# Patient Record
Sex: Male | Born: 1960 | Race: Black or African American | Hispanic: No | Smoking: Never smoker
Health system: Southern US, Community
[De-identification: ages and names within clinical notes are randomized; demographics above are authoritative.]

## PROBLEM LIST (undated history)

## (undated) DIAGNOSIS — I10 Essential (primary) hypertension: Secondary | ICD-10-CM

## (undated) DIAGNOSIS — I639 Cerebral infarction, unspecified: Secondary | ICD-10-CM

## (undated) DIAGNOSIS — F419 Anxiety disorder, unspecified: Secondary | ICD-10-CM

## (undated) DIAGNOSIS — R5381 Other malaise: Secondary | ICD-10-CM

## (undated) DIAGNOSIS — E782 Mixed hyperlipidemia: Secondary | ICD-10-CM

## (undated) DIAGNOSIS — N400 Enlarged prostate without lower urinary tract symptoms: Secondary | ICD-10-CM

## (undated) DIAGNOSIS — I619 Nontraumatic intracerebral hemorrhage, unspecified: Secondary | ICD-10-CM

## (undated) DIAGNOSIS — F0150 Vascular dementia without behavioral disturbance: Secondary | ICD-10-CM

## (undated) DIAGNOSIS — K6289 Other specified diseases of anus and rectum: Secondary | ICD-10-CM

## (undated) DIAGNOSIS — U071 COVID-19: Secondary | ICD-10-CM

## (undated) DIAGNOSIS — I81 Portal vein thrombosis: Secondary | ICD-10-CM

## (undated) DIAGNOSIS — F331 Major depressive disorder, recurrent, moderate: Secondary | ICD-10-CM

---

## 2018-09-07 ENCOUNTER — Emergency Department
Admission: EM | Admit: 2018-09-07 | Discharge: 2018-09-08 | Disposition: A | Discharge status: Home / Self Care | Payer: 59 | Attending: Emergency Medicine | Admitting: Emergency Medicine

## 2018-09-07 ENCOUNTER — Emergency Department: Payer: 59

## 2018-09-07 ENCOUNTER — Other Ambulatory Visit: Payer: Self-pay

## 2018-09-07 DIAGNOSIS — Y998 Other external cause status: Secondary | ICD-10-CM | POA: Diagnosis not present

## 2018-09-07 DIAGNOSIS — Y9301 Activity, walking, marching and hiking: Secondary | ICD-10-CM | POA: Insufficient documentation

## 2018-09-07 DIAGNOSIS — R55 Syncope and collapse: Secondary | ICD-10-CM | POA: Diagnosis not present

## 2018-09-07 DIAGNOSIS — W19XXXA Unspecified fall, initial encounter: Secondary | ICD-10-CM | POA: Diagnosis not present

## 2018-09-07 DIAGNOSIS — Y92010 Kitchen of single-family (private) house as the place of occurrence of the external cause: Secondary | ICD-10-CM | POA: Diagnosis not present

## 2018-09-07 LAB — CBC WITH DIFFERENTIAL/PLATELET
ABS IMMATURE GRANULOCYTES: 0.02 10*3/uL (ref 0.00–0.07)
BASOS PCT: 1 %
Basophils Absolute: 0.1 10*3/uL (ref 0.0–0.1)
Eosinophils Absolute: 0.7 10*3/uL — ABNORMAL HIGH (ref 0.0–0.5)
Eosinophils Relative: 10 %
HCT: 51.6 % (ref 39.0–52.0)
Hemoglobin: 17.6 g/dL — ABNORMAL HIGH (ref 13.0–17.0)
Immature Granulocytes: 0 %
LYMPHS ABS: 2.3 10*3/uL (ref 0.7–4.0)
Lymphocytes Relative: 36 %
MCH: 30.9 pg (ref 26.0–34.0)
MCHC: 34.1 g/dL (ref 30.0–36.0)
MCV: 90.7 fL (ref 80.0–100.0)
MONOS PCT: 7 %
Monocytes Absolute: 0.5 10*3/uL (ref 0.1–1.0)
NEUTROS PCT: 46 %
Neutro Abs: 3 10*3/uL (ref 1.7–7.7)
PLATELETS: 160 10*3/uL (ref 150–400)
RBC: 5.69 MIL/uL (ref 4.22–5.81)
RDW: 13.2 % (ref 11.5–15.5)
WBC: 6.4 10*3/uL (ref 4.0–10.5)
nRBC: 0 % (ref 0.0–0.2)

## 2018-09-07 LAB — COMPREHENSIVE METABOLIC PANEL
ALBUMIN: 3.6 g/dL (ref 3.5–5.0)
ALT: 25 U/L (ref 0–44)
ANION GAP: 7 (ref 5–15)
AST: 27 U/L (ref 15–41)
Alkaline Phosphatase: 63 U/L (ref 38–126)
BUN: 16 mg/dL (ref 6–20)
CHLORIDE: 106 mmol/L (ref 98–111)
CO2: 25 mmol/L (ref 22–32)
Calcium: 8.5 mg/dL — ABNORMAL LOW (ref 8.9–10.3)
Creatinine, Ser: 1.2 mg/dL (ref 0.61–1.24)
GFR calc Af Amer: >60 mL/min (ref >60)
GFR calc non Af Amer: >60 mL/min (ref >60)
GLUCOSE: 179 mg/dL — AB (ref 70–99)
POTASSIUM: 3.6 mmol/L (ref 3.5–5.1)
SODIUM: 138 mmol/L (ref 135–145)
TOTAL PROTEIN: 7.1 g/dL (ref 6.5–8.1)
Total Bilirubin: 1.8 mg/dL — ABNORMAL HIGH (ref 0.3–1.2)

## 2018-09-07 LAB — TROPONIN I

## 2018-09-07 MED ORDER — SODIUM CHLORIDE 0.9 % IV BOLUS
1000.0000 mL | Freq: Once | INTRAVENOUS | Status: AC
  Administered 2018-09-07: 1000 mL via INTRAVENOUS

## 2018-09-07 NOTE — ED Provider Notes (Signed)
[facility] Emergency Department Provider Note   ____________________________________________   First MD Initiated Contact with Patient 09/07/18 2132     (approximate)  I have reviewed the triage vital signs and the nursing notes.   HISTORY  Chief Complaint Loss of Consciousness    HPI Maxwell Miller is a 57 y.o. male who has had strokes and has both long and short-term memory loss.  He took his high blood pressure medications and about half an hour later was going to the kitchen and passed out fell hit the ground.  Patient does not remember any of it.  Patient did scrape scratches lip but does not have a headache or neck pain.  His wife reports there was a very loud bang when he fell.  She reports that he fell after he got lightheaded last week about a half an hour after taking his blood pressure medications as well.  EMS reports patient was orthostatic when they saw him.   No past medical history on file. Past medical history significant for strokes and memory loss because of that. There are no active problems to display for this patient.     Prior to Admission medications   Not on File    Allergies Patient has no known allergies.  No family history on file.  Social History Social History   Tobacco Use  . Smoking status: Not on file  Substance Use Topics  . Alcohol use: Not on file  . Drug use: Not on file    Review of Systems  Constitutional: No fever/chills Eyes: No visual changes. ENT: No sore throat. Cardiovascular: Denies chest pain. Respiratory: Denies shortness of breath. Gastrointestinal: No abdominal pain.  No nausea, no vomiting.  No diarrhea.  No constipation. Genitourinary: Negative for dysuria. Musculoskeletal: Negative for back pain. Skin: Negative for rash. Neurological: Negative for headaches, focal weakness  ____________________________________________   PHYSICAL EXAM:  VITAL SIGNS: ED Triage Vitals  Enc  Vitals Group     BP 09/07/18 2126 128/87     Pulse Rate 09/07/18 2126 69     Resp 09/07/18 2126 18     Temp 09/07/18 2126 98.8 F (37.1 C)     Temp Source 09/07/18 2126 Oral     SpO2 09/07/18 2126 99 %     Weight 09/07/18 2128 190 lb (86.2 kg)     Height 09/07/18 2128 5\' 11"  (1.803 m)     Head Circumference --      Peak Flow --      Pain Score 09/07/18 2127 6     Pain Loc --      Pain Edu? --      Excl. in GC? --     Constitutional: Alert and oriented. Well appearing and in no acute distress. Eyes: Conjunctivae are normal. PER. EOMI. Head: Atraumatic except for scratch on the right side of his lower lip. Nose: No congestion/rhinnorhea. Mouth/Throat: Mucous membranes are moist.  Oropharynx non-erythematous. Neck: No stridor.  No cervical spine tenderness to palpation. Cardiovascular: Normal rate, regular rhythm. Grossly normal heart sounds.  Good peripheral circulation. Respiratory: Normal respiratory effort.  No retractions. Lungs CTAB. Gastrointestinal: Soft and nontender. No distention. No abdominal bruits. No CVA tenderness. Musculoskeletal: No lower extremity tenderness nor edema.   Neurologic:  Normal speech and language. No gross focal neurologic deficits are appreciated.  Skin:  Skin is warm, dry and intact. No rash noted. Psychiatric: Mood and affect are normal. Speech and behavior are normal.  ____________________________________________  LABS (all labs ordered are listed, but only abnormal results are displayed)  Labs Reviewed  COMPREHENSIVE METABOLIC PANEL - Abnormal; Notable for the following components:      Result Value   Glucose, Bld 179 (*)    Calcium 8.5 (*)    Total Bilirubin 1.8 (*)    All other components within normal limits  CBC WITH DIFFERENTIAL/PLATELET - Abnormal; Notable for the following components:   Hemoglobin 17.6 (*)    Eosinophils Absolute 0.7 (*)    All other components within normal limits  TROPONIN I    ____________________________________________  EKG  EKG read interpreted by me shows normal sinus rhythm rate of 67 normal axis diffuse T wave flattening in the limb leads there is T wave inversion in V4 5 and 6 ____________________________________________  RADIOLOGY  ED MD interpretation:   Official radiology report(s): Ct Head Wo Contrast  Result Date: 09/07/2018 CLINICAL DATA:  Syncopal episode at home EXAM: CT HEAD WITHOUT CONTRAST TECHNIQUE: Contiguous axial images were obtained from the base of the skull through the vertex without intravenous contrast. COMPARISON:  None. FINDINGS: Brain: Mild superficial atrophy with moderate likely chronic small vessel ischemic disease of periventricular white matter. Probable small CSF space in the right basal ganglia. No large vascular territory infarct, hemorrhage or midline shift. No intra-axial mass or extra-axial fluid collections. Midline fourth ventricle and basal cisterns without effacement. Vascular: No hyperdense vessel sign. Skull: Normal. Negative for fracture or focal lesion. Sinuses/Orbits: No acute finding. Other: None. IMPRESSION: Mild superficial atrophy for age. Moderate degree of likely chronic small vessel ischemic disease. No acute intracranial abnormality. Electronically Signed   By: Tollie Eth M.D.   On: 09/07/2018 22:47   Dg Chest Portable 1 View  Result Date: 09/07/2018 CLINICAL DATA:  57 year old male with syncope. EXAM: PORTABLE CHEST 1 VIEW COMPARISON:  None. FINDINGS: The lungs are clear. There is no pleural effusion or pneumothorax. Top-normal cardiac silhouette. No acute osseous pathology. IMPRESSION: No active disease. Electronically Signed   By: Elgie Collard M.D.   On: 09/07/2018 22:26    ____________________________________________   PROCEDURES  Procedure(s) performed:   Procedures  Critical Care performed:   ____________________________________________   INITIAL IMPRESSION / ASSESSMENT AND PLAN /  ED COURSE Patient lab work looks okay patient is better and not orthostatic anymore after IV fluids I believe that because of the time relationship between the syncope and previous lightheadedness and his medication dosage this is a side effect of his medications.  I will have him stop the Coreg for now call his doctor first thing in the morning to see if his doctor agrees.  Patient is otherwise doing well I will let him go home she will try to see his doctor tomorrow not take the Coreg and take his blood pressure.  Clinical Course as of Sep 08 109  Mon Sep 08, 2018  0019 Chloride: 106 [PM]    Clinical Course User Index [PM] Arnaldo Natal, MD     ____________________________________________   FINAL CLINICAL IMPRESSION(S) / ED DIAGNOSES  Final diagnoses:  Syncope and collapse     ED Discharge Orders    None       Note:  This document was prepared using Dragon voice recognition software and may include unintentional dictation errors.    Arnaldo Natal, MD 09/08/18 [phone]

## 2018-09-07 NOTE — ED Notes (Signed)
Report given to Stephen RN.

## 2018-09-07 NOTE — ED Triage Notes (Signed)
Per EMS report, patient had a syncopal episode at home when he stood up to go to kitchen per wife's report. Patient has an abrasion on lower lip. Patient has a history of CVA with only deficit is memory loss. EMS reports positive orthostatic vital signs, lying: 116/78, pulse 66. Sitting: 96/67, pulse 80. Unable to obtain standing vital signs due to patient feeling faint.

## 2018-09-08 NOTE — Discharge Instructions (Addendum)
It sounds like the blood pressure medicine is making your blood pressure drop too much and making you pass out.  For now please stop taking the Coreg.  Give your doctor a call first thing in the morning and have him check on you and see if he wants to continue not taking the Coreg or he wants to stop 1 of the other 2 medicines you are taking (the Norvasc or lisinopril).  Please return here if you have lightheadedness like this again.  Please be very careful standing up for the hour or 2 after you take your blood pressure medicine.

## 2019-01-20 ENCOUNTER — Encounter: Payer: Self-pay | Admitting: Emergency Medicine

## 2019-01-20 ENCOUNTER — Inpatient Hospital Stay
Admission: EM | Admit: 2019-01-20 | Discharge: 2019-04-01 | DRG: 871 | Disposition: A | Discharge status: Skilled Nursing Facility | Payer: Medicaid Other | Attending: Internal Medicine | Admitting: Internal Medicine

## 2019-01-20 ENCOUNTER — Other Ambulatory Visit: Payer: Self-pay

## 2019-01-20 ENCOUNTER — Emergency Department: Payer: Medicaid Other

## 2019-01-20 DIAGNOSIS — Z1159 Encounter for screening for other viral diseases: Secondary | ICD-10-CM | POA: Diagnosis not present

## 2019-01-20 DIAGNOSIS — I10 Essential (primary) hypertension: Secondary | ICD-10-CM | POA: Diagnosis present

## 2019-01-20 DIAGNOSIS — F0150 Vascular dementia without behavioral disturbance: Secondary | ICD-10-CM | POA: Diagnosis present

## 2019-01-20 DIAGNOSIS — Z6825 Body mass index (BMI) 25.0-25.9, adult: Secondary | ICD-10-CM

## 2019-01-20 DIAGNOSIS — I81 Portal vein thrombosis: Secondary | ICD-10-CM | POA: Diagnosis present

## 2019-01-20 DIAGNOSIS — E440 Moderate protein-calorie malnutrition: Secondary | ICD-10-CM

## 2019-01-20 DIAGNOSIS — Z8673 Personal history of transient ischemic attack (TIA), and cerebral infarction without residual deficits: Secondary | ICD-10-CM

## 2019-01-20 DIAGNOSIS — E785 Hyperlipidemia, unspecified: Secondary | ICD-10-CM | POA: Diagnosis present

## 2019-01-20 DIAGNOSIS — A419 Sepsis, unspecified organism: Secondary | ICD-10-CM | POA: Diagnosis present

## 2019-01-20 DIAGNOSIS — Z9119 Patient's noncompliance with other medical treatment and regimen: Secondary | ICD-10-CM | POA: Diagnosis not present

## 2019-01-20 DIAGNOSIS — A4151 Sepsis due to Escherichia coli [E. coli]: Principal | ICD-10-CM | POA: Diagnosis present

## 2019-01-20 DIAGNOSIS — G8311 Monoplegia of lower limb affecting right dominant side: Secondary | ICD-10-CM | POA: Diagnosis not present

## 2019-01-20 DIAGNOSIS — R4182 Altered mental status, unspecified: Secondary | ICD-10-CM | POA: Diagnosis present

## 2019-01-20 DIAGNOSIS — K6289 Other specified diseases of anus and rectum: Secondary | ICD-10-CM | POA: Diagnosis present

## 2019-01-20 DIAGNOSIS — F329 Major depressive disorder, single episode, unspecified: Secondary | ICD-10-CM | POA: Diagnosis not present

## 2019-01-20 HISTORY — DX: Essential (primary) hypertension: I10

## 2019-01-20 HISTORY — DX: Cerebral infarction, unspecified: I63.9

## 2019-01-20 LAB — PATHOLOGIST SMEAR REVIEW

## 2019-01-20 LAB — CBC WITH DIFFERENTIAL/PLATELET
ABS IMMATURE GRANULOCYTES: 1.83 10*3/uL — AB (ref 0.00–0.07)
Basophils Absolute: 0.1 10*3/uL (ref 0.0–0.1)
Basophils Relative: 0 %
Eosinophils Absolute: 0 10*3/uL (ref 0.0–0.5)
Eosinophils Relative: 0 %
HCT: 47 % (ref 39.0–52.0)
Hemoglobin: 16 g/dL (ref 13.0–17.0)
IMMATURE GRANULOCYTES: 6 %
Lymphocytes Relative: 4 %
Lymphs Abs: 1.1 10*3/uL (ref 0.7–4.0)
MCH: 29.5 pg (ref 26.0–34.0)
MCHC: 34 g/dL (ref 30.0–36.0)
MCV: 86.7 fL (ref 80.0–100.0)
MONOS PCT: 8 %
Monocytes Absolute: 2.4 10*3/uL — ABNORMAL HIGH (ref 0.1–1.0)
NEUTROS ABS: 23.8 10*3/uL — AB (ref 1.7–7.7)
NEUTROS PCT: 82 %
PLATELETS: 136 10*3/uL — AB (ref 150–400)
RBC: 5.42 MIL/uL (ref 4.22–5.81)
RDW: 13.7 % (ref 11.5–15.5)
Smear Review: NORMAL
WBC: 29.4 10*3/uL — ABNORMAL HIGH (ref 4.0–10.5)
nRBC: 0 % (ref 0.0–0.2)

## 2019-01-20 LAB — CSF CELL COUNT WITH DIFFERENTIAL
Eosinophils, CSF: 0 %
Lymphs, CSF: 46 %
Monocyte-Macrophage-Spinal Fluid: 10 %
RBC Count, CSF: 212 /mm3 — ABNORMAL HIGH (ref 0–3)
RBC Count, CSF: 3418 /mm3 — ABNORMAL HIGH (ref 0–3)
Segmented Neutrophils-CSF: 44 %
TUBE #: 1
Tube #: 4
WBC, CSF: 0 /mm3 (ref 0–5)
WBC, CSF: 17 /mm3 (ref 0–5)

## 2019-01-20 LAB — INFLUENZA PANEL BY PCR (TYPE A & B)
Influenza A By PCR: NEGATIVE
Influenza B By PCR: NEGATIVE

## 2019-01-20 LAB — COMPREHENSIVE METABOLIC PANEL
ALK PHOS: 80 U/L (ref 38–126)
ALT: 18 U/L (ref 0–44)
ANION GAP: 11 (ref 5–15)
AST: 21 U/L (ref 15–41)
Albumin: 3.5 g/dL (ref 3.5–5.0)
BUN: 22 mg/dL — AB (ref 6–20)
CALCIUM: 8.9 mg/dL (ref 8.9–10.3)
CO2: 22 mmol/L (ref 22–32)
Chloride: 101 mmol/L (ref 98–111)
Creatinine, Ser: 1.31 mg/dL — ABNORMAL HIGH (ref 0.61–1.24)
GFR calc Af Amer: >60 mL/min (ref >60)
GFR calc non Af Amer: >60 mL/min (ref >60)
Glucose, Bld: 172 mg/dL — ABNORMAL HIGH (ref 70–99)
POTASSIUM: 3.9 mmol/L (ref 3.5–5.1)
SODIUM: 134 mmol/L — AB (ref 135–145)
Total Bilirubin: 3 mg/dL — ABNORMAL HIGH (ref 0.3–1.2)
Total Protein: 8 g/dL (ref 6.5–8.1)

## 2019-01-20 LAB — URINALYSIS, ROUTINE W REFLEX MICROSCOPIC
BILIRUBIN URINE: NEGATIVE
Bacteria, UA: NONE SEEN
Glucose, UA: NEGATIVE mg/dL
Ketones, ur: 5 mg/dL — AB
LEUKOCYTE UA: NEGATIVE
Nitrite: NEGATIVE
Protein, ur: 30 mg/dL — AB
SPECIFIC GRAVITY, URINE: 1.025 (ref 1.005–1.030)
pH: 5 (ref 5.0–8.0)

## 2019-01-20 LAB — CBC
HCT: 43.3 % (ref 39.0–52.0)
Hemoglobin: 14.4 g/dL (ref 13.0–17.0)
MCH: 29.5 pg (ref 26.0–34.0)
MCHC: 33.3 g/dL (ref 30.0–36.0)
MCV: 88.7 fL (ref 80.0–100.0)
Platelets: 128 10*3/uL — ABNORMAL LOW (ref 150–400)
RBC: 4.88 MIL/uL (ref 4.22–5.81)
RDW: 13.4 % (ref 11.5–15.5)
WBC: 27.6 10*3/uL — ABNORMAL HIGH (ref 4.0–10.5)

## 2019-01-20 LAB — LACTIC ACID, PLASMA
Lactic Acid, Venous: 1.8 mmol/L (ref 0.5–1.9)
Lactic Acid, Venous: 1.5 mmol/L (ref 0.5–1.9)

## 2019-01-20 LAB — CREATININE, SERUM
CREATININE: 1.2 mg/dL (ref 0.61–1.24)
GFR calc Af Amer: >60 mL/min (ref >60)
GFR calc non Af Amer: >60 mL/min (ref >60)

## 2019-01-20 LAB — PROTEIN AND GLUCOSE, CSF
Glucose, CSF: 92 mg/dL — ABNORMAL HIGH (ref 40–70)
Total  Protein, CSF: 63 mg/dL — ABNORMAL HIGH (ref 15–45)

## 2019-01-20 LAB — GLUCOSE, CAPILLARY: GLUCOSE-CAPILLARY: 150 mg/dL — AB (ref 70–99)

## 2019-01-20 LAB — MRSA PCR SCREENING: MRSA by PCR: NEGATIVE

## 2019-01-20 MED ORDER — ONDANSETRON HCL 4 MG/2ML IJ SOLN: 4.0000 mg | Freq: Four times a day (QID) | INTRAMUSCULAR | Status: DC | PRN

## 2019-01-20 MED ORDER — ENOXAPARIN SODIUM 40 MG/0.4ML ~~LOC~~ SOLN
40.0000 mg | SUBCUTANEOUS | Status: DC
  Administered 2019-01-20 – 2019-01-21 (×2): 40 mg via SUBCUTANEOUS
  Filled 2019-01-20 (×2): qty 0.4

## 2019-01-20 MED ORDER — VANCOMYCIN HCL IN DEXTROSE 1-5 GM/200ML-% IV SOLN
1000.0000 mg | Freq: Once | INTRAVENOUS | Status: AC
  Administered 2019-01-20: 1000 mg via INTRAVENOUS
  Filled 2019-01-20: qty 200

## 2019-01-20 MED ORDER — SODIUM CHLORIDE 0.9 % IV SOLN
1.0000 g | Freq: Once | INTRAVENOUS | Status: AC
  Administered 2019-01-20: 1 g via INTRAVENOUS
  Filled 2019-01-20: qty 1

## 2019-01-20 MED ORDER — HYDRALAZINE HCL 20 MG/ML IJ SOLN: 10.0000 mg | Freq: Four times a day (QID) | INTRAMUSCULAR | Status: DC | PRN

## 2019-01-20 MED ORDER — ACETAMINOPHEN 325 MG PO TABS
650.0000 mg | ORAL_TABLET | Freq: Four times a day (QID) | ORAL | Status: DC | PRN
  Administered 2019-01-20 – 2019-02-25 (×4): 650 mg via ORAL
  Filled 2019-01-20 (×4): qty 2

## 2019-01-20 MED ORDER — SODIUM CHLORIDE 0.9 % IV SOLN
INTRAVENOUS | Status: DC
  Administered 2019-01-20 – 2019-01-23 (×5): via INTRAVENOUS

## 2019-01-20 MED ORDER — SODIUM CHLORIDE 0.9 % IV SOLN
2.0000 g | Freq: Three times a day (TID) | INTRAVENOUS | Status: DC
  Administered 2019-01-20 – 2019-01-21 (×2): 2 g via INTRAVENOUS
  Filled 2019-01-20 (×5): qty 2

## 2019-01-20 MED ORDER — ACETAMINOPHEN 500 MG PO TABS
1000.0000 mg | ORAL_TABLET | Freq: Once | ORAL | Status: AC
  Administered 2019-01-20: 1000 mg via ORAL
  Filled 2019-01-20: qty 2

## 2019-01-20 MED ORDER — HYDROCODONE-ACETAMINOPHEN 5-325 MG PO TABS: 1.0000 | ORAL_TABLET | ORAL | Status: DC | PRN

## 2019-01-20 MED ORDER — SODIUM CHLORIDE 0.9 % IV BOLUS
1000.0000 mL | Freq: Once | INTRAVENOUS | Status: AC
  Administered 2019-01-20: 1000 mL via INTRAVENOUS

## 2019-01-20 MED ORDER — VANCOMYCIN HCL 10 G IV SOLR
1750.0000 mg | INTRAVENOUS | Status: DC
  Filled 2019-01-20: qty 1750

## 2019-01-20 MED ORDER — ACETAMINOPHEN 650 MG RE SUPP: 650.0000 mg | Freq: Four times a day (QID) | RECTAL | Status: DC | PRN

## 2019-01-20 MED ORDER — LIDOCAINE HCL (PF) 1 % IJ SOLN
INTRAMUSCULAR | Status: AC
  Administered 2019-01-20: 13:00:00
  Filled 2019-01-20: qty 5

## 2019-01-20 MED ORDER — POLYETHYLENE GLYCOL 3350 17 G PO PACK
17.0000 g | PACK | Freq: Every day | ORAL | Status: DC | PRN
  Administered 2019-01-22 – 2019-01-30 (×5): 17 g via ORAL
  Filled 2019-01-20 (×5): qty 1

## 2019-01-20 MED ORDER — ONDANSETRON HCL 4 MG PO TABS: 4.0000 mg | ORAL_TABLET | Freq: Four times a day (QID) | ORAL | Status: DC | PRN

## 2019-01-20 NOTE — Ethics Note (Signed)
Ethics Committee Consult  Date: 01/20/2019 Time: 2:56 PM  Primary Committee Member:  Maxwell Copas RN  Does the patient have decision making capacity?  Yes  Does the patient have an Scientist, water quality or Health Care Power of Attorney?  Yes  Legal Decision Maker: Maxwell Miller Relationship to Patient: Ex-wife   Name and contact information for person who requested the consult:  Dr. Nita Sickle  Attending Physician:  Maxwell Saran, MD   Reason for the consult / ethical problem (eg. Goals of care, values clarification, forum for discussion, conflict resolution, autonomy, beneficence, etc.):  Goals of Care  Preliminary Information:  Dr. Don Perking concerned that Maxwell Miller ex wife wants to take him home even after explaining he will most likely die if left untreated.  Objective facts related to or contributing to the problem (eg.  Pertinent medical history, facts of the case): Patients ex wife is refusing admission to the hospital on the grounds that the patient has no health insurance and she is unable to pay for the visit. Maxwell Miller was asked if he understood the seriousness of his condition and if he wanted to go home or be admitted and he answered "Yes I want to be admitted, why else would people come to the hospital if they will die at home".    Summary of consultation and recommendations:   I discussed with Dr Don Perking that if the patient understands the situation of his condition and wants treatment that his is HCPOA can not override that decision.  We discussed APS, I recommend to admit patient into the hospital to treat his sepsis and once clinically improved to discuss with Maxwell Miller if he feels safe returning with his ex wife as care provider.

## 2019-01-20 NOTE — Progress Notes (Signed)
CODE SEPSIS - PHARMACY COMMUNICATION  **Broad Spectrum Antibiotics should be administered within 1 hour of Sepsis diagnosis**  Time Code Sepsis Called/Page Received: 1028  Antibiotics Ordered: cefepime/vancomycin   Time of 1st antibiotic administration: 1043 Cefepime   Additional action taken by pharmacy: N/A  If necessary, Name of Provider/Nurse Contacted: N/A    Yuri Flener L ,PharmD Clinical Pharmacist  01/20/2019  10:41 AM

## 2019-01-20 NOTE — ED Notes (Signed)
First Nurse Note: Patient assisted from POV into WC, diaphoretic.  Ex-Wife states patient has had fever and incontinence during the night. Hx of CVA.

## 2019-01-20 NOTE — Progress Notes (Signed)
MEWs of 5.  Gave 650 mg tylenol for temp of 103.4.  Text paged Dr Tobi Bastos with new information seeking new orders. Henriette Combs RN

## 2019-01-20 NOTE — ED Triage Notes (Signed)
Patient presents to ED via POV with ex wife from home due to altered mental status. Last known well was yesterday at 0700. Ex wife reports history of stoke with baseline "generalized confusion". She reports patient can normally converse with other and care for himself. Last night she noticed urinary incontinence and generalized weakness. Tachycardiac in triage with a rate of 124. Patient is diaphoretic.

## 2019-01-20 NOTE — ED Provider Notes (Signed)
[facility] Emergency Department Provider Note  ____________________________________________  Time seen: Approximately 9:14 AM  I have reviewed the triage vital signs and the nursing notes.   HISTORY  Chief Complaint Altered Mental Status   HPI Maxwell Miller is a 58 y.o. male with a history of stroke, hypertension, memory loss who presents for evaluation of altered mental status.  History is gathered mostly from patient's ex-wife who is his healthcare power of attorney and lives with the patient.  According to her patient has been complaining of generalized weakness since yesterday with decreased oral intake.  She noted that overnight he had 3 episodes of urinary incontinence.  He usually has no incontinence at baseline.  This morning he was slightly more confused than baseline.  Patient does all his ADLs independently.  She noted a fever this morning.  No vomiting or diarrhea.  Patient denies headache, chest pain, abdominal pain, shortness of breath, cough, nausea, vomiting or diarrhea.  Past Medical History:  Diagnosis Date  . Hypertension   . Stroke [facility])    Allergies Patient has no known allergies.  No family history on file.  Social History Social History   Tobacco Use  . Smoking status: Never Smoker  Substance Use Topics  . Alcohol use: Never    Frequency: Never  . Drug use: Never    Review of Systems  Constitutional: + fever, confusion Eyes: Negative for visual changes. ENT: Negative for sore throat. Neck: No neck pain  Cardiovascular: Negative for chest pain. Respiratory: Negative for shortness of breath. Gastrointestinal: Negative for abdominal pain, vomiting or diarrhea. Genitourinary: Negative for dysuria. + incontinence Musculoskeletal: Negative for back pain. Skin: Negative for rash. Neurological: Negative for headaches, weakness or numbness. Psych: No SI or HI  ____________________________________________   PHYSICAL  EXAM:  VITAL SIGNS: ED Triage Vitals [01/20/19 0826]  Enc Vitals Group     BP 106/62     Pulse Rate (!) 124     Resp 20     Temp (!) 100.7 F (38.2 C)     Temp Source Oral     SpO2 97 %     Weight 180 lb (81.6 kg)     Height  (1.803 m)     Head Circumference      Peak Flow      Pain Score 0     Pain Loc      Pain Edu?      Excl. in GC?     Constitutional: Alert and oriented x 2, no distress.  HEENT:      Head: Normocephalic and atraumatic.         Eyes: Conjunctivae are normal. Sclera is non-icteric.       Mouth/Throat: Mucous membranes are moist.       Neck: Supple with no signs of meningismus. Cardiovascular: Tachycardic with regular rhythm. No murmurs, gallops, or rubs. 2+ symmetrical distal pulses are present in all extremities. No JVD. Respiratory: Normal respiratory effort. Lungs are clear to auscultation bilaterally. No wheezes, crackles, or rhonchi.  Gastrointestinal: Soft, non tender, and non distended with positive bowel sounds. No rebound or guarding. Musculoskeletal: Nontender with normal range of motion in all extremities. No edema, cyanosis, or erythema of extremities. Neurologic: Soft spoken but normal language. Face is symmetric.  Intact strength and sensation x4 Skin: Skin is warm, dry and intact. No rash noted. Psychiatric: Mood and affect are normal. Speech and behavior are normal.  ____________________________________________   LABS (all labs ordered  are listed, but only abnormal results are displayed)  Labs Reviewed  GLUCOSE, CAPILLARY - Abnormal; Notable for the following components:      Result Value   Glucose-Capillary 150 (*)    All other components within normal limits  COMPREHENSIVE METABOLIC PANEL - Abnormal; Notable for the following components:   Sodium 134 (*)    Glucose, Bld 172 (*)    BUN 22 (*)    Creatinine, Ser 1.31 (*)    Total Bilirubin 3.0 (*)    All other components within normal limits  CBC WITH DIFFERENTIAL/PLATELET  - Abnormal; Notable for the following components:   WBC 29.4 (*)    Platelets 136 (*)    Neutro Abs 23.8 (*)    Monocytes Absolute 2.4 (*)    Abs Immature Granulocytes 1.83 (*)    All other components within normal limits  URINALYSIS, ROUTINE W REFLEX MICROSCOPIC - Abnormal; Notable for the following components:   Color, Urine AMBER (*)    APPearance HAZY (*)    Hgb urine dipstick MODERATE (*)    Ketones, ur 5 (*)    Protein, ur 30 (*)    All other components within normal limits  CBC - Abnormal; Notable for the following components:   Platelets 128 (*)    All other components within normal limits  CULTURE, BLOOD (ROUTINE X 2)  CULTURE, BLOOD (ROUTINE X 2)  URINE CULTURE  MRSA PCR SCREENING  CSF CULTURE  GRAM STAIN  LACTIC ACID, PLASMA  LACTIC ACID, PLASMA  INFLUENZA PANEL BY PCR (TYPE A & B)  CREATININE, SERUM  HIV ANTIBODY (ROUTINE TESTING W REFLEX)  CSF CELL COUNT WITH DIFFERENTIAL  CSF CELL COUNT WITH DIFFERENTIAL  PROTEIN AND GLUCOSE, CSF   ____________________________________________  EKG  ED ECG REPORT I, Nita Sickle, the attending physician, personally viewed and interpreted this ECG.  Sinus tachycardia, rate of 129, normal intervals, normal axis, diffuse T wave inversions with no ST elevation.  T wave inversions are deeper when compared to prior but not new peer  ____________________________________________  RADIOLOGY  I have personally reviewed the images performed during this visit and I agree with the Radiologist's read.   Interpretation by Radiologist:  Ct Head Wo Contrast  Result Date: 01/20/2019 CLINICAL DATA:  Altered mental status/confusion EXAM: CT HEAD WITHOUT CONTRAST TECHNIQUE: Contiguous axial images were obtained from the base of the skull through the vertex without intravenous contrast. COMPARISON:  September 07, 2018 FINDINGS: Brain: There is mild diffuse atrophy, stable. There is no intracranial mass, hemorrhage, extra-axial fluid  collection, midline shift. There is small vessel disease throughout portions of the centra semiovale bilaterally, stable. There is small vessel disease in each internal capsule, slightly more severe on the right than on the left, stable. No acute appearing infarct is evident. Vascular: There is no appreciable hyperdense vessel. There is no appreciable vascular calcification. Skull: The bony calvarium appears intact. Sinuses/Orbits: There is mucosal thickening in each superior maxillary antrum and several ethmoid air cells. Visualized orbits appear symmetric bilaterally. Other: Mastoid air cells are clear. IMPRESSION: Stable atrophy with patchy supratentorial small vessel disease which appear stable. No acute infarct evident. No mass or hemorrhage. There is a mild degree of paranasal sinus disease. Electronically Signed   By: Bretta Bang III M.D.   On: 01/20/2019 11:23   Dg Chest Port 1 View  Result Date: 01/20/2019 CLINICAL DATA:  58 year old male with fever for 2-3 days EXAM: PORTABLE CHEST 1 VIEW COMPARISON:  09/07/2018 FINDINGS: Cardiomediastinal silhouette unchanged  in size and contour. No pneumothorax. No pleural effusion. Coarsened interstitial markings similar to the comparison. No confluent airspace disease. No interlobular septal thickening. No displaced fracture. IMPRESSION: Chronic lung changes without evidence of superimposed acute cardiopulmonary disease. Electronically Signed   By: Gilmer Mor D.O.   On: 01/20/2019 10:01      ____________________________________________   PROCEDURES  Procedure(s) performed: None Procedures Critical Care performed: yes  CRITICAL CARE Performed by: Nita Sickle  ?  Total critical care time: 35 min  Critical care time was exclusive of separately billable procedures and treating other patients.  Critical care was necessary to treat or prevent imminent or life-threatening deterioration.  Critical care was time spent personally by me  on the following activities: development of treatment plan with patient and/or surrogate as well as nursing, discussions with consultants, evaluation of patient's response to treatment, examination of patient, obtaining history from patient or surrogate, ordering and performing treatments and interventions, ordering and review of laboratory studies, ordering and review of radiographic studies, pulse oximetry and re-evaluation of patient's condition.  ____________________________________________   INITIAL IMPRESSION / ASSESSMENT AND PLAN / ED COURSE  58 y.o. male with a history of stroke, hypertension, memory loss who presents for evaluation of generalized weakness, confusion, and fever.  Patient is tachycardic with a pulse of 124, low-grade fever of 100.7, BP is soft with systolics in the 90s.  Otherwise is grossly neurologically intact with a soft nontender abdomen, clear lungs, no obvious rashes.  Differential diagnoses including sepsis possibly from urinary source, dehydration, AKI.  Patient has no respiratory symptoms.  Has no abdominal or GI symptoms.  We will give IV fluids, Tylenol, check labs, urinalysis and chest x-ray.  Clinical Course as of Jan 19 1326  Tue Jan 20, 2019  1201 Patient's labs showing white count of 29, negative influenza, negative urinalysis, normal lactic acid, normal head CT and chest x-ray.  No obvious source of infection.  With an elevated white count and left shift in the setting of sepsis I recommended LP to rule out meningitis and admission for IV abx until cultures are back to rule out bacteremia or meningitis.  I discussed with patient's ex-wife who is his healthcare power of attorney.  She is refusing LP and admission to the hospital on the grounds that the patient has no health insurance and she is unable to pay for the visit.  I have consulted social worker to help assist her with applying for Medicare for the patient.  I explained the mortality rate associated  with sepsis.  Patient's healthcare power of attorney understands the risks of taking patient out of the hospital AGAINST MEDICAL ADVICE without at least 24 hours of observation and result of blood cultures.  At this time I have requested that she show's prove that she is his legal healthcare power of attorney.  She is going home to get the documents.  In the meantime patient continues to receive fluids with improvement of his heart rate and blood pressure.  He has received a dose of cefepime and vancomycin.  We will continue to monitor patient closely.   [CV]  1222 After patient's ex-wife went home I went back in the room to talk to the patient.  I explained 1 more time my concerns that he has current sepsis and borderline septic shock.  I explained to him the extremely high mortality associated with this. I asked the patient if he understood the seriousness of his condition and if he wanted to  go home or be admitted and he answered "Yes I want to be admitted, why else would people come to the hospital if they will die at home". I then spoke Ms. Irving Copas from the ethics department who instructed me to follow patient's wishes as long as patient seems capable of making medical decisions per my clinical evaluation. I do believe patient has capacity to make his own medical decisions and therefore I will discuss with the hospitalist for admission.  She recommended against getting APS involved at this time until patient is cleared from his infection.  At that time if patient feels safe to go home with her no APS involvement will be necessary.   [CV]  1326 LP performed per procedure note above. Patient consented to it. Patient has a bed assigned. CSF studies pending   [CV]    Clinical Course User Index [CV] Don Perking [place], MD     As part of my medical decision making, I reviewed the following data within the electronic MEDICAL RECORD NUMBER History obtained from family, Nursing notes reviewed and  incorporated, Labs reviewed , EKG interpreted , Old EKG reviewed, Old chart reviewed, Radiograph reviewed , Discussed with admitting physician , A consult was requested and obtained from this/these consultant(s) Ethics, Notes from prior ED visits and [place] Controlled Substance Database    Pertinent labs & imaging results that were available during my care of the patient were reviewed by me and considered in my medical decision making (see chart for details).    ____________________________________________   FINAL CLINICAL IMPRESSION(S) / ED DIAGNOSES  Final diagnoses:  Sepsis without acute organ dysfunction, due to unspecified organism [facility])      NEW MEDICATIONS STARTED DURING THIS VISIT:  ED Discharge Orders    None       Note:  This document was prepared using Dragon voice recognition software and may include unintentional dictation errors.    Don Perking, [place], MD 01/20/19 1328

## 2019-01-20 NOTE — Progress Notes (Signed)
Pharmacy Antibiotic Note  Maxwell Miller is a 58 y.o. male admitted on 01/20/2019 with sepsis.  Pharmacy has been consulted for cefepime and vancomycin dosing.  Plan: Patient received vancomycin 1000 mg x 1 in the ED. Will order another 1000 mg dose to make a total of 2000 mg loading dose followed by a maintenance dose of 1750 mg q24H. Plan to order levels in 4-5 days. Monitor Scr.  Goal AUC 400-550. Expected AUC: 501.5 SCr used: 1.31  Will order cefepime 2 g q8H.    Height: 5\' 11"  (180.3 cm) Weight: 180 lb (81.6 kg) IBW/kg (Calculated) : 75.3  Temp (24hrs), Avg:99.9 F (37.7 C), Min:99.1 F (37.3 C), Max:100.7 F (38.2 C)  Recent Labs  Lab 01/20/19 0843 01/20/19 1045  WBC 29.4*  --   CREATININE 1.31*  --   LATICACIDVEN 1.8 1.5    Estimated Creatinine Clearance: 66.3 mL/min (A) (by C-G formula based on SCr of 1.31 mg/dL (H)).    No Known Allergies  Antimicrobials this admission: 3/10 cefepime >>  3/10 vancomycin >>   Dose adjustments this admission: None  Microbiology results: 3/10 BCx: pendin 3/10 UCx: pending    Thank you for allowing pharmacy to be a part of this patient's care.  Ronnald Ramp, PharmD, BCPS Clinical Pharmacist  01/20/2019 12:44 PM

## 2019-01-20 NOTE — ED Notes (Signed)
ED TO INPATIENT HANDOFF REPORT  ED Nurse Name and Phone #: Jae Dire [phone]  S Name/Age/Gender [name] 58 y.o. male Room/Bed: ED18A/ED18A  Code Status   Code Status: Full Code  Home/SNF/Other Home Patient oriented to: self, place, time and situation Is this baseline? Yes   Triage Complete: Triage complete  Chief Complaint ams;fever  Triage Note Patient presents to ED via POV with ex wife from home due to altered mental status. Last known well was yesterday at 0700. Ex wife reports history of stoke with baseline "generalized confusion". She reports patient can normally converse with other and care for himself. Last night she noticed urinary incontinence and generalized weakness. Tachycardiac in triage with a rate of 124. Patient is diaphoretic.     Allergies No Known Allergies  Level of Care/Admitting Diagnosis ED Disposition    ED Disposition Condition Comment   Admit  Hospital Area: [facility] [[id number]]  Level of Care: Med-Surg [16]  Diagnosis: Sepsis [facility]) [[id number]]  Admitting Physician: Adrian Saran [[id number]]  Attending Physician: MODY, Patricia Pesa [[id number]]  Estimated length of stay: 3 - 4 days  Certification:: I certify this patient will need inpatient services for at least 2 midnights  PT Class (Do Not Modify): Inpatient [101]  PT Acc Code (Do Not Modify): Private [1]       B Medical/Surgery History Past Medical History:  Diagnosis Date  . Hypertension   . Stroke [facility])    History reviewed. No pertinent surgical history.   A IV Location/Drains/Wounds Patient Lines/Drains/Airways Status   Active Line/Drains/Airways    Name:   Placement date:   Placement time:   Site:   Days:   Peripheral IV 01/20/19 Right Antecubital   01/20/19    0846    Antecubital   less than 1   Peripheral IV 01/20/19 Left Forearm   01/20/19    0919    Forearm   less than 1          Intake/Output Last 24 hours  Intake/Output Summary (Last 24 hours) at  01/20/2019 1342 Last data filed at 01/20/2019 1012 Gross per 24 hour  Intake -  Output 30 ml  Net -30 ml    Labs/Imaging Results for orders placed or performed during the hospital encounter of 01/20/19 (from the past 48 hour(s))  Glucose, capillary     Status: Abnormal   Collection Time: 01/20/19  8:40 AM  Result Value Ref Range   Glucose-Capillary 150 (H) 70 - 99 mg/dL  Lactic acid, plasma     Status: None   Collection Time: 01/20/19  8:43 AM  Result Value Ref Range   Lactic Acid, Venous 1.8 0.5 - 1.9 mmol/L    Comment: Performed at [facility], [address] [place], [place] [address]  Comprehensive metabolic panel     Status: Abnormal   Collection Time: 01/20/19  8:43 AM  Result Value Ref Range   Sodium 134 (L) 135 - 145 mmol/L   Potassium 3.9 3.5 - 5.1 mmol/L   Chloride 101 98 - 111 mmol/L   CO2 22 22 - 32 mmol/L   Glucose, Bld 172 (H) 70 - 99 mg/dL   BUN 22 (H) 6 - 20 mg/dL   Creatinine, Ser 7.84 (H) 0.61 - 1.24 mg/dL   Calcium 8.9 8.9 - 69.6 mg/dL   Total Protein 8.0 6.5 - 8.1 g/dL   Albumin 3.5 3.5 - 5.0 g/dL   AST 21 15 - 41 U/L   ALT 18 0 -  44 U/L   Alkaline Phosphatase 80 38 - 126 U/L   Total Bilirubin 3.0 (H) 0.3 - 1.2 mg/dL   GFR calc non Af Amer >60 >60 mL/min   GFR calc Af Amer >60 >60 mL/min   Anion gap 11 5 - 15    Comment: Performed at [facility], [address] [place], [place] [address]  CBC WITH DIFFERENTIAL     Status: Abnormal   Collection Time: 01/20/19  8:43 AM  Result Value Ref Range   WBC 29.4 (H) 4.0 - 10.5 K/uL   RBC 5.42 4.22 - 5.81 MIL/uL   Hemoglobin 16.0 13.0 - 17.0 g/dL   HCT 60.4 54.0 - 98.1 %   MCV 86.7 80.0 - 100.0 fL   MCH 29.5 26.0 - 34.0 pg   MCHC 34.0 30.0 - 36.0 g/dL   RDW 19.1 47.8 - 29.5 %   Platelets 136 (L) 150 - 400 K/uL   nRBC 0.0 0.0 - 0.2 %   Neutrophils Relative % 82 %   Neutro Abs 23.8 (H) 1.7 - 7.7 K/uL   Lymphocytes Relative 4 %   Lymphs Abs 1.1 0.7 - 4.0 K/uL   Monocytes Relative 8 %    Monocytes Absolute 2.4 (H) 0.1 - 1.0 K/uL   Eosinophils Relative 0 %   Eosinophils Absolute 0.0 0.0 - 0.5 K/uL   Basophils Relative 0 %   Basophils Absolute 0.1 0.0 - 0.1 K/uL   WBC Morphology MORPHOLOGY UNREMARKABLE    RBC Morphology MORPHOLOGY UNREMARKABLE    Smear Review Normal platelet morphology    Immature Granulocytes 6 %   Abs Immature Granulocytes 1.83 (H) 0.00 - 0.07 K/uL    Comment: Performed at [facility], [address] [place], [place] [address]  Urinalysis, Routine w reflex microscopic     Status: Abnormal   Collection Time: 01/20/19  9:58 AM  Result Value Ref Range   Color, Urine AMBER (A) YELLOW    Comment: BIOCHEMICALS MAY BE AFFECTED BY COLOR   APPearance HAZY (A) CLEAR   Specific Gravity, Urine 1.025 1.005 - 1.030   pH 5.0 5.0 - 8.0   Glucose, UA NEGATIVE NEGATIVE mg/dL   Hgb urine dipstick MODERATE (A) NEGATIVE   Bilirubin Urine NEGATIVE NEGATIVE   Ketones, ur 5 (A) NEGATIVE mg/dL   Protein, ur 30 (A) NEGATIVE mg/dL   Nitrite NEGATIVE NEGATIVE   Leukocytes,Ua NEGATIVE NEGATIVE   RBC / HPF 0-5 0 - 5 RBC/hpf   WBC, UA 0-5 0 - 5 WBC/hpf   Bacteria, UA NONE SEEN NONE SEEN   Squamous Epithelial / LPF 0-5 0 - 5   Mucus PRESENT    Sperm, UA PRESENT     Comment: Performed at [facility], [address] [place], [place] [address]  Lactic acid, plasma     Status: None   Collection Time: 01/20/19 10:45 AM  Result Value Ref Range   Lactic Acid, Venous 1.5 0.5 - 1.9 mmol/L    Comment: Performed at [facility], [address] [place], [place] [address]  Influenza panel by PCR (type A & B)     Status: None   Collection Time: 01/20/19 10:45 AM  Result Value Ref Range   Influenza A By PCR NEGATIVE NEGATIVE   Influenza B By PCR NEGATIVE NEGATIVE    Comment: (NOTE) The Xpert Xpress Flu assay is intended as an aid in the diagnosis of  influenza and should not be used as a sole basis for  treatment.  This  assay is FDA approved for  nasopharyngeal swab specimens only. Nasal  washings and aspirates are unacceptable for Xpert Xpress Flu testing. Performed at [facility], [address] [place], [place] [address]   CBC     Status: Abnormal (Preliminary result)   Collection Time: 01/20/19 12:45 PM  Result Value Ref Range   WBC PENDING 4.0 - 10.5 K/uL   RBC 4.88 4.22 - 5.81 MIL/uL   Hemoglobin 14.4 13.0 - 17.0 g/dL   HCT 40.1 02.7 - 25.3 %   MCV 88.7 80.0 - 100.0 fL   MCH 29.5 26.0 - 34.0 pg   MCHC 33.3 30.0 - 36.0 g/dL   RDW 66.4 40.3 - 47.4 %   Platelets 128 (L) 150 - 400 K/uL    Comment: Performed at [facility], [address] [place], [place] [address]   nRBC PENDING 0.0 - 0.2 %  Creatinine, serum     Status: None   Collection Time: 01/20/19 12:45 PM  Result Value Ref Range   Creatinine, Ser 1.20 0.61 - 1.24 mg/dL   GFR calc non Af Amer >60 >60 mL/min   GFR calc Af Amer >60 >60 mL/min    Comment: Performed at [facility], [address] [place], [place] [address]   Ct Head Wo Contrast  Result Date: 01/20/2019 CLINICAL DATA:  Altered mental status/confusion EXAM: CT HEAD WITHOUT CONTRAST TECHNIQUE: Contiguous axial images were obtained from the base of the skull through the vertex without intravenous contrast. COMPARISON:  September 07, 2018 FINDINGS: Brain: There is mild diffuse atrophy, stable. There is no intracranial mass, hemorrhage, extra-axial fluid collection, midline shift. There is small vessel disease throughout portions of the centra semiovale bilaterally, stable. There is small vessel disease in each internal capsule, slightly more severe on the right than on the left, stable. No acute appearing infarct is evident. Vascular: There is no appreciable hyperdense vessel. There is no appreciable vascular calcification. Skull: The bony calvarium appears intact. Sinuses/Orbits: There is mucosal thickening in each superior maxillary antrum and several ethmoid air cells.  Visualized orbits appear symmetric bilaterally. Other: Mastoid air cells are clear. IMPRESSION: Stable atrophy with patchy supratentorial small vessel disease which appear stable. No acute infarct evident. No mass or hemorrhage. There is a mild degree of paranasal sinus disease. Electronically Signed   By: Bretta Bang III M.D.   On: 01/20/2019 11:23   Dg Chest Port 1 View  Result Date: 01/20/2019 CLINICAL DATA:  58 year old male with fever for 2-3 days EXAM: PORTABLE CHEST 1 VIEW COMPARISON:  09/07/2018 FINDINGS: Cardiomediastinal silhouette unchanged in size and contour. No pneumothorax. No pleural effusion. Coarsened interstitial markings similar to the comparison. No confluent airspace disease. No interlobular septal thickening. No displaced fracture. IMPRESSION: Chronic lung changes without evidence of superimposed acute cardiopulmonary disease. Electronically Signed   By: Gilmer Mor D.O.   On: 01/20/2019 10:01    Pending Labs Unresulted Labs (From admission, onward)    Start     Ordered   01/27/19 0500  Creatinine, serum  (enoxaparin (LOVENOX)    CrCl >/= 30 ml/min)  Weekly,   STAT    Comments:  while on enoxaparin therapy    01/20/19 1235   01/21/19 0500  Basic metabolic panel  Tomorrow morning,   STAT     01/20/19 1235   01/21/19 0500  CBC  Tomorrow morning,   STAT     01/20/19 1235   01/20/19 1322  CSF cell  count with differential collection tube #: 1  (CSF Labs)  ONCE - STAT,   STAT    Question:  collection tube #  Answer:  1   01/20/19 1321   01/20/19 1322  CSF cell count with differential collection tube #: 4  (CSF Labs)  ONCE - STAT,   STAT    Question:  collection tube #  Answer:  4   01/20/19 1321   01/20/19 1322  CSF culture  (CSF Labs)  ONCE - STAT,   STAT    Question:  Are there also cytology or pathology orders on this specimen?  Answer:  No   01/20/19 1321   01/20/19 1322  Gram stain  (CSF Labs)  ONCE - STAT,   STAT     01/20/19 1321   01/20/19 1322  Protein  and glucose, CSF  (CSF Labs)  ONCE - STAT,   STAT     01/20/19 1321   01/20/19 1236  MRSA PCR Screening  Once,   STAT     01/20/19 1235   01/20/19 1235  HIV antibody (Routine Testing)  Once,   STAT     01/20/19 1235   01/20/19 0857  Blood Culture (routine x 2)  BLOOD CULTURE X 2,   STAT     01/20/19 0857   01/20/19 0857  Urine culture  ONCE - STAT,   STAT     01/20/19 0857          Vitals/Pain Today's Vitals   01/20/19 1100 01/20/19 1130 01/20/19 1145 01/20/19 1200  BP: 113/80 114/83 116/82 117/86  Pulse: 99 100 99 98  Resp: 13 (!) 24 (!) 22 18  Temp:      TempSrc:      SpO2: 98% 97% 97% 98%  Weight:      Height:      PainSc:        Isolation Precautions No active isolations  Medications Medications  enoxaparin (LOVENOX) injection 40 mg (has no administration in time range)  0.9 %  sodium chloride infusion ( Intravenous New Bag/Given 01/20/19 1247)  acetaminophen (TYLENOL) tablet 650 mg (has no administration in time range)    Or  acetaminophen (TYLENOL) suppository 650 mg (has no administration in time range)  HYDROcodone-acetaminophen (NORCO/VICODIN) 5-325 MG per tablet 1-2 tablet (has no administration in time range)  polyethylene glycol (MIRALAX / GLYCOLAX) packet 17 g (has no administration in time range)  ondansetron (ZOFRAN) tablet 4 mg (has no administration in time range)    Or  ondansetron (ZOFRAN) injection 4 mg (has no administration in time range)  vancomycin (VANCOCIN) IVPB 1000 mg/200 mL premix (1,000 mg Intravenous New Bag/Given 01/20/19 1333)  vancomycin (VANCOCIN) 1,750 mg in sodium chloride 0.9 % 500 mL IVPB (has no administration in time range)  ceFEPIme (MAXIPIME) 2 g in sodium chloride 0.9 % 100 mL IVPB (has no administration in time range)  sodium chloride 0.9 % bolus 1,000 mL (0 mLs Intravenous Stopped 01/20/19 1056)  acetaminophen (TYLENOL) tablet 1,000 mg (1,000 mg Oral Given 01/20/19 0903)  sodium chloride 0.9 % bolus 1,000 mL (0 mLs Intravenous  Stopped 01/20/19 1211)  ceFEPIme (MAXIPIME) 1 g in sodium chloride 0.9 % 100 mL IVPB (0 g Intravenous Stopped 01/20/19 1130)  vancomycin (VANCOCIN) IVPB 1000 mg/200 mL premix (0 mg Intravenous Stopped 01/20/19 1244)  lidocaine (PF) (XYLOCAINE) 1 % injection (  Given 01/20/19 1323)    Mobility walks with device High fall risk   Focused Assessments Neuro Assessment  Handoff:  Swallow screen pass? Yes  Cardiac Rhythm: Sinus tachycardia       Neuro Assessment: Within Defined Limits Neuro Checks:      Last Documented NIHSS Modified Score:   Has TPA been given? No If patient is a Neuro Trauma and patient is going to OR before floor call report to 4N Charge nurse: [phone] or [phone]     R Recommendations: See Admitting Provider Note  Report given to:   Additional Notes:

## 2019-01-20 NOTE — Progress Notes (Signed)
Dr Juliene Pina notified via text that patient had critical spinal fluid WBC 17.0

## 2019-01-20 NOTE — ED Notes (Signed)
Gray, chem green, purple, 1 set cultures sent to lab save labels

## 2019-01-20 NOTE — H&P (Signed)
[employer] [facility] [employer] [facility]   PATIENT NAME: Maxwell Miller    MR#:  [id number]  DATE OF BIRTH:  30-Mar-1961  DATE OF ADMISSION:  01/20/2019  PRIMARY CARE PHYSICIAN: Mable Paris, PA-C   REQUESTING/REFERRING PHYSICIAN: dr Don Perking  CHIEF COMPLAINT:   Changes in mental status HISTORY OF PRESENT ILLNESS:  Maxwell Miller  is a 58 y.o. 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 Patient complains of generalized weakness for the past 24 hours with decreased oral intake. Patient was also noted to have a fever and more confused than baseline.  He was brought in by his wife.  Patient denies cough, chills, body aches, urinary symptoms, nausea, vomiting, diarrhea or abdominal pain. His confusion has subsided and he is back to baseline. He does suffer from some short-term memory loss.  In the emergency room he was treated for sepsis with increased white blood cell count, tachycardia along with low-grade fever.  He was treated with cefepime and vancomycin. Patient's wife is medical power of attorney and patient's wife wanted the patient to leave the emergency room however ED physician talked to the patient who is oriented x3 and able to make his decisions who wanted to stay for further evaluation and treatment. PAST MEDICAL HISTORY:   Past Medical History:  Diagnosis Date  . Hypertension   . Stroke [facility])   Intracranial hemorrhage x2  erythrocytosis   PAST SURGICAL HISTORY:  None  SOCIAL HISTORY:   Social History   Tobacco Use  . Smoking status: Never Smoker  Substance Use Topics  . Alcohol use: Never    Frequency: Never    FAMILY HISTORY:  ABDs  DRUG ALLERGIES:  No Known Allergies  REVIEW OF SYSTEMS:   Review of Systems  Constitutional: Positive for fever and malaise/fatigue. Negative  for chills.  HENT: Negative.  Negative for ear discharge, ear pain, hearing loss, nosebleeds and sore throat.   Eyes: Negative.  Negative for blurred vision and pain.  Respiratory: Negative.  Negative for cough, hemoptysis, shortness of breath and wheezing.   Cardiovascular: Negative.  Negative for chest pain, palpitations and leg swelling.  Gastrointestinal: Negative.  Negative for abdominal pain, blood in stool, diarrhea, nausea and vomiting.  Genitourinary: Negative.  Negative for dysuria.  Musculoskeletal: Negative.  Negative for back pain.  Skin: Negative.   Neurological: Negative for dizziness, tremors, speech change, focal weakness, seizures and headaches.  Endo/Heme/Allergies: Negative.  Does not bruise/bleed easily.  Psychiatric/Behavioral: Negative.  Negative for depression, hallucinations and suicidal ideas.    MEDICATIONS AT HOME:   Waiting for medication list from wife  VITAL SIGNS:  Blood pressure 117/86, pulse 98, temperature 99.1 F (37.3 C), temperature source Oral, resp. rate 18, height 5\' 11"  (1.803 m), weight 81.6 kg, SpO2 98 %.  PHYSICAL EXAMINATION:   Physical Exam Constitutional:      General: He is not in acute distress. HENT:     Head: Normocephalic.  Eyes:     General: No scleral icterus. Neck:     Musculoskeletal: Normal range of motion and neck supple.     Vascular: No JVD.     Trachea: No tracheal deviation.  Cardiovascular:     Rate and Rhythm: Normal rate and regular rhythm.     Heart sounds: Normal heart sounds. No murmur. No friction rub. No gallop.   Pulmonary:  Effort: Pulmonary effort is normal. No respiratory distress.     Breath sounds: Normal breath sounds. No wheezing or rales.  Chest:     Chest wall: No tenderness.  Abdominal:     General: Bowel sounds are normal. There is no distension.     Palpations: Abdomen is soft. There is no mass.     Tenderness: There is no abdominal tenderness. There is no guarding or rebound.   Musculoskeletal: Normal range of motion.  Skin:    General: Skin is warm.     Findings: No erythema or rash.  Neurological:     Mental Status: He is alert and oriented to person, place, and time.  Psychiatric:        Judgment: Judgment normal.       LABORATORY PANEL:   CBC Recent Labs  Lab 01/20/19 0843  WBC 29.4*  HGB 16.0  HCT 47.0  PLT 136*   ------------------------------------------------------------------------------------------------------------------  Chemistries  Recent Labs  Lab 01/20/19 0843  NA 134*  K 3.9  CL 101  CO2 22  GLUCOSE 172*  BUN 22*  CREATININE 1.31*  CALCIUM 8.9  AST 21  ALT 18  ALKPHOS 80  BILITOT 3.0*   ------------------------------------------------------------------------------------------------------------------  Cardiac Enzymes No results for input(s): TROPONINI in the last 168 hours. ------------------------------------------------------------------------------------------------------------------  RADIOLOGY:  Ct Head Wo Contrast  Result Date: 01/20/2019 CLINICAL DATA:  Altered mental status/confusion EXAM: CT HEAD WITHOUT CONTRAST TECHNIQUE: Contiguous axial images were obtained from the base of the skull through the vertex without intravenous contrast. COMPARISON:  September 07, 2018 FINDINGS: Brain: There is mild diffuse atrophy, stable. There is no intracranial mass, hemorrhage, extra-axial fluid collection, midline shift. There is small vessel disease throughout portions of the centra semiovale bilaterally, stable. There is small vessel disease in each internal capsule, slightly more severe on the right than on the left, stable. No acute appearing infarct is evident. Vascular: There is no appreciable hyperdense vessel. There is no appreciable vascular calcification. Skull: The bony calvarium appears intact. Sinuses/Orbits: There is mucosal thickening in each superior maxillary antrum and several ethmoid air cells. Visualized  orbits appear symmetric bilaterally. Other: Mastoid air cells are clear. IMPRESSION: Stable atrophy with patchy supratentorial small vessel disease which appear stable. No acute infarct evident. No mass or hemorrhage. There is a mild degree of paranasal sinus disease. Electronically Signed   By: Bretta Bang III M.D.   On: 01/20/2019 11:23   Dg Chest Port 1 View  Result Date: 01/20/2019 CLINICAL DATA:  58 year old male with fever for 2-3 days EXAM: PORTABLE CHEST 1 VIEW COMPARISON:  09/07/2018 FINDINGS: Cardiomediastinal silhouette unchanged in size and contour. No pneumothorax. No pleural effusion. Coarsened interstitial markings similar to the comparison. No confluent airspace disease. No interlobular septal thickening. No displaced fracture. IMPRESSION: Chronic lung changes without evidence of superimposed acute cardiopulmonary disease. Electronically Signed   By: Gilmer Mor D.O.   On: 01/20/2019 10:01    EKG:   Orders placed or performed during the hospital encounter of 01/20/19  . EKG 12-Lead  . EKG 12-Lead  . ED EKG 12-Lead  . ED EKG 12-Lead    IMPRESSION AND PLAN:   T56-year-old male with history of intracranial hemorrhage x2, short-term memory loss and erythrocytosis who presents with generalized weakness, confusion and fever.  1.  Sepsis: Patient presents with tachycardia, elevated white blood cell count and low-grade fever of unclear etiology. Chest x-ray is negative for acute pathology. UA is essentially unremarkable Influenza testing has been negative Continue  broad-spectrum antibiotics including cefepime and vancomycin until final blood cultures have been released. ER physician to perform spinal tap. Continue IV fluids Follow CBC  2.  History of hypertension: We need to obtain medication list Hydralazine PRN  3.  History of intracranial hemorrhage/CVA      All the records are reviewed and case discussed with ED provider. Management plans discussed with the  patient and he is  in agreement  CODE STATUS: FULL  TOTAL TIME TAKING CARE OF THIS PATIENT: 44 minutes.    Palmyra Rogacki M.D on 01/20/2019 at 1:00 PM  Between 7am to 6pm - Pager - [phone]  After 6pm go to [email or web address] - Social research officer, government  [facility]  Office  [phone]  CC: Primary care physician; Mable Paris, PA-C

## 2019-01-21 ENCOUNTER — Inpatient Hospital Stay: Payer: Medicaid Other

## 2019-01-21 DIAGNOSIS — B9620 Unspecified Escherichia coli [E. coli] as the cause of diseases classified elsewhere: Secondary | ICD-10-CM

## 2019-01-21 DIAGNOSIS — Z87820 Personal history of traumatic brain injury: Secondary | ICD-10-CM

## 2019-01-21 DIAGNOSIS — R509 Fever, unspecified: Secondary | ICD-10-CM

## 2019-01-21 DIAGNOSIS — R32 Unspecified urinary incontinence: Secondary | ICD-10-CM

## 2019-01-21 DIAGNOSIS — R7881 Bacteremia: Secondary | ICD-10-CM

## 2019-01-21 DIAGNOSIS — Z8619 Personal history of other infectious and parasitic diseases: Secondary | ICD-10-CM

## 2019-01-21 DIAGNOSIS — I10 Essential (primary) hypertension: Secondary | ICD-10-CM

## 2019-01-21 DIAGNOSIS — Z86718 Personal history of other venous thrombosis and embolism: Secondary | ICD-10-CM

## 2019-01-21 DIAGNOSIS — I69311 Memory deficit following cerebral infarction: Secondary | ICD-10-CM

## 2019-01-21 LAB — BASIC METABOLIC PANEL
Anion gap: 9 (ref 5–15)
BUN: 15 mg/dL (ref 6–20)
CO2: 25 mmol/L (ref 22–32)
Calcium: 8.4 mg/dL — ABNORMAL LOW (ref 8.9–10.3)
Chloride: 104 mmol/L (ref 98–111)
Creatinine, Ser: 1.13 mg/dL (ref 0.61–1.24)
GFR calc Af Amer: >60 mL/min (ref >60)
GFR calc non Af Amer: >60 mL/min (ref >60)
Glucose, Bld: 109 mg/dL — ABNORMAL HIGH (ref 70–99)
Potassium: 3.4 mmol/L — ABNORMAL LOW (ref 3.5–5.1)
Sodium: 138 mmol/L (ref 135–145)

## 2019-01-21 LAB — BLOOD CULTURE ID PANEL (REFLEXED)
Acinetobacter baumannii: NOT DETECTED
Candida albicans: NOT DETECTED
Candida glabrata: NOT DETECTED
Candida krusei: NOT DETECTED
Candida parapsilosis: NOT DETECTED
Candida tropicalis: NOT DETECTED
Carbapenem resistance: NOT DETECTED
ESCHERICHIA COLI: DETECTED — AB
Enterobacter cloacae complex: NOT DETECTED
Enterobacteriaceae species: DETECTED — AB
Enterococcus species: NOT DETECTED
Haemophilus influenzae: NOT DETECTED
Klebsiella oxytoca: NOT DETECTED
Klebsiella pneumoniae: NOT DETECTED
Listeria monocytogenes: NOT DETECTED
Neisseria meningitidis: NOT DETECTED
Proteus species: NOT DETECTED
Pseudomonas aeruginosa: NOT DETECTED
STREPTOCOCCUS PNEUMONIAE: NOT DETECTED
Serratia marcescens: NOT DETECTED
Staphylococcus aureus (BCID): NOT DETECTED
Staphylococcus species: NOT DETECTED
Streptococcus agalactiae: NOT DETECTED
Streptococcus pyogenes: NOT DETECTED
Streptococcus species: NOT DETECTED

## 2019-01-21 LAB — URINE CULTURE: Culture: NO GROWTH

## 2019-01-21 LAB — CBC
HCT: 46.3 % (ref 39.0–52.0)
Hemoglobin: 15.4 g/dL (ref 13.0–17.0)
MCH: 29.3 pg (ref 26.0–34.0)
MCHC: 33.3 g/dL (ref 30.0–36.0)
MCV: 88 fL (ref 80.0–100.0)
Platelets: 131 10*3/uL — ABNORMAL LOW (ref 150–400)
RBC: 5.26 MIL/uL (ref 4.22–5.81)
RDW: 13.7 % (ref 11.5–15.5)
WBC: 12.4 10*3/uL — ABNORMAL HIGH (ref 4.0–10.5)
nRBC: 0 % (ref 0.0–0.2)

## 2019-01-21 LAB — HIV ANTIBODY (ROUTINE TESTING W REFLEX): HIV Screen 4th Generation wRfx: NONREACTIVE

## 2019-01-21 MED ORDER — ATORVASTATIN CALCIUM 20 MG PO TABS
20.0000 mg | ORAL_TABLET | Freq: Every day | ORAL | Status: DC
  Administered 2019-01-21 – 2019-03-31 (×68): 20 mg via ORAL
  Filled 2019-01-21 (×68): qty 1

## 2019-01-21 MED ORDER — IOHEXOL 350 MG/ML SOLN
100.0000 mL | Freq: Once | INTRAVENOUS | Status: AC | PRN
  Administered 2019-01-21: 100 mL via INTRAVENOUS

## 2019-01-21 MED ORDER — SODIUM CHLORIDE 0.9 % IV SOLN
1.0000 g | Freq: Three times a day (TID) | INTRAVENOUS | Status: DC
  Administered 2019-01-21 – 2019-01-23 (×6): 1 g via INTRAVENOUS
  Filled 2019-01-21 (×8): qty 1

## 2019-01-21 MED ORDER — VENLAFAXINE HCL ER 75 MG PO CP24
150.0000 mg | ORAL_CAPSULE | Freq: Every day | ORAL | Status: DC
  Administered 2019-01-22 – 2019-04-01 (×68): 150 mg via ORAL
  Filled 2019-01-21 (×70): qty 2

## 2019-01-21 NOTE — Progress Notes (Signed)
PHARMACY - PHYSICIAN COMMUNICATION CRITICAL VALUE ALERT - BLOOD CULTURE IDENTIFICATION (BCID)  Maxwell Miller is an 58 y.o. male who presented to [facility] on 01/20/2019 with a chief complaint of AMS and sepsis   Assessment:  BCID showing [name] in 1 of 4 bottles  Name of physician (or Provider) Contacted: Dr. Auburn Bilberry  Current antibiotics: Cefepime and Vancomycin   Changes to prescribed antibiotics recommended:  DC cefepime and vancomycin. Start meropenem.   Results for orders placed or performed during the hospital encounter of 01/20/19  Blood Culture ID Panel (Reflexed) (Collected: 01/20/2019  9:14 AM)  Result Value Ref Range   Enterococcus species NOT DETECTED NOT DETECTED   Listeria monocytogenes NOT DETECTED NOT DETECTED   Staphylococcus species NOT DETECTED NOT DETECTED   Staphylococcus aureus (BCID) NOT DETECTED NOT DETECTED   Streptococcus species NOT DETECTED NOT DETECTED   Streptococcus agalactiae NOT DETECTED NOT DETECTED   Streptococcus pneumoniae NOT DETECTED NOT DETECTED   Streptococcus pyogenes NOT DETECTED NOT DETECTED   Acinetobacter baumannii NOT DETECTED NOT DETECTED   Enterobacteriaceae species DETECTED (A) NOT DETECTED   Enterobacter cloacae complex NOT DETECTED NOT DETECTED   Escherichia coli DETECTED (A) NOT DETECTED   Klebsiella oxytoca NOT DETECTED NOT DETECTED   Klebsiella pneumoniae NOT DETECTED NOT DETECTED   Proteus species NOT DETECTED NOT DETECTED   Serratia marcescens NOT DETECTED NOT DETECTED   Carbapenem resistance NOT DETECTED NOT DETECTED   Haemophilus influenzae NOT DETECTED NOT DETECTED   Neisseria meningitidis NOT DETECTED NOT DETECTED   Pseudomonas aeruginosa NOT DETECTED NOT DETECTED   Candida albicans NOT DETECTED NOT DETECTED   Candida glabrata NOT DETECTED NOT DETECTED   Candida krusei NOT DETECTED NOT DETECTED   Candida parapsilosis NOT DETECTED NOT DETECTED   Candida tropicalis NOT DETECTED NOT DETECTED    Gardner Candle, PharmD, BCPS Clinical Pharmacist 01/21/2019 8:08 AM

## 2019-01-21 NOTE — Progress Notes (Signed)
Pharmacy Antibiotic Note  Maxwell Miller is a 58 y.o. male admitted on 01/20/2019 with sepsis.  BCID now showing [name] in 1 of bottles.  Pharmacy was initially consulted for cefepime and vancomycin dosing. Cefepime and Vancomycin have been DCd, pharmacy now consulted for meropenem dosing until sensitives are available.    Plan: 3/11 Start Meropenem 1g IV every 8 hours   Height: 5\' 11"  (180.3 cm) Weight: 180 lb (81.6 kg) IBW/kg (Calculated) : 75.3  Temp (24hrs), Avg:101.1 F (38.4 C), Min:98.6 F (37 C), Max:103.4 F (39.7 C)  Recent Labs  Lab 01/20/19 0843 01/20/19 1045 01/20/19 1245 01/21/19 0311  WBC 29.4*  --  27.6* 12.4*  CREATININE 1.31*  --  1.20 1.13  LATICACIDVEN 1.8 1.5  --   --     Estimated Creatinine Clearance: 76.8 mL/min (by C-G formula based on SCr of 1.13 mg/dL).    No Known Allergies  Antimicrobials this admission: 3/10 cefepime >> 3/11 3/10 vancomycin >> 3/11 3/11 meropenem   Dose adjustments this admission: None  Microbiology results: 3/10 BCx: GNR 3/10 BCID: [name]  3/10 UCx: pending 3/10 MRSA PCR (-)   Thank you for allowing pharmacy to be a part of this patient's care.  Gardner Candle, PharmD, BCPS Clinical Pharmacist  01/21/2019 8:09 AM

## 2019-01-21 NOTE — Consult Note (Signed)
NAME: Maxwell Miller  DOB: 08-21-1961  MRN: [id number]  Date/Time: 01/21/2019 12:38 PM  REQUESTING PROVIDER:Patel Subjective:  REASON FOR CONSULT: fever ?Historynot available from patient as he has poor memory. Called his wife but could not reach her. Chart reviewed, also reviewed [facility] records Pt able to answer many questions -  past recollection good  Maxwell Miller is a 58 y.o. male with a history of CVA, thalamic bleed with IVH,  HTN , portal vein thrombosis , erythrocytosis was brought to ED by his ex wife who reported that he was having fever, urinary incontinence and generalized weakness. Pt apparently at baseline has some confusion and poor memory following a stroke he had in 2011 and he was confused more than his baseline  In the ED he had a CT scan which did not show any acute changes, He had a temp of 100.7 and underwent LP and started on vanco and cefepime- Blood culture BCID has come back as E.coli and the antibiotics have been changed to Meropenem this afternoon- I am asked to see the patient for fever. Pt does not register fever, He says he has been feeling weak, appetite poor for the past 2 days- says he can walk and manages personal hygiene . Says he is originally from [place] and lived in [place] and used to be a Investment banker, operational. Did not want to eat the  food as he said it is not specially prepared for him  PMH  As per [facility] record he presented to their ED in Nov 2015 with acute rt sided weakness , confusion and urinary incontinence and was found to have left thalamic ICU, with extensive IVH which  was thought to be hypertensive hemorrhage BP then was 229/137. He had spiked a fever and Blood cultures grew MSSA and urine CX grew E,coli and klebsiella. He was treated for both with oxacillin and levaquin.  The note also mentioned a prior thalamic hemorrhage in 2011/2.He was followed at [facility] neurology as OP and their note mentions severe memory difficulties and behavioral changes following the stroke. In  Sept 2017 he presented to [facility] with severe Rt UQ pain and was imaging showed acute portal vein thrombosis. A liver doppler revealed occlusive thrombus in the main portal vein, right portal vein, midline splenic vein and superior mesenteric vein, but no focal hepatic lesion. A follow-up CT abdomen/pelvis with contrast was significant for portal venous thrombus with surrounding fat-stranding, inflammation surrounding the pancreatic head, most likely to be reactive in nature, and a small, indeterminate prostate nodule. Tumor markers were negative (PSA, CEA, CA 19-9 and AFP), Thrombophilia work up was negative.  ( He did have a mildly low antithrombin III activity but this was checked in the setting of heparin, which can falsely lower antithrombin III. Notably PNH screen, myeloid mutation panel, and SPEP were all negative. He had a mildly elevated lupus anticoagulant by APTT with a HPN difference of 11.2, however, this is in the setting of an acute thrombus and cannot be fully  interpreted. , DRVVT, anticardiolipin Ab, and beta 2 glycoprotein Ab were all negative. ) He was sent on pradaxa but came back a couple of weeks later with progression of the thrombosis. He was then put on long term eliquis. He was asked to follow up in 2019 but he did not go to [facility] hematology since Jan 2018    Past Medical History:  Diagnosis Date  . Hypertension   . Stroke [facility])    Portal vein thrombosis History reviewed. No pertinent  surgical history.  Social History   Socioeconomic History  . Marital status: Divorced    Spouse name: Not on file  . Number of children: Not on file  . Years of education: Not on file  . Highest education level: Not on file  Occupational History  . Not on file  Social Needs  . Financial resource strain: Not on file  . Food insecurity:    Worry: Not on file    Inability: Not on file  . Transportation needs:    Medical: Not on file    Non-medical: Not on file  Tobacco Use  . Smoking  status: Never Smoker  . Smokeless tobacco: Never Used  Substance and Sexual Activity  . Alcohol use: Never    Frequency: Never  . Drug use: Never  . Sexual activity: Not on file  Lifestyle  . Physical activity:    Days per week: Not on file    Minutes per session: Not on file  . Stress: Not on file  Relationships  . Social connections:    Talks on phone: Not on file    Gets together: Not on file    Attends religious service: Not on file    Active member of club or organization: Not on file    Attends meetings of clubs or organizations: Not on file    Relationship status: Not on file  . Intimate partner violence:    Fear of current or ex partner: Not on file    Emotionally abused: Not on file    Physically abused: Not on file    Forced sexual activity: Not on file  Other Topics Concern  . Not on file  Social History Narrative  . Not on file    History reviewed. No pertinent family history. No Known Allergies ? Current Facility-Administered Medications  Medication Dose Route Frequency Provider Last Rate Last Dose  . 0.9 %  sodium chloride infusion   Intravenous Continuous Adrian Saran, MD 100 mL/hr at 01/21/19 0945    . acetaminophen (TYLENOL) tablet 650 mg  650 mg Oral Q6H PRN Adrian Saran, MD   650 mg at 01/21/19 0425   Or  . acetaminophen (TYLENOL) suppository 650 mg  650 mg Rectal Q6H PRN Mody, Sital, MD      . enoxaparin (LOVENOX) injection 40 mg  40 mg Subcutaneous Q24H Mody, Sital, MD   40 mg at 01/20/19 1654  . hydrALAZINE (APRESOLINE) injection 10 mg  10 mg Intravenous Q6H PRN Mody, Sital, MD      . HYDROcodone-acetaminophen (NORCO/VICODIN) 5-325 MG per tablet 1-2 tablet  1-2 tablet Oral Q4H PRN Mody, Sital, MD      . meropenem (MERREM) 1 g in sodium chloride 0.9 % 100 mL IVPB  1 g Intravenous Q8H Hallaji, Sheema M, RPH      . ondansetron (ZOFRAN) tablet 4 mg  4 mg Oral Q6H PRN Mody, Sital, MD       Or  . ondansetron (ZOFRAN) injection 4 mg  4 mg Intravenous Q6H PRN  Mody, Sital, MD      . polyethylene glycol (MIRALAX / GLYCOLAX) packet 17 g  17 g Oral Daily PRN Adrian Saran, MD         Abtx:  Anti-infectives (From admission, onward)   Start     Dose/Rate Route Frequency Ordered Stop   01/21/19 1200  meropenem (MERREM) 1 g in sodium chloride 0.9 % 100 mL IVPB     1 g 200 mL/hr over 30  Minutes Intravenous Every 8 hours 01/21/19 0806     01/21/19 1100  vancomycin (VANCOCIN) 1,750 mg in sodium chloride 0.9 % 500 mL IVPB  Status:  Discontinued     1,750 mg 250 mL/hr over 120 Minutes Intravenous Every 24 hours 01/20/19 1252 01/21/19 0806   01/20/19 1600  ceFEPIme (MAXIPIME) 2 g in sodium chloride 0.9 % 100 mL IVPB  Status:  Discontinued     2 g 200 mL/hr over 30 Minutes Intravenous Every 8 hours 01/20/19 1252 01/21/19 0806   01/20/19 1300  vancomycin (VANCOCIN) IVPB 1000 mg/200 mL premix     1,000 mg 200 mL/hr over 60 Minutes Intravenous  Once 01/20/19 1252 01/20/19 1644   01/20/19 1030  ceFEPIme (MAXIPIME) 1 g in sodium chloride 0.9 % 100 mL IVPB     1 g 200 mL/hr over 30 Minutes Intravenous  Once 01/20/19 1029 01/20/19 1130   01/20/19 1030  vancomycin (VANCOCIN) IVPB 1000 mg/200 mL premix     1,000 mg 200 mL/hr over 60 Minutes Intravenous  Once 01/20/19 1029 01/20/19 1244      REVIEW OF SYSTEMS:  Pt says he does not know why he is here He denied headache ,fever, cough, sob, abdominal pain He denied any sore throat He denied visual problem Says he does not walk much Eyes: negative diplopia or visual changes, negative eye pain ENT: negative coryza, negative sore throat Resp: negative cough, hemoptysis, dyspnea Cards: negative for chest pain, palpitations, lower extremity edema GU: negative for frequency, dysuria and hematuria GI: Negative for abdominal pain, diarrhea, bleeding, constipation Skin: negative for rash and pruritus Heme: negative for easy bruising and gum/nose bleeding MS: negative for myalgias, arthralgias, back pain and muscle  weakness Neurology-- acknowledges memory problems and says he cannot remember a lot of things, talks in a [employer]isper 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 Objective:  VITALS:  BP 130/84 (BP Location: Right Arm)   Pulse (!) 121   Temp 100.2 F (37.9 C) (Oral)   Resp 20   Ht 5\' 11"  (1.803 m)   Wt 81.6 kg   SpO2 96%   BMI 25.10 kg/m  PHYSICAL EXAM:  General: Awake,Alert, oriented in place and person, does not remember the year, month -talks in a whisper  Follows commands appropriately   cooperative, no distress, appears stated age.  Involuntary movts face, tongue tremors/jerky movt Head: Normocephalic, without obvious abnormality, atraumatic., no neck rigidity Eyes: Conjunctivae clear, anicteric sclerae. Pupils are equal, no nystagmus. FROM ENT Nares normal. No drainage or sinus tenderness. Lips, mucosa, and tongue normal. No Thrush, tremors tongue with jerky movts Neck: Supple, symmetrical, no adenopathy, thyroid: non tender no carotid bruit and no JVD. Back: No CVA tenderness. Lungs: b/l air entry Heart:s1s2 Tachycardia Abdomen: Soft, non-tender,not distended. Bowel sounds normal. No masses Extremities: atraumatic, no cyanosis. No edema. No clubbing Skin: No rashes or lesions. Or bruising Lymph: Cervical, supraclavicular normal. Neurologic: moves all limbs, no  Pertinent Labs Lab Results CBC    Component Value Date/Time   WBC 12.4 (H) 01/21/2019 0311   RBC 5.26 01/21/2019 0311   HGB 15.4 01/21/2019 0311   HCT 46.3 01/21/2019 0311   PLT 131 (L) 01/21/2019 0311   MCV 88.0 01/21/2019 0311   MCH 29.3 01/21/2019 0311   MCHC 33.3 01/21/2019 0311   RDW  13.7 01/21/2019 0311   LYMPHSABS 1.1 01/20/2019 0843   MONOABS 2.4 (H) 01/20/2019 0843   EOSABS 0.0 01/20/2019 0843   BASOSABS  0.1 01/20/2019 0843    CMP Latest Ref Rng & Units 01/21/2019 01/20/2019 01/20/2019  Glucose 70 - 99 mg/dL 409(W) - 119(J)  BUN 6 - 20 mg/dL 15 - 47(W)  Creatinine 0.61 - 1.24 mg/dL 2.95 6.21 3.08(M)  Sodium 135 - 145 mmol/L 138 - 134(L)  Potassium 3.5 - 5.1 mmol/L 3.4(L) - 3.9  Chloride 98 - 111 mmol/L 104 - 101  CO2 22 - 32 mmol/L 25 - 22  Calcium 8.9 - 10.3 mg/dL 5.7(Q) - 8.9  Total Protein 6.5 - 8.1 g/dL - - 8.0  Total Bilirubin 0.3 - 1.2 mg/dL - - 3.0(H)  Alkaline Phos 38 - 126 U/L - - 80  AST 15 - 41 U/L - - 21  ALT 0 - 44 U/L - - 18      Microbiology: Recent Results (from the past 240 hour(s))  Blood Culture (routine x 2)     Status: None (Preliminary result)   Collection Time: 01/20/19  8:43 AM  Result Value Ref Range Status   Specimen Description BLOOD RIGHT [facility]  Final   Special Requests   Final    BOTTLES DRAWN AEROBIC AND ANAEROBIC Blood Culture adequate volume   Culture   Final    NO GROWTH < 24 HOURS Performed at [facility], [address], [place], [place] [address]    Report Status PENDING  Incomplete  Blood Culture (routine x 2)     Status: None (Preliminary result)   Collection Time: 01/20/19  9:14 AM  Result Value Ref Range Status   Specimen Description BLOOD BLOOD LEFT FOREARM  Final   Special Requests   Final    BOTTLES DRAWN AEROBIC AND ANAEROBIC Blood Culture results may not be optimal due to an excessive volume of blood received in culture bottles   Culture  Setup Time   Final    Organism ID to follow ANAEROBIC BOTTLE ONLY GRAM NEGATIVE RODS CRITICAL RESULT CALLED TO, READ BACK BY AND VERIFIED WITH: CHRISTIAN MURRELL AT 0753 ON 01/21/2019 JJB Performed at [facility], [address] [place], [place] [address]    Culture GRAM NEGATIVE RODS  Final   Report Status PENDING  Incomplete  Blood Culture ID Panel (Reflexed)     Status: Abnormal   Collection Time: 01/20/19  9:14 AM  Result Value Ref Range Status   Enterococcus  species NOT DETECTED NOT DETECTED Final   Listeria monocytogenes NOT DETECTED NOT DETECTED Final   Staphylococcus species NOT DETECTED NOT DETECTED Final   Staphylococcus aureus (BCID) NOT DETECTED NOT DETECTED Final   Streptococcus species NOT DETECTED NOT DETECTED Final   Streptococcus agalactiae NOT DETECTED NOT DETECTED Final   Streptococcus pneumoniae NOT DETECTED NOT DETECTED Final   Streptococcus pyogenes NOT DETECTED NOT DETECTED Final   Acinetobacter baumannii NOT DETECTED NOT DETECTED Final   Enterobacteriaceae species DETECTED (A) NOT DETECTED Final    Comment: Enterobacteriaceae represent a large family of gram-negative bacteria, not a single organism. CRITICAL RESULT CALLED TO, READ BACK BY AND VERIFIED WITH: CHRISTIAN MURRELL AT 0753 ON 01/21/2019 JJB    Enterobacter cloacae complex NOT DETECTED NOT DETECTED Final   Escherichia coli DETECTED (A) NOT DETECTED Final    Comment: CRITICAL RESULT CALLED TO, READ BACK BY AND VERIFIED WITH: CHRISTIAN MURRELL AT 0753 ON 01/21/2019 JJB    Klebsiella oxytoca NOT  DETECTED NOT DETECTED Final   Klebsiella pneumoniae NOT DETECTED NOT DETECTED Final   Proteus species NOT DETECTED NOT DETECTED Final   Serratia marcescens NOT DETECTED NOT DETECTED Final   Carbapenem resistance NOT DETECTED NOT DETECTED Final   Haemophilus influenzae NOT DETECTED NOT DETECTED Final   Neisseria meningitidis NOT DETECTED NOT DETECTED Final   Pseudomonas aeruginosa NOT DETECTED NOT DETECTED Final   Candida albicans NOT DETECTED NOT DETECTED Final   Candida glabrata NOT DETECTED NOT DETECTED Final   Candida krusei NOT DETECTED NOT DETECTED Final   Candida parapsilosis NOT DETECTED NOT DETECTED Final   Candida tropicalis NOT DETECTED NOT DETECTED Final    Comment: Performed at [facility], [address], [place], [place] [address]  Urine culture     Status: None   Collection Time: 01/20/19  9:58 AM  Result Value Ref Range Status   Specimen  Description   Final    URINE, RANDOM Performed at [facility], [address], [place], [place] [address]    Special Requests   Final    NONE Performed at [facility], [address] [place], [place] [address]    Culture   Final    NO GROWTH Performed at [facility], [address], [place], [place] [address]    Report Status 01/21/2019 FINAL  Final  MRSA PCR Screening     Status: None   Collection Time: 01/20/19 12:45 PM  Result Value Ref Range Status   MRSA by PCR NEGATIVE NEGATIVE Final    Comment:        The GeneXpert MRSA Assay (FDA approved for NASAL specimens only), is one component of a comprehensive MRSA colonization surveillance program. It is not intended to diagnose MRSA infection nor to guide or monitor treatment for MRSA infections. Performed at [facility], [address] [place], [place] [address]   CSF culture     Status: None (Preliminary result)   Collection Time: 01/20/19  1:32 PM  Result Value Ref Range Status   Specimen Description   Final    CSF Performed at [facility], [address], [place], [place] [address]    Special Requests   Final    NONE Performed at [facility], [address] [place], [place] [address]    Gram Stain   Final    NO ORGANISMS SEEN WBC SEEN RED BLOOD CELLS Performed at [facility], [address] [place], [place] [address]    Culture   Final    NO GROWTH < 12 HOURS Performed at [facility], [address], [place], [place] [address]    Report Status PENDING  Incomplete      IMAGING RESULTS: CT head There is mild diffuse atrophy, stable. There is no intracranial mass, hemorrhage, extra-axial fluid collection, midline shift. There is small vessel disease throughout portions of the centra semiovale bilaterally, stable. There is small vessel disease in each internal capsule, slightly more severe on the right than on the left,  stable. No acute appearing infarct is evident.  I have personally reviewed the films ? Impression/Recommendation 58 y.o. male with a history of CVA, thalamic bleed with IVH,  HTN , portal vein thrombosis was brought to ED by his ex wife who reported that he was having fever, urinary incontinence and generalized weakness. Pt apparently at baseline has some confusion and poor memory following a stroke he had in 2011 and he was confused more  than his baseline ? ?Fever with confusion more than baseline-  E.coli bacteremia 1 of 2 bottle- UA looks clean but he has incontinence and still could be the source for the bacteremia. Also he has a h/o severe portal vein thrombosis diagnosed in 2017 and not sure whether he is currently on anticoagulation- He has no abdominal tenderness- but if his fever persist he will need CTA abdomen. VAnco and cefepime changed to meropenem- continue the same  LP was done and it looks traumatic as the first tube had 3418 RBC and 17 wbc and tube 4 had 0 wbc and 212 RBC His clinical picture does not fit with bacterial meningitis or herpetic encephalitis - CNS examination reveals short term memory loss , some tremors, involuntary movts-? MRI  recommend Neruoconsult  H/o CVA with b/l thalamic bleeds in 2011.2015 due to hypertension  H/o extensive portal vein thrombosis with no cause in 2017 -( normal tumor markers and thrombophilia screen except for mildly elevated lupus anticoagulantwas [facility] and was on [facility]- last saw heme in Jan 2018. As fever persists will need to do CTA abdomen   HTN: management as per primary team ? ___________________________________________________ Discussed with patient and  requesting provider and his nurse

## 2019-01-21 NOTE — Progress Notes (Signed)
[facility] - [place] at [facility]                                                                                                                                                                                  Patient Demographics   Maxwell Miller, is a 58 y.o. male, DOB - 1961/10/26, ZPS:[id number]  Admit date - 01/20/2019   Admitting Physician Adrian Saran, MD  Outpatient Primary MD for the patient is Mable Paris, PA-C   LOS - 1  Subjective:  Patient admitted with sepsis   Review of Systems:   CONSTITUTIONAL: Limited history patient not able to provide much  Vitals:   Vitals:   01/20/19 2255 01/21/19 0416 01/21/19 0749 01/21/19 0939  BP: 116/78 130/84    Pulse: (!) 108 (!) 121    Resp: 18 20    Temp: 99 F (37.2 C) (!) 103.1 F (39.5 C) 100.3 F (37.9 C) 100.2 F (37.9 C)  TempSrc: Oral  Oral Oral  SpO2: 96% 96%    Weight:      Height:        Wt Readings from Last 3 Encounters:  01/20/19 81.6 kg  09/07/18 86.2 kg     Intake/Output Summary (Last 24 hours) at 01/21/2019 1234 Last data filed at 01/21/2019 0300 Gross per 24 hour  Intake 2457.99 ml  Output 300 ml  Net 2157.99 ml    Physical Exam:   GENERAL: Chronically ill-appearing HEAD, EYES, EARS, NOSE AND THROAT: Atraumatic, normocephalic. Extraocular muscles are intact. Pupils equal and reactive to light. Sclerae anicteric. No conjunctival injection. No oro-pharyngeal erythema.  NECK: Supple. There is no jugular venous distention. No bruits, no lymphadenopathy, no thyromegaly.  HEART: Regular rate and rhythm,. No murmurs, no rubs, no clicks.  LUNGS: Clear to auscultation bilaterally. No rales or rhonchi. No wheezes.  ABDOMEN: Soft, flat, nontender, nondistended. Has good bowel sounds. No hepatosplenomegaly appreciated.  EXTREMITIES: No evidence of any cyanosis, clubbing, or peripheral edema.  +2 pedal and radial pulses bilaterally.  NEUROLOGIC: Patient is awake not oriented SKIN:  Moist and warm with no rashes appreciated.  Psych: Not anxious, depressed LN: No inguinal LN enlargement    Antibiotics   Anti-infectives (From admission, onward)   Start     Dose/Rate Route Frequency Ordered Stop   01/21/19 1200  meropenem (MERREM) 1 g in sodium chloride 0.9 % 100 mL IVPB     1 g 200 mL/hr over 30 Minutes Intravenous Every 8 hours 01/21/19 0806     01/21/19 1100  vancomycin (VANCOCIN) 1,750 mg in sodium chloride 0.9 % 500 mL IVPB  Status:  Discontinued     1,750 mg 250 mL/hr over  120 Minutes Intravenous Every 24 hours 01/20/19 1252 01/21/19 0806   01/20/19 1600  ceFEPIme (MAXIPIME) 2 g in sodium chloride 0.9 % 100 mL IVPB  Status:  Discontinued     2 g 200 mL/hr over 30 Minutes Intravenous Every 8 hours 01/20/19 1252 01/21/19 0806   01/20/19 1300  vancomycin (VANCOCIN) IVPB 1000 mg/200 mL premix     1,000 mg 200 mL/hr over 60 Minutes Intravenous  Once 01/20/19 1252 01/20/19 1644   01/20/19 1030  ceFEPIme (MAXIPIME) 1 g in sodium chloride 0.9 % 100 mL IVPB     1 g 200 mL/hr over 30 Minutes Intravenous  Once 01/20/19 1029 01/20/19 1130   01/20/19 1030  vancomycin (VANCOCIN) IVPB 1000 mg/200 mL premix     1,000 mg 200 mL/hr over 60 Minutes Intravenous  Once 01/20/19 1029 01/20/19 1244      Medications   Scheduled Meds: . enoxaparin (LOVENOX) injection  40 mg Subcutaneous Q24H   Continuous Infusions: . sodium chloride 100 mL/hr at 01/21/19 0945  . meropenem (MERREM) IV     PRN Meds:.acetaminophen **OR** acetaminophen, hydrALAZINE, HYDROcodone-acetaminophen, ondansetron **OR** ondansetron (ZOFRAN) IV, polyethylene glycol   Data Review:   Micro Results Recent Results (from the past 240 hour(s))  Blood Culture (routine x 2)     Status: None (Preliminary result)   Collection Time: 01/20/19  8:43 AM  Result Value Ref Range Status   Specimen Description BLOOD RIGHT [facility]  Final   Special Requests   Final    BOTTLES DRAWN AEROBIC AND ANAEROBIC Blood Culture  adequate volume   Culture   Final    NO GROWTH < 24 HOURS Performed at [facility], [address] [place], [place] [address]    Report Status PENDING  Incomplete  Blood Culture (routine x 2)     Status: None (Preliminary result)   Collection Time: 01/20/19  9:14 AM  Result Value Ref Range Status   Specimen Description BLOOD BLOOD LEFT FOREARM  Final   Special Requests   Final    BOTTLES DRAWN AEROBIC AND ANAEROBIC Blood Culture results may not be optimal due to an excessive volume of blood received in culture bottles   Culture  Setup Time   Final    Organism ID to follow ANAEROBIC BOTTLE ONLY GRAM NEGATIVE RODS CRITICAL RESULT CALLED TO, READ BACK BY AND VERIFIED WITH: CHRISTIAN MURRELL AT 0753 ON 01/21/2019 JJB Performed at [facility], [address] [place], [place] [address]    Culture GRAM NEGATIVE RODS  Final   Report Status PENDING  Incomplete  Blood Culture ID Panel (Reflexed)     Status: Abnormal   Collection Time: 01/20/19  9:14 AM  Result Value Ref Range Status   Enterococcus species NOT DETECTED NOT DETECTED Final   Listeria monocytogenes NOT DETECTED NOT DETECTED Final   Staphylococcus species NOT DETECTED NOT DETECTED Final   Staphylococcus aureus (BCID) NOT DETECTED NOT DETECTED Final   Streptococcus species NOT DETECTED NOT DETECTED Final   Streptococcus agalactiae NOT DETECTED NOT DETECTED Final   Streptococcus pneumoniae NOT DETECTED NOT DETECTED Final   Streptococcus pyogenes NOT DETECTED NOT DETECTED Final   Acinetobacter baumannii NOT DETECTED NOT DETECTED Final   Enterobacteriaceae species DETECTED (A) NOT DETECTED Final    Comment: Enterobacteriaceae represent a large family of gram-negative bacteria, not a single organism. CRITICAL RESULT CALLED TO, READ BACK BY AND VERIFIED WITH: CHRISTIAN MURRELL AT 0753 ON 01/21/2019 JJB    Enterobacter cloacae complex NOT DETECTED  NOT DETECTED Final   Escherichia coli DETECTED (A) NOT DETECTED  Final    Comment: CRITICAL RESULT CALLED TO, READ BACK BY AND VERIFIED WITH: CHRISTIAN MURRELL AT 0753 ON 01/21/2019 JJB    Klebsiella oxytoca NOT DETECTED NOT DETECTED Final   Klebsiella pneumoniae NOT DETECTED NOT DETECTED Final   Proteus species NOT DETECTED NOT DETECTED Final   Serratia marcescens NOT DETECTED NOT DETECTED Final   Carbapenem resistance NOT DETECTED NOT DETECTED Final   Haemophilus influenzae NOT DETECTED NOT DETECTED Final   Neisseria meningitidis NOT DETECTED NOT DETECTED Final   Pseudomonas aeruginosa NOT DETECTED NOT DETECTED Final   Candida albicans NOT DETECTED NOT DETECTED Final   Candida glabrata NOT DETECTED NOT DETECTED Final   Candida krusei NOT DETECTED NOT DETECTED Final   Candida parapsilosis NOT DETECTED NOT DETECTED Final   Candida tropicalis NOT DETECTED NOT DETECTED Final    Comment: Performed at [facility], [address], [place], [place] [address]  Urine culture     Status: None   Collection Time: 01/20/19  9:58 AM  Result Value Ref Range Status   Specimen Description   Final    URINE, RANDOM Performed at [facility], [address] [place], [place] [address]    Special Requests   Final    NONE Performed at [facility], [address], [place], [place] [address]    Culture   Final    NO GROWTH Performed at [facility], [address] [place], [place] [address]    Report Status 01/21/2019 FINAL  Final  MRSA PCR Screening     Status: None   Collection Time: 01/20/19 12:45 PM  Result Value Ref Range Status   MRSA by PCR NEGATIVE NEGATIVE Final    Comment:        The GeneXpert MRSA Assay (FDA approved for NASAL specimens only), is one component of a comprehensive MRSA colonization surveillance program. It is not intended to diagnose MRSA infection nor to guide or monitor treatment for MRSA infections. Performed at [facility], [address] [place], [place] [address]   CSF  culture     Status: None (Preliminary result)   Collection Time: 01/20/19  1:32 PM  Result Value Ref Range Status   Specimen Description   Final    CSF Performed at [facility], [address], [place], [place] [address]    Special Requests   Final    NONE Performed at [facility], [address] [place], [place] [address]    Gram Stain   Final    NO ORGANISMS SEEN WBC SEEN RED BLOOD CELLS Performed at [facility], [address] [place], [place] [address]    Culture   Final    NO GROWTH < 12 HOURS Performed at [facility], [address] [place], [place] [address]    Report Status PENDING  Incomplete    Radiology Reports Ct Head Wo Contrast  Result Date: 01/20/2019 CLINICAL DATA:  Altered mental status/confusion EXAM: CT HEAD WITHOUT CONTRAST TECHNIQUE: Contiguous axial images were obtained from the base of the skull through the vertex without intravenous contrast. COMPARISON:  September 07, 2018 FINDINGS: Brain: There is mild diffuse atrophy, stable. There is no intracranial mass, hemorrhage, extra-axial fluid collection, midline shift. There is small vessel disease throughout portions of the centra semiovale bilaterally, stable. There is small vessel disease in each internal capsule, slightly more severe on the right than  on the left, stable. No acute appearing infarct is evident. Vascular: There is no appreciable hyperdense vessel. There is no appreciable vascular calcification. Skull: The bony calvarium appears intact. Sinuses/Orbits: There is mucosal thickening in each superior maxillary antrum and several ethmoid air cells. Visualized orbits appear symmetric bilaterally. Other: Mastoid air cells are clear. IMPRESSION: Stable atrophy with patchy supratentorial small vessel disease which appear stable. No acute infarct evident. No mass or hemorrhage. There is a mild degree of paranasal sinus disease. Electronically Signed   By: Bretta Bang III M.D.   On: 01/20/2019 11:23   Dg Chest Port 1 View  Result Date: 01/20/2019 CLINICAL DATA:  58 year old male with fever for 2-3 days EXAM: PORTABLE CHEST 1 VIEW COMPARISON:  09/07/2018 FINDINGS: Cardiomediastinal silhouette unchanged in size and contour. No pneumothorax. No pleural effusion. Coarsened interstitial markings similar to the comparison. No confluent airspace disease. No interlobular septal thickening. No displaced fracture. IMPRESSION: Chronic lung changes without evidence of superimposed acute cardiopulmonary disease. Electronically Signed   By: Gilmer Mor D.O.   On: 01/20/2019 10:01     CBC Recent Labs  Lab 01/20/19 0843 01/20/19 1245 01/21/19 0311  WBC 29.4* 27.6* 12.4*  HGB 16.0 14.4 15.4  HCT 47.0 43.3 46.3  PLT 136* 128* 131*  MCV 86.7 88.7 88.0  MCH 29.5 29.5 29.3  MCHC 34.0 33.3 33.3  RDW 13.7 13.4 13.7  LYMPHSABS 1.1  --   --   MONOABS 2.4*  --   --   EOSABS 0.0  --   --   BASOSABS 0.1  --   --     Chemistries  Recent Labs  Lab 01/20/19 0843 01/20/19 1245 01/21/19 0311  NA 134*  --  138  K 3.9  --  3.4*  CL 101  --  104  CO2 22  --  25  GLUCOSE 172*  --  109*  BUN 22*  --  15  CREATININE 1.31* 1.20 1.13  CALCIUM 8.9  --  8.4*  AST 21  --   --   ALT 18  --   --   ALKPHOS 80  --   --   BILITOT 3.0*  --   --    ------------------------------------------------------------------------------------------------------------------ estimated creatinine clearance is 76.8 mL/min (by C-G formula based on SCr of 1.13 mg/dL). ------------------------------------------------------------------------------------------------------------------ No results for input(s): HGBA1C in the last 72 hours. ------------------------------------------------------------------------------------------------------------------ No results for input(s): CHOL, HDL, LDLCALC, TRIG, CHOLHDL, LDLDIRECT in the last 72  hours. ------------------------------------------------------------------------------------------------------------------ No results for input(s): TSH, T4TOTAL, T3FREE, THYROIDAB in the last 72 hours.  Invalid input(s): FREET3 ------------------------------------------------------------------------------------------------------------------ No results for input(s): VITAMINB12, FOLATE, FERRITIN, TIBC, IRON, RETICCTPCT in the last 72 hours.  Coagulation profile No results for input(s): INR, PROTIME in the last 168 hours.  No results for input(s): DDIMER in the last 72 hours.  Cardiac Enzymes No results for input(s): CKMB, TROPONINI, MYOGLOBIN in the last 168 hours.  Invalid input(s): CK ------------------------------------------------------------------------------------------------------------------ Invalid input(s): POCBNP    Assessment & Plan   T15-year-old male with history of intracranial hemorrhage x2, short-term memory loss and erythrocytosis who presents with generalized weakness, confusion and fever.  1.  Sepsis:  Due to gram-negative rods in the blood Treat with broad-spectrum IV antibiotic I have asked ID to see Patient also has an abnormal LP cultures negative so far  2.  History of hypertension: We need to obtain medication list Hydralazine PRN  3.  History of intracranial hemorrhage/CVA       Code Status  Orders  (From admission, onward)         Start     Ordered   01/20/19 1235  Full code  Continuous     01/20/19 1235        Code Status History    This patient has a current code status but no historical code status.    Advance Directive Documentation     Most Recent Value  Type of Advance Directive  Healthcare Power of Attorney  Pre-existing out of facility DNR order (yellow form or pink MOST form)  -  "MOST" Form in Place?  -           Consults infectious disease  DVT Prophylaxis  Lovenox  Lab Results  Component Value Date   PLT  131 (L) 01/21/2019     Time Spent in minutes   35 minutes greater than 50% of time spent in care coordination and counseling patient regarding the condition and plan of care.   Auburn Bilberry M.D on 01/21/2019 at 12:34 PM  Between 7am to 6pm - Pager - [phone]  After 6pm go to [email or web address] - Social research officer, government  [employer]   Office  [phone]

## 2019-01-22 ENCOUNTER — Inpatient Hospital Stay: Payer: Medicaid Other

## 2019-01-22 LAB — CBC
HCT: 41.5 % (ref 39.0–52.0)
Hemoglobin: 14 g/dL (ref 13.0–17.0)
MCH: 29.2 pg (ref 26.0–34.0)
MCHC: 33.7 g/dL (ref 30.0–36.0)
MCV: 86.5 fL (ref 80.0–100.0)
NRBC: 0 % (ref 0.0–0.2)
Platelets: 123 10*3/uL — ABNORMAL LOW (ref 150–400)
RBC: 4.8 MIL/uL (ref 4.22–5.81)
RDW: 13.4 % (ref 11.5–15.5)
WBC: 4.9 10*3/uL (ref 4.0–10.5)

## 2019-01-22 LAB — BASIC METABOLIC PANEL
ANION GAP: 8 (ref 5–15)
BUN: 12 mg/dL (ref 6–20)
CO2: 23 mmol/L (ref 22–32)
Calcium: 8.2 mg/dL — ABNORMAL LOW (ref 8.9–10.3)
Chloride: 107 mmol/L (ref 98–111)
Creatinine, Ser: 0.87 mg/dL (ref 0.61–1.24)
GFR calc non Af Amer: >60 mL/min (ref >60)
Glucose, Bld: 106 mg/dL — ABNORMAL HIGH (ref 70–99)
Potassium: 3.2 mmol/L — ABNORMAL LOW (ref 3.5–5.1)
Sodium: 138 mmol/L (ref 135–145)

## 2019-01-22 LAB — HSV DNA BY PCR (REFERENCE LAB)
HSV 1 DNA: NEGATIVE
HSV 2 DNA: NEGATIVE

## 2019-01-22 MED ORDER — APIXABAN 5 MG PO TABS
5.0000 mg | ORAL_TABLET | Freq: Two times a day (BID) | ORAL | Status: DC
  Administered 2019-01-22 – 2019-04-01 (×136): 5 mg via ORAL
  Filled 2019-01-22 (×138): qty 1

## 2019-01-22 MED ORDER — POTASSIUM CHLORIDE CRYS ER 20 MEQ PO TBCR
40.0000 meq | EXTENDED_RELEASE_TABLET | Freq: Once | ORAL | Status: AC
  Administered 2019-01-22: 40 meq via ORAL
  Filled 2019-01-22: qty 2

## 2019-01-22 NOTE — Progress Notes (Signed)
[employer] - [place] at [facility]                                                                                                                                                                                  Patient Demographics   Maxwell Miller, is a 58 y.o. male, DOB - [birth year], AVW:[id number]  Admit date - 01/20/2019   Admitting Physician Adrian Saran, MD  Outpatient Primary MD for the patient is Mable Paris, PA-C   LOS - 2  Subjective:  Patient poor historian unable to provide any review of systems Continues to have fever CT of the abdomen noted  Review of Systems:   CONSTITUTIONAL: Limited history patient not able to provide much  Vitals:   Vitals:   01/21/19 1850 01/21/19 1941 01/22/19 0553 01/22/19 1254  BP:  122/74 125/85   Pulse:  98 98   Resp:  20 17   Temp: 99.3 F (37.4 C) 98.9 F (37.2 C) 99.3 F (37.4 C) 99.4 F (37.4 C)  TempSrc: Oral  Oral Oral  SpO2:  97% 95%   Weight:      Height:        Wt Readings from Last 3 Encounters:  01/20/19 81.6 kg  09/07/18 86.2 kg     Intake/Output Summary (Last 24 hours) at 01/22/2019 1312 Last data filed at 01/21/2019 1802 Gross per 24 hour  Intake 1328.33 ml  Output 100 ml  Net 1228.33 ml    Physical Exam:   GENERAL: Chronically ill-appearing HEAD, EYES, EARS, NOSE AND THROAT: Atraumatic, normocephalic. Extraocular muscles are intact. Pupils equal and reactive to light. Sclerae anicteric. No conjunctival injection. No oro-pharyngeal erythema.  NECK: Supple. There is no jugular venous distention. No bruits, no lymphadenopathy, no thyromegaly.  HEART: Regular rate and rhythm,. No murmurs, no rubs, no clicks.  LUNGS: Clear to auscultation bilaterally. No rales or rhonchi. No wheezes.  ABDOMEN: Soft, flat, nontender, nondistended. Has good bowel sounds. No hepatosplenomegaly appreciated.  EXTREMITIES: No evidence of any cyanosis, clubbing, or peripheral edema.  +2 pedal and radial  pulses bilaterally.  NEUROLOGIC: Patient is awake not oriented SKIN: Moist and warm with no rashes appreciated.  Psych: Not anxious, depressed LN: No inguinal LN enlargement    Antibiotics   Anti-infectives (From admission, onward)   Start     Dose/Rate Route Frequency Ordered Stop   01/21/19 1200  meropenem (MERREM) 1 g in sodium chloride 0.9 % 100 mL IVPB     1 g 200 mL/hr over 30 Minutes Intravenous Every 8 hours 01/21/19 0806     01/21/19 1100  vancomycin (VANCOCIN) 1,750 mg in sodium chloride 0.9 % 500 mL IVPB  Status:  Discontinued     1,750 mg 250 mL/hr over 120 Minutes Intravenous Every 24 hours 01/20/19 1252 01/21/19 0806   01/20/19 1600  ceFEPIme (MAXIPIME) 2 g in sodium chloride 0.9 % 100 mL IVPB  Status:  Discontinued     2 g 200 mL/hr over 30 Minutes Intravenous Every 8 hours 01/20/19 1252 01/21/19 0806   01/20/19 1300  vancomycin (VANCOCIN) IVPB 1000 mg/200 mL premix     1,000 mg 200 mL/hr over 60 Minutes Intravenous  Once 01/20/19 1252 01/20/19 1644   01/20/19 1030  ceFEPIme (MAXIPIME) 1 g in sodium chloride 0.9 % 100 mL IVPB     1 g 200 mL/hr over 30 Minutes Intravenous  Once 01/20/19 1029 01/20/19 1130   01/20/19 1030  vancomycin (VANCOCIN) IVPB 1000 mg/200 mL premix     1,000 mg 200 mL/hr over 60 Minutes Intravenous  Once 01/20/19 1029 01/20/19 1244      Medications   Scheduled Meds: . atorvastatin  20 mg Oral q1800  . enoxaparin (LOVENOX) injection  40 mg Subcutaneous Q24H  . venlafaxine XR  150 mg Oral Q breakfast   Continuous Infusions: . sodium chloride 100 mL/hr at 01/21/19 2041  . meropenem (MERREM) IV 1 g (01/22/19 1253)   PRN Meds:.acetaminophen **OR** acetaminophen, hydrALAZINE, HYDROcodone-acetaminophen, ondansetron **OR** ondansetron (ZOFRAN) IV, polyethylene glycol   Data Review:   Micro Results Recent Results (from the past 240 hour(s))  Blood Culture (routine x 2)     Status: None (Preliminary result)   Collection Time: 01/20/19  8:43  AM  Result Value Ref Range Status   Specimen Description BLOOD RIGHT [facility]  Final   Special Requests   Final    BOTTLES DRAWN AEROBIC AND ANAEROBIC Blood Culture adequate volume   Culture   Final    NO GROWTH 2 DAYS Performed at [facility], [address] [place], [place] [address]    Report Status PENDING  Incomplete  Blood Culture (routine x 2)     Status: Abnormal (Preliminary result)   Collection Time: 01/20/19  9:14 AM  Result Value Ref Range Status   Specimen Description   Final    BLOOD BLOOD LEFT FOREARM Performed at [facility], [address] [place], [place] [address]    Special Requests   Final    BOTTLES DRAWN AEROBIC AND ANAEROBIC Blood Culture results may not be optimal due to an excessive volume of blood received in culture bottles Performed at [facility], [address] [place], [place] [address]    Culture  Setup Time   Final    ANAEROBIC BOTTLE ONLY GRAM NEGATIVE RODS CRITICAL RESULT CALLED TO, READ BACK BY AND VERIFIED WITH: CHRISTIAN MURRELL AT 0753 ON 01/21/2019 JJB    Culture (A)  Final    ESCHERICHIA COLI SUSCEPTIBILITIES TO FOLLOW Performed at [facility], [address] [place], [place] [address]    Report Status PENDING  Incomplete  Blood Culture ID Panel (Reflexed)     Status: Abnormal   Collection Time: 01/20/19  9:14 AM  Result Value Ref Range Status   Enterococcus species NOT DETECTED NOT DETECTED Final   Listeria monocytogenes NOT DETECTED NOT DETECTED Final   Staphylococcus species NOT DETECTED NOT DETECTED Final   Staphylococcus aureus (BCID) NOT DETECTED NOT DETECTED Final   Streptococcus species NOT DETECTED NOT DETECTED Final   Streptococcus agalactiae NOT DETECTED NOT DETECTED Final   Streptococcus pneumoniae NOT DETECTED NOT DETECTED Final   Streptococcus pyogenes NOT  DETECTED NOT DETECTED Final   Acinetobacter baumannii NOT DETECTED NOT DETECTED Final   Enterobacteriaceae species DETECTED  (A) NOT DETECTED Final    Comment: Enterobacteriaceae represent a large family of gram-negative bacteria, not a single organism. CRITICAL RESULT CALLED TO, READ BACK BY AND VERIFIED WITH: CHRISTIAN MURRELL AT 0753 ON 01/21/2019 JJB    Enterobacter cloacae complex NOT DETECTED NOT DETECTED Final   Escherichia coli DETECTED (A) NOT DETECTED Final    Comment: CRITICAL RESULT CALLED TO, READ BACK BY AND VERIFIED WITH: CHRISTIAN MURRELL AT 0753 ON 01/21/2019 JJB    Klebsiella oxytoca NOT DETECTED NOT DETECTED Final   Klebsiella pneumoniae NOT DETECTED NOT DETECTED Final   Proteus species NOT DETECTED NOT DETECTED Final   Serratia marcescens NOT DETECTED NOT DETECTED Final   Carbapenem resistance NOT DETECTED NOT DETECTED Final   Haemophilus influenzae NOT DETECTED NOT DETECTED Final   Neisseria meningitidis NOT DETECTED NOT DETECTED Final   Pseudomonas aeruginosa NOT DETECTED NOT DETECTED Final   Candida albicans NOT DETECTED NOT DETECTED Final   Candida glabrata NOT DETECTED NOT DETECTED Final   Candida krusei NOT DETECTED NOT DETECTED Final   Candida parapsilosis NOT DETECTED NOT DETECTED Final   Candida tropicalis NOT DETECTED NOT DETECTED Final    Comment: Performed at [facility], [address] [place], [place] [address]  Urine culture     Status: None   Collection Time: 01/20/19  9:58 AM  Result Value Ref Range Status   Specimen Description   Final    URINE, RANDOM Performed at [facility], [address] [place], [place] [address]    Special Requests   Final    NONE Performed at [facility], [address], [place], [place] [address]    Culture   Final    NO GROWTH Performed at [facility], [address] [place], [place] [address]    Report Status 01/21/2019 FINAL  Final  MRSA PCR Screening     Status: None   Collection Time: 01/20/19 12:45 PM  Result Value Ref Range Status   MRSA by PCR NEGATIVE NEGATIVE Final    Comment:         The GeneXpert MRSA Assay (FDA approved for NASAL specimens only), is one component of a comprehensive MRSA colonization surveillance program. It is not intended to diagnose MRSA infection nor to guide or monitor treatment for MRSA infections. Performed at [facility], [address] [place], [place] [address]   CSF culture     Status: None (Preliminary result)   Collection Time: 01/20/19  1:32 PM  Result Value Ref Range Status   Specimen Description   Final    CSF Performed at [facility], [address] [place], [place] [address]    Special Requests   Final    NONE Performed at [facility], [address] [place], [place] [address]    Gram Stain   Final    NO ORGANISMS SEEN WBC SEEN RED BLOOD CELLS Performed at [facility], [address], [place], [place] [address]    Culture   Final    NO GROWTH 2 DAYS Performed at [facility], [address] [place], [place] [address]    Report Status PENDING  Incomplete    Radiology Reports Ct Head Wo Contrast  Result Date: 01/20/2019 CLINICAL DATA:  Altered mental status/confusion EXAM: CT HEAD WITHOUT CONTRAST TECHNIQUE: Contiguous axial images were obtained from the base  of the skull through the vertex without intravenous contrast. COMPARISON:  September 07, 2018 FINDINGS: Brain: There is mild diffuse atrophy, stable. There is no intracranial mass, hemorrhage, extra-axial fluid collection, midline shift. There is small vessel disease throughout portions of the centra semiovale bilaterally, stable. There is small vessel disease in each internal capsule, slightly more severe on the right than on the left, stable. No acute appearing infarct is evident. Vascular: There is no appreciable hyperdense vessel. There is no appreciable vascular calcification. Skull: The bony calvarium appears intact. Sinuses/Orbits: There is mucosal thickening in each superior maxillary antrum and several  ethmoid air cells. Visualized orbits appear symmetric bilaterally. Other: Mastoid air cells are clear. IMPRESSION: Stable atrophy with patchy supratentorial small vessel disease which appear stable. No acute infarct evident. No mass or hemorrhage. There is a mild degree of paranasal sinus disease. Electronically Signed   By: Bretta Bang III M.D.   On: 01/20/2019 11:23   Mr Brain Wo Contrast  Result Date: 01/22/2019 CLINICAL DATA:  History of BILATERAL thalamic hemorrhages. Encephalopathy, weakness, confusion, and fever. Sepsis. EXAM: MRI HEAD WITHOUT CONTRAST TECHNIQUE: Multiplanar, multiecho pulse sequences of the brain and surrounding structures were obtained without intravenous contrast. COMPARISON:  CT head 01/20/2019. FINDINGS: Brain: Susceptibility imaging demonstrates chronic BILATERAL thalamic hemorrhages, with subependymal extension, and regional encephalomalacia. This is greater on the LEFT. No features of communicating hydrocephalus. Mild atrophy with chronic microvascular ischemic change, premature for age. No acute stroke, acute hemorrhage, or extra-axial fluid collections. No features concerning for meningitis, within limits for assessment on this noncontrast exam. Prominent perivascular spaces simulate lacunes, and likely represent additional sequelae of hypertension. Vascular: Flow voids are maintained.  Dolichoectasia. Skull and upper cervical spine: Unremarkable. Sinuses/Orbits: No sinus disease.  Negative orbits. Other: None. 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 No evidence of communicating hydrocephalus. No evidence of acute bleed, acute infarction, or features suggestive of meningitis. Electronically Signed   By: Elsie Stain M.D.   On: 01/22/2019 11:27   Dg Chest Port 1 View  Result Date: 01/20/2019 CLINICAL DATA:  58 year old male with fever for 2-3 days EXAM: PORTABLE CHEST 1 VIEW COMPARISON:  09/07/2018  FINDINGS: Cardiomediastinal silhouette unchanged in size and contour. No pneumothorax. No pleural effusion. Coarsened interstitial markings similar to the comparison. No confluent airspace disease. No interlobular septal thickening. No displaced fracture. IMPRESSION: Chronic lung changes without evidence of superimposed acute cardiopulmonary disease. Electronically Signed   By: Gilmer Mor D.O.   On: 01/20/2019 10:01   Ct Angio Abd/pel W/ And/or W/o  Result Date: 01/22/2019 CLINICAL DATA:  history of CVA, thalamic bleed with IVH, HTN , portal vein thrombosis , erythrocytosis was brought to ED by his ex wife who reported that he was having fever, urinary incontinence and generalized weakness. Pt apparently at baseline has some confusion and poor memory following a stroke he had in 2011 and he was confused more than his baselineIn the ED he had a CT scan which did not show any acute changes, He had a temp of 100.7 and underwent LP and started on vanco and cefepime- Blood culture BCID has come back as E.coli and the antibiotics have been changed to Meropenem this afternoon- I am asked to see the patient for fever.Pt does not register fever, He says he has been feeling weak, appetite poor for the past 2 days- says he can walk and manages personal hygiene History of portal vein thrombosis EXAM: CTA ABDOMEN AND PELVIS WITH  CONTRAST TECHNIQUE: Multidetector CT imaging of the abdomen and pelvis was performed using the standard protocol during bolus administration of intravenous contrast. Multiplanar reconstructed images and MIPs were obtained and reviewed to evaluate the vascular anatomy. CONTRAST:  OMNIPAQUE IOHEXOL 350 MG/ML SOLN COMPARISON:  None available FINDINGS: VASCULAR Aorta: Normal caliber aorta without aneurysm, dissection, vasculitis or significant stenosis. Celiac: Patent without evidence of aneurysm, dissection, vasculitis or significant stenosis. SMA: Patent without evidence of aneurysm,  dissection, vasculitis or significant stenosis. Renals: Both renal arteries are patent without evidence of aneurysm, dissection, vasculitis, fibromuscular dysplasia or significant stenosis. IMA: Patent without evidence of aneurysm, dissection, vasculitis or significant stenosis. Inflow: Mild scattered calcified plaque. No aneurysm, dissection, or stenosis. Proximal Outflow: Bilateral common femoral and visualized portions of the superficial and profunda femoral arteries are patent without evidence of aneurysm, dissection, vasculitis or significant stenosis. Veins: Patent hepatic veins. Portal vein thrombosis with some cavernous transformation at the porta hepatis. Enlarged mesenteric venous collateral channels around the pancreatic head, and stomach extending to the splenic hilum. No definite gastric or esophageal varices. Patent bilateral renal veins. Incomplete opacification of the infrarenal IVC and iliac venous system. Review of the MIP images confirms the above findings. NON-VASCULAR Lower chest: No pleural or pericardial effusion. Dependent atelectasis posteriorly in both lung bases. Hepatobiliary: Gallbladder nondistended. No focal liver lesion. No biliary ductal dilatation. Pancreas: Unremarkable. No pancreatic ductal dilatation or surrounding inflammatory changes. Spleen: Small, unremarkable. Adrenals/Urinary Tract: Normal adrenals. 2.9 cm probable cyst in the mid left kidney, and smaller bilateral probable cysts. No renal mass or hydronephrosis. Incomplete distension of the urinary bladder which is moderately thick-walled with surrounding inflammatory/edematous change. Stomach/Bowel: Stomach is decompressed. Small bowel nondilated. Normal appendix. The colon is nondilated. Mild circumferential wall thickening in the distal sigmoid colon and rectum which are nondilated, with some adjacent mild inflammatory/edematous changes in the perirectal fat. Lymphatic: No abdominal or pelvic adenopathy. Reproductive:  Prostatic enlargement Other: No ascites. No free air. Musculoskeletal: No acute or significant osseous findings. IMPRESSION: VASCULAR 1. No significant aortoiliac arterial plaque. 2. No evidence of occlusive mesenteric ischemia. 3. Portal vein thrombosis with cavernous transformation. NON-VASCULAR:. 1. Circumferential wall thickening in the distal sigmoid colon and rectum with adjacent inflammatory/edematous changes suggesting proctitis. 2. Prostatic enlargement with thick-walled urinary bladder Electronically Signed   By: Corlis Leak M.D.   On: 01/22/2019 08:30     CBC Recent Labs  Lab 01/20/19 0843 01/20/19 1245 01/21/19 0311 01/22/19 0620  WBC 29.4* 27.6* 12.4* 4.9  HGB 16.0 14.4 15.4 14.0  HCT 47.0 43.3 46.3 41.5  PLT 136* 128* 131* 123*  MCV 86.7 88.7 88.0 86.5  MCH 29.5 29.5 29.3 29.2  MCHC 34.0 33.3 33.3 33.7  RDW 13.7 13.4 13.7 13.4  LYMPHSABS 1.1  --   --   --   MONOABS 2.4*  --   --   --   EOSABS 0.0  --   --   --   BASOSABS 0.1  --   --   --     Chemistries  Recent Labs  Lab 01/20/19 0843 01/20/19 1245 01/21/19 0311 01/22/19 0620  NA 134*  --  138 138  K 3.9  --  3.4* 3.2*  CL 101  --  104 107  CO2 22  --  25 23  GLUCOSE 172*  --  109* 106*  BUN 22*  --  15 12  CREATININE 1.31* 1.20 1.13 0.87  CALCIUM 8.9  --  8.4* 8.2*  AST 21  --   --   --  ALT 18  --   --   --   ALKPHOS 80  --   --   --   BILITOT 3.0*  --   --   --    ------------------------------------------------------------------------------------------------------------------ estimated creatinine clearance is 99.8 mL/min (by C-G formula based on SCr of 0.87 mg/dL). ------------------------------------------------------------------------------------------------------------------ No results for input(s): HGBA1C in the last 72 hours. ------------------------------------------------------------------------------------------------------------------ No results for input(s): CHOL, HDL, LDLCALC, TRIG,  CHOLHDL, LDLDIRECT in the last 72 hours. ------------------------------------------------------------------------------------------------------------------ No results for input(s): TSH, T4TOTAL, T3FREE, THYROIDAB in the last 72 hours.  Invalid input(s): FREET3 ------------------------------------------------------------------------------------------------------------------ No results for input(s): VITAMINB12, FOLATE, FERRITIN, TIBC, IRON, RETICCTPCT in the last 72 hours.  Coagulation profile No results for input(s): INR, PROTIME in the last 168 hours.  No results for input(s): DDIMER in the last 72 hours.  Cardiac Enzymes No results for input(s): CKMB, TROPONINI, MYOGLOBIN in the last 168 hours.  Invalid input(s): CK ------------------------------------------------------------------------------------------------------------------ Invalid input(s): POCBNP    Assessment & Plan   [age]-year-old male with history of intracranial hemorrhage x2, short-term memory loss and erythrocytosis who presents with generalized weakness, confusion and fever.  1.  Sepsis: CT suggestive of possible proctitis Due to [name] in the blood Continue IV antibiotic Appreciate ID input Patient also has an abnormal LP cultures negative so far   2.  History of hypertension: Patient not taking any medications noncompliance  3.  History of intracranial hemorrhage/CVA MRI of the brain ordered mental status appears to be at baseline  4.  Portal vein thrombosis resume xarelto       Code Status Orders  (From admission, onward)         Start     Ordered   01/20/19 1235  Full code  Continuous     01/20/19 1235        Code Status History    This patient has a current code status but no historical code status.    Advance Directive Documentation     Most Recent Value  Type of Advance Directive  Healthcare Power of Attorney  Pre-existing out of facility DNR order (yellow form or pink MOST form)  -   "MOST" Form in Place?  -           Consults infectious disease  DVT Prophylaxis  Lovenox  Lab Results  Component Value Date   PLT 123 (L) 01/22/2019     Time Spent in minutes   35 minutes greater than 50% of time spent in care coordination and counseling patient regarding the condition and plan of care.   Auburn BilberryShreyang Kenyah Luba M.D on 01/22/2019 at 1:12 PM  Between 7am to 6pm - Pager - [phone]  After 6pm go to [email or web address] - Social research officer, governmentpassword [email or web address] [email or web address]  [employer]   Office  [phone]

## 2019-01-22 NOTE — Progress Notes (Signed)
   01/22/19 1600  Clinical Encounter Type  Visited With Patient  Visit Type Initial  Pt was asleep. F/U needed. Ch went in for AD education.

## 2019-01-23 DIAGNOSIS — N401 Enlarged prostate with lower urinary tract symptoms: Secondary | ICD-10-CM

## 2019-01-23 DIAGNOSIS — N39498 Other specified urinary incontinence: Secondary | ICD-10-CM

## 2019-01-23 DIAGNOSIS — K6289 Other specified diseases of anus and rectum: Secondary | ICD-10-CM

## 2019-01-23 DIAGNOSIS — Z960 Presence of urogenital implants: Secondary | ICD-10-CM

## 2019-01-23 LAB — RPR: RPR Ser Ql: NONREACTIVE

## 2019-01-23 LAB — COMPREHENSIVE METABOLIC PANEL
ALT: 36 U/L (ref 0–44)
AST: 45 U/L — AB (ref 15–41)
Albumin: 2.9 g/dL — ABNORMAL LOW (ref 3.5–5.0)
Alkaline Phosphatase: 61 U/L (ref 38–126)
Anion gap: 8 (ref 5–15)
BUN: 11 mg/dL (ref 6–20)
CO2: 24 mmol/L (ref 22–32)
Calcium: 8.3 mg/dL — ABNORMAL LOW (ref 8.9–10.3)
Chloride: 106 mmol/L (ref 98–111)
Creatinine, Ser: 0.75 mg/dL (ref 0.61–1.24)
GFR calc Af Amer: >60 mL/min (ref >60)
GFR calc non Af Amer: >60 mL/min (ref >60)
Glucose, Bld: 99 mg/dL (ref 70–99)
POTASSIUM: 3.4 mmol/L — AB (ref 3.5–5.1)
Sodium: 138 mmol/L (ref 135–145)
Total Bilirubin: 0.9 mg/dL (ref 0.3–1.2)
Total Protein: 6.9 g/dL (ref 6.5–8.1)

## 2019-01-23 LAB — CULTURE, BLOOD (ROUTINE X 2)

## 2019-01-23 LAB — CBC
HCT: 44.8 % (ref 39.0–52.0)
Hemoglobin: 15 g/dL (ref 13.0–17.0)
MCH: 28.7 pg (ref 26.0–34.0)
MCHC: 33.5 g/dL (ref 30.0–36.0)
MCV: 85.8 fL (ref 80.0–100.0)
Platelets: 124 10*3/uL — ABNORMAL LOW (ref 150–400)
RBC: 5.22 MIL/uL (ref 4.22–5.81)
RDW: 13.3 % (ref 11.5–15.5)
WBC: 7.2 10*3/uL (ref 4.0–10.5)
nRBC: 0 % (ref 0.0–0.2)

## 2019-01-23 LAB — LUPUS ANTICOAGULANT PANEL
DRVVT: 51.3 s — ABNORMAL HIGH (ref 0.0–47.0)
PTT Lupus Anticoagulant: 45 s (ref 0.0–51.9)

## 2019-01-23 LAB — DRVVT MIX: dRVVT Mix: 42.1 s (ref 0.0–47.0)

## 2019-01-23 MED ORDER — SULFAMETHOXAZOLE-TRIMETHOPRIM 800-160 MG PO TABS
2.0000 | ORAL_TABLET | Freq: Two times a day (BID) | ORAL | Status: DC
  Filled 2019-01-23 (×2): qty 2

## 2019-01-23 MED ORDER — POTASSIUM CHLORIDE CRYS ER 20 MEQ PO TBCR
20.0000 meq | EXTENDED_RELEASE_TABLET | Freq: Once | ORAL | Status: AC
  Administered 2019-01-23: 20 meq via ORAL
  Filled 2019-01-23: qty 1

## 2019-01-23 MED ORDER — AMLODIPINE BESYLATE 10 MG PO TABS
10.0000 mg | ORAL_TABLET | Freq: Every day | ORAL | Status: DC
  Administered 2019-01-23 – 2019-04-01 (×66): 10 mg via ORAL
  Filled 2019-01-23 (×70): qty 1

## 2019-01-23 MED ORDER — TAMSULOSIN HCL 0.4 MG PO CAPS
0.4000 mg | ORAL_CAPSULE | Freq: Every day | ORAL | Status: DC
  Administered 2019-01-23 – 2019-04-01 (×67): 0.4 mg via ORAL
  Filled 2019-01-23 (×69): qty 1

## 2019-01-23 MED ORDER — LISINOPRIL 20 MG PO TABS
20.0000 mg | ORAL_TABLET | Freq: Every day | ORAL | Status: DC
  Administered 2019-01-23 – 2019-02-02 (×10): 20 mg via ORAL
  Filled 2019-01-23 (×11): qty 1

## 2019-01-23 MED ORDER — SODIUM CHLORIDE 0.9 % IV SOLN
2.0000 g | Freq: Once | INTRAVENOUS | Status: DC
  Filled 2019-01-23: qty 20

## 2019-01-23 MED ORDER — SODIUM CHLORIDE 0.9 % IV SOLN
2.0000 g | INTRAVENOUS | Status: DC
  Administered 2019-01-23: 2 g via INTRAVENOUS
  Filled 2019-01-23: qty 20
  Filled 2019-01-23: qty 2

## 2019-01-23 MED ORDER — SODIUM CHLORIDE 0.9 % IV SOLN
2.0000 g | INTRAVENOUS | Status: DC
  Filled 2019-01-23: qty 20

## 2019-01-23 NOTE — Evaluation (Signed)
Physical Therapy Evaluation Patient Details Name: Maxwell Miller MRN: [id number] DOB: [birth year] Today's Date: 01/23/2019   History of Present Illness  58 yo male with onset of AMS wtih confusion and fever, sepsis was admitted an dnoted encephalopathy, has recent B thalamic hemorrhages.  PMHx:  portal vein thrombosis, CVA, HTN, erythrocytosis  Clinical Impression  Pt has been seen for mobility assessment but is not covered by insurance for SNF care.  Per MD pt is managed to some degree by his wife, and will anticipate he return home with her care if she is agreeable.  Pt will default to HHPT and acutely will continue therapy to increase his endurance, safety and control of balance for all challenges to decrease his risk of serious injury.    Follow Up Recommendations SNF    Equipment Recommendations  None recommended by PT    Recommendations for Other Services       Precautions / Restrictions Precautions Precautions: Fall(telemetry) Restrictions Weight Bearing Restrictions: No      Mobility  Bed Mobility Overal bed mobility: Needs Assistance Bed Mobility: Supine to Sit;Sit to Supine     Supine to sit: Mod assist Sit to supine: Min guard   General bed mobility comments: mod to lift trunk and min guard to return to bed  Transfers Overall transfer level: Needs assistance Equipment used: Rolling walker (2 wheeled);1 person hand held assist Transfers: Sit to/from Stand Sit to Stand: Mod assist         General transfer comment: mod to power up and min assist to maneuver walker  Ambulation/Gait Ambulation/Gait assistance: Min assist Gait Distance (Feet): 60 Feet Assistive device: Rolling walker (2 wheeled);1 person hand held assist Gait Pattern/deviations: Step-to pattern;Step-through pattern;Decreased stride length;Wide base of support;Drifts right/left;Staggering left Gait velocity: redcued Gait velocity interpretation: <1.31 ft/sec, indicative of household  ambulator General Gait Details: pt is unsteady to L side and then drifts on the hallway  Stairs Stairs: (declined due to safety and not knowing PLOF)          Wheelchair Mobility    Modified Rankin (Stroke Patients Only)       Balance Overall balance assessment: Needs assistance Sitting-balance support: Bilateral upper extremity supported;Feet supported Sitting balance-Leahy Scale: Fair     Standing balance support: Bilateral upper extremity supported;During functional activity Standing balance-Leahy Scale: Poor                               Pertinent Vitals/Pain Pain Assessment: No/denies pain    Home Living Family/patient expects to be discharged to:: Unsure Living Arrangements: Other (Comment)(has an ex wife)                    Prior Function Level of Independence: (pt cannot tell the PT)               Hand Dominance   Dominant Hand: Right    Extremity/Trunk Assessment   Upper Extremity Assessment Upper Extremity Assessment: Generalized weakness    Lower Extremity Assessment Lower Extremity Assessment: Generalized weakness    Cervical / Trunk Assessment Cervical / Trunk Assessment: Normal  Communication   Communication: Expressive difficulties  Cognition Arousal/Alertness: Lethargic Behavior During Therapy: Flat affect;Impulsive Overall Cognitive Status: No family/caregiver present to determine baseline cognitive functioning  General Comments: pt is forgetful and cannot give details of his history and cannot determine if this is baseline      General Comments General comments (skin integrity, edema, etc.): pt is able to sit up and stand, then walk on the hallway but is unsafe to go alone    Exercises     Assessment/Plan    PT Assessment Patient needs continued PT services  PT Problem List Decreased range of motion;Decreased activity tolerance;Decreased balance;Decreased  mobility;Decreased coordination;Decreased knowledge of precautions;Decreased safety awareness;Decreased knowledge of use of DME;Decreased cognition       PT Treatment Interventions DME instruction;Gait training;Functional mobility training;Therapeutic activities;Therapeutic exercise;Balance training;Neuromuscular re-education;Patient/family education    PT Goals (Current goals can be found in the Care Plan section)  Acute Rehab PT Goals Patient Stated Goal: to walk with PT  PT Goal Formulation: With patient Time For Goal Achievement: 02/06/19 Potential to Achieve Goals: Good    Frequency Min 2X/week   Barriers to discharge Decreased caregiver support(per MD he is home alone) may have stairs but is home alone    Co-evaluation               AM-PAC PT "6 Clicks" Mobility  Outcome Measure Help needed turning from your back to your side while in a flat bed without using bedrails?: A Little Help needed moving from lying on your back to sitting on the side of a flat bed without using bedrails?: A Lot Help needed moving to and from a bed to a chair (including a wheelchair)?: A Lot Help needed standing up from a chair using your arms (e.g., wheelchair or bedside chair)?: A Lot Help needed to walk in hospital room?: A Little Help needed climbing 3-5 steps with a railing? : Total 6 Click Score: 13    End of Session Equipment Utilized During Treatment: Gait belt Activity Tolerance: Patient limited by fatigue;Treatment limited secondary to medical complications (Comment)(tremor of extremities and listing laterally) Patient left: in bed;with call bell/phone within reach;with bed alarm set Nurse Communication: Mobility status PT Visit Diagnosis: Unsteadiness on feet (R26.81);Other abnormalities of gait and mobility (R26.89);Adult, failure to thrive (R62.7)    Time: 1255-1315 PT Time Calculation (min) (ACUTE ONLY): 20 min   Charges:   PT Evaluation $PT Eval Moderate Complexity: 1  Mod         Ivar Drape 01/23/2019, 5:30 PM  Samul Dada, PT MS Acute Rehab Dept. Number: [facility] [id number] and [facility] [phone]

## 2019-01-23 NOTE — Plan of Care (Signed)

## 2019-01-23 NOTE — Progress Notes (Signed)
   01/23/19 1500  Clinical Encounter Type  Visited With Patient  Visit Type Follow-up  Ch educated the pt on the AD. Pt's wife is currently his primary caregiver so there is no need for assigning HCPOA. Ch educated the difference between the living will and HCPOA. Pt will reach out to ch if needed.

## 2019-01-23 NOTE — Progress Notes (Signed)
ID: Maxwell Miller is a 58 y.o. male  Active Problems:   Sepsis (HCC)    Subjective: Doing okay Participated with PT_ walked with walker- but wobbly Urinary incontinence - condom cath Bladder scan - 200cc  Medications:  . amLODipine  10 mg Oral Daily  . apixaban  5 mg Oral BID  . atorvastatin  20 mg Oral q1800  . lisinopril  20 mg Oral Daily  . venlafaxine XR  150 mg Oral Q breakfast    Objective: Vital signs in last 24 hours: Temp:  [98.5 F (36.9 C)-99.4 F (37.4 C)] 98.8 F (37.1 C) (03/13 0412) Pulse Rate:  [86-95] 86 (03/13 0412) Resp:  [18-24] 20 (03/13 0412) BP: (146-160)/(102-109) 146/102 (03/13 0412) SpO2:  [97 %-99 %] 98 % (03/13 0412)  PHYSICAL EXAM:  General: Alert, cooperative, no distress, appears stated age.  Facial tremors  Lungs: Clear to auscultation bilaterally. No Wheezing or Rhonchi. No rales. Heart: Regular rate and rhythm, no murmur, rub or gallop. Abdomen: Soft, non-tender,not distended. Bowel sounds normal. No masses Extremities: atraumatic, no cyanosis. No edema. No clubbing Skin: No rashes or lesions. Or bruising Lymph: Cervical, supraclavicular normal. Neurologic: Grossly non-focal  Lab Results Recent Labs    01/22/19 0620 01/23/19 0245  WBC 4.9 7.2  HGB 14.0 15.0  HCT 41.5 44.8  NA 138 138  K 3.2* 3.4*  CL 107 106  CO2 23 24  BUN 12 11  CREATININE 0.87 0.75   Liver Panel Recent Labs    01/23/19 0245  PROT 6.9  ALBUMIN 2.9*  AST 45*  ALT 36  ALKPHOS 61  BILITOT 0.9     Studies/Results: Mr Brain Wo Contrast  Result Date: 01/22/2019 CLINICAL DATA:  History of BILATERAL thalamic hemorrhages. Encephalopathy, weakness, confusion, and fever. Sepsis. EXAM: MRI HEAD WITHOUT CONTRAST TECHNIQUE: Multiplanar, multiecho pulse sequences of the brain and surrounding structures were obtained without intravenous contrast. COMPARISON:  CT head 01/20/2019. FINDINGS: Brain: Susceptibility imaging demonstrates chronic BILATERAL  thalamic hemorrhages, with subependymal extension, and regional encephalomalacia. This is greater on the LEFT. No features of communicating hydrocephalus. Mild atrophy with chronic microvascular ischemic change, premature for age. No acute stroke, acute hemorrhage, or extra-axial fluid collections. No features concerning for meningitis, within limits for assessment on this noncontrast exam. Prominent perivascular spaces simulate lacunes, and likely represent additional sequelae of hypertension. Vascular: Flow voids are maintained.  Dolichoectasia. Skull and upper cervical spine: Unremarkable. Sinuses/Orbits: No sinus disease.  Negative orbits. Other: None. 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 No evidence of communicating hydrocephalus. No evidence of acute bleed, acute infarction, or features suggestive of meningitis. Electronically Signed   By: Elsie Stain M.D.   On: 01/22/2019 11:27   Ct Angio Abd/pel W/ And/or W/o  Result Date: 01/22/2019 CLINICAL DATA:  history of CVA, thalamic bleed with IVH, HTN , portal vein thrombosis , erythrocytosis was brought to ED by his ex wife who reported that he was having fever, urinary incontinence and generalized weakness. Pt apparently at baseline has some confusion and poor memory following a stroke he had in 2011 and he was confused more than his baselineIn the ED he had a CT scan which did not show any acute changes, He had a temp of 100.7 and underwent LP and started on vanco and cefepime- Blood culture BCID has come back as E.coli and the antibiotics have been changed to Meropenem this afternoon- I am asked to see the patient for fever.Pt does  not register fever, He says he has been feeling weak, appetite poor for the past 2 days- says he can walk and manages personal hygiene History of portal vein thrombosis EXAM: CTA ABDOMEN AND PELVIS WITH CONTRAST TECHNIQUE: Multidetector CT imaging of the abdomen  and pelvis was performed using the standard protocol during bolus administration of intravenous contrast. Multiplanar reconstructed images and MIPs were obtained and reviewed to evaluate the vascular anatomy. CONTRAST:  OMNIPAQUE IOHEXOL 350 MG/ML SOLN COMPARISON:  None available FINDINGS: VASCULAR Aorta: Normal caliber aorta without aneurysm, dissection, vasculitis or significant stenosis. Celiac: Patent without evidence of aneurysm, dissection, vasculitis or significant stenosis. SMA: Patent without evidence of aneurysm, dissection, vasculitis or significant stenosis. Renals: Both renal arteries are patent without evidence of aneurysm, dissection, vasculitis, fibromuscular dysplasia or significant stenosis. IMA: Patent without evidence of aneurysm, dissection, vasculitis or significant stenosis. Inflow: Mild scattered calcified plaque. No aneurysm, dissection, or stenosis. Proximal Outflow: Bilateral common femoral and visualized portions of the superficial and profunda femoral arteries are patent without evidence of aneurysm, dissection, vasculitis or significant stenosis. Veins: Patent hepatic veins. Portal vein thrombosis with some cavernous transformation at the porta hepatis. Enlarged mesenteric venous collateral channels around the pancreatic head, and stomach extending to the splenic hilum. No definite gastric or esophageal varices. Patent bilateral renal veins. Incomplete opacification of the infrarenal IVC and iliac venous system. Review of the MIP images confirms the above findings. NON-VASCULAR Lower chest: No pleural or pericardial effusion. Dependent atelectasis posteriorly in both lung bases. Hepatobiliary: Gallbladder nondistended. No focal liver lesion. No biliary ductal dilatation. Pancreas: Unremarkable. No pancreatic ductal dilatation or surrounding inflammatory changes. Spleen: Small, unremarkable. Adrenals/Urinary Tract: Normal adrenals. 2.9 cm probable cyst in the mid left kidney, and  smaller bilateral probable cysts. No renal mass or hydronephrosis. Incomplete distension of the urinary bladder which is moderately thick-walled with surrounding inflammatory/edematous change. Stomach/Bowel: Stomach is decompressed. Small bowel nondilated. Normal appendix. The colon is nondilated. Mild circumferential wall thickening in the distal sigmoid colon and rectum which are nondilated, with some adjacent mild inflammatory/edematous changes in the perirectal fat. Lymphatic: No abdominal or pelvic adenopathy. Reproductive: Prostatic enlargement Other: No ascites. No free air. Musculoskeletal: No acute or significant osseous findings. IMPRESSION: VASCULAR 1. No significant aortoiliac arterial plaque. 2. No evidence of occlusive mesenteric ischemia. 3. Portal vein thrombosis with cavernous transformation. NON-VASCULAR:. 1. Circumferential wall thickening in the distal sigmoid colon and rectum with adjacent inflammatory/edematous changes suggesting proctitis. 2. Prostatic enlargement with thick-walled urinary bladder Electronically Signed   By: Corlis Leak M.D.   On: 01/22/2019 08:30     Assessment/Plan: 58 y.o. male with a history of CVA, thalamic bleed with IVH,  HTN , portal vein thrombosis was brought to ED by his ex wife who reported that he was having fever, urinary incontinence and generalized weakness. Pt apparently at baseline has some confusion and poor memory following a stroke he had in 2011 and he was confused more than his baseline ? ?Fever with confusion more than baseline-resolved  E.coli bacteremia with proctitis seen on CT scan with BPH / bladder wall thickening- likely he has incomplete emptying. Currently on meropenem- changed to ceftriaxone- on discharge can switch to Bactrim DS 1 BID for 10 days Will need to follow up with urology Also need to get colonoscopy/sigmoidoscopy as OP  LP was done and was  traumatic as the first tube had 3418 RBC and 17 wbc and tube 4 had 0 wbc and  212 RBC His clinical picture does  not fit with bacterial meningitis or herpetic encephalitis -HSV neg   H/o CVA with b/l thalamic bleeds in 2011.2015 due to hypertension  H/o extensive portal vein thrombosis with no cause in 2017 -some residual thrombosis  HTN: management as per primary team ? Discussed the management with hospitalist  ID will sign off- call if needed

## 2019-01-23 NOTE — Progress Notes (Signed)
[facility] [place] at [facility]   PATIENT NAME: Maxwell Miller    MR#:  [id number]  DATE OF BIRTH:  03-Jan-1961  SUBJECTIVE:  patient denies any complaints. He is not best of the historians. All the questions I asked he says he does not know. No family in the room. No fever. Eating relatively okay. Feels weak.  REVIEW OF SYSTEMS:   Review of Systems  Constitutional: Negative for chills, fever and weight loss.  HENT: Negative for ear discharge, ear pain and nosebleeds.   Eyes: Negative for blurred vision, pain and discharge.  Respiratory: Negative for sputum production, shortness of breath, wheezing and stridor.   Cardiovascular: Negative for chest pain, palpitations, orthopnea and PND.  Gastrointestinal: Negative for abdominal pain, diarrhea, nausea and vomiting.  Genitourinary: Negative for frequency and urgency.  Musculoskeletal: Negative for back pain and joint pain.  Neurological: Positive for weakness. Negative for sensory change, speech change and focal weakness.  Psychiatric/Behavioral: Negative for depression and hallucinations. The patient is not nervous/anxious.    Tolerating Diet:yes Tolerating PT: pending  DRUG ALLERGIES:  No Known Allergies  VITALS:  Blood pressure (!) 146/102, pulse 86, temperature 98.8 F (37.1 C), resp. rate 20, height 5\' 11"  (1.803 m), weight 81.6 kg, SpO2 98 %.  PHYSICAL EXAMINATION:   Physical Exam  GENERAL:  58 y.o.-year-old patient lying in the bed with no acute distress.  EYES: Pupils equal, round, reactive to light and accommodation. No scleral icterus. Extraocular muscles intact.  HEENT: Head atraumatic, normocephalic. Oropharynx and nasopharynx clear.  NECK:  Supple, no jugular venous distention. No thyroid enlargement, no tenderness.  LUNGS: Normal breath sounds bilaterally, no wheezing, rales, rhonchi. No use of accessory muscles of respiration.  CARDIOVASCULAR: S1, S2 normal. No murmurs, rubs, or  gallops.  ABDOMEN: Soft, nontender, nondistended. Bowel sounds present. No organomegaly or mass. Condom catheter EXTREMITIES: No cyanosis, clubbing or edema b/l.    NEUROLOGIC: Cranial nerves II through XII are intact. No focal Motor or sensory deficits b/l.   PSYCHIATRIC:  patient is alert and oriented x 3.  SKIN: No obvious rash, lesion, or ulcer.   LABORATORY PANEL:  CBC Recent Labs  Lab 01/23/19 0245  WBC 7.2  HGB 15.0  HCT 44.8  PLT 124*    Chemistries  Recent Labs  Lab 01/23/19 0245  NA 138  K 3.4*  CL 106  CO2 24  GLUCOSE 99  BUN 11  CREATININE 0.75  CALCIUM 8.3*  AST 45*  ALT 36  ALKPHOS 61  BILITOT 0.9   Cardiac Enzymes No results for input(s): TROPONINI in the last 168 hours. RADIOLOGY:  Mr Brain Wo Contrast  Result Date: 01/22/2019 CLINICAL DATA:  History of BILATERAL thalamic hemorrhages. Encephalopathy, weakness, confusion, and fever. Sepsis. EXAM: MRI HEAD WITHOUT CONTRAST TECHNIQUE: Multiplanar, multiecho pulse sequences of the brain and surrounding structures were obtained without intravenous contrast. COMPARISON:  CT head 01/20/2019. FINDINGS: Brain: Susceptibility imaging demonstrates chronic BILATERAL thalamic hemorrhages, with subependymal extension, and regional encephalomalacia. This is greater on the LEFT. No features of communicating hydrocephalus. Mild atrophy with chronic microvascular ischemic change, premature for age. No acute stroke, acute hemorrhage, or extra-axial fluid collections. No features concerning for meningitis, within limits for assessment on this noncontrast exam. Prominent perivascular spaces simulate lacunes, and likely represent additional sequelae of hypertension. Vascular: Flow voids are maintained.  Dolichoectasia. Skull and upper cervical spine: Unremarkable. Sinuses/Orbits: No sinus disease.  Negative orbits. Other: None. IMPRESSION: Chronic BILATERAL thalamic hemorrhages are most consistent  with prior hypertensive insults. No  evidence of communicating hydrocephalus. No evidence of acute bleed, acute infarction, or features suggestive of meningitis. Electronically Signed   By: Elsie Stain M.D.   On: 01/22/2019 11:27   Ct Angio Abd/pel W/ And/or W/o  Result Date: 01/22/2019 CLINICAL DATA:  history of CVA, thalamic bleed with IVH, HTN , portal vein thrombosis , erythrocytosis was brought to ED by his ex wife who reported that he was having fever, urinary incontinence and generalized weakness. Pt apparently at baseline has some confusion and poor memory following a stroke he had in 2011 and he was confused more than his baselineIn the ED he had a CT scan which did not show any acute changes, He had a temp of 100.7 and underwent LP and started on vanco and cefepime- Blood culture BCID has come back as E.coli and the antibiotics have been changed to Meropenem this afternoon- I am asked to see the patient for fever.Pt does not register fever, He says he has been feeling weak, appetite poor for the past 2 days- says he can walk and manages personal hygiene History of portal vein thrombosis EXAM: CTA ABDOMEN AND PELVIS WITH CONTRAST TECHNIQUE: Multidetector CT imaging of the abdomen and pelvis was performed using the standard protocol during bolus administration of intravenous contrast. Multiplanar reconstructed images and MIPs were obtained and reviewed to evaluate the vascular anatomy. CONTRAST:  OMNIPAQUE IOHEXOL 350 MG/ML SOLN COMPARISON:  None available FINDINGS: VASCULAR Aorta: Normal caliber aorta without aneurysm, dissection, vasculitis or significant stenosis. Celiac: Patent without evidence of aneurysm, dissection, vasculitis or significant stenosis. SMA: Patent without evidence of aneurysm, dissection, vasculitis or significant stenosis. Renals: Both renal arteries are patent without evidence of aneurysm, dissection, vasculitis, fibromuscular dysplasia or significant stenosis. IMA: Patent without evidence of aneurysm,  dissection, vasculitis or significant stenosis. Inflow: Mild scattered calcified plaque. No aneurysm, dissection, or stenosis. Proximal Outflow: Bilateral common femoral and visualized portions of the superficial and profunda femoral arteries are patent without evidence of aneurysm, dissection, vasculitis or significant stenosis. Veins: Patent hepatic veins. Portal vein thrombosis with some cavernous transformation at the porta hepatis. Enlarged mesenteric venous collateral channels around the pancreatic head, and stomach extending to the splenic hilum. No definite gastric or esophageal varices. Patent bilateral renal veins. Incomplete opacification of the infrarenal IVC and iliac venous system. Review of the MIP images confirms the above findings. NON-VASCULAR Lower chest: No pleural or pericardial effusion. Dependent atelectasis posteriorly in both lung bases. Hepatobiliary: Gallbladder nondistended. No focal liver lesion. No biliary ductal dilatation. Pancreas: Unremarkable. No pancreatic ductal dilatation or surrounding inflammatory changes. Spleen: Small, unremarkable. Adrenals/Urinary Tract: Normal adrenals. 2.9 cm probable cyst in the mid left kidney, and smaller bilateral probable cysts. No renal mass or hydronephrosis. Incomplete distension of the urinary bladder which is moderately thick-walled with surrounding inflammatory/edematous change. Stomach/Bowel: Stomach is decompressed. Small bowel nondilated. Normal appendix. The colon is nondilated. Mild circumferential wall thickening in the distal sigmoid colon and rectum which are nondilated, with some adjacent mild inflammatory/edematous changes in the perirectal fat. Lymphatic: No abdominal or pelvic adenopathy. Reproductive: Prostatic enlargement Other: No ascites. No free air. Musculoskeletal: No acute or significant osseous findings. IMPRESSION: VASCULAR 1. No significant aortoiliac arterial plaque. 2. No evidence of occlusive mesenteric ischemia. 3.  Portal vein thrombosis with cavernous transformation. NON-VASCULAR:. 1. Circumferential wall thickening in the distal sigmoid colon and rectum with adjacent inflammatory/edematous changes suggesting proctitis. 2. Prostatic enlargement with thick-walled urinary bladder Electronically Signed   By: Algis Downs  Deanne Coffer M.D.   On: 01/22/2019 08:30   ASSESSMENT AND PLAN:   58 year old male with history of intracranial hemorrhage x2, short-term memory loss and erythrocytosis who presents with generalized weakness, confusion and fever.  1. Sepsis:likely due to CT suggestive of possible proctitis Due to [name] in the blood Continue IV antibiotic with rocephin and change to po bactrim DS for 10 days per Dr Rivka Safer Appreciate ID input Patient also has an abnormal LP cultures negative so far Afebrile, wbc normal  2. History of hypertension: Patient not taking any medications noncompliance  3. History of intracranial hemorrhage/CVA MRI of the brain ordered mental status appears to be at baseline  4.  Portal vein thrombosis resume xarelto  5. Generalized weakness. Physical therapy to see patient.   Patient's discharge pending PT eval. Left message for patient's wife [name]  Case discussed with Care Management/Social Worker.  CODE STATUS: full  DVT Prophylaxis: xarelto  TOTAL TIME TAKING CARE OF THIS PATIENT: *30* minutes.  >50% time spent on counselling and coordination of care  POSSIBLE D/C  TODAY  DEPENDING ON CLINICAL CONDITION.  Note: This dictation was prepared with Dragon dictation along with smaller phrase technology. Any transcriptional errors that result from this process are unintentional.  Enedina Finner M.D on 01/23/2019 at 1:28 PM  Between 7am to 6pm - Pager - [phone]  After 6pm go to [email or web address] - password [email or web address]  [facility]  Office  [phone]  CC: Primary care physician; Mable Paris, PA-CPatient ID: Maxwell Miller, male   DOB:  10-28-1961, 58 y.o.   MRN: [id number]

## 2019-01-24 LAB — GLUCOSE, CAPILLARY: Glucose-Capillary: 116 mg/dL — ABNORMAL HIGH (ref 70–99)

## 2019-01-24 LAB — CARDIOLIPIN ANTIBODIES, IGG, IGM, IGA
ANTICARDIOLIPIN IGA: 10 U/mL (ref 0–11)
Anticardiolipin IgG: <9 GPL U/mL (ref 0–14)
Anticardiolipin IgM: <9 MPL U/mL (ref 0–12)

## 2019-01-24 LAB — CSF CULTURE W GRAM STAIN
Culture: NO GROWTH
Gram Stain: NONE SEEN

## 2019-01-24 MED ORDER — TAMSULOSIN HCL 0.4 MG PO CAPS: 0.4000 mg | ORAL_CAPSULE | Freq: Every day | ORAL | 0 refills | Status: AC

## 2019-01-24 MED ORDER — SULFAMETHOXAZOLE-TRIMETHOPRIM 800-160 MG PO TABS: 1.0000 | ORAL_TABLET | Freq: Two times a day (BID) | ORAL | 0 refills | Status: DC

## 2019-01-24 MED ORDER — SULFAMETHOXAZOLE-TRIMETHOPRIM 800-160 MG PO TABS
1.0000 | ORAL_TABLET | Freq: Two times a day (BID) | ORAL | Status: DC
  Administered 2019-01-24 – 2019-01-28 (×9): 1 via ORAL
  Filled 2019-01-24 (×9): qty 1

## 2019-01-24 NOTE — Discharge Summary (Signed)
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 Hypertension   . Stroke [facility])     HOSPITAL COURSE:   58 year old male with history of intracranial hemorrhage x2, short-term memory loss and erythrocytosis who presents with generalized weakness, confusion and fever.  1. Sepsis:likely due toCT suggestive of possible proctitis Due toE. coli in the blood Continue IV antibiotic with rocephin and change to po bactrim DS for total 10 days per Dr Rivka Safer Appreciate ID input Patient also has an abnormal LP cultures negative so far Afebrile, wbc normal  2. History of hypertension: -resumed BP meds -pt has issues with non compliance  3. History of intracranial hemorrhage/CVAMRI of the brain ordered mental status appears to be at baseline  4.Portal vein thrombosis - cotn eliquis  5. Generalized weakness. Physical therapy eval noted. Pt has no issurance.  Will try to get home PT-charity if possible  D/c to home today. Left message for patient's wife [name] x 2  She has not called me yet!!  CM for d/c planning CONSULTS OBTAINED:  Treatment Team:  Lynn Ito, MD  DRUG ALLERGIES:  No Known Allergies  DISCHARGE MEDICATIONS:   Allergies as of 01/24/2019   No Known Allergies     Medication List    TAKE these medications   amLODipine 10 MG tablet Commonly known as:  NORVASC Take 10 mg by mouth daily.   apixaban 5 MG  Tabs tablet Commonly known as:  ELIQUIS Take 5 mg by mouth 2 (two) times daily.   atorvastatin 20 MG tablet Commonly known as:  LIPITOR Take 20 mg by mouth daily.   clonazePAM 1 MG tablet Commonly known as:  KLONOPIN Take 1 mg by mouth 2 (two) times daily.   lisinopril 40 MG tablet Commonly known as:  PRINIVIL,ZESTRIL Take 20 mg by mouth daily.   QUEtiapine 50 MG tablet Commonly known as:  SEROQUEL Take 50 mg by mouth daily.   sulfamethoxazole-trimethoprim 800-160 MG tablet Commonly known as:  BACTRIM DS,SEPTRA DS Take 1 tablet by mouth every 12 (twelve) hours.   tamsulosin 0.4 MG Caps capsule Commonly known as:  FLOMAX Take 1 capsule (0.4 mg total) by mouth daily.   venlafaxine XR 150 MG 24 hr capsule Commonly known as:  EFFEXOR-XR Take 150 mg by mouth daily.            Durable Medical Equipment  (From admission, onward)         Start     Ordered   01/24/19 0749  For home use only DME Walker tall  [facility])  Once    Question:  Patient needs a walker to treat with the following condition  Answer:  Generalized weakness   01/24/19 0749          If you experience worsening of your admission symptoms, develop shortness of breath, life threatening emergency, suicidal or homicidal thoughts you must seek medical attention immediately by calling 911 or calling your MD immediately  if symptoms less severe.  You Must read complete instructions/literature along with all the possible adverse reactions/side effects for all the Medicines you take and that have been prescribed to you. Take any new Medicines after you have completely understood and accept all the possible adverse reactions/side effects.   Please note  You were cared for by a hospitalist during your hospital stay. If you have any questions about your discharge medications or the care you received while you were in the hospital after you are discharged, you can call the unit and asked to speak with the  hospitalist on call if the hospitalist that took care of you is not available. Once you are discharged, your primary care physician will handle any further medical issues. Please note that NO REFILLS for any discharge medications will be authorized once you are discharged, as it is imperative that you return to your primary care physician (or establish a relationship with a primary care physician if you do not have one) for your aftercare needs so that they can reassess your need for medications and monitor your lab values. Today   SUBJECTIVE   No new complaints  VITAL SIGNS:  Blood pressure 123/84, pulse 92, temperature 98.3 F (36.8 C), temperature source Oral, resp. rate 16, height 5\' 11"  (1.803 m), weight 81.6 kg, SpO2 100 %.  I/O:    Intake/Output Summary (Last 24 hours) at 01/24/2019 0749 Last data filed at 01/24/2019 0720 Gross per 24 hour  Intake 2704.76 ml  Output 2000 ml  Net 704.76 ml    PHYSICAL EXAMINATION:  GENERAL:  58 y.o.-year-old patient lying in the bed with no acute distress.  EYES: Pupils equal, round, reactive to light and accommodation. No scleral icterus. Extraocular muscles intact.  HEENT: Head atraumatic, normocephalic. Oropharynx and nasopharynx clear.  NECK:  Supple, no jugular venous distention. No thyroid enlargement, no tenderness.  LUNGS: Normal breath sounds bilaterally, no wheezing, rales,rhonchi or crepitation. No use of accessory muscles of respiration.  CARDIOVASCULAR: S1, S2 normal. No murmurs, rubs, or gallops.  ABDOMEN: Soft, non-tender, non-distended. Bowel sounds present. No organomegaly or mass.  EXTREMITIES: No pedal edema, cyanosis, or clubbing.  NEUROLOGIC: Cranial nerves II through XII are intact. Muscle strength 5/5 in all extremities. Sensation intact. Gait not checked.  PSYCHIATRIC: The patient is alert and oriented x 3.  SKIN: No obvious rash, lesion, or ulcer.   DATA REVIEW:   CBC  Recent Labs  Lab 01/23/19 0245  WBC 7.2  HGB  15.0  HCT 44.8  PLT 124*    Chemistries  Recent Labs  Lab 01/23/19 0245  NA 138  K 3.4*  CL 106  CO2 24  GLUCOSE 99  BUN 11  CREATININE 0.75  CALCIUM 8.3*  AST 45*  ALT 36  ALKPHOS 61  BILITOT 0.9    Microbiology Results   Recent Results (from the past 240 hour(s))  Blood Culture (routine x 2)     Status: None (Preliminary result)   Collection Time: 01/20/19  8:43 AM  Result Value Ref Range Status   Specimen Description BLOOD RIGHT [facility]  Final   Special Requests   Final    BOTTLES DRAWN AEROBIC AND ANAEROBIC Blood Culture adequate volume   Culture   Final    NO GROWTH 4 DAYS Performed at [facility], [address] [place], [place] [address]    Report Status PENDING  Incomplete  Blood Culture (routine x 2)     Status: Abnormal   Collection Time: 01/20/19  9:14 AM  Result Value Ref Range Status   Specimen Description   Final    BLOOD BLOOD LEFT FOREARM Performed at [facility], [address] [place], [place] [address]    Special Requests   Final    BOTTLES DRAWN AEROBIC AND ANAEROBIC Blood Culture results may not be optimal due to an excessive volume of blood received in culture bottles Performed at [facility], [address] [place], [place] [address]    Culture  Setup Time   Final    ANAEROBIC BOTTLE ONLY GRAM NEGATIVE RODS CRITICAL RESULT CALLED TO, READ BACK BY AND VERIFIED WITH: CHRISTIAN MURRELL AT 0753 ON 01/21/2019 JJB Performed at [facility], [address], [place], [place] [address]    Culture ESCHERICHIA COLI (A)  Final   Report Status 01/23/2019 FINAL  Final   Organism ID, Bacteria ESCHERICHIA COLI  Final      Susceptibility   Escherichia coli - MIC*    AMPICILLIN <=2 SENSITIVE Sensitive     CEFAZOLIN <=4 SENSITIVE Sensitive     CEFEPIME <=1 SENSITIVE Sensitive     CEFTAZIDIME <=1 SENSITIVE Sensitive     CEFTRIAXONE <=1 SENSITIVE Sensitive     CIPROFLOXACIN >=4 RESISTANT Resistant     GENTAMICIN  <=1 SENSITIVE Sensitive     IMIPENEM <=0.25 SENSITIVE Sensitive     TRIMETH/SULFA <=20 SENSITIVE Sensitive     AMPICILLIN/SULBACTAM <=2 SENSITIVE Sensitive     PIP/TAZO <=4 SENSITIVE Sensitive     Extended ESBL NEGATIVE Sensitive     * ESCHERICHIA COLI  Blood Culture ID Panel (Reflexed)     Status: Abnormal   Collection Time: 01/20/19  9:14 AM  Result Value Ref Range Status   Enterococcus species NOT DETECTED NOT DETECTED Final   Listeria monocytogenes NOT DETECTED NOT DETECTED Final   Staphylococcus species NOT DETECTED NOT DETECTED Final   Staphylococcus aureus (BCID) NOT DETECTED NOT DETECTED Final   Streptococcus species NOT DETECTED NOT DETECTED Final   Streptococcus agalactiae NOT DETECTED NOT DETECTED Final   Streptococcus pneumoniae NOT DETECTED NOT DETECTED Final   Streptococcus pyogenes NOT DETECTED NOT DETECTED Final   Acinetobacter baumannii NOT DETECTED NOT DETECTED Final   Enterobacteriaceae species DETECTED (A) NOT DETECTED Final    Comment: Enterobacteriaceae represent a large family of gram-negative bacteria, not a single organism. CRITICAL RESULT CALLED TO, READ BACK BY AND VERIFIED WITH: CHRISTIAN MURRELL AT 0753 ON 01/21/2019 JJB    Enterobacter cloacae complex NOT DETECTED NOT DETECTED Final   Escherichia coli DETECTED (A) NOT DETECTED Final    Comment: CRITICAL RESULT CALLED TO, READ BACK BY AND VERIFIED WITH: CHRISTIAN MURRELL AT 0753 ON 01/21/2019 JJB    Klebsiella oxytoca NOT DETECTED NOT DETECTED Final   Klebsiella pneumoniae NOT DETECTED NOT DETECTED Final   Proteus species NOT DETECTED NOT DETECTED Final   Serratia marcescens NOT DETECTED NOT DETECTED Final   Carbapenem resistance NOT DETECTED NOT DETECTED Final   Haemophilus influenzae NOT DETECTED NOT DETECTED Final   Neisseria meningitidis NOT DETECTED NOT DETECTED Final   Pseudomonas aeruginosa NOT DETECTED NOT DETECTED Final   Candida albicans NOT DETECTED NOT DETECTED Final   Candida glabrata NOT  DETECTED NOT DETECTED Final   Candida krusei NOT DETECTED NOT DETECTED Final   Candida parapsilosis NOT DETECTED NOT DETECTED Final   Candida tropicalis NOT DETECTED NOT DETECTED Final    Comment: Performed at [facility], [address], [place], [place] [address]  Urine culture  Status: None   Collection Time: 01/20/19  9:58 AM  Result Value Ref Range Status   Specimen Description   Final    URINE, RANDOM Performed at [facility], [address], [place], [place] [address]    Special Requests   Final    NONE Performed at [facility], [address], [place], [place] [address]    Culture   Final    NO GROWTH Performed at [facility], [address], [place], [place] [address]    Report Status 01/21/2019 FINAL  Final  MRSA PCR Screening     Status: None   Collection Time: 01/20/19 12:45 PM  Result Value Ref Range Status   MRSA by PCR NEGATIVE NEGATIVE Final    Comment:        The GeneXpert MRSA Assay (FDA approved for NASAL specimens only), is one component of a comprehensive MRSA colonization surveillance program. It is not intended to diagnose MRSA infection nor to guide or monitor treatment for MRSA infections. Performed at [facility], [address] [place], [place] [address]   CSF culture     Status: None (Preliminary result)   Collection Time: 01/20/19  1:32 PM  Result Value Ref Range Status   Specimen Description   Final    CSF Performed at [facility], [address], [place], [place] [address]    Special Requests   Final    NONE Performed at [facility], [address] [place], [place] [address]    Gram Stain   Final    NO ORGANISMS SEEN WBC SEEN RED BLOOD CELLS Performed at [facility], [address], [place], [place] [address]    Culture   Final    NO GROWTH 3 DAYS Performed at [facility], [address], [place], [place] [address]    Report Status  PENDING  Incomplete    RADIOLOGY:  Mr Brain Wo Contrast  Result Date: 01/22/2019 CLINICAL DATA:  History of BILATERAL thalamic hemorrhages. Encephalopathy, weakness, confusion, and fever. Sepsis. EXAM: MRI HEAD WITHOUT CONTRAST TECHNIQUE: Multiplanar, multiecho pulse sequences of the brain and surrounding structures were obtained without intravenous contrast. COMPARISON:  CT head 01/20/2019. FINDINGS: Brain: Susceptibility imaging demonstrates chronic BILATERAL thalamic hemorrhages, with subependymal extension, and regional encephalomalacia. This is greater on the LEFT. No features of communicating hydrocephalus. Mild atrophy with chronic microvascular ischemic change, premature for age. No acute stroke, acute hemorrhage, or extra-axial fluid collections. No features concerning for meningitis, within limits for assessment on this noncontrast exam. Prominent perivascular spaces simulate lacunes, and likely represent additional sequelae of hypertension. Vascular: Flow voids are maintained.  Dolichoectasia. Skull and upper cervical spine: Unremarkable. Sinuses/Orbits: No sinus disease.  Negative orbits. Other: None. 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 No evidence of communicating hydrocephalus. No evidence of acute bleed, acute infarction, or features suggestive of meningitis. Electronically Signed   By: Elsie Stain M.D.   On: 01/22/2019 11:27     CODE STATUS:     Code Status Orders  (From admission, onward)         Start     Ordered   01/20/19 1235  Full code  Continuous     01/20/19 1235        Code Status History    This patient has a current code status but no historical code status.    Advance Directive Documentation     Most Recent  Value  Type of Advance Directive  Healthcare Power of Attorney  Pre-existing out of facility DNR order (yellow form or pink MOST form)  -  "MOST" Form in Place?  -      TOTAL TIME  TAKING CARE OF THIS PATIENT: 40  minutes.    Enedina FinnerSona Sama Arauz M.D on 01/24/2019 at 7:49 AM  Between 7am to 6pm - Pager - [phone] After 6pm go to [email or web address] - Social research officer, [email or web address] [email or web address]  [facility]  Office  [phone]  CC: Primary care physician; Mable ParisWilliams, Katrina, PA-C

## 2019-01-24 NOTE — Plan of Care (Signed)
  Problem: Pain Managment: Goal: General experience of comfort will improve Outcome: Completed/Met   Problem: Safety: Goal: Ability to remain free from injury will improve Outcome: Completed/Met
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
## 2019-01-25 LAB — CULTURE, BLOOD (ROUTINE X 2)
CULTURE: NO GROWTH
Special Requests: ADEQUATE

## 2019-01-25 LAB — GLUCOSE, CAPILLARY: Glucose-Capillary: 127 mg/dL — ABNORMAL HIGH (ref 70–99)
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
## 2019-01-26 NOTE — Progress Notes (Signed)
[facility] [place] at [facility]   PATIENT NAME: Maxwell Miller    MR#:  [id number]  DATE OF BIRTH:  Sep 19, 1961  SUBJECTIVE:   Denies any complaints. He sat out of bed in the chair several hours yesterday REVIEW OF SYSTEMS:   Review of Systems  Constitutional: Negative for chills, fever and weight loss.  HENT: Negative for ear discharge, ear pain and nosebleeds.   Eyes: Negative for blurred vision, pain and discharge.  Respiratory: Negative for sputum production, shortness of breath, wheezing and stridor.   Cardiovascular: Negative for chest pain, palpitations, orthopnea and PND.  Gastrointestinal: Negative for abdominal pain, diarrhea, nausea and vomiting.  Genitourinary: Negative for frequency and urgency.  Musculoskeletal: Negative for back pain and joint pain.  Neurological: Positive for weakness. Negative for sensory change, speech change and focal weakness.  Psychiatric/Behavioral: Positive for memory loss. Negative for depression and hallucinations. The patient is not nervous/anxious.    Tolerating Diet:yes Tolerating PT: yes  DRUG ALLERGIES:  No Known Allergies  VITALS:  Blood pressure 121/79, pulse 72, temperature 98.4 F (36.9 C), temperature source Oral, resp. rate 18, height 5\' 11"  (1.803 m), weight 81.6 kg, SpO2 98 %.  PHYSICAL EXAMINATION:   Physical Exam  GENERAL:  58 y.o.-year-old patient lying in the bed with no acute distress.  EYES: Pupils equal, round, reactive to light and accommodation. No scleral icterus. Extraocular muscles intact.  HEENT: Head atraumatic, normocephalic. Oropharynx and nasopharynx clear.  NECK:  Supple, no jugular venous distention. No thyroid enlargement, no tenderness.  LUNGS: Normal breath sounds bilaterally, no wheezing, rales, rhonchi. No use of accessory muscles of respiration.  CARDIOVASCULAR: S1, S2 normal. No murmurs, rubs, or gallops.  ABDOMEN: Soft, nontender, nondistended. Bowel sounds  present. No organomegaly or mass.  EXTREMITIES: No cyanosis, clubbing or edema b/l.    NEUROLOGIC: Cranial nerves II through XII are intact. No focal Motor or sensory deficits b/l.   PSYCHIATRIC:  patient is alert and oriented x 3.  SKIN: No obvious rash, lesion, or ulcer.   LABORATORY PANEL:  CBC Recent Labs  Lab 01/23/19 0245  WBC 7.2  HGB 15.0  HCT 44.8  PLT 124*    Chemistries  Recent Labs  Lab 01/23/19 0245  NA 138  K 3.4*  CL 106  CO2 24  GLUCOSE 99  BUN 11  CREATININE 0.75  CALCIUM 8.3*  AST 45*  ALT 36  ALKPHOS 61  BILITOT 0.9   Cardiac Enzymes No results for input(s): TROPONINI in the last 168 hours. RADIOLOGY:  No results found. ASSESSMENT AND PLAN:  58 year old male with history of intracranial hemorrhage x2, short-term memory loss and erythrocytosis who presents with generalized weakness, confusion and fever.  1. Sepsis:likely due toCT suggestive of possible proctitis Due toE. coli in the blood Continue Ipo bactrim DS for total 10 days per Dr Rivka Safer Appreciate ID input Patient also has an abnormal LP cultures negative so far Afebrile, wbc normal  2. History of hypertension: -resumed BP meds -pt has issues with non compliance  3. History of intracranial hemorrhage/CVAMRI of the brain ordered mental status appears to be at baseline  4.Portal vein thrombosis - cotn eliquis  5. Generalized weakness. Physical therapy eval noted. Pt has no issurance.  Will try to get home PT-charity if possible   Left message for patient's wife [name] She called the  CM--and has REFUSED to take the patient back APS report to be filed today   CODE STATUS: full  DVT  Prophylaxis: *eliquis*  TOTAL TIME TAKING CARE OF THIS PATIENT: *30* minutes.  >50% time spent on counselling and coordination of care  POSSIBLE D/C IN *?* DAYS, DEPENDING ON CLINICAL CONDITION.  Note: This dictation was prepared with Dragon dictation along with  smaller phrase technology. Any transcriptional errors that result from this process are unintentional.  Enedina Finner M.D on 01/26/2019 at 2:09 PM  Between 7am to 6pm - Pager - [phone]  After 6pm go to [email or web address] - password [email or web address]  [facility]  Office  [phone]  CC: Primary care physician; Mable Paris, PA-CPatient ID: [name], male   DOB: [birth year], 58 y.o.   MRN: [id number]

## 2019-01-26 NOTE — Progress Notes (Signed)
Physical Therapy Treatment Patient Details Name: Maxwell Miller MRN: [id number] DOB: 05/25/1961 Today's Date: 01/26/2019    History of Present Illness 58 yo male with onset of AMS wtih confusion and fever, sepsis was admitted an dnoted encephalopathy, has recent B thalamic hemorrhages.  PMHx:  portal vein thrombosis, CVA, HTN, erythrocytosis    PT Comments    Patient in bed, agreeable to PT, no complaints of pain. Pt with some impulsivity with mobility this session, easily redirected and followed all commands. Patient demonstrated improved activity tolerance and needed less physical assist compared to prior session. Patient able to mobilize to EOB with supervision and heavy use of bed rails. Sit <> stand with CGA and extensive verbal cues to increase safety, proper sequencing of movements, and hand placement. Ambulated >141ft with RW and CGA. Unsteadiness/shakiness noted with ambulation, but no major LOB. Step through gait pattern utilized, some drifting noted. The patient would benefit from further skilled PT to continue to progress towards goals, improve safety and mobility, and further AD training. Discharge recommendations updated to HHPT with supervision for mobility/OOB.     Follow Up Recommendations  Home health PT;Supervision for mobility/OOB     Equipment Recommendations  None recommended by PT    Recommendations for Other Services       Precautions / Restrictions Precautions Precautions: Fall Restrictions Weight Bearing Restrictions: No    Mobility  Bed Mobility Overal bed mobility: Needs Assistance Bed Mobility: Supine to Sit     Supine to sit: Supervision     General bed mobility comments: Heavy use of UE on bed rails for trunk elevation, no physical assist needed; attempted to give patient verbal cues for sequencing of transfer  Transfers Overall transfer level: Needs assistance Equipment used: Rolling walker (2 wheeled) Transfers: Sit to/from Stand Sit to  Stand: Min guard         General transfer comment: Patient tremulous throughout mobility, verbal cues for hand placement and safety.  Ambulation/Gait Ambulation/Gait assistance: Min guard Gait Distance (Feet): 115 Feet Assistive device: Rolling walker (2 wheeled)   Gait velocity: decreased   General Gait Details: Patient with unsteadiness/shakiness noted with ambulation. Step through gait pattern utilized, some drifting noted.   Stairs             Wheelchair Mobility    Modified Rankin (Stroke Patients Only)       Balance Overall balance assessment: Needs assistance Sitting-balance support: Feet supported Sitting balance-Leahy Scale: Fair     Standing balance support: Bilateral upper extremity supported Standing balance-Leahy Scale: Poor                              Cognition Arousal/Alertness: Awake/alert Behavior During Therapy: Flat affect Overall Cognitive Status: No family/caregiver present to determine baseline cognitive functioning                                 General Comments: pt is forgetful and cannot give details of his history and cannot determine if this is baseline      Exercises General Exercises - Lower Extremity Long Arc Quad: AROM;Both;15 reps;Strengthening;Seated Hip Flexion/Marching: AROM;Strengthening;Both;15 reps;Seated Toe Raises: Seated;Strengthening;AROM;Both;15 reps Heel Raises: Seated;Strengthening;AROM;Both;15 reps    General Comments        Pertinent Vitals/Pain Pain Assessment: No/denies pain    Home Living  Prior Function            PT Goals (current goals can now be found in the care plan section) Progress towards PT goals: Progressing toward goals    Frequency    Min 2X/week      PT Plan Discharge plan needs to be updated    Co-evaluation              AM-PAC PT "6 Clicks" Mobility   Outcome Measure  Help needed turning from your back  to your side while in a flat bed without using bedrails?: A Little Help needed moving from lying on your back to sitting on the side of a flat bed without using bedrails?: A Little Help needed moving to and from a bed to a chair (including a wheelchair)?: A Little Help needed standing up from a chair using your arms (e.g., wheelchair or bedside chair)?: A Little Help needed to walk in hospital room?: A Little Help needed climbing 3-5 steps with a railing? : A Lot 6 Click Score: 17    End of Session Equipment Utilized During Treatment: Gait belt Activity Tolerance: Patient tolerated treatment well Patient left: in chair;with chair alarm set;with call bell/phone within reach Nurse Communication: Mobility status PT Visit Diagnosis: Unsteadiness on feet (R26.81);Other abnormalities of gait and mobility (R26.89);Adult, failure to thrive (R62.7)     Time: 5329-9242 PT Time Calculation (min) (ACUTE ONLY): 19 min  Charges:  $Therapeutic Exercise: 8-22 mins $Therapeutic Activity: 8-22 mins                     Olga Coaster PT, DPT 1:51 PM,01/26/19 [phone]

## 2019-01-27 LAB — CBC
HCT: 43.5 % (ref 39.0–52.0)
Hemoglobin: 14.8 g/dL (ref 13.0–17.0)
MCH: 29.2 pg (ref 26.0–34.0)
MCHC: 34 g/dL (ref 30.0–36.0)
MCV: 85.8 fL (ref 80.0–100.0)
PLATELETS: 300 10*3/uL (ref 150–400)
RBC: 5.07 MIL/uL (ref 4.22–5.81)
RDW: 13.4 % (ref 11.5–15.5)
WBC: 8 10*3/uL (ref 4.0–10.5)
nRBC: 0 % (ref 0.0–0.2)

## 2019-01-27 NOTE — Progress Notes (Signed)
[facility] - [place] at [facility]   PATIENT NAME: Maxwell Miller    MR#:  [id number]  DATE OF BIRTH:  Oct 25, 1961  SUBJECTIVE:   Denies any complaints.issues with memory REVIEW OF SYSTEMS:   Review of Systems  Constitutional: Negative for chills, fever and weight loss.  HENT: Negative for ear discharge, ear pain and nosebleeds.   Eyes: Negative for blurred vision, pain and discharge.  Respiratory: Negative for sputum production, shortness of breath, wheezing and stridor.   Cardiovascular: Negative for chest pain, palpitations, orthopnea and PND.  Gastrointestinal: Negative for abdominal pain, diarrhea, nausea and vomiting.  Genitourinary: Negative for frequency and urgency.  Musculoskeletal: Negative for back pain and joint pain.  Neurological: Positive for weakness. Negative for sensory change, speech change and focal weakness.  Psychiatric/Behavioral: Positive for memory loss. Negative for depression and hallucinations. The patient is not nervous/anxious.    Tolerating Diet:yes Tolerating PT: yes  DRUG ALLERGIES:  No Known Allergies  VITALS:  Blood pressure 122/84, pulse 79, temperature 98.4 F (36.9 C), temperature source Oral, resp. rate 14, height 5\' 11"  (1.803 m), weight 81.6 kg, SpO2 100 %.  PHYSICAL EXAMINATION:   Physical Exam  GENERAL:  58 y.o.-year-old patient lying in the bed with no acute distress.  EYES: Pupils equal, round, reactive to light and accommodation. No scleral icterus. Extraocular muscles intact.  HEENT: Head atraumatic, normocephalic. Oropharynx and nasopharynx clear.  NECK:  Supple, no jugular venous distention. No thyroid enlargement, no tenderness.  LUNGS: Normal breath sounds bilaterally, no wheezing, rales, rhonchi. No use of accessory muscles of respiration.  CARDIOVASCULAR: S1, S2 normal. No murmurs, rubs, or gallops.  ABDOMEN: Soft, nontender, nondistended. Bowel sounds present. No organomegaly or mass.   EXTREMITIES: No cyanosis, clubbing or edema b/l.    NEUROLOGIC: Cranial nerves II through XII are intact. No focal Motor or sensory deficits b/l.   PSYCHIATRIC:  patient is alert and oriented x 3.  SKIN: No obvious rash, lesion, or ulcer.   LABORATORY PANEL:  CBC Recent Labs  Lab 01/27/19 0350  WBC 8.0  HGB 14.8  HCT 43.5  PLT 300    Chemistries  Recent Labs  Lab 01/23/19 0245  NA 138  K 3.4*  CL 106  CO2 24  GLUCOSE 99  BUN 11  CREATININE 0.75  CALCIUM 8.3*  AST 45*  ALT 36  ALKPHOS 61  BILITOT 0.9   Cardiac Enzymes No results for input(s): TROPONINI in the last 168 hours. RADIOLOGY:  No results found. ASSESSMENT AND PLAN:  58 year old male with history of intracranial hemorrhage x2, short-term memory loss and erythrocytosis who presents with generalized weakness, confusion and fever.  1. Sepsis:likely due toCT suggestive of possible proctitis Due toE. coli in the blood Continue Ipo bactrim DS for total 10 days per Dr Rivka Safer Appreciate ID input Patient also has an abnormal LP cultures negative so far Afebrile, wbc normal  2. History of hypertension: -resumed BP meds -pt has issues with non compliance  3. History of intracranial hemorrhage/CVAMRI of the brain ordered mental status appears to be at baseline  4.Portal vein thrombosis - cotn eliquis  5. Generalized weakness. Physical therapy eval noted. Pt has no issurance.  Will try to get home PT-charity if possible   Left message for patient's wife [name] She called the  CM--and has REFUSED to take the patient back APS report filed by CSW   CODE STATUS: full  DVT Prophylaxis: *eliquis*  TOTAL TIME TAKING CARE OF THIS PATIENT: *  30* minutes.  >50% time spent on counselling and coordination of care  POSSIBLE D/C IN *?* DAYS, DEPENDING ON CLINICAL CONDITION.  Note: This dictation was prepared with Dragon dictation along with smaller phrase technology. Any transcriptional  errors that result from this process are unintentional.  Enedina Finner M.D on 01/27/2019 at 1:13 PM  Between 7am to 6pm - Pager - [phone]  After 6pm go to [email or web address] - password [email or web address]  [facility]  Office  [phone]  CC: Primary care physician; Mable Paris, PA-CPatient ID: Maxwell Miller, male   DOB: 1961/08/22, 58 y.o.   MRN: [id number]

## 2019-01-27 NOTE — Plan of Care (Signed)
  Problem: Skin Integrity: Goal: Risk for impaired skin integrity will decrease Outcome: Progressing   Problem: Activity: Goal: Risk for activity intolerance will decrease Outcome: Progressing   Problem: Safety: Goal: Ability to remain free from injury will improve Outcome: Progressing   Problem: Skin Integrity: Goal: Risk for impaired skin integrity will decrease Outcome: Progressing

## 2019-01-28 MED ORDER — SULFAMETHOXAZOLE-TRIMETHOPRIM 800-160 MG PO TABS
1.0000 | ORAL_TABLET | Freq: Two times a day (BID) | ORAL | Status: AC
  Administered 2019-01-28 – 2019-02-02 (×11): 1 via ORAL
  Filled 2019-01-28 (×11): qty 1

## 2019-01-28 NOTE — Progress Notes (Signed)
[facility] [place] at [facility]   PATIENT NAME: Maxwell Miller    MR#:  [id number]  DATE OF BIRTH:  1961/01/29  SUBJECTIVE:   Denies any complaints. Has chronic issues with memory  Out in the chair. According to the nurse patient ambulated in the hallways today. REVIEW OF SYSTEMS:   Review of Systems  Constitutional: Negative for chills, fever and weight loss.  HENT: Negative for ear discharge, ear pain and nosebleeds.   Eyes: Negative for blurred vision, pain and discharge.  Respiratory: Negative for sputum production, shortness of breath, wheezing and stridor.   Cardiovascular: Negative for chest pain, palpitations, orthopnea and PND.  Gastrointestinal: Negative for abdominal pain, diarrhea, nausea and vomiting.  Genitourinary: Negative for frequency and urgency.  Musculoskeletal: Negative for back pain and joint pain.  Neurological: Positive for weakness. Negative for sensory change, speech change and focal weakness.  Psychiatric/Behavioral: Positive for memory loss. Negative for depression and hallucinations. The patient is not nervous/anxious.    Tolerating Diet:yes Tolerating PT: yes  DRUG ALLERGIES:  No Known Allergies  VITALS:  Blood pressure 102/68, pulse 82, temperature 98.2 F (36.8 C), temperature source Oral, resp. rate 16, height 5\' 11"  (1.803 m), weight 81.6 kg, SpO2 98 %.  PHYSICAL EXAMINATION:   Physical Exam  GENERAL:  58 y.o.-year-old patient lying in the bed with no acute distress.  EYES: Pupils equal, round, reactive to light and accommodation. No scleral icterus. Extraocular muscles intact.  HEENT: Head atraumatic, normocephalic. Oropharynx and nasopharynx clear.  NECK:  Supple, no jugular venous distention. No thyroid enlargement, no tenderness.  LUNGS: Normal breath sounds bilaterally, no wheezing, rales, rhonchi. No use of accessory muscles of respiration.  CARDIOVASCULAR: S1, S2 normal. No murmurs, rubs, or gallops.   ABDOMEN: Soft, nontender, nondistended. Bowel sounds present. No organomegaly or mass.  EXTREMITIES: No cyanosis, clubbing or edema b/l.    NEUROLOGIC: Cranial nerves II through XII are intact. No focal Motor or sensory deficits b/l.   PSYCHIATRIC:  patient is alert and awake.  SKIN: No obvious rash, lesion, or ulcer.   LABORATORY PANEL:  CBC Recent Labs  Lab 01/27/19 0350  WBC 8.0  HGB 14.8  HCT 43.5  PLT 300    Chemistries  Recent Labs  Lab 01/23/19 0245  NA 138  K 3.4*  CL 106  CO2 24  GLUCOSE 99  BUN 11  CREATININE 0.75  CALCIUM 8.3*  AST 45*  ALT 36  ALKPHOS 61  BILITOT 0.9   Cardiac Enzymes No results for input(s): TROPONINI in the last 168 hours. RADIOLOGY:  No results found. ASSESSMENT AND PLAN:  59 year old male with history of intracranial hemorrhage x2, short-term memory loss and erythrocytosis who presents with generalized weakness, confusion and fever.  1. Sepsis:likely due toCT suggestive of possible proctitis Due toE. coli in the blood Continue po bactrim DS for total 10 days per Dr Rivka Safer (started on 01/24/2019) Appreciate ID input Patient also has an abnormal LP cultures negative so far Afebrile, wbc normal  2. History of hypertension -resumed BP meds -pt has issues with non compliance  3. History of intracranial hemorrhage/CVAMRI of the brain ordered mental status appears to be at baseline  4.chronic Portal vein thrombosis - cotn eliquis  5. Generalized weakness. Physical therapy eval noted. Pt has no issurance.     Left message for patient's wife [name] She called the  CM--and has REFUSED to take the patient back APS report filed by CSW   CODE STATUS:  full  DVT Prophylaxis: *eliquis*  TOTAL TIME TAKING CARE OF THIS PATIENT: *30* minutes.  >50% time spent on counselling and coordination of care  POSSIBLE D/C IN *?* DAYS, DEPENDING ON CLINICAL CONDITION.  Note: This dictation was prepared with Dragon  dictation along with smaller phrase technology. Any transcriptional errors that result from this process are unintentional.  Enedina Finner M.D on 01/28/2019 at 10:29 AM  Between 7am to 6pm - Pager - [phone]  After 6pm go to [email or web address] - password [email or web address]  [facility]  Office  [phone]  CC: Primary care physician; Mable Paris, PA-CPatient ID: Maxwell Miller, male   DOB: 1961/02/01, 58 y.o.   MRN: [id number]

## 2019-01-28 NOTE — Progress Notes (Signed)
Physical Therapy Treatment Patient Details Name: Maxwell Miller MRN: [id number] DOB: 1961-11-04 Today's Date: 01/28/2019    History of Present Illness 58 yo male with onset of AMS wtih confusion and fever, sepsis was admitted an dnoted encephalopathy, has recent B thalamic hemorrhages.  PMHx:  portal vein thrombosis, CVA, HTN, erythrocytosis    PT Comments    Patient agreeable to PT with some encouragement. Patient requesting to utilize the commode, denies any pain. Patient oriented to person, disoriented to place, time, situation. Patient performed bed mobility with supervision and one step commands. Ambulated ~126ft with RW and CGA. Overall patient more tremulous than previous session, needed intermittent verbal cues for safety no LOB noted. Patient requesting to eat breakfast, further mobility held. Pt up in chair at end of session with all needs in reach.      Follow Up Recommendations  Home health PT;Supervision for mobility/OOB     Equipment Recommendations  None recommended by PT    Recommendations for Other Services       Precautions / Restrictions Precautions Precautions: Fall Restrictions Weight Bearing Restrictions: No    Mobility  Bed Mobility Overal bed mobility: Needs Assistance Bed Mobility: Supine to Sit     Supine to sit: Supervision     General bed mobility comments: Verbal cues in step by step instructions  Transfers Overall transfer level: Needs assistance Equipment used: Rolling walker (2 wheeled) Transfers: Sit to/from Stand Sit to Stand: Min guard         General transfer comment: Patient with increased trembling compared to last session throughout mobility, verbal cues for hand placement and safety.  Ambulation/Gait Ambulation/Gait assistance: Min guard Gait Distance (Feet): 150 Feet Assistive device: Rolling walker (2 wheeled)   Gait velocity: decreased   General Gait Details: Patient with trembling/shakiness noted throughout  ambulation, overall steady. Able to increase distance without complaint.    Stairs             Wheelchair Mobility    Modified Rankin (Stroke Patients Only)       Balance Overall balance assessment: Needs assistance Sitting-balance support: Feet supported Sitting balance-Leahy Scale: Fair       Standing balance-Leahy Scale: Fair Standing balance comment: Patient able to stand without UE support statically, needs UE support with dynamic standing or ambulation                            Cognition Arousal/Alertness: Awake/alert Behavior During Therapy: Flat affect Overall Cognitive Status: No family/caregiver present to determine baseline cognitive functioning                                 General Comments: Pt disoriented to time, place. oriented to person. Behavior WFLs for therapy.      Exercises Other Exercises Other Exercises: Pt also ambulated into bathroom, but then told PT he no longer had to use the commode.     General Comments        Pertinent Vitals/Pain Pain Assessment: No/denies pain    Home Living                      Prior Function            PT Goals (current goals can now be found in the care plan section) Progress towards PT goals: Progressing toward goals    Frequency    Min  2X/week      PT Plan Current plan remains appropriate    Co-evaluation              AM-PAC PT "6 Clicks" Mobility   Outcome Measure  Help needed turning from your back to your side while in a flat bed without using bedrails?: A Little Help needed moving from lying on your back to sitting on the side of a flat bed without using bedrails?: A Little Help needed moving to and from a bed to a chair (including a wheelchair)?: A Little Help needed standing up from a chair using your arms (e.g., wheelchair or bedside chair)?: A Little Help needed to walk in hospital room?: A Little Help needed climbing 3-5 steps with a  railing? : A Lot 6 Click Score: 17    End of Session Equipment Utilized During Treatment: Gait belt Activity Tolerance: Patient tolerated treatment well Patient left: in chair;with chair alarm set;with call bell/phone within reach Nurse Communication: Mobility status PT Visit Diagnosis: Unsteadiness on feet (R26.81);Other abnormalities of gait and mobility (R26.89);Adult, failure to thrive (R62.7)     Time: 0912-0932 PT Time Calculation (min) (ACUTE ONLY): 20 min  Charges:  $Therapeutic Exercise: 8-22 mins                    Olga Coaster PT, DPT 9:45 AM,01/28/19 [phone]

## 2019-01-29 MED ORDER — DOCUSATE SODIUM 100 MG PO CAPS
100.0000 mg | ORAL_CAPSULE | Freq: Two times a day (BID) | ORAL | Status: DC
  Administered 2019-01-29 – 2019-03-03 (×64): 100 mg via ORAL
  Filled 2019-01-29 (×66): qty 1

## 2019-01-29 MED ORDER — SENNOSIDES-DOCUSATE SODIUM 8.6-50 MG PO TABS
1.0000 | ORAL_TABLET | Freq: Every day | ORAL | Status: DC
  Administered 2019-01-29 – 2019-02-21 (×24): 1 via ORAL
  Filled 2019-01-29 (×26): qty 1

## 2019-01-29 NOTE — Plan of Care (Signed)
Pt at baseline Problem: Skin Integrity: Goal: Risk for impaired skin integrity will decrease Outcome: Progressing   Problem: Activity: Goal: Risk for activity intolerance will decrease Outcome: Progressing   Problem: Safety: Goal: Ability to remain free from injury will improve Outcome: Progressing   Problem: Skin Integrity: Goal: Risk for impaired skin integrity will decrease Outcome: Progressing

## 2019-01-29 NOTE — Progress Notes (Signed)
[facility] - [place] at [facility]   PATIENT NAME: Maxwell Miller    MR#:  [id number]  DATE OF BIRTH:  January 21, 1961  SUBJECTIVE: Patient denies any complaints.  Does have short-term memory, patient tells me that he is from [place], he has a sister and wife, he does not know why he ended up in [place] says that everything is obscure   REVIEW OF SYSTEMS:   Review of Systems  Constitutional: Negative for chills, fever and weight loss.  HENT: Negative for ear discharge, ear pain and nosebleeds.   Eyes: Negative for blurred vision, pain and discharge.  Respiratory: Negative for sputum production, shortness of breath, wheezing and stridor.   Cardiovascular: Negative for chest pain, palpitations, orthopnea and PND.  Gastrointestinal: Negative for abdominal pain, diarrhea, nausea and vomiting.  Genitourinary: Negative for frequency and urgency.  Musculoskeletal: Negative for back pain and joint pain.  Neurological: Positive for weakness. Negative for sensory change, speech change and focal weakness.  Psychiatric/Behavioral: Positive for memory loss. Negative for depression and hallucinations. The patient is not nervous/anxious.    Tolerating Diet:yes Tolerating PT: yes  DRUG ALLERGIES:  No Known Allergies  VITALS:  Blood pressure 110/77, pulse 77, temperature 98.4 F (36.9 C), temperature source Oral, resp. rate 16, height 5\' 11"  (1.803 m), weight 86 kg, SpO2 100 %.  PHYSICAL EXAMINATION:   Physical Exam  GENERAL:  58 y.o.-year-old patient lying in the bed with no acute distress.  EYES: Pupils equal, round, reactive to light and accommodation. No scleral icterus. Extraocular muscles intact.  HEENT: Head atraumatic, normocephalic. Oropharynx and nasopharynx clear.  NECK:  Supple, no jugular venous distention. No thyroid enlargement, no tenderness.  LUNGS: Normal breath sounds bilaterally, no wheezing, rales, rhonchi. No use of accessory muscles of  respiration.  CARDIOVASCULAR: S1, S2 normal. No murmurs, rubs, or gallops.  ABDOMEN: Soft, nontender, nondistended. Bowel sounds present. No organomegaly or mass.  EXTREMITIES: No cyanosis, clubbing or edema b/l.    NEUROLOGIC: Cranial nerves II through XII are intact. No focal Motor or sensory deficits b/l.   PSYCHIATRIC:  patient is alert and awake.  SKIN: No obvious rash, lesion, or ulcer.   LABORATORY PANEL:  CBC Recent Labs  Lab 01/27/19 0350  WBC 8.0  HGB 14.8  HCT 43.5  PLT 300    Chemistries  Recent Labs  Lab 01/23/19 0245  NA 138  K 3.4*  CL 106  CO2 24  GLUCOSE 99  BUN 11  CREATININE 0.75  CALCIUM 8.3*  AST 45*  ALT 36  ALKPHOS 61  BILITOT 0.9   Cardiac Enzymes No results for input(s): TROPONINI in the last 168 hours. RADIOLOGY:  No results found. ASSESSMENT AND PLAN:  58 year old male with history of intracranial hemorrhage x2, short-term memory loss and erythrocytosis who presents with generalized weakness, confusion and fever.  1. Sepsis:likely due toCT suggestive of possible proctitis Due toE. coli in the blood Continue po bactrim DS for total 10 days per Dr Rivka Safer (started on 01/24/2019) Appreciate ID input Patient also has an abnormal LP cultures negative so far Afebrile, wbc normal  2. History of hypertension -resumed BP meds -pt has issues with non compliance  3. History of intracranial hemorrhage/CVAMRI of the brain ordered mental status appears to be at baseline  4.chronic Portal vein thrombosis - cotn eliquis  5. Generalized weakness. Physical therapy eval noted. Pt has no issurance.     Left message for patient's wife Maxwell Miller She called the  CM--and has REFUSED to take the patient back APS report filed by CSW   CODE STATUS: full  DVT Prophylaxis: *eliquis*  TOTAL TIME TAKING CARE OF THIS PATIENT: *30* minutes.  >50% time spent on counselling and coordination of care  POSSIBLE D/C IN *?* DAYS,  DEPENDING ON CLINICAL CONDITION.  Note: This dictation was prepared with Dragon dictation along with smaller phrase technology. Any transcriptional errors that result from this process are unintentional.  Katha Hamming M.D on 01/29/2019 at 10:29 AM  Between 7am to 6pm - Pager - [phone]  After 6pm go to [email or web address] - password [email or web address]  [facility]  Office  [phone]  CC: Primary care physician; Maxwell Paris, PA-CPatient ID: Maxwell Miller, male   DOB: Jan 29, 1961, 58 y.o.   MRN: [id number]

## 2019-01-29 NOTE — Clinical Social Work Note (Signed)
CSW left voicemail for APS supervisor [place] to determine plan for patient. CSW also attempted to contact patient's wife Melodie Bouillon [phone] but she is not answering and voicemail is currently full. CSW will continue to follow for discharge planning.   Ruthe Mannan MSW, [address] [phone]

## 2019-01-30 DIAGNOSIS — E440 Moderate protein-calorie malnutrition: Secondary | ICD-10-CM

## 2019-01-30 MED ORDER — ADULT MULTIVITAMIN W/MINERALS CH
1.0000 | ORAL_TABLET | Freq: Every day | ORAL | Status: DC
  Administered 2019-01-30 – 2019-04-01 (×60): 1 via ORAL
  Filled 2019-01-30 (×62): qty 1

## 2019-01-30 MED ORDER — VITAMIN B-1 100 MG PO TABS
100.0000 mg | ORAL_TABLET | Freq: Every day | ORAL | Status: DC
  Administered 2019-01-30 – 2019-04-01 (×58): 100 mg via ORAL
  Filled 2019-01-30 (×60): qty 1

## 2019-01-30 MED ORDER — FOLIC ACID 1 MG PO TABS
1.0000 mg | ORAL_TABLET | Freq: Every day | ORAL | Status: DC
  Administered 2019-01-30 – 2019-04-01 (×60): 1 mg via ORAL
  Filled 2019-01-30 (×62): qty 1

## 2019-01-30 MED ORDER — POLYETHYLENE GLYCOL 3350 17 G PO PACK
17.0000 g | PACK | Freq: Every day | ORAL | Status: DC
  Administered 2019-01-31 – 2019-02-22 (×21): 17 g via ORAL
  Filled 2019-01-30 (×22): qty 1

## 2019-01-30 MED ORDER — ENSURE ENLIVE PO LIQD
237.0000 mL | Freq: Two times a day (BID) | ORAL | Status: DC
  Administered 2019-01-30 – 2019-04-01 (×111): 237 mL via ORAL

## 2019-01-30 NOTE — Progress Notes (Signed)
Physical Therapy Treatment Patient Details Name: Maxwell Miller MRN: [id number] DOB: 1961-02-26 Today's Date: 01/30/2019    History of Present Illness 58 yo male with onset of AMS wtih confusion and fever, sepsis was admitted an dnoted encephalopathy, has recent B thalamic hemorrhages.  PMHx:  portal vein thrombosis, CVA, HTN, erythrocytosis    PT Comments    Patient agreeable to PT, seemed in better spirits today, stated he remembered PT. Denied any pain. Patient mobilized to EOB with supervision, and performed sit <> stand with CGA and RW. Patient more unsteady this session with transfers and ambulation. Ambulated ~111ft with RW and CGA. Complained of lightheadedness. HR and spO2 assessed with standing rest break, spO2>92%, initial HR in 160s. Returned patient to chair, HR in 140s, and decreased with rest into 110-115 BPM. RN notified of status, pt in NAD and reported resolvement of symptoms in sitting. The patient would benefit from further skilled PT to continue to address limitations and maximize safety. Recommend closely monitoring HR response next session.     Follow Up Recommendations  Home health PT;Supervision for mobility/OOB     Equipment Recommendations  None recommended by PT    Recommendations for Other Services       Precautions / Restrictions Precautions Precautions: Fall Restrictions Weight Bearing Restrictions: No    Mobility  Bed Mobility Overal bed mobility: Needs Assistance Bed Mobility: Supine to Sit     Supine to sit: Supervision        Transfers Overall transfer level: Needs assistance Equipment used: Rolling walker (2 wheeled) Transfers: Sit to/from Stand Sit to Stand: Min guard         General transfer comment: Patient tremuluous throughout transfer. Cued to slow movements to increase safety.  Ambulation/Gait Ambulation/Gait assistance: Min guard Gait Distance (Feet): 150 Feet Assistive device: Rolling walker (2 wheeled)        General Gait Details: Patient more unsteady this session. Complained of lightheadedness. HR and spO2 assessed with standing rest break, spO2>92%, initial HR in 160s.   Stairs             Wheelchair Mobility    Modified Rankin (Stroke Patients Only)       Balance Overall balance assessment: Needs assistance Sitting-balance support: Feet supported Sitting balance-Leahy Scale: Fair     Standing balance support: Bilateral upper extremity supported                                Cognition Arousal/Alertness: Awake/alert Behavior During Therapy: Flat affect Overall Cognitive Status: No family/caregiver present to determine baseline cognitive functioning                                 General Comments: Pt in better spirits this PM, remembered PT      Exercises      General Comments        Pertinent Vitals/Pain      Home Living                      Prior Function            PT Goals (current goals can now be found in the care plan section) Progress towards PT goals: Progressing toward goals    Frequency    Min 2X/week      PT Plan Current plan remains appropriate    Co-evaluation  AM-PAC PT "6 Clicks" Mobility   Outcome Measure  Help needed turning from your back to your side while in a flat bed without using bedrails?: A Little Help needed moving from lying on your back to sitting on the side of a flat bed without using bedrails?: A Little Help needed moving to and from a bed to a chair (including a wheelchair)?: A Little Help needed standing up from a chair using your arms (e.g., wheelchair or bedside chair)?: A Little Help needed to walk in hospital room?: A Little Help needed climbing 3-5 steps with a railing? : A Lot 6 Click Score: 17    End of Session Equipment Utilized During Treatment: Gait belt Activity Tolerance: Patient tolerated treatment well Patient left: in chair;with chair  alarm set;with call bell/phone within reach Nurse Communication: Mobility status;Other (comment)(HR with ambulation) PT Visit Diagnosis: Unsteadiness on feet (R26.81);Other abnormalities of gait and mobility (R26.89);Adult, failure to thrive (R62.7)     Time: 3570-1779 PT Time Calculation (min) (ACUTE ONLY): 17 min  Charges:  $Therapeutic Activity: 8-22 mins                    Olga Coaster PT, DPT 4:35 PM,01/30/19 [phone]

## 2019-01-30 NOTE — Plan of Care (Signed)
  Problem: Skin Integrity: Goal: Risk for impaired skin integrity will decrease Outcome: Progressing   Problem: Activity: Goal: Risk for activity intolerance will decrease Outcome: Progressing   Problem: Safety: Goal: Ability to remain free from injury will improve Outcome: Progressing   Problem: Skin Integrity: Goal: Risk for impaired skin integrity will decrease Outcome: Progressing   

## 2019-01-30 NOTE — Progress Notes (Signed)
[facility] -  at [facility]   PATIENT NAME: Maxwell Miller    MR#:  [id number]  DATE OF BIRTH:  [birth year]  Denies any complaints, continues to have poor short-term memory.  Social worker is unable to contact his ex-wife.   REVIEW OF SYSTEMS:   Review of Systems  Constitutional: Negative for chills, fever and weight loss.  HENT: Negative for ear discharge, ear pain and nosebleeds.   Eyes: Negative for blurred vision, pain and discharge.  Respiratory: Negative for sputum production, shortness of breath, wheezing and stridor.   Cardiovascular: Negative for chest pain, palpitations, orthopnea and PND.  Gastrointestinal: Negative for abdominal pain, diarrhea, nausea and vomiting.  Genitourinary: Negative for frequency and urgency.  Musculoskeletal: Negative for back pain and joint pain.  Neurological: Positive for weakness. Negative for sensory change, speech change and focal weakness.  Psychiatric/Behavioral: Positive for memory loss. Negative for depression and hallucinations. The patient is not nervous/anxious.    Tolerating Diet:yes Tolerating PT: yes  DRUG ALLERGIES:  No Known Allergies  VITALS:  Blood pressure 118/81, pulse 80, temperature 98.4 F (36.9 C), temperature source Oral, resp. rate 18, height 5\' 11"  (1.803 m), weight 86 kg, SpO2 97 %.  PHYSICAL EXAMINATION:   Physical Exam  GENERAL:  58 y.o.-year-old patient lying in the bed with no acute distress.  EYES: Pupils equal, round, reactive to light and accommodation. No scleral icterus. Extraocular muscles intact.  HEENT: Head atraumatic, normocephalic. Oropharynx and nasopharynx clear.  NECK:  Supple, no jugular venous distention. No thyroid enlargement, no tenderness.  LUNGS: Normal breath sounds bilaterally, no wheezing, rales, rhonchi. No use of accessory muscles of respiration.  CARDIOVASCULAR: S1, S2 normal. No murmurs, rubs, or gallops.  ABDOMEN: Soft, nontender,  nondistended. Bowel sounds present. No organomegaly or mass.  EXTREMITIES: No cyanosis, clubbing or edema b/l.    NEUROLOGIC: Cranial nerves II through XII are intact. No focal Motor or sensory deficits b/l.   PSYCHIATRIC:  patient is alert and awake.  SKIN: No obvious rash, lesion, or ulcer.   LABORATORY PANEL:  CBC Recent Labs  Lab 01/27/19 0350  WBC 8.0  HGB 14.8  HCT 43.5  PLT 300    Chemistries  No results for input(s): NA, K, CL, CO2, GLUCOSE, BUN, CREATININE, CALCIUM, MG, AST, ALT, ALKPHOS, BILITOT in the last 168 hours.  Invalid input(s): GFRCGP Cardiac Enzymes No results for input(s): TROPONINI in the last 168 hours. RADIOLOGY:  No results found. ASSESSMENT AND PLAN:  58 year old male with history of intracranial hemorrhage x2, short-term memory loss and erythrocytosis who presents with generalized weakness, confusion and fever.  1. Sepsis:likely due toCT suggestive of possible proctitis Due toE. coli in the blood Continue po bactrim DS for total 10 days per Dr Rivka Safer (started on 01/24/2019) Appreciate ID input Patient also has an abnormal LP cultures negative so far Afebrile, wbc normal  2. History of hypertension -resumed BP meds -pt has issues with non compliance as evidenced by MRI of the brain.  3. History of intracranial hemorrhage/CVAMRI of the brain ordered mental status appears to be at baseline  4.chronic Portal vein thrombosis - cotn eliquis  5. Generalized weakness. Physical therapy eval noted. Pt has no issurance.  Do recommend SNF.  There is no safe discharge plan for the patient at this time.   Left message for patient's wife [name] She called the  CM--and has REFUSED to take the patient back APS report filed by CSW #[id number] .nonsevere malnutrition in the context  of chronic illness, continue Ensure, multivitamins, regular diet, will also give trial ofthiamine, folic acid. Short-term memory loss, of the brain showed chronic  bilateral thalamic hemorrhages consistent with prior hypertensive insults.  No active infection, bleed, infarct. CODE STATUS: full  DVT Prophylaxis: *eliquis*  TOTAL TIME TAKING CARE OF THIS PATIENT: *30* minutes.  >50% time spent on counselling and coordination of care  POSSIBLE D/C IN *?* DAYS, DEPENDING ON CLINICAL CONDITION.  Note: This dictation was prepared with Dragon dictation along with smaller phrase technology. Any transcriptional errors that result from this process are unintentional.  Katha Hamming M.D on 01/30/2019 at 1:21 PM  Between 7am to 6pm - Pager - [phone]  After 6pm go to [email or web address] - password [email or web address]  [facility]  Office  [phone]  CC: Primary care physician; Mable Paris, PA-CPatient ID: Maxwell Miller, male   DOB: [birth year], 58 y.o.   MRN: [id number]

## 2019-01-30 NOTE — Progress Notes (Signed)
Initial Nutrition Assessment  DOCUMENTATION CODES:   Non-severe (moderate) malnutrition in context of social or environmental circumstances  INTERVENTION:   - Ensure Enlive po BID, each supplement provides 350 kcal and 20 grams of protein  - MVI with minerals daily  - Agree with Regular diet order  - Recommend continued bowel regimen as last document BM was on 01/20/19  NUTRITION DIAGNOSIS:   Moderate Malnutrition related to social / environmental circumstances as evidenced by mild fat depletion, moderate fat depletion, mild muscle depletion, moderate muscle depletion.  GOAL:   Patient will meet greater than or equal to 90% of their needs  MONITOR:   PO intake, Supplement acceptance, Weight trends, I & O's  REASON FOR ASSESSMENT:   LOS    ASSESSMENT:   58 year old male who presented to the ED on 3/10 with AMS. PMH of stroke, HTN, memory loss. Pt admitted with sepsis.  CT of the abdomen on 3/12 suggestive of possible proctitis.  APS report filed for pt on 3/16 due to neglect/abandonment.  Spoke with pt at bedside. Pt reports having a "pretty good" appetite. Noted 75% completed breakfast meal tray at bedside. Pt had consumed 100% of potatoes, 50% of scrambled eggs, 100% of cereal and milk. Pt endorses getting enough food to eat at meal times.  Pt reports that PTA, he consistently consumed 3 meals daily. Breakfast would include "something like this" (referring to breakfast meal tray). Pt unable to provide more details. Per pt, lunch was "something light." Dinner may include chicken, rice, and vegetables. Pt endorses occasionally snacking between meals on fruit or a Snickers bar.  Pt states that he thinks he has lost weight and reports his UBW as 200 lbs. Pt is unsure when he last weighed his UBW. Weight history in chart is limited but demonstrates that pt has maintained his weight since October 2019.  When asked why pt lost weight, he reports it's because he was getting  less to eat. Pt states this is not related to not getting enough at the hospital but instead to not getting enough PTA. Pt shares that what he was being provided at meal times was less than usual in terms of portion sizes.  Pt is amenable to trying oral nutrition supplements. RD explained importance of adequate PO intake and protein intake in maintaining lean muscle mass. Pt states, "I don't have any muscles." RD will also order daily MVI.  Pt denies any issues chewing or swallowing and states that he experiences nausea occasionally. Pt states that his bowel movements are regular and denies constipation. However, noted last documented BD was on 01/20/19.  Meal Completion: 0-60% x 8 meals (average 36%)  Medications reviewed and include: Colace, Senna  Labs reviewed.  NUTRITION - FOCUSED PHYSICAL EXAM:    Most Recent Value  Orbital Region  Moderate depletion  Upper Arm Region  Moderate depletion  Thoracic and Lumbar Region  Mild depletion  Buccal Region  Mild depletion  Temple Region  No depletion  Clavicle Bone Region  No depletion  Clavicle and Acromion Bone Region  Mild depletion  Scapular Bone Region  No depletion  Dorsal Hand  Moderate depletion  Patellar Region  No depletion  Anterior Thigh Region  Mild depletion  Posterior Calf Region  Mild depletion  Edema (RD Assessment)  None  Hair  Reviewed  Eyes  Reviewed  Mouth  Reviewed  Skin  Reviewed  Nails  Reviewed       Diet Order:   Diet Order  Diet - low sodium heart healthy        Diet regular Room service appropriate? Yes; Fluid consistency: Thin  Diet effective now              EDUCATION NEEDS:   Education needs have been addressed  Skin:  Skin Assessment: Reviewed RN Assessment  Last BM:  01/23/19  Height:   Ht Readings from Last 1 Encounters:  01/20/19 5\' 11"  (1.803 m)    Weight:   Wt Readings from Last 1 Encounters:  01/29/19 86 kg    Ideal Body Weight:  78.2 kg  BMI:  Body mass  index is 26.46 kg/m.  Estimated Nutritional Needs:   Kcal:  2150-2350  Protein:  110-125 grams  Fluid:  >/= 2.2 L    Earma Reading, MS, RD, LDN Inpatient Clinical Dietitian Pager: [phone] Weekend/After Hours: [phone]

## 2019-01-31 LAB — CBC
HCT: 49.5 % (ref 39.0–52.0)
Hemoglobin: 16.5 g/dL (ref 13.0–17.0)
MCH: 29 pg (ref 26.0–34.0)
MCHC: 33.3 g/dL (ref 30.0–36.0)
MCV: 87 fL (ref 80.0–100.0)
NRBC: 0 % (ref 0.0–0.2)
Platelets: 458 10*3/uL — ABNORMAL HIGH (ref 150–400)
RBC: 5.69 MIL/uL (ref 4.22–5.81)
RDW: 13.8 % (ref 11.5–15.5)
WBC: 8.8 10*3/uL (ref 4.0–10.5)

## 2019-01-31 MED ORDER — BISACODYL 10 MG RE SUPP
10.0000 mg | Freq: Every day | RECTAL | Status: DC | PRN
  Administered 2019-02-01: 10 mg via RECTAL
  Filled 2019-01-31: qty 1

## 2019-01-31 NOTE — Progress Notes (Signed)
[facility] [place] at [facility]   PATIENT NAME: Maxwell Miller    MR#:  [id number]  DATE OF BIRTH:  [birth year]  SUBJECTIVE:  CHIEF COMPLAINT:   Chief Complaint  Patient presents with  . Altered Mental Status   -Pleasantly confused.  Significant short-term memory loss. -Denies any complaints.  REVIEW OF SYSTEMS:  Review of Systems  Constitutional: Negative for chills and fever.  HENT: Negative for congestion, hearing loss and nosebleeds.   Eyes: Negative for blurred vision and double vision.  Respiratory: Negative for cough, shortness of breath and wheezing.   Cardiovascular: Negative for chest pain and palpitations.  Gastrointestinal: Positive for constipation. Negative for abdominal pain, diarrhea, nausea and vomiting.  Genitourinary: Negative for dysuria.  Musculoskeletal: Negative for myalgias.  Neurological: Negative for dizziness, focal weakness, seizures, weakness and headaches.  Psychiatric/Behavioral: Negative for depression.    DRUG ALLERGIES:  No Known Allergies  VITALS:  Blood pressure 111/81, pulse 80, temperature 98.5 F (36.9 C), temperature source Oral, resp. rate 16, height 5\' 11"  (1.803 m), weight 86 kg, SpO2 100 %.  PHYSICAL EXAMINATION:  Physical Exam   GENERAL:  58 y.o.-year-old patient lying in the bed with no acute distress.  EYES: Pupils equal, round, reactive to light and accommodation. No scleral icterus. Extraocular muscles intact.  HEENT: Head atraumatic, normocephalic. Oropharynx and nasopharynx clear.  NECK:  Supple, no jugular venous distention. No thyroid enlargement, no tenderness.  LUNGS: Normal breath sounds bilaterally, no wheezing, rales,rhonchi or crepitation. No use of accessory muscles of respiration.  CARDIOVASCULAR: S1, S2 normal. No murmurs, rubs, or gallops.  ABDOMEN: Soft, nontender, nondistended. Bowel sounds present. No organomegaly or mass.  EXTREMITIES: No pedal edema, cyanosis, or clubbing.   NEUROLOGIC: Cranial nerves II through XII are intact. Muscle strength 5/5 in all extremities. Sensation intact. Gait not checked.  PSYCHIATRIC: The patient is alert and oriented to self.  SKIN: No obvious rash, lesion, or ulcer.    LABORATORY PANEL:   CBC Recent Labs  Lab 01/31/19 0524  WBC 8.8  HGB 16.5  HCT 49.5  PLT 458*   ------------------------------------------------------------------------------------------------------------------  Chemistries  No results for input(s): NA, K, CL, CO2, GLUCOSE, BUN, CREATININE, CALCIUM, MG, AST, ALT, ALKPHOS, BILITOT in the last 168 hours.  Invalid input(s): GFRCGP ------------------------------------------------------------------------------------------------------------------  Cardiac Enzymes No results for input(s): TROPONINI in the last 168 hours. ------------------------------------------------------------------------------------------------------------------  RADIOLOGY:  No results found.  EKG:   Orders placed or performed during the hospital encounter of 01/20/19  . EKG 12-Lead  . EKG 12-Lead  . ED EKG 12-Lead  . ED EKG 12-Lead    ASSESSMENT AND PLAN:   58 year old male with history of intracranial hemorrhage x2, short-term memory loss and erythrocytosis who presents with generalized weakness, confusion and fever.  1. Sepsis:likely due toCT suggestive of possible proctitis -[name] in the blood -Continue p.o. Bactrim.  Total duration 10 days per ID Appreciate ID input Patient also has an abnormal LP cultures negative so far Afebrile, wbc normal  2.  Confusion-short-term memory loss.  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  No acute findings or no features of meningitis seen. -Seems to be at baseline.  Needs placement  3.Portal vein thrombosis - cotn eliquis-monitor closely especially given history of  intraventricular hemorrhage in the past  4.  Generalized weakness-improving.  Will need a placement  5.  Hypertension-on Norvasc, lisinopril  APS report is pending.  Appreciate Child psychotherapist  consult     All the records are reviewed and case discussed with Care Management/Social Workerr. Management plans discussed with the patient, family and they are in agreement.  CODE STATUS: Full Code  TOTAL TIME TAKING CARE OF THIS PATIENT: 38 minutes.   POSSIBLE D/C IN 2 DAYS, DEPENDING ON CLINICAL CONDITION.   Enid Baas M.D on 01/31/2019 at 12:12 PM  Between 7am to 6pm - Pager - [phone]  After 6pm go to [email or web address] - Social research officer, government  [facility]  Office  [phone]  CC: Primary care physician; Mable Paris, PA-C

## 2019-02-01 LAB — BASIC METABOLIC PANEL
Anion gap: 9 (ref 5–15)
BUN: 19 mg/dL (ref 6–20)
CHLORIDE: 102 mmol/L (ref 98–111)
CO2: 23 mmol/L (ref 22–32)
CREATININE: 1.23 mg/dL (ref 0.61–1.24)
Calcium: 9 mg/dL (ref 8.9–10.3)
GFR calc Af Amer: >60 mL/min (ref >60)
GFR calc non Af Amer: >60 mL/min (ref >60)
Glucose, Bld: 105 mg/dL — ABNORMAL HIGH (ref 70–99)
Potassium: 4.3 mmol/L (ref 3.5–5.1)
Sodium: 134 mmol/L — ABNORMAL LOW (ref 135–145)

## 2019-02-01 NOTE — Plan of Care (Signed)
  Problem: Skin Integrity: Goal: Risk for impaired skin integrity will decrease Outcome: Progressing   Problem: Activity: Goal: Risk for activity intolerance will decrease Outcome: Progressing   Problem: Safety: Goal: Ability to remain free from injury will improve Outcome: Progressing   Problem: Skin Integrity: Goal: Risk for impaired skin integrity will decrease Outcome: Progressing   

## 2019-02-01 NOTE — Progress Notes (Signed)
[facility] - [place] at [facility]   PATIENT NAME: Maxwell Miller    MR#:  [id number]  DATE OF BIRTH:  [birth year]  SUBJECTIVE:  CHIEF COMPLAINT:   Chief Complaint  Patient presents with  . Altered Mental Status   -Pleasantly confused.  Significant short-term memory loss. -Denies any complaints.  REVIEW OF SYSTEMS:  Review of Systems  Constitutional: Negative for chills and fever.  HENT: Negative for congestion, hearing loss and nosebleeds.   Eyes: Negative for blurred vision and double vision.  Respiratory: Negative for cough, shortness of breath and wheezing.   Cardiovascular: Negative for chest pain and palpitations.  Gastrointestinal: Negative for abdominal pain, constipation, diarrhea, nausea and vomiting.  Genitourinary: Negative for dysuria.  Musculoskeletal: Negative for myalgias.  Neurological: Negative for dizziness, focal weakness, seizures, weakness and headaches.  Psychiatric/Behavioral: Negative for depression.    DRUG ALLERGIES:  No Known Allergies  VITALS:  Blood pressure 119/89, pulse 85, temperature 98.5 F (36.9 C), resp. rate 16, height 5\' 11"  (1.803 m), weight 86 kg, SpO2 95 %.  PHYSICAL EXAMINATION:  Physical Exam   GENERAL:  58 y.o.-year-old patient lying in the bed with no acute distress.  EYES: Pupils equal, round, reactive to light and accommodation. No scleral icterus. Extraocular muscles intact.  HEENT: Head atraumatic, normocephalic. Oropharynx and nasopharynx clear.  NECK:  Supple, no jugular venous distention. No thyroid enlargement, no tenderness.  LUNGS: Normal breath sounds bilaterally, no wheezing, rales,rhonchi or crepitation. No use of accessory muscles of respiration.  CARDIOVASCULAR: S1, S2 normal. No murmurs, rubs, or gallops.  ABDOMEN: Soft, nontender, nondistended. Bowel sounds present. No organomegaly or mass.  EXTREMITIES: No pedal edema, cyanosis, or clubbing.  NEUROLOGIC: Cranial nerves II through XII  are intact. Muscle strength 5/5 in all extremities. Sensation intact. Gait not checked.  PSYCHIATRIC: The patient is alert and oriented to self.  SKIN: No obvious rash, lesion, or ulcer.    LABORATORY PANEL:   CBC Recent Labs  Lab 01/31/19 0524  WBC 8.8  HGB 16.5  HCT 49.5  PLT 458*   ------------------------------------------------------------------------------------------------------------------  Chemistries  Recent Labs  Lab 02/01/19 0411  NA 134*  K 4.3  CL 102  CO2 23  GLUCOSE 105*  BUN 19  CREATININE 1.23  CALCIUM 9.0   ------------------------------------------------------------------------------------------------------------------  Cardiac Enzymes No results for input(s): TROPONINI in the last 168 hours. ------------------------------------------------------------------------------------------------------------------  RADIOLOGY:  No results found.  EKG:   Orders placed or performed during the hospital encounter of 01/20/19  . EKG 12-Lead  . EKG 12-Lead  . ED EKG 12-Lead  . ED EKG 12-Lead    ASSESSMENT AND PLAN:   58 year old male with history of intracranial hemorrhage x2, short-term memory loss and erythrocytosis who presents with generalized weakness, confusion and fever.  1. Sepsis:likely due toCT suggestive of possible proctitis -[name] in the blood -Continue p.o. Bactrim.  Total duration 10 days per ID Appreciate ID input Patient also has an abnormal LP cultures negative so far Afebrile, wbc normal  2.  Confusion-short-term memory loss.  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  No acute findings or no features of meningitis seen. -Seems to be at baseline.  Needs placement  3.Portal vein thrombosis - cotn eliquis-monitor closely especially given history of intraventricular hemorrhage in the past  4.  Generalized weakness-improving.   Will need placement  5.  Hypertension-on Norvasc, lisinopril  APS report is pending.  Appreciate Child psychotherapist  consult     All the records are reviewed and case discussed with Care Management/Social Workerr. Management plans discussed with the patient, family and they are in agreement.  CODE STATUS: Full Code  TOTAL TIME TAKING CARE OF THIS PATIENT: 26 minutes.   POSSIBLE D/C IN 2 DAYS, DEPENDING ON CLINICAL CONDITION.   Enid Baas M.D on 02/01/2019 at 8:41 AM  Between 7am to 6pm - Pager - [phone]  After 6pm go to [email or web address] - Social research officer, government  [facility]  Office  [phone]  CC: Primary care physician; Mable Paris, PA-C

## 2019-02-02 NOTE — TOC Progression Note (Signed)
Transition of Care [facility]) - Progression Note    Patient Details  Name: Maxwell Miller MRN: [id number] Date of Birth: [birth year]  Transition of Care [facility]) CM/SW Contact  Ruthe Mannan, [place] Phone Number: 02/02/2019, 10:21 AM  Clinical Narrative:   CSW received a phone call from [facility] with [facility] DSS APS [phone]. Herbert Seta states that they are following the patient and may pursue guardianship. CSW explained that patient is assisted living level of care and has no family that we are aware of other than ex wife. Herbert Seta states that she will be following and will talk with CSW as things progress. CSW will continue to follow for discharge planning.     Expected Discharge Plan: Home w Home Health Services Barriers to Discharge: Family Issues, Transportation  Expected Discharge Plan and Services Expected Discharge Plan: Home w Home Health Services   Discharge Planning Services: CM Consult Post Acute Care Choice: Home Health, Durable Medical Equipment   Expected Discharge Date: 01/24/19                   HH Arranged: RN, PT, Nurse's Aide HH Agency: [facility]   Social Determinants of Health (SDOH) Interventions    Readmission Risk Interventions No flowsheet data found.

## 2019-02-02 NOTE — Progress Notes (Signed)
[facility] - [place] at [facility]   PATIENT NAME: Maxwell Miller    MR#:  [id number]  DATE OF BIRTH:  [birth year]  SUBJECTIVE:  CHIEF COMPLAINT:   Chief Complaint  Patient presents with  . Altered Mental Status   -Pleasantly confused.  Significant short-term memory loss. -Denies any complaints.  REVIEW OF SYSTEMS:  Review of Systems  Constitutional: Negative for chills and fever.  HENT: Negative for congestion, hearing loss and nosebleeds.   Eyes: Negative for blurred vision and double vision.  Respiratory: Negative for cough, shortness of breath and wheezing.   Cardiovascular: Negative for chest pain and palpitations.  Gastrointestinal: Negative for abdominal pain, constipation, diarrhea, nausea and vomiting.  Genitourinary: Negative for dysuria.  Musculoskeletal: Negative for myalgias.  Neurological: Negative for dizziness, focal weakness, seizures, weakness and headaches.  Psychiatric/Behavioral: Negative for depression.    DRUG ALLERGIES:  No Known Allergies  VITALS:  Blood pressure 128/84, pulse 84, temperature 98 F (36.7 C), temperature source Oral, resp. rate 14, height 5\' 11"  (1.803 m), weight 86 kg, SpO2 99 %.  PHYSICAL EXAMINATION:  Physical Exam   GENERAL:  58 y.o.-year-old patient lying in the bed with no acute distress.  EYES: Pupils equal, round, reactive to light and accommodation. No scleral icterus. Extraocular muscles intact.  HEENT: Head atraumatic, normocephalic. Oropharynx and nasopharynx clear.  NECK:  Supple, no jugular venous distention. No thyroid enlargement, no tenderness.  LUNGS: Normal breath sounds bilaterally, no wheezing, rales,rhonchi or crepitation. No use of accessory muscles of respiration.  CARDIOVASCULAR: S1, S2 normal. No murmurs, rubs, or gallops.  ABDOMEN: Soft, nontender, nondistended. Bowel sounds present. No organomegaly or mass.  EXTREMITIES: No pedal edema, cyanosis, or clubbing.  NEUROLOGIC: Cranial  nerves II through XII are intact. Muscle strength 5/5 in all extremities. Sensation intact. Gait not checked.  PSYCHIATRIC: The patient is alert and oriented to self.  SKIN: No obvious rash, lesion, or ulcer.    LABORATORY PANEL:   CBC Recent Labs  Lab 01/31/19 0524  WBC 8.8  HGB 16.5  HCT 49.5  PLT 458*   ------------------------------------------------------------------------------------------------------------------  Chemistries  Recent Labs  Lab 02/01/19 0411  NA 134*  K 4.3  CL 102  CO2 23  GLUCOSE 105*  BUN 19  CREATININE 1.23  CALCIUM 9.0   ------------------------------------------------------------------------------------------------------------------  Cardiac Enzymes No results for input(s): TROPONINI in the last 168 hours. ------------------------------------------------------------------------------------------------------------------  RADIOLOGY:  No results found.  EKG:   Orders placed or performed during the hospital encounter of 01/20/19  . EKG 12-Lead  . EKG 12-Lead  . ED EKG 12-Lead  . ED EKG 12-Lead    ASSESSMENT AND PLAN:   58 year old male with history of intracranial hemorrhage x2, short-term memory loss and erythrocytosis who presents with generalized weakness, confusion and fever.  1. Sepsis: likely due toCT suggestive of possible proctitis -[name] in the blood -Continue p.o. Bactrim- will finish his 10 days today, per ID Appreciate ID input Patient also has an abnormal LP cultures negative so far Afebrile, wbc normal  2.  Confusion-short-term memory loss.  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  No acute findings or no features of meningitis seen. -Seems to be at baseline.  Needs placement  3.Portal vein thrombosis - cotn eliquis-monitor closely especially given history of intraventricular hemorrhage in the past  4.   Generalized weakness-improving.  Will need placement  5.  Hypertension-on Norvasc, lisinopril  APS notified- working  on getting a guardian for the patient.  Appreciate social worker consult     All the records are reviewed and case discussed with Care Management/Social Workerr. Management plans discussed with the patient, family and they are in agreement.  CODE STATUS: Full Code  TOTAL TIME TAKING CARE OF THIS PATIENT: 28 minutes.   POSSIBLE D/C IN ? DAYS, DEPENDING ON CLINICAL CONDITION.   Enid Baas M.D on 02/02/2019 at 12:36 PM  Between 7am to 6pm - Pager - [phone]  After 6pm go to [email or web address] - Social research officer, government  [facility]  Office  [phone]  CC: Primary care physician; Mable Paris, PA-C

## 2019-02-02 NOTE — Progress Notes (Signed)
   02/02/19 1300  Clinical Encounter Type  Visited With Patient  Visit Type Follow-up  Pt was alert and talkative. Pt talked about how in life you need to always be mindful of your own actions and be responsible for it. Pt wants to apply his reflection to his life when he gets discharged and really take charge of his life. Pt enjoyed talking to the chaplain.

## 2019-02-02 NOTE — Plan of Care (Signed)
  Problem: Skin Integrity: Goal: Risk for impaired skin integrity will decrease Outcome: Progressing   Problem: Activity: Goal: Risk for activity intolerance will decrease Outcome: Progressing   Problem: Safety: Goal: Ability to remain free from injury will improve Outcome: Progressing   Problem: Skin Integrity: Goal: Risk for impaired skin integrity will decrease Outcome: Progressing   Problem: Skin Integrity: Goal: Risk for impaired skin integrity will decrease Outcome: Progressing

## 2019-02-02 NOTE — Progress Notes (Signed)
Physical Therapy Treatment Patient Details Name: Maxwell Miller MRN: [id number] DOB: 1961-05-13 Today's Date: 02/02/2019    History of Present Illness 58 yo male with onset of AMS wtih confusion and fever, sepsis was admitted an dnoted encephalopathy, has recent B thalamic hemorrhages.  PMHx:  portal vein thrombosis, CVA, HTN, erythrocytosis    PT Comments    Pt agreeable to PT with encouragement. Reported to PT after previous session he has "fallen mentally", and focused on having adequate nutrition to participate in therapy session, multiple questions throughout session and educated by PT each time. HR 110-120's at rest in bed, mobilized to EOB with supervision. HR 130s-140s sitting EOB. Patient with more tremors and more effortful with mobility this session. HR in 130s, sit <>stand attempted, elevated HR to 140s. Returned to bed with all needs in reach RN notified of pt mobility status and elevated HR. The patient would benefit from further skilled PT to maximize mobility, independence, and safety.     Follow Up Recommendations  Home health PT;Supervision for mobility/OOB     Equipment Recommendations  None recommended by PT    Recommendations for Other Services       Precautions / Restrictions Precautions Precautions: Fall Restrictions Weight Bearing Restrictions: No    Mobility  Bed Mobility Overal bed mobility: Needs Assistance Bed Mobility: Supine to Sit     Supine to sit: Supervision        Transfers Overall transfer level: Needs assistance Equipment used: Rolling walker (2 wheeled) Transfers: Sit to/from Stand Sit to Stand: Min guard         General transfer comment: Pt more  effortful with movement/transfers this session. Increased tremors. Pt also with complaints of dizziness. HR 130-140s RN notified.  Ambulation/Gait             General Gait Details: deferred due to elevate HR   Stairs             Wheelchair Mobility    Modified  Rankin (Stroke Patients Only)       Balance Overall balance assessment: Needs assistance Sitting-balance support: Feet supported Sitting balance-Leahy Scale: Fair     Standing balance support: Bilateral upper extremity supported Standing balance-Leahy Scale: Fair                              Cognition Arousal/Alertness: Awake/alert Behavior During Therapy: Anxious;Agitated Overall Cognitive Status: No family/caregiver present to determine baseline cognitive functioning                                 General Comments: Pt more restless/agitated this session. More tangential conversation noted      Exercises      General Comments        Pertinent Vitals/Pain Pain Assessment: No/denies pain    Home Living                      Prior Function            PT Goals (current goals can now be found in the care plan section) Progress towards PT goals: Not progressing toward goals - comment(HR limited session)    Frequency    Min 2X/week      PT Plan Current plan remains appropriate    Co-evaluation              AM-PAC PT "6  Clicks" Mobility   Outcome Measure  Help needed turning from your back to your side while in a flat bed without using bedrails?: A Little Help needed moving from lying on your back to sitting on the side of a flat bed without using bedrails?: A Little Help needed moving to and from a bed to a chair (including a wheelchair)?: A Little Help needed standing up from a chair using your arms (e.g., wheelchair or bedside chair)?: A Little Help needed to walk in hospital room?: A Little Help needed climbing 3-5 steps with a railing? : A Lot 6 Click Score: 17    End of Session Equipment Utilized During Treatment: Gait belt Activity Tolerance: Patient limited by fatigue;Treatment limited secondary to medical complications (Comment);Other (comment)(elevated HR) Patient left: in bed;with bed alarm set;with call  bell/phone within reach Nurse Communication: Mobility status PT Visit Diagnosis: Unsteadiness on feet (R26.81);Other abnormalities of gait and mobility (R26.89);Adult, failure to thrive (R62.7)     Time: 7169-6789 PT Time Calculation (min) (ACUTE ONLY): 19 min  Charges:  $Therapeutic Activity: 8-22 mins                     Olga Coaster PT, DPT 2:00 PM,02/02/19 [phone]

## 2019-02-03 MED ORDER — METOPROLOL TARTRATE 25 MG PO TABS
25.0000 mg | ORAL_TABLET | Freq: Two times a day (BID) | ORAL | Status: DC
  Administered 2019-02-03 – 2019-04-01 (×111): 25 mg via ORAL
  Filled 2019-02-03 (×115): qty 1

## 2019-02-03 MED ORDER — LISINOPRIL 10 MG PO TABS
10.0000 mg | ORAL_TABLET | Freq: Every day | ORAL | Status: DC
  Administered 2019-02-03 – 2019-02-12 (×10): 10 mg via ORAL
  Filled 2019-02-03 (×11): qty 1

## 2019-02-03 NOTE — Progress Notes (Signed)
[facility] [place] [facility]   PATIENT NAME: Maxwell Miller    MR#:  [id number]  DATE OF BIRTH:  02-16-1961  SUBJECTIVE:  CHIEF COMPLAINT:   Chief Complaint  Patient presents with  . Altered Mental Status   -Pleasantly confused.  Significant  memory loss. -Denies any complaints.  REVIEW OF SYSTEMS:  Review of Systems  Constitutional: Negative for chills and fever.  HENT: Negative for congestion, hearing loss and nosebleeds.   Eyes: Negative for blurred vision and double vision.  Respiratory: Negative for cough, shortness of breath and wheezing.   Cardiovascular: Negative for chest pain and palpitations.  Gastrointestinal: Negative for abdominal pain, constipation, diarrhea, nausea and vomiting.  Genitourinary: Negative for dysuria.  Musculoskeletal: Negative for myalgias.  Neurological: Negative for dizziness, focal weakness, seizures, weakness and headaches.  Psychiatric/Behavioral: Negative for depression.    DRUG ALLERGIES:  No Known Allergies  VITALS:  Blood pressure 128/74, pulse 80, temperature 98.3 F (36.8 C), temperature source Oral, resp. rate 18, height 5\' 11"  (1.803 m), weight 86 kg, SpO2 97 %.  PHYSICAL EXAMINATION:  Physical Exam   GENERAL:  58 y.o.-year-old patient lying in the bed with no acute distress.  EYES: Pupils equal, round, reactive to light and accommodation. No scleral icterus. Extraocular muscles intact.  HEENT: Head atraumatic, normocephalic. Oropharynx and nasopharynx clear.  NECK:  Supple, no jugular venous distention. No thyroid enlargement, no tenderness.  LUNGS: Normal breath sounds bilaterally, no wheezing, rales,rhonchi or crepitation. No use of accessory muscles of respiration.  CARDIOVASCULAR: S1, S2 normal. No murmurs, rubs, or gallops.  ABDOMEN: Soft, nontender, nondistended. Bowel sounds present. No organomegaly or mass.  EXTREMITIES: No pedal edema, cyanosis, or clubbing.  NEUROLOGIC: Cranial nerves  II through XII are intact. Muscle strength 5/5 in all extremities. Sensation intact. Gait not checked.  PSYCHIATRIC: The patient is alert and oriented to self.  SKIN: No obvious rash, lesion, or ulcer.    LABORATORY PANEL:   CBC Recent Labs  Lab 01/31/19 0524  WBC 8.8  HGB 16.5  HCT 49.5  PLT 458*   ------------------------------------------------------------------------------------------------------------------  Chemistries  Recent Labs  Lab 02/01/19 0411  NA 134*  K 4.3  CL 102  CO2 23  GLUCOSE 105*  BUN 19  CREATININE 1.23  CALCIUM 9.0   ------------------------------------------------------------------------------------------------------------------  Cardiac Enzymes No results for input(s): TROPONINI in the last 168 hours. ------------------------------------------------------------------------------------------------------------------  RADIOLOGY:  No results found.  EKG:   Orders placed or performed during the hospital encounter of 01/20/19  . EKG 12-Lead  . EKG 12-Lead  . ED EKG 12-Lead  . ED EKG 12-Lead    ASSESSMENT AND PLAN:   58 year old male with history of intracranial hemorrhage x2, short-term memory loss and erythrocytosis who presents with generalized weakness, confusion and fever.  1. Sepsis: likely due toCT suggestive of possible proctitis -[name] in the blood on 01/20/19 -finished trt with PO bactrim, per ID Appreciate ID input Patient also has an abnormal LP cultures, but negative so far Afebrile, wbc normal  2.  Confusion-short-term memory loss.  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  No acute findings or no features of meningitis seen. -Seems to be at baseline.  Needs placement  3.Portal vein thrombosis - on eliquis-monitor closely especially given history of intraventricular hemorrhage in the past  4.   Generalized weakness-improving.  Physical therapy recommended home health  5.  Hypertension-on Norvasc, lisinopril  Currently no family.  Will need a  guardian APS notified- working on getting a guardian for the patient and then placement.  Appreciate social worker consult     All the records are reviewed and case discussed with Care Management/Social Workerr. Management plans discussed with the patient, family and they are in agreement.  CODE STATUS: Full Code  TOTAL TIME TAKING CARE OF THIS PATIENT: 22 minutes.   POSSIBLE D/C IN ? DAYS, DEPENDING ON CLINICAL CONDITION.   Enid Baas M.D on 02/03/2019 at 11:32 AM  Between 7am to 6pm - Pager - [phone]  After 6pm go to [email or web address] - Social research officer, government  [facility]  Office  [phone]  CC: Primary care physician; Mable Paris, PA-C

## 2019-02-03 NOTE — Clinical Social Work Note (Signed)
CSW received phone call from Levander Campion at [facility] in [facility] regarding patient. CSW explained that patient needs Assisted living placement but has no income that we are aware of and family is not cooperating with CSW for placement. Inetta Fermo states that she will look into finances for patient and call CSW back to try and find options for him. CSW will continue to follow for discharge planning.   Ruthe Mannan MSW, [address] [phone]

## 2019-02-03 NOTE — Progress Notes (Signed)
Physical Therapy Treatment Patient Details Name: Maxwell Miller MRN: [id number] DOB: [birth year] Today's Date: 02/03/2019    History of Present Illness 58 yo male with onset of AMS wtih confusion and fever, sepsis was admitted an dnoted encephalopathy, has recent B thalamic hemorrhages.  PMHx:  portal vein thrombosis, CVA, HTN, erythrocytosis    PT Comments    Pt continues to have some anxiety and t/o session continually reports "emptiness" in the back of his head that he is unable to elaborate on. His O2 and BP were good during session, however he had elevated HR most of the time, up to 160s during ambulation but was relatively asymptomatic (nursing following with chair in the event he needed to sit).  Pt did relatively well with PT aspects, but medically still having some issues.    Follow Up Recommendations  Home health PT;Supervision for mobility/OOB     Equipment Recommendations  None recommended by PT    Recommendations for Other Services       Precautions / Restrictions Precautions Precautions: Fall Restrictions Weight Bearing Restrictions: No    Mobility  Bed Mobility Overal bed mobility: Independent Bed Mobility: Supine to Sit     Supine to sit: Supervision Sit to supine: Supervision   General bed mobility comments: No issues getting in/out of bed  Transfers Overall transfer level: Modified independent Equipment used: Rolling walker (2 wheeled) Transfers: Sit to/from Stand Sit to Stand: Min guard         General transfer comment: Pt did not follow instructions well for set up/hand placement, but was able to rise w/o assist with great effort  Ambulation/Gait Ambulation/Gait assistance: Supervision Gait Distance (Feet): 150 Feet Assistive device: Rolling walker (2 wheeled)       General Gait Details: Pt again had elevated HR with ambulation, 110s to 160s relatively quickly, nursing with [place] t/o the effort with chair to sit if needed, pt's O2 remained  mid/high 90s t/o the effort   Stairs             Wheelchair Mobility    Modified Rankin (Stroke Patients Only)       Balance Overall balance assessment: Modified Independent Sitting-balance support: Feet supported Sitting balance-Leahy Scale: Good     Standing balance support: Bilateral upper extremity supported Standing balance-Leahy Scale: Fair Standing balance comment: Patient able to stand without UE support statically, needs UE support with dynamic standing or ambulation                            Cognition Arousal/Alertness: Awake/alert Behavior During Therapy: Anxious Overall Cognitive Status: Within Functional Limits for tasks assessed                                        Exercises      General Comments General comments (skin integrity, edema, etc.): Pt's BP and O2 normal t/o session, HR 140s in getting to sitting, down to 110s before ambulation then up to 160s during ambulation      Pertinent Vitals/Pain Pain Assessment: No/denies pain    Home Living                      Prior Function            PT Goals (current goals can now be found in the care plan section) Progress towards PT  goals: Progressing toward goals    Frequency    Min 2X/week      PT Plan Current plan remains appropriate    Co-evaluation              AM-PAC PT "6 Clicks" Mobility   Outcome Measure  Help needed turning from your back to your side while in a flat bed without using bedrails?: None Help needed moving from lying on your back to sitting on the side of a flat bed without using bedrails?: None Help needed moving to and from a bed to a chair (including a wheelchair)?: None Help needed standing up from a chair using your arms (e.g., wheelchair or bedside chair)?: A Little Help needed to walk in hospital room?: None Help needed climbing 3-5 steps with a railing? : A Little 6 Click Score: 22    End of Session  Equipment Utilized During Treatment: Gait belt Activity Tolerance: Patient limited by fatigue Patient left: with bed alarm set;with call bell/phone within reach Nurse Communication: Mobility status PT Visit Diagnosis: Unsteadiness on feet (R26.81);Other abnormalities of gait and mobility (R26.89);Adult, failure to thrive (R62.7)     Time: 1130-1145 PT Time Calculation (min) (ACUTE ONLY): 15 min  Charges:  $Gait Training: 8-22 mins                     Malachi Pro, DPT 02/03/2019, 2:25 PM

## 2019-02-04 NOTE — Progress Notes (Signed)
[facility] - [place] at [facility]   PATIENT NAME: Maxwell Miller    MR#:  [id number]  DATE OF BIRTH:  10-15-1961  SUBJECTIVE:  CHIEF COMPLAINT:   Chief Complaint  Patient presents with  . Altered Mental Status   -Pleasantly confused.  Significant  memory loss. -Denies any complaints.  REVIEW OF SYSTEMS:  Review of Systems  Constitutional: Negative for chills and fever.  HENT: Negative for congestion, hearing loss and nosebleeds.   Eyes: Negative for blurred vision and double vision.  Respiratory: Negative for cough, shortness of breath and wheezing.   Cardiovascular: Negative for chest pain and palpitations.  Gastrointestinal: Negative for abdominal pain, constipation, diarrhea, nausea and vomiting.  Genitourinary: Negative for dysuria.  Musculoskeletal: Negative for myalgias.  Neurological: Negative for dizziness, focal weakness, seizures, weakness and headaches.  Psychiatric/Behavioral: Negative for depression.    DRUG ALLERGIES:  No Known Allergies  VITALS:  Blood pressure 117/88, pulse 91, temperature (!) 97.5 F (36.4 C), temperature source Axillary, resp. rate 18, height 5\' 11"  (1.803 m), weight 86 kg, SpO2 98 %.  PHYSICAL EXAMINATION:  Physical Exam   GENERAL:  58 y.o.-year-old patient lying in the bed with no acute distress.  EYES: Pupils equal, round, reactive to light and accommodation. No scleral icterus. Extraocular muscles intact.  HEENT: Head atraumatic, normocephalic. Oropharynx and nasopharynx clear.  NECK:  Supple, no jugular venous distention. No thyroid enlargement, no tenderness.  LUNGS: Normal breath sounds bilaterally, no wheezing, rales,rhonchi or crepitation. No use of accessory muscles of respiration.  CARDIOVASCULAR: S1, S2 normal. No murmurs, rubs, or gallops.  ABDOMEN: Soft, nontender, nondistended. Bowel sounds present. No organomegaly or mass.  EXTREMITIES: No pedal edema, cyanosis, or clubbing.  NEUROLOGIC: Cranial  nerves II through XII are intact. Muscle strength 5/5 in all extremities. Sensation intact. Gait not checked.  PSYCHIATRIC: The patient is alert and oriented to self.  SKIN: No obvious rash, lesion, or ulcer.    LABORATORY PANEL:   CBC Recent Labs  Lab 01/31/19 0524  WBC 8.8  HGB 16.5  HCT 49.5  PLT 458*   ------------------------------------------------------------------------------------------------------------------  Chemistries  Recent Labs  Lab 02/01/19 0411  NA 134*  K 4.3  CL 102  CO2 23  GLUCOSE 105*  BUN 19  CREATININE 1.23  CALCIUM 9.0   ------------------------------------------------------------------------------------------------------------------  Cardiac Enzymes No results for input(s): TROPONINI in the last 168 hours. ------------------------------------------------------------------------------------------------------------------  RADIOLOGY:  No results found.  EKG:   Orders placed or performed during the hospital encounter of 01/20/19  . EKG 12-Lead  . EKG 12-Lead  . ED EKG 12-Lead  . ED EKG 12-Lead    ASSESSMENT AND PLAN:   58 year old male with history of intracranial hemorrhage x2, short-term memory loss and erythrocytosis who presents with generalized weakness, confusion and fever.  1. Sepsis: likely due toCT suggestive of possible proctitis -[name] in the blood on 01/20/19 -finished trt with PO bactrim, per ID Appreciate ID input Patient also has an abnormal LP cultures, but negative so far Afebrile, wbc normal  2.  Confusion-short-term memory loss.  Had rapidly progressive dementia.  ?  Underlying vascular dementia as well. 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  No acute findings or no features of meningitis seen. -Seems to be at baseline.  Needs placement  3.Portal vein thrombosis - on eliquis-monitor closely especially given history of  intraventricular hemorrhage in the past  4.  Generalized weakness-improving.  Physical therapy  recommended home health  5.  Hypertension-on Norvasc, lisinopril  Currently no family.  Will need a  guardian APS notified- working on getting a guardian for the patient and then placement.  Appreciate social worker consult Patient is able to ambulate with physical therapy.     All the records are reviewed and case discussed with Care Management/Social Workerr. Management plans discussed with the patient, family and they are in agreement.  CODE STATUS: Full Code  TOTAL TIME TAKING CARE OF THIS PATIENT: 22 minutes.   POSSIBLE D/C IN ? DAYS, DEPENDING ON CLINICAL CONDITION.   Enid Baas M.D on 02/04/2019 at 10:05 AM  Between 7am to 6pm - Pager - [phone]  After 6pm go to [email or web address] - Social research officer, government  [facility] [place] [facility]  Office  [phone]  CC: Primary care physician; Mable Paris, PA-C

## 2019-02-04 NOTE — Progress Notes (Signed)
Physical Therapy Treatment Patient Details Name: Maxwell Miller MRN: [id number] DOB: 02-14-1961 Today's Date: 02/04/2019    History of Present Illness 58 yo male with onset of AMS wtih confusion and fever, sepsis was admitted an dnoted encephalopathy, has recent B thalamic hemorrhages.  PMHx:  portal vein thrombosis, CVA, HTN, erythrocytosis    PT Comments    Patient is more alert this session and is able to demonstrate better mobility and balance. Patient able to ambulate over 521ft with guarding and cuing to increase foot clearance/prevent fall risk. Patient is able to demonstrate good following of safety cuing and decreased "sensation of falling" when focused on something in front of him. Patient demonstrating increased static and dynamic balance with cuing and guarding from PT for safety. Would benefit from skilled PT to address above deficits and promote optimal return to PLOF   Follow Up Recommendations  Home health PT;Supervision for mobility/OOB     Equipment Recommendations  None recommended by PT    Recommendations for Other Services       Precautions / Restrictions Precautions Precautions: Fall Restrictions Weight Bearing Restrictions: No    Mobility  Bed Mobility Overal bed mobility: Independent Bed Mobility: Supine to Sit     Supine to sit: Modified independent (Device/Increase time) Sit to supine: Modified independent (Device/Increase time)   General bed mobility comments: No issues getting in/out of bed  Transfers Overall transfer level: Modified independent Equipment used: Rolling walker (2 wheeled) Transfers: Sit to/from Stand Sit to Stand: Modified independent (Device/Increase time)         General transfer comment: Followed cuing for safety this session with good carry over  Ambulation/Gait Ambulation/Gait assistance: Supervision Gait Distance (Feet): 500 Feet Assistive device: Rolling walker (2 wheeled) Gait Pattern/deviations: Step-through  pattern;Drifts right/left;Shuffle Gait velocity: decreased   General Gait Details: Patient able to ambulate 537ft with good RW management following extensive cuing for safety; Patient reports he feels like he is falling when he is walking, reports this is better when he looks ahead of him   Optometrist    Modified Rankin (Stroke Patients Only)       Balance Overall balance assessment: Modified Independent Sitting-balance support: Feet supported Sitting balance-Leahy Scale: Good     Standing balance support: No upper extremity supported Standing balance-Leahy Scale: Good Standing balance comment: Patient able to stand without UE support statically, completing reaching outside BOS without LOB. [place] with eyes open and close 30sec and tadem 10sec; unable to complete SLS     Tandem Stance - Right Leg: 10 Tandem Stance - Left Leg: 10 [place] - Eyes Opened: 30 [place] - Eyes Closed: 30                Cognition Arousal/Alertness: Awake/alert Behavior During Therapy: Anxious Overall Cognitive Status: Within Functional Limits for tasks assessed                                 General Comments: Patient more alert/focused this session      Exercises Other Exercises Other Exercises: PT led patient through transfer supine>sit>stand and patientis able to comply with cuing for safety. Ambulated 532ft with min gaurd and RW with good carry over of safety with AD negotiation. Patient reports feeling like he is falling while he is walking, which he is able to cease after PT cuing to keep eyes in  front of him. PT attempted to have patient sit up in chair, and had a conversation with patient on benefits of getting out of bed; pt continuously reporting it "is not good for him"    General Comments        Pertinent Vitals/Pain Pain Assessment: No/denies pain    Home Living Family/patient expects to be discharged to:: Unsure                     Prior Function            PT Goals (current goals can now be found in the care plan section)      Frequency    Min 2X/week      PT Plan      Co-evaluation              AM-PAC PT "6 Clicks" Mobility   Outcome Measure  Help needed turning from your back to your side while in a flat bed without using bedrails?: None Help needed moving from lying on your back to sitting on the side of a flat bed without using bedrails?: None Help needed moving to and from a bed to a chair (including a wheelchair)?: None Help needed standing up from a chair using your arms (e.g., wheelchair or bedside chair)?: A Little Help needed to walk in hospital room?: None Help needed climbing 3-5 steps with a railing? : A Little 6 Click Score: 22    End of Session Equipment Utilized During Treatment: Gait belt Activity Tolerance: Patient limited by fatigue Patient left: with bed alarm set;with call bell/phone within reach Nurse Communication: Mobility status PT Visit Diagnosis: Unsteadiness on feet (R26.81);Other abnormalities of gait and mobility (R26.89);Adult, failure to thrive (R62.7)     Time: 1122-1200 PT Time Calculation (min) (ACUTE ONLY): 38 min  Charges:  $Therapeutic Activity: 23-37 mins                     Staci Acosta PT, DPT   Staci Acosta 02/04/2019, 1:20 PM

## 2019-02-05 MED ORDER — MAGNESIUM CITRATE PO SOLN
0.5000 | Freq: Once | ORAL | Status: AC
  Administered 2019-02-05: 16:00:00 0.5 via ORAL
  Filled 2019-02-05: qty 296

## 2019-02-05 NOTE — Progress Notes (Signed)
Physical Therapy Treatment Patient Details Name: Maxwell Miller MRN: [id number] DOB: 04/25/1961 Today's Date: 02/05/2019    History of Present Illness 57 yo male with onset of AMS wtih confusion and fever, sepsis was admitted an dnoted encephalopathy, has recent B thalamic hemorrhages.  PMHx:  portal vein thrombosis, CVA, HTN, erythrocytosis    PT Comments    Patient is able to transfer and ambulate safety with supervision, and has met all goals to DC PT in house. Patient is continuing to use urinal, instead of using restroom, despite physical abilities. When patient comes back to room and sits on bed he began, what seems voluntarily, shaking, and reports he shakes when he is anxious. When PT explained movement is "good" and that patient should try to walk with nurse and PT tech as much as possible, he seemed agreeable. Patient mostly limited by psychology at this time.   Follow Up Recommendations  Home health PT     Equipment Recommendations  None recommended by PT    Recommendations for Other Services       Precautions / Restrictions Precautions Precautions: Fall Restrictions Weight Bearing Restrictions: No    Mobility  Bed Mobility Overal bed mobility: Independent Bed Mobility: Supine to Sit     Supine to sit: Independent Sit to supine: Independent   General bed mobility comments: No issues getting in/out of bed  Transfers Overall transfer level: Modified independent Equipment used: Rolling walker (2 wheeled) Transfers: Sit to/from Stand Sit to Stand: Modified independent (Device/Increase time)         General transfer comment: Followed cuing for safety this session with good carry over  Ambulation/Gait Ambulation/Gait assistance: Modified independent (Device/Increase time) Gait Distance (Feet): 500 Feet Assistive device: Rolling walker (2 wheeled) Gait Pattern/deviations: Step-through pattern;Drifts right/left Gait velocity: decreased   General Gait  Details: Patient able to ambulate 500ft with good RW management following good carry over from previous session for RW management and safety management   Stairs             Wheelchair Mobility    Modified Rankin (Stroke Patients Only)       Balance Overall balance assessment: Modified Independent Sitting-balance support: Feet supported Sitting balance-Leahy Scale: Good     Standing balance support: No upper extremity supported Standing balance-Leahy Scale: Good Standing balance comment: Patient able to stand without UE support statically, completing reaching outside BOS without LOB. [place] with eyes open and close 30sec and tadem 10sec; unable to complete SLS                            Cognition Arousal/Alertness: Awake/alert Behavior During Therapy: Anxious Overall Cognitive Status: Within Functional Limits for tasks assessed                                 General Comments: Patient more alert/focused this session      Exercises Other Exercises Other Exercises: PT led patient through STS transfer where he is able to demonstrate better safety and good RW negotiation from previous session. Ambulated with patient over 500ft sith supervision with min cuing for increased foot clearance with good carry over following    General Comments        Pertinent Vitals/Pain Pain Assessment: No/denies pain    Home Living Family/patient expects to be discharged to:: Unsure Living Arrangements: Other (Comment)                    Prior Function            PT Goals (current goals can now be found in the care plan section)      Frequency    Min 2X/week      PT Plan      Co-evaluation              AM-PAC PT "6 Clicks" Mobility   Outcome Measure  Help needed turning from your back to your side while in a flat bed without using bedrails?: None Help needed moving from lying on your back to sitting on the side of a flat bed  without using bedrails?: None Help needed moving to and from a bed to a chair (including a wheelchair)?: None Help needed standing up from a chair using your arms (e.g., wheelchair or bedside chair)?: A Little Help needed to walk in hospital room?: None Help needed climbing 3-5 steps with a railing? : A Little 6 Click Score: 22    End of Session Equipment Utilized During Treatment: Gait belt Activity Tolerance: Patient tolerated treatment well Patient left: with bed alarm set;with call bell/phone within reach Nurse Communication: Mobility status PT Visit Diagnosis: Unsteadiness on feet (R26.81);Other abnormalities of gait and mobility (R26.89);Adult, failure to thrive (R62.7)     Time: 1134-1152 PT Time Calculation (min) (ACUTE ONLY): 18 min  Charges:  $Gait Training: 8-22 mins                     Chelsea Miller PT, DPT   Chelsea Miller 02/05/2019, 1:11 PM   

## 2019-02-05 NOTE — Progress Notes (Signed)
Nutrition Follow-up  RD working remotely.  DOCUMENTATION CODES:   Non-severe (moderate) malnutrition in context of social or environmental circumstances  INTERVENTION:  Continue Ensure Enlive po BID, each supplement provides 350 kcal and 20 grams of protein.  NUTRITION DIAGNOSIS:   Moderate Malnutrition related to social / environmental circumstances as evidenced by mild fat depletion, moderate fat depletion, mild muscle depletion, moderate muscle depletion.  Ongoing - addressing with nutrition interventions.  GOAL:   Patient will meet greater than or equal to 90% of their needs  Progressing.  MONITOR:   PO intake, Supplement acceptance, Weight trends, I & O's  REASON FOR ASSESSMENT:   LOS    ASSESSMENT:   58 year old male who presented to the ED on 3/10 with AMS. PMH of stroke, HTN, memory loss. Pt admitted with sepsis.  Per MD note patient is pleasantly confused with short term memory loss. Plan is to obtain guardian for patient and then work on placement at assisted living facility. Patient now eating 50-100% of meals per documentation in chart. She is drinking 2 bottles of Ensure Enlive most days and yesterday had one bottle. Patient has not had a BM since 3/22 but is on a bowel regimen. Abdomen still soft per RN documentation. Skin is intact.  Medications reviewed and include: Eliquis, Colace 100 mg BID, folic acid 1 mg daily, lisinopril, MVI daily, Miralax, senna-docusate, thiamine 100 mg daily.  Labs reviewed: CBG 116-127, Sodium 134.  Diet Order:   Diet Order            Diet - low sodium heart healthy        Diet regular Room service appropriate? Yes; Fluid consistency: Thin  Diet effective now             EDUCATION NEEDS:   Education needs have been addressed  Skin:  Skin Assessment: Reviewed RN Assessment  Last BM:  02/01/2019 - medium type 4  Height:   Ht Readings from Last 1 Encounters:  01/20/19 5\' 11"  (1.803 m)   Weight:   Wt Readings  from Last 1 Encounters:  01/29/19 86 kg   Ideal Body Weight:  78.2 kg  BMI:  Body mass index is 26.46 kg/m.  Estimated Nutritional Needs:   Kcal:  2150-2350  Protein:  110-125 grams  Fluid:  >/= 2.2 L  Helane Rima, MS, RD, LDN Office: [phone] Pager: [phone] After Hours/Weekend Pager: [phone]

## 2019-02-05 NOTE — Progress Notes (Signed)
[facility] - [place] at [facility]   PATIENT NAME: Maxwell Miller    MR#:  [id number]  DATE OF BIRTH:  [birth year]  SUBJECTIVE:  CHIEF COMPLAINT:   Chief Complaint  Patient presents with  . Altered Mental Status   -Patient continues to be confused states that he has a wife and a son  REVIEW OF SYSTEMS:  Review of Systems  Constitutional: Unable to provide due to confusion  DRUG ALLERGIES:  No Known Allergies  VITALS:  Blood pressure 113/76, pulse 73, temperature 98.1 F (36.7 C), temperature source Axillary, resp. rate 18, height 5\' 11"  (1.803 m), weight 86 kg, SpO2 96 %.  PHYSICAL EXAMINATION:  Physical Exam   GENERAL:  58 y.o.-year-old patient lying in the bed with no acute distress.  EYES: Pupils equal, round, reactive to light and accommodation. No scleral icterus. Extraocular muscles intact.  HEENT: Head atraumatic, normocephalic. Oropharynx and nasopharynx clear.  NECK:  Supple, no jugular venous distention. No thyroid enlargement, no tenderness.  LUNGS: Normal breath sounds bilaterally, no wheezing, rales,rhonchi or crepitation. No use of accessory muscles of respiration.  CARDIOVASCULAR: S1, S2 normal. No murmurs, rubs, or gallops.  ABDOMEN: Soft, nontender, nondistended. Bowel sounds present. No organomegaly or mass.  EXTREMITIES: No pedal edema, cyanosis, or clubbing.  NEUROLOGIC: Cranial nerves II through XII are intact. Muscle strength 5/5 in all extremities. Sensation intact. Gait not checked.  PSYCHIATRIC: The patient is alert and oriented to self.  SKIN: No obvious rash, lesion, or ulcer.    LABORATORY PANEL:   CBC Recent Labs  Lab 01/31/19 0524  WBC 8.8  HGB 16.5  HCT 49.5  PLT 458*   ------------------------------------------------------------------------------------------------------------------  Chemistries  Recent Labs  Lab 02/01/19 0411  NA 134*  K 4.3  CL 102  CO2 23  GLUCOSE 105*  BUN 19  CREATININE 1.23   CALCIUM 9.0   ------------------------------------------------------------------------------------------------------------------  Cardiac Enzymes No results for input(s): TROPONINI in the last 168 hours. ------------------------------------------------------------------------------------------------------------------  RADIOLOGY:  No results found.  EKG:   Orders placed or performed during the hospital encounter of 01/20/19  . EKG 12-Lead  . EKG 12-Lead  . ED EKG 12-Lead  . ED EKG 12-Lead    ASSESSMENT AND PLAN:   58 year old male with history of intracranial hemorrhage x2, short-term memory loss and erythrocytosis who presents with generalized weakness, confusion and fever.  1. Sepsis: likely due toCT suggestive of possible proctitis -[name] in the blood on 01/20/19 -finished trt with PO bactrim, per ID Appreciate ID input Patient also has an abnormal LP cultures, but negative so far Afebrile, wbc normal  2.  Confusion-short-term memory loss.  Had rapidly progressive dementia.  ?  Underlying vascular dementia as well. 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  No acute findings or no features of meningitis seen. -Seems to be at baseline.  Needs placement  3.Portal vein thrombosis - on eliquis-monitor closely especially given history of intraventricular hemorrhage in the past  4.  Generalized weakness-improving.  Physical therapy recommended home health  5.  Hypertension-on Norvasc, lisinopril  Currently no family.  Will need a  guardian APS notified- working on getting a guardian for the patient and then placement.  Appreciate social worker consult      All the records are reviewed and case discussed with Care Management/Social Workerr. Management plans discussed with the patient, family and they are in agreement.  CODE STATUS: Full Code  TOTAL TIME  TAKING CARE OF THIS  PATIENT: 22 minutes.   POSSIBLE D/C IN ? DAYS, DEPENDING ON CLINICAL CONDITION.   Auburn Bilberry M.D on 02/05/2019 at 11:41 AM  Between 7am to 6pm - Pager - [phone]  After 6pm go to [email or web address] - Social research officer, government  [facility]  Office  [phone]  CC: Primary care physician; Mable Paris, PA-C

## 2019-02-06 LAB — BASIC METABOLIC PANEL
Anion gap: 7 (ref 5–15)
BUN: 26 mg/dL — AB (ref 6–20)
CO2: 28 mmol/L (ref 22–32)
Calcium: 8.9 mg/dL (ref 8.9–10.3)
Chloride: 101 mmol/L (ref 98–111)
Creatinine, Ser: 1.21 mg/dL (ref 0.61–1.24)
GFR calc Af Amer: >60 mL/min (ref >60)
GFR calc non Af Amer: >60 mL/min (ref >60)
Glucose, Bld: 101 mg/dL — ABNORMAL HIGH (ref 70–99)
Potassium: 4.4 mmol/L (ref 3.5–5.1)
Sodium: 136 mmol/L (ref 135–145)

## 2019-02-06 LAB — CBC
HCT: 48.2 % (ref 39.0–52.0)
Hemoglobin: 15.8 g/dL (ref 13.0–17.0)
MCH: 29.2 pg (ref 26.0–34.0)
MCHC: 32.8 g/dL (ref 30.0–36.0)
MCV: 89.1 fL (ref 80.0–100.0)
Platelets: 366 10*3/uL (ref 150–400)
RBC: 5.41 MIL/uL (ref 4.22–5.81)
RDW: 13.5 % (ref 11.5–15.5)
WBC: 6.7 10*3/uL (ref 4.0–10.5)
nRBC: 0 % (ref 0.0–0.2)

## 2019-02-06 NOTE — Progress Notes (Signed)
PT Cancellation Note  Patient Details Name: Maxwell Miller MRN: [id number] DOB: 1961/09/25   Cancelled Treatment:    Reason Eval/Treat Not Completed: Patient's level of consciousness;Other (comment)(Patient unable to rouse/wake up. ) Patient not rousing to verbal and tactile stimuli. Will return at later time/date.   Precious Bard, PT, DPT   02/06/2019, 10:20 AM

## 2019-02-06 NOTE — Progress Notes (Signed)
[facility] -  at [facility]   PATIENT NAME: Maxwell Miller    MR#:  [id number]  DATE OF BIRTH:  [birth year]  SUBJECTIVE:  CHIEF COMPLAINT:   Chief Complaint  Patient presents with  . Altered Mental Status   -Patient continues to be confused   REVIEW OF SYSTEMS:  Review of Systems  Constitutional: Unable to provide due to confusion  DRUG ALLERGIES:  No Known Allergies  VITALS:  Blood pressure (!) 133/98, pulse 81, temperature 98.4 F (36.9 C), temperature source Oral, resp. rate 18, height 5\' 11"  (1.803 m), weight 86 kg, SpO2 99 %.  PHYSICAL EXAMINATION:  Physical Exam   GENERAL:  58 y.o.-year-old patient lying in the bed with no acute distress.  EYES: Pupils equal, round, reactive to light and accommodation. No scleral icterus. Extraocular muscles intact.  HEENT: Head atraumatic, normocephalic. Oropharynx and nasopharynx clear.  NECK:  Supple, no jugular venous distention. No thyroid enlargement, no tenderness.  LUNGS: Normal breath sounds bilaterally, no wheezing, rales,rhonchi or crepitation. No use of accessory muscles of respiration.  CARDIOVASCULAR: S1, S2 normal. No murmurs, rubs, or gallops.  ABDOMEN: Soft, nontender, nondistended. Bowel sounds present. No organomegaly or mass.  EXTREMITIES: No pedal edema, cyanosis, or clubbing.  NEUROLOGIC: Limited due to patient not able to follow all commands  pSYCHIATRIC: The patient is alert and oriented to self.  SKIN: No obvious rash, lesion, or ulcer.    LABORATORY PANEL:   CBC Recent Labs  Lab 02/06/19 0421  WBC 6.7  HGB 15.8  HCT 48.2  PLT 366   ------------------------------------------------------------------------------------------------------------------  Chemistries  Recent Labs  Lab 02/06/19 0421  NA 136  K 4.4  CL 101  CO2 28  GLUCOSE 101*  BUN 26*  CREATININE 1.21  CALCIUM 8.9    ------------------------------------------------------------------------------------------------------------------  Cardiac Enzymes No results for input(s): TROPONINI in the last 168 hours. ------------------------------------------------------------------------------------------------------------------  RADIOLOGY:  No results found.  EKG:   Orders placed or performed during the hospital encounter of 01/20/19  . EKG 12-Lead  . EKG 12-Lead  . ED EKG 12-Lead  . ED EKG 12-Lead    ASSESSMENT AND PLAN:   58 year old male with history of intracranial hemorrhage x2, short-term memory loss and erythrocytosis who presents with generalized weakness, confusion and fever.  1. Sepsis: likely due toCT suggestive of possible proctitis -[name] in the blood on 01/20/19 -finished trt with PO bactrim, per ID Appreciate ID input Patient also has an abnormal LP cultures, cultures but negative so far Afebrile, wbc normal  2.  Confusion-short-term memory loss.  Had rapidly progressive dementia.  ?  Underlying vascular dementia as well. 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  No acute findings or no features of meningitis seen. -Seems to be at baseline.  Needs placement  3.Portal vein thrombosis - on eliquis-monitor closely especially given history of intraventricular hemorrhage in the past  4.  Generalized weakness-improving.  Physical therapy recommended home health  5.  Hypertension-on Norvasc, lisinopril  Currently no family.  Will need a  guardian APS notified- working on getting a guardian for the patient and then placement.  Appreciate social worker consult      All the records are reviewed and case discussed with Care Management/Social Workerr. Management plans discussed with the patient, family and they are in agreement.  CODE STATUS: Full Code  TOTAL TIME TAKING CARE OF THIS PATIENT: 22  minutes.   POSSIBLE D/C IN ?  DAYS, DEPENDING ON CLINICAL CONDITION.   Auburn Bilberry M.D on 02/06/2019 at 12:57 PM  Between 7am to 6pm - Pager - [phone]  After 6pm go to [email or web address] - Social research officer, government  [facility]  Office  [phone]  CC: Primary care physician; Mable Paris, PA-C

## 2019-02-06 NOTE — Plan of Care (Signed)
Pt slept between care until late this afternoon, easily aroused. Alert to self and place.  Ate well late lunch and dinner.

## 2019-02-06 NOTE — Progress Notes (Signed)
Physical Therapy Treatment Patient Details Name: Maxwell Miller MRN: [id number] DOB: [birth year] Today's Date: 02/06/2019    History of Present Illness 58 yo male with onset of AMS wtih confusion and fever, sepsis was admitted and noted encephalopathy, has recent B thalamic hemorrhages.  PMHx:  portal vein thrombosis, CVA, HTN, erythrocytosis    PT Comments    Patient is more nervous than per previous sessions upon PT entering room. Reports he feels an emptyness in his head that gets worse with movement. Initially was not interested in participating with therapy however upon encouragement agreed to attempt. Patient confused initially, thinking he was in [place], upon orientation patient became philosophical and was challenged with task orientation. Patient required Min A for supine to sit. Was able to sit EOB independently however upon STS had buckling of bilateral knees requiring assistance to return to bed. Patient able to transition back to bed independently after performing seated EOB interventions indicating performance during transfers could potentially be due to patient's limited interest in activity performance. Current POC remains appropriate at this time.     Follow Up Recommendations  Home health PT     Equipment Recommendations  None recommended by PT    Recommendations for Other Services       Precautions / Restrictions Precautions Precautions: Fall Restrictions Weight Bearing Restrictions: No    Mobility  Bed Mobility Overal bed mobility: Needs Assistance Bed Mobility: Supine to Sit     Supine to sit: Min assist(required max encouragement) Sit to supine: Independent   General bed mobility comments: required max cueing and encouragement  Transfers Overall transfer level: Modified independent               General transfer comment: Patient refused STS this session, upon attempt bilateral knee buckling requiring PT guidance to sit safetly back on bed.    Ambulation/Gait                 Stairs             Wheelchair Mobility    Modified Rankin (Stroke Patients Only)       Balance                                            Cognition Arousal/Alertness: Awake/alert Behavior During Therapy: Anxious Overall Cognitive Status: Within Functional Limits for tasks assessed                                 General Comments: Patient confused, only able to orient to self, thought he was in [place], fearful of all movement requring max encouragement.      Exercises General Exercises - Lower Extremity Ankle Circles/Pumps: AROM;Strengthening;Both;10 reps;Supine Gluteal Sets: AROM;Both;10 reps;Supine(3 second holds) Long [employer]: AROM;Both;15 reps;Strengthening;Seated Heel Slides: AROM;Strengthening;Both;10 reps;Supine Hip Flexion/Marching: AROM;Strengthening;Both;15 reps;Seated Other Exercises Other Exercises: Attempted transfer supine to sit EOB requiring Min A and max encouragement. Unable to transition to standing position intially, requiring max verbal cueing/encouragement performed x mod A with immediate knee buckling requiring return to bed for safety. Able to return to supine from seated EOB independently indicating performance potentially was due to patient not wanting to mobilize self.     General Comments        Pertinent Vitals/Pain Pain Assessment: 0-10 Pain Location: head, whole body, unable to  give number Pain Descriptors / Indicators: Discomfort Pain Intervention(s): Monitored during session    Home Living                      Prior Function            PT Goals (current goals can now be found in the care plan section) Progress towards PT goals: Not progressing toward goals - comment(unable to assess standing mobility this session)    Frequency    Min 2X/week      PT Plan Current plan remains appropriate    Co-evaluation               AM-PAC PT "6 Clicks" Mobility   Outcome Measure  Help needed turning from your back to your side while in a flat bed without using bedrails?: None Help needed moving from lying on your back to sitting on the side of a flat bed without using bedrails?: None Help needed moving to and from a bed to a chair (including a wheelchair)?: None Help needed standing up from a chair using your arms (e.g., wheelchair or bedside chair)?: A Little Help needed to walk in hospital room?: None Help needed climbing 3-5 steps with a railing? : A Little 6 Click Score: 22    End of Session Equipment Utilized During Treatment: Gait belt Activity Tolerance: Treatment limited secondary to agitation(fearful of all movement) Patient left: with bed alarm set;with call bell/phone within reach;in bed Nurse Communication: Mobility status PT Visit Diagnosis: Unsteadiness on feet (R26.81);Other abnormalities of gait and mobility (R26.89);Adult, failure to thrive (R62.7)     Time: 1031-2811 PT Time Calculation (min) (ACUTE ONLY): 26 min  Charges:  $Therapeutic Exercise: 23-37 mins                     Precious Bard, PT, DPT     Precious Bard 02/06/2019, 3:58 PM

## 2019-02-07 NOTE — Progress Notes (Addendum)
[facility] - [place] at [facility]   PATIENT NAME: Maxwell Miller    MR#:  [id number]  DATE OF BIRTH:  1961/08/05  No new changes.  CHIEF COMPLAINT:  Chief Complaint  Patient presents with  . Altered Mental Status   -Patient continues to be confused   REVIEW OF SYSTEMS:  Review of Systems  Constitutional: Unable to provide due to confusion  DRUG ALLERGIES:  No Known Allergies  VITALS:  Blood pressure 125/84, pulse 74, temperature 98.6 F (37 C), resp. rate 18, height 5\' 11"  (1.803 m), weight 86 kg, SpO2 98 %.  PHYSICAL EXAMINATION:  Physical Exam   GENERAL:  58 y.o.-year-old patient lying in the bed with no acute distress.  EYES: Pupils equal, round, reactive to light and accommodation. No scleral icterus. Extraocular muscles intact.  HEENT: Head atraumatic, normocephalic. Oropharynx and nasopharynx clear.  NECK:  Supple, no jugular venous distention. No thyroid enlargement, no tenderness.  LUNGS: Normal breath sounds bilaterally, no wheezing, rales,rhonchi or crepitation. No use of accessory muscles of respiration.  CARDIOVASCULAR: S1, S2 normal. No murmurs, rubs, or gallops.  ABDOMEN: Soft, nontender, nond Baker istended. Bowel sounds present. No organomegaly or mass.  EXTREMITIES: No pedal edema, cyanosis, or clubbing.  NEUROLOGIC: Limited due to patient not able to follow all commands  pSYCHIATRIC: The patient is alert and oriented to self.  SKIN: No obvious rash, lesion, or ulcer.    LABORATORY PANEL:   CBC Recent Labs  Lab 02/06/19 0421  WBC 6.7  HGB 15.8  HCT 48.2  PLT 366   ------------------------------------------------------------------------------------------------------------------  Chemistries  Recent Labs  Lab 02/06/19 0421  NA 136  K 4.4  CL 101  CO2 28  GLUCOSE 101*  BUN 26*  CREATININE 1.21  CALCIUM 8.9    ------------------------------------------------------------------------------------------------------------------  Cardiac Enzymes No results for input(s): TROPONINI in the last 168 hours. ------------------------------------------------------------------------------------------------------------------  RADIOLOGY:  No results found.  EKG:   Orders placed or performed during the hospital encounter of 01/20/19  . EKG 12-Lead  . EKG 12-Lead  . ED EKG 12-Lead  . ED EKG 12-Lead    ASSESSMENT AND PLAN:   58 year old male with history of intracranial hemorrhage x2, short-term memory loss and erythrocytosis who presents with generalized weakness, confusion and fever.  1. Sepsis: likely due toCT suggestive of possible proctitis -[name] in the blood on 01/20/19 -finished trt with PO bactrim, per ID Appreciate ID input Patient also has an abnormal LP cultures, cultures but negative so far Afebrile, wbc normal  2.  Confusion-short-term memory loss.  Had rapidly progressive dementia.  ?  Underlying vascular dementia as well. 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  No acute findings or no features of meningitis seen. -Seems to be at baseline.  Needs placement  3.Portal vein thrombosis - on eliquis-monitor closely especially given history of intraventricular hemorrhage in the past  4.  Generalized weakness-improving.  Physical therapy recommended home health  5.  Hypertension-on Norvasc, lisinopril  Currently no family.  Will need a  guardian APS notified- working on getting a guardian for the patient and then placement.  Appreciate social worker consult      All the records are reviewed and case discussed with Care Management/Social Workerr. Management plans discussed with the patient, family and they are in agreement.  CODE STATUS: Full Code  TOTAL TIME TAKING CARE OF THIS PATIENT: 22  minutes.   POSSIBLE D/C IN ? DAYS,  DEPENDING ON CLINICAL CONDITION.   Katha Hamming M.D on 02/07/2019 at 11:06 AM  Between 7am to 6pm - Pager - [phone]  After 6pm go to [email or web address] - Social research officer, government  [facility]  Office  [phone]  CC: Primary care physician; Mable Paris, PA-C

## 2019-02-08 NOTE — Progress Notes (Signed)
[facility] - [place] at [facility]   PATIENT NAME: Maxwell Miller    MR#:  [id number]  DATE OF BIRTH:  Jun 29, 1961  No new changes  CHIEF COMPLAINT:  Chief Complaint  Patient presents with  . Altered Mental Status   -Patient continues to be confused   REVIEW OF SYSTEMS:  Review of Systems  Constitutional: Unable to provide due to confusion  DRUG ALLERGIES:  No Known Allergies  VITALS:  Blood pressure 134/86, pulse 82, temperature 98.2 F (36.8 C), temperature source Oral, resp. rate 20, height 5\' 11"  (1.803 m), weight 86 kg, SpO2 98 %.  PHYSICAL EXAMINATION:  Physical Exam   GENERAL:  58 y.o.-year-old patient lying in the bed with no acute distress.  EYES: Pupils equal, round, reactive to light and accommodation. No scleral icterus. Extraocular muscles intact.  HEENT: Head atraumatic, normocephalic. Oropharynx and nasopharynx clear.  NECK:  Supple, no jugular venous distention. No thyroid enlargement, no tenderness.  LUNGS: Normal breath sounds bilaterally, no wheezing, rales,rhonchi or crepitation. No use of accessory muscles of respiration.  CARDIOVASCULAR: S1, S2 normal. No murmurs, rubs, or gallops.  ABDOMEN: Soft, nontender, nond Baker istended. Bowel sounds present. No organomegaly or mass.  EXTREMITIES: No pedal edema, cyanosis, or clubbing.  NEUROLOGIC: Limited due to patient not able to follow all commands  pSYCHIATRIC: The patient is alert and oriented to self.  SKIN: No obvious rash, lesion, or ulcer.    LABORATORY PANEL:   CBC Recent Labs  Lab 02/06/19 0421  WBC 6.7  HGB 15.8  HCT 48.2  PLT 366   ------------------------------------------------------------------------------------------------------------------  Chemistries  Recent Labs  Lab 02/06/19 0421  NA 136  K 4.4  CL 101  CO2 28  GLUCOSE 101*  BUN 26*  CREATININE 1.21  CALCIUM 8.9    ------------------------------------------------------------------------------------------------------------------  Cardiac Enzymes No results for input(s): TROPONINI in the last 168 hours. ------------------------------------------------------------------------------------------------------------------  RADIOLOGY:  No results found.  EKG:   Orders placed or performed during the hospital encounter of 01/20/19  . EKG 12-Lead  . EKG 12-Lead  . ED EKG 12-Lead  . ED EKG 12-Lead    ASSESSMENT AND PLAN:   58 year old male with history of intracranial hemorrhage x2, short-term memory loss and erythrocytosis who presents with generalized weakness, confusion and fever.  1. Sepsis: likely due toCT suggestive of possible proctitis -[name] in the blood on 01/20/19 -finished trt with PO bactrim, per ID Appreciate ID input Patient also has an abnormal LP cultures, cultures but negative so far Afebrile, wbc normal  2.  Confusion-short-term memory loss.  Had rapidly progressive dementia.  ?  Underlying vascular dementia as well. 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  No acute findings or no features of meningitis seen. -Seems to be at baseline.  Needs placement  3.Portal vein thrombosis - on eliquis-monitor closely especially given history of intraventricular hemorrhage in the past  4.  Generalized weakness-improving.  Physical therapy recommended home health  5.  Hypertension-on Norvasc, lisinopril  Currently no family.  Will need a  guardian APS notified- working on getting a guardian for the patient and then placement.  Appreciate social worker consult      All the records are reviewed and case discussed with Care Management/Social Workerr. Management plans discussed with the patient, family and they are in agreement.  CODE STATUS: Full Code  TOTAL TIME TAKING CARE OF THIS PATIENT: 22  minutes.   POSSIBLE D/C  IN ? DAYS, DEPENDING ON CLINICAL CONDITION.   Katha Hamming M.D on 02/08/2019 at 12:29 PM  Between 7am to 6pm - Pager - [phone]  After 6pm go to [email or web address] - Social research officer, government  [facility] [place] [facility]  Office  [phone]  CC: Primary care physician; Mable Paris, PA-C

## 2019-02-08 NOTE — Progress Notes (Signed)
Pt has some delay in verbal responses and in following commands. Pulls instead of pushes with activities such as getting OOB. Pt weak/tremorous when up to BR with walker with 2+. Drinks supplements well; eats diet fair/poor. Denies pain or other issues.

## 2019-02-09 LAB — CBC
HCT: 44.8 % (ref 39.0–52.0)
Hemoglobin: 14.9 g/dL (ref 13.0–17.0)
MCH: 29.1 pg (ref 26.0–34.0)
MCHC: 33.3 g/dL (ref 30.0–36.0)
MCV: 87.5 fL (ref 80.0–100.0)
Platelets: 290 10*3/uL (ref 150–400)
RBC: 5.12 MIL/uL (ref 4.22–5.81)
RDW: 13.2 % (ref 11.5–15.5)
WBC: 6.9 10*3/uL (ref 4.0–10.5)
nRBC: 0 % (ref 0.0–0.2)

## 2019-02-09 LAB — CREATININE, SERUM
Creatinine, Ser: 1.03 mg/dL (ref 0.61–1.24)
GFR calc Af Amer: >60 mL/min (ref >60)
GFR calc non Af Amer: >60 mL/min (ref >60)

## 2019-02-09 NOTE — Plan of Care (Signed)
  Problem: Skin Integrity: Goal: Risk for impaired skin integrity will decrease Outcome: Progressing   Problem: Activity: Goal: Risk for activity intolerance will decrease Outcome: Progressing   Problem: Safety: Goal: Ability to remain free from injury will improve Outcome: Progressing   Problem: Skin Integrity: Goal: Risk for impaired skin integrity will decrease Outcome: Progressing   

## 2019-02-09 NOTE — Progress Notes (Signed)
PT Cancellation Note  Patient Details Name: Maxwell Miller MRN: [id number] DOB: [birth year]   Cancelled Treatment:    Reason Eval/Treat Not Completed: Fatigue/lethargy limiting ability to participate;Patient declined, no reason specified. Treatment attempted; pt lethargic, but eventually awoken through voice/touch. Encouraged pt to participate, but ultimately declined.    Scot Dock, PTA 02/09/2019, 1:24 PM

## 2019-02-09 NOTE — Progress Notes (Signed)
[facility] - [place] at [facility]   PATIENT NAME: Maxwell Miller    MR#:  [id number]  DATE OF BIRTH:  Apr 04, 1961  No new changes   -Patient continues to be confused intermittently. No new issues per RN  REVIEW OF SYSTEMS:  Review of Systems  Constitutional: Unable to provide due to confusion  DRUG ALLERGIES:  No Known Allergies  VITALS:  Blood pressure 120/88, pulse 69, temperature 98.7 F (37.1 C), temperature source Oral, resp. rate 16, height 5\' 11"  (1.803 m), weight 86 kg, SpO2 99 %.  PHYSICAL EXAMINATION:  Physical Exam   GENERAL:  58 y.o.-year-old patient lying in the bed with no acute distress.  EYES: Pupils equal, round, reactive to light and accommodation. No scleral icterus. Extraocular muscles intact.  HEENT: Head atraumatic, normocephalic. Oropharynx and nasopharynx clear.  NECK:  Supple, no jugular venous distention. No thyroid enlargement, no tenderness.  LUNGS: Normal breath sounds bilaterally, no wheezing, rales,rhonchi or crepitation. No use of accessory muscles of respiration.  CARDIOVASCULAR: S1, S2 normal. No murmurs, rubs, or gallops.  ABDOMEN: Soft, nontender, nond Baker istended. Bowel sounds present. No organomegaly or mass.  EXTREMITIES: No pedal edema, cyanosis, or clubbing.  NEUROLOGIC: Limited due to patient not able to follow all commands  pSYCHIATRIC: The patient is alert and oriented to self.  SKIN: No obvious rash, lesion, or ulcer.    LABORATORY PANEL:   CBC Recent Labs  Lab 02/09/19 0334  WBC 6.9  HGB 14.9  HCT 44.8  PLT 290   ------------------------------------------------------------------------------------------------------------------  Chemistries  Recent Labs  Lab 02/06/19 0421 02/09/19 0334  NA 136  --   K 4.4  --   CL 101  --   CO2 28  --   GLUCOSE 101*  --   BUN 26*  --   CREATININE 1.21 1.03  CALCIUM 8.9  --     ------------------------------------------------------------------------------------------------------------------  Cardiac Enzymes No results for input(s): TROPONINI in the last 168 hours. ------------------------------------------------------------------------------------------------------------------  RADIOLOGY:  No results found.  EKG:   Orders placed or performed during the hospital encounter of 01/20/19  . EKG 12-Lead  . EKG 12-Lead  . ED EKG 12-Lead  . ED EKG 12-Lead    ASSESSMENT AND PLAN:   58 year old male with history of intracranial hemorrhage x2, short-term memory loss and erythrocytosis who presents with generalized weakness, confusion and fever.  1. Sepsis: likely due toCT suggestive of possible proctitis -[name] in the blood on 01/20/19 -finished trt with PO bactrim, per ID Appreciate ID input Patient also has an abnormal LP cultures, cultures but negative so far Afebrile, wbc normal  2.  Confusion-short-term memory loss.  Had rapidly progressive dementia.  ?  Underlying vascular dementia as well. 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  No acute findings or no features of meningitis seen. -Seems to be at baseline.  Needs placement  3.Portal vein thrombosis - on eliquis-monitor closely especially given history of intraventricular hemorrhage in the past  4.  Generalized weakness-improving.  Physical therapy recommended home health  5.  Hypertension-on Norvasc, lisinopril  Currently no family.  Will need a  guardian APS notified- working on getting a guardian for the patient and then placement.  Appreciate Child psychotherapist consult   CODE STATUS: Full Code  TOTAL TIME TAKING CARE OF THIS PATIENT: 58 minutes.   POSSIBLE D/C IN ? DAYS, DEPENDING ON CLINICAL CONDITION.   Enedina Finner M.D on 02/09/2019 at  1:00 PM  Between 7am to 6pm - Pager - [phone]  After 6pm  go to [email or web address] - Social research officer, government  [facility] [place] [facility]  Office  [phone]  CC: Primary care physician; Mable Paris, PA-C

## 2019-02-10 NOTE — Progress Notes (Signed)
[facility] - [place] at [facility]   PATIENT NAME: Maxwell Miller    MR#:  [id number]  DATE OF BIRTH:  Dec 31, 1960  No new changes   -Patient continues to be confused intermittently. No new issues per RN  REVIEW OF SYSTEMS:  Review of Systems  Constitutional: Unable to provide due to confusion  DRUG ALLERGIES:  No Known Allergies  VITALS:  Blood pressure 114/83, pulse 77, temperature 98.2 F (36.8 C), temperature source Oral, resp. rate 16, height 5\' 11"  (1.803 m), weight 86 kg, SpO2 98 %.  PHYSICAL EXAMINATION:  Physical Exam   GENERAL:  58 y.o.-year-old patient lying in the bed with no acute distress.  EYES: Pupils equal, round, reactive to light and accommodation. No scleral icterus. Extraocular muscles intact.  HEENT: Head atraumatic, normocephalic. Oropharynx and nasopharynx clear.  NECK:  Supple, no jugular venous distention. No thyroid enlargement, no tenderness.  LUNGS: Normal breath sounds bilaterally, no wheezing, rales,rhonchi or crepitation. No use of accessory muscles of respiration.  CARDIOVASCULAR: S1, S2 normal. No murmurs, rubs, or gallops.  ABDOMEN: Soft, non tender  Bowel sounds present. No organomegaly or mass.  EXTREMITIES: No pedal edema, cyanosis, or clubbing.  NEUROLOGIC: Limited due to patient not able to follow all commands  pSYCHIATRIC:patient is alert and oriented to self.  SKIN: No obvious rash, lesion, or ulcer.    LABORATORY PANEL:   CBC Recent Labs  Lab 02/09/19 0334  WBC 6.9  HGB 14.9  HCT 44.8  PLT 290   ------------------------------------------------------------------------------------------------------------------  Chemistries  Recent Labs  Lab 02/06/19 0421 02/09/19 0334  NA 136  --   K 4.4  --   CL 101  --   CO2 28  --   GLUCOSE 101*  --   BUN 26*  --   CREATININE 1.21 1.03  CALCIUM 8.9  --     ------------------------------------------------------------------------------------------------------------------  Cardiac Enzymes No results for input(s): TROPONINI in the last 168 hours. ------------------------------------------------------------------------------------------------------------------  RADIOLOGY:  No results found.  EKG:   Orders placed or performed during the hospital encounter of 01/20/19  . EKG 12-Lead  . EKG 12-Lead  . ED EKG 12-Lead  . ED EKG 12-Lead    ASSESSMENT AND PLAN:   58 year old male with history of intracranial hemorrhage x2, short-term memory loss and erythrocytosis who presents with generalized weakness, confusion and fever.  1.Sepsis: likely due toCT suggestive of possible proctitis -[name] in the blood on 01/20/19 -finished trt with PO bactrim, per ID Appreciate ID input Patient also has an abnormal LP cultures, cultures but negative so far Afebrile, wbc normal  2.  Confusion-short-term memory loss.  Had rapidly progressive dementia.  ?  Underlying vascular dementia as well. 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  No acute findings or no features of meningitis seen. -Seems to be at baseline.  Needs placement  3.Portal vein thrombosis - on eliquis-monitor closely especially given history of intraventricular hemorrhage in the past  4.Generalized weakness-improving. -  Physical therapy recommended home health  5.Hypertension-on Norvasc, lisinopril  Currently no family.  Will need a  guardian APS notified- working on getting a guardian for the patient and then placement.  Appreciate Child psychotherapist consult   CODE STATUS: Full Code  TOTAL TIME TAKING CARE OF THIS PATIENT: 20 minutes.   POSSIBLE D/C IN ? DAYS, DEPENDING ON CLINICAL CONDITION.   Enedina Finner M.D on 02/10/2019 at 12:53 PM  Between 7am to 6pm -  Pager - [phone]  After 6pm go  to [email or web address] - Social research officer, government  [facility] [place] [facility]  Office  [phone]  CC: Primary care physician; Mable Paris, PA-C

## 2019-02-10 NOTE — TOC Progression Note (Signed)
Transition of Care [facility]) - Progression Note    Patient Details  Name: Maxwell Miller MRN: [id number] Date of Birth: December 23, 1960  Transition of Care [facility] CM/SW Contact  Ruthe Mannan, [place] Phone Number: 02/10/2019, 2:32 PM  Clinical Narrative: CSW spoke with Max Fickle at [facility] and she states that they are still working on placement and financials at this point for patient. CSW will continue to follow for discharge planning.       Expected Discharge Plan: Home w Home Health Services Barriers to Discharge: Family Issues, Transportation  Expected Discharge Plan and Services Expected Discharge Plan: Home w Home Health Services   Discharge Planning Services: CM Consult Post Acute Care Choice: Home Health, Durable Medical Equipment   Expected Discharge Date: 01/24/19                   HH Arranged: RN, PT, Nurse's Aide HH Agency: [facility]   Social Determinants of Health (SDOH) Interventions    Readmission Risk Interventions No flowsheet data found.

## 2019-02-10 NOTE — Progress Notes (Signed)
Physical Therapy Treatment Patient Details Name: Maxwell Miller MRN: [id number] DOB: 11-26-1960 Today's Date: 02/10/2019    History of Present Illness 58 yo male with onset of AMS wtih confusion and fever, sepsis was admitted and noted encephalopathy, has recent B thalamic hemorrhages.  PMHx:  portal vein thrombosis, CVA, HTN, erythrocytosis    PT Comments    Patient is overall lethargic during the entirety of the session and requires max cueing for any type of compliance with therapy. Patient unable to perform ambulation at previous level of independence requiring +2 assistance to perform side stepping with RW. Patient demonstrates poor standing balance and requires cueing to lean forward with standing. Patient has LOB without therapist support requiring minA to stand. Patient will benefit from further skilled therapy to maintain strength and further improve limitations to better perform ADLs. Recommend SNF with supervision/assistance as patient is not safe independently and has had a recent decrease in function.    Follow Up Recommendations  SNF;Supervision/Assistance - 24 hour     Equipment Recommendations  None recommended by PT    Recommendations for Other Services       Precautions / Restrictions Precautions Precautions: Fall    Mobility  Bed Mobility Overal bed mobility: Needs Assistance Bed Mobility: Supine to Sit     Supine to sit: Max assist Sit to supine: Max assist   General bed mobility comments: Patient does not willfully perform movements but is able to assist if supported by staff  Transfers Overall transfer level: Needs assistance Equipment used: Rolling walker (2 wheeled) Transfers: Sit to/from Stand Sit to Stand: Min assist;Mod assist         General transfer comment: min-modA from sitting to standing. Requires heavy use of hands once in standing and max cueing for sitting back into bed.   Ambulation/Gait Ambulation/Gait assistance: Max assist;+2  physical assistance Gait Distance (Feet): 0.5 Feet Assistive device: Rolling walker (2 wheeled)       General Gait Details: Patient unable to perform forward ambulation with therapist requiring assist to LE to move R LE 6  inches to the R with use of RW   Stairs             Wheelchair Mobility    Modified Rankin (Stroke Patients Only)       Balance                                            Cognition Arousal/Alertness: Lethargic Behavior During Therapy: Anxious;Flat affect                                   General Comments: Patient confused, only able to orient to self, , fearful of all movement requring max encouragement.      Exercises General Exercises - Lower Extremity Quad Sets: AAROM;5 reps;Both Heel Slides: AAROM;Strengthening;Both;5 reps    General Comments        Pertinent Vitals/Pain Pain Assessment: 0-10 Pain Score: 9  Pain Location: Does not give a location Pain Descriptors / Indicators: Discomfort Pain Intervention(s): Monitored during session    Home Living                      Prior Function            PT Goals (current goals can  now be found in the care plan section) Acute Rehab PT Goals Patient Stated Goal: to walk with PT  PT Goal Formulation: With patient Time For Goal Achievement: 02/24/19 Potential to Achieve Goals: Fair Progress towards PT goals: Not progressing toward goals - comment    Frequency    Min 2X/week      PT Plan Discharge plan needs to be updated    Co-evaluation              AM-PAC PT "6 Clicks" Mobility   Outcome Measure  Help needed turning from your back to your side while in a flat bed without using bedrails?: A Lot Help needed moving from lying on your back to sitting on the side of a flat bed without using bedrails?: A Lot Help needed moving to and from a bed to a chair (including a wheelchair)?: A Lot Help needed standing up from a chair using  your arms (e.g., wheelchair or bedside chair)?: A Lot Help needed to walk in hospital room?: A Lot Help needed climbing 3-5 steps with a railing? : Total 6 Click Score: 11    End of Session Equipment Utilized During Treatment: Gait belt Activity Tolerance: Patient limited by lethargy Patient left: with bed alarm set;with call bell/phone within reach;in bed Nurse Communication: Mobility status PT Visit Diagnosis: Unsteadiness on feet (R26.81);Other abnormalities of gait and mobility (R26.89);Adult, failure to thrive (R62.7)     Time: 1415-1440 PT Time Calculation (min) (ACUTE ONLY): 25 min  Charges:  $Therapeutic Exercise: 8-22 mins $Therapeutic Activity: 8-22 mins                    Myrene Galas, PT DPT 02/10/19, 3:07 PM

## 2019-02-11 NOTE — Progress Notes (Signed)
[employer] - [place] at [facility]   PATIENT NAME: Maxwell Miller    MR#:  [id number]  DATE OF BIRTH:  1961-06-07  No new changes   -Patient continues to be confused intermittently. No new issues per RN  REVIEW OF SYSTEMS:  Review of Systems  Constitutional: Unable to provide due to confusion  DRUG ALLERGIES:  No Known Allergies  VITALS:  Blood pressure 123/88, pulse 82, temperature 98.4 F (36.9 C), temperature source Oral, resp. rate 16, height 5\' 11"  (1.803 m), weight 86 kg, SpO2 99 %.  PHYSICAL EXAMINATION:  Physical Exam   GENERAL:  58 y.o.-year-old patient lying in the bed with no acute distress.  EYES: Pupils equal, round, reactive to light and accommodation. No scleral icterus. Extraocular muscles intact.  HEENT: Head atraumatic, normocephalic. Oropharynx and nasopharynx clear.  NECK:  Supple, no jugular venous distention. No thyroid enlargement, no tenderness.  LUNGS: Normal breath sounds bilaterally, no wheezing, rales,rhonchi or crepitation. No use of accessory muscles of respiration.  CARDIOVASCULAR: S1, S2 normal. No murmurs, rubs, or gallops.  ABDOMEN: Soft, non tender  Bowel sounds present. No organomegaly or mass.  EXTREMITIES: No pedal edema, cyanosis, or clubbing.  NEUROLOGIC: Limited due to patient not able to follow all commands  pSYCHIATRIC:patient is alert and oriented to self.  SKIN: No obvious rash, lesion, or ulcer.    LABORATORY PANEL:   CBC Recent Labs  Lab 02/09/19 0334  WBC 6.9  HGB 14.9  HCT 44.8  PLT 290   ------------------------------------------------------------------------------------------------------------------  Chemistries  Recent Labs  Lab 02/06/19 0421 02/09/19 0334  NA 136  --   K 4.4  --   CL 101  --   CO2 28  --   GLUCOSE 101*  --   BUN 26*  --   CREATININE 1.21 1.03  CALCIUM 8.9  --     ------------------------------------------------------------------------------------------------------------------  Cardiac Enzymes No results for input(s): TROPONINI in the last 168 hours. ------------------------------------------------------------------------------------------------------------------  RADIOLOGY:  No results found.  EKG:   Orders placed or performed during the hospital encounter of 01/20/19  . EKG 12-Lead  . EKG 12-Lead  . ED EKG 12-Lead  . ED EKG 12-Lead    ASSESSMENT AND PLAN:   58 year old male with history of intracranial hemorrhage x2, short-term memory loss and erythrocytosis who presents with generalized weakness, confusion and fever.  1.Sepsis: likely due toCT suggestive of possible proctitis -[name] in the blood on 01/20/19 -finished trt with PO bactrim, per ID  2.  Confusion-short-term memory loss.  Had rapidly progressive dementia.  ?  Underlying vascular dementia as well. 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  No acute findings or no features of meningitis seen. -Seems to be at baseline.  Needs placement  3.Portal vein thrombosis - on eliquis-monitor closely especially given history of intraventricular hemorrhage in the past  4.Generalized weakness-improving. -  Physical therapy recommended home health  5.Hypertension-on Norvasc, lisinopril  Currently no family.  Will need a  guardian APS notified- working on getting a guardian for the patient and then placement.  Appreciate Child psychotherapist consult   CODE STATUS: Full Code  TOTAL TIME TAKING CARE OF THIS PATIENT: 20 minutes.   POSSIBLE D/C IN ? DAYS, DEPENDING ON CLINICAL CONDITION.   Enedina Finner M.D on 02/11/2019 at 11:40 AM  Between 7am to 6pm - Pager - [phone]  After 6pm go to [email or web address] - password [email or web address]  [employer]  [phone]  CC: Primary care physician;  Mable Paris, PA-C

## 2019-02-11 NOTE — Progress Notes (Signed)
Physical Therapy Treatment Patient Details Name: Maxwell Miller MRN: [id number] DOB: 1961/09/15 Today's Date: 02/11/2019    History of Present Illness 58 yo male with onset of AMS wtih confusion and fever, sepsis was admitted and noted encephalopathy, has recent B thalamic hemorrhages.  PMHx:  portal vein thrombosis, CVA, HTN, erythrocytosis    PT Comments    Patient requires constant cueing throughout the entirety of the session. Patient required maxA for all transfers and required mod-maxA when standing to balance balance. Patient does not follow cueing while sitting to maintain balance. Since patient was able to ambulate 3 days prior with little to no difficulties, it indicates baseline strength. Patient lethargic during the entire session, was able to perform sit to stand x3 with maxA. Recommend SNF with supervision assistance secondary to change in status.    Follow Up Recommendations  SNF;Supervision/Assistance - 24 hour     Equipment Recommendations  None recommended by PT    Recommendations for Other Services       Precautions / Restrictions Precautions Precautions: Fall    Mobility  Bed Mobility Overal bed mobility: Needs Assistance Bed Mobility: Supine to Sit     Supine to sit: Max assist Sit to supine: Max assist   General bed mobility comments: Patient does not willfully perform movements but is able to assist if supported by staff  Transfers Overall transfer level: Needs assistance Equipment used: (Unable to perform with RW; required use of maxA for transfer) Transfers: Sit to/from [employer] Sit to Stand: Max assist   Squat pivot transfers: Max assist     General transfer comment: MaxA when performing squat pivot transfers patient requires heavy cueing to perform transfering task and not always compliant.   Ambulation/Gait                 Stairs             Wheelchair Mobility    Modified Rankin (Stroke Patients  Only)       Balance Overall balance assessment: Needs assistance Sitting-balance support: Feet supported;Bilateral upper extremity supported Sitting balance-Leahy Scale: Poor Sitting balance - Comments: unable to perform for greater than 2 seconds without PT support Postural control: Posterior lean Standing balance support: Bilateral upper extremity supported Standing balance-Leahy Scale: Poor Standing balance comment: Patient required modA-maxA to perform prolonged standing. Patient pushes through the back on his LEs onto the edge of the bed.                             Cognition Arousal/Alertness: Lethargic Behavior During Therapy: Anxious;Flat affect Overall Cognitive Status: Within Functional Limits for tasks assessed                                 General Comments: Patient confused, only able to orient to self, fearful of all movement requring max encouragement.      Exercises Other Exercises Other Exercises: Performed sit to stand with maxA x 3, squat pivot transfer at the end of the session. Patient demonstrates poor sitting balance and required minA to maintain sitting posture without R and posterior leaning.     General Comments        Pertinent Vitals/Pain Pain Assessment: No/denies pain    Home Living                      Prior Function  PT Goals (current goals can now be found in the care plan section)      Frequency    Min 2X/week      PT Plan Current plan remains appropriate    Co-evaluation              AM-PAC PT "6 Clicks" Mobility   Outcome Measure  Help needed turning from your back to your side while in a flat bed without using bedrails?: A Lot Help needed moving from lying on your back to sitting on the side of a flat bed without using bedrails?: A Lot Help needed moving to and from a bed to a chair (including a wheelchair)?: A Lot Help needed standing up from a chair using your arms  (e.g., wheelchair or bedside chair)?: A Lot Help needed to walk in hospital room?: A Lot Help needed climbing 3-5 steps with a railing? : Total 6 Click Score: 11    End of Session Equipment Utilized During Treatment: Gait belt Activity Tolerance: Patient limited by lethargy Patient left: in chair;with chair alarm set Nurse Communication: Mobility status PT Visit Diagnosis: Unsteadiness on feet (R26.81);Other abnormalities of gait and mobility (R26.89);Adult, failure to thrive (R62.7)     Time: 1005-1035 PT Time Calculation (min) (ACUTE ONLY): 30 min  Charges:  $Therapeutic Activity: 23-37 mins                    Myrene Galas, PT DPT 02/11/19, 10:52 AM

## 2019-02-12 LAB — BASIC METABOLIC PANEL
Anion gap: 7 (ref 5–15)
BUN: 23 mg/dL — ABNORMAL HIGH (ref 6–20)
CO2: 26 mmol/L (ref 22–32)
Calcium: 8.7 mg/dL — ABNORMAL LOW (ref 8.9–10.3)
Chloride: 106 mmol/L (ref 98–111)
Creatinine, Ser: 0.94 mg/dL (ref 0.61–1.24)
GFR calc Af Amer: >60 mL/min (ref >60)
GFR calc non Af Amer: >60 mL/min (ref >60)
Glucose, Bld: 84 mg/dL (ref 70–99)
Potassium: 3.8 mmol/L (ref 3.5–5.1)
Sodium: 139 mmol/L (ref 135–145)

## 2019-02-12 LAB — CBC
HCT: 42.5 % (ref 39.0–52.0)
Hemoglobin: 13.9 g/dL (ref 13.0–17.0)
MCH: 28.8 pg (ref 26.0–34.0)
MCHC: 32.7 g/dL (ref 30.0–36.0)
MCV: 88.2 fL (ref 80.0–100.0)
Platelets: 238 10*3/uL (ref 150–400)
RBC: 4.82 MIL/uL (ref 4.22–5.81)
RDW: 13.2 % (ref 11.5–15.5)
WBC: 8 10*3/uL (ref 4.0–10.5)
nRBC: 0 % (ref 0.0–0.2)

## 2019-02-12 NOTE — Progress Notes (Signed)
   [employer] - [place] at [facility]   PATIENT NAME: Maxwell Miller    MR#:  [id number]  DATE OF BIRTH:  17-May-1961  No new changes   -Patient continues to be confused intermittently. No new issues per RN  REVIEW OF SYSTEMS:  Review of Systems  Constitutional: Unable to provide due to confusion  DRUG ALLERGIES:  No Known Allergies  VITALS:  Blood pressure 125/89, pulse 75, temperature 98.5 F (36.9 C), temperature source Axillary, resp. rate 15, height 5\' 11"  (1.803 m), weight 87.6 kg, SpO2 100 %.  PHYSICAL EXAMINATION:  Physical Exam   GENERAL:  58 y.o.-year-old patient lying in the bed with no acute distress.  EYES: Pupils equal, round, reactive to light and accommodation. No scleral icterus. Extraocular muscles intact.  HEENT: Head atraumatic, normocephalic. Oropharynx and nasopharynx clear.  NECK:  Supple, no jugular venous distention. No thyroid enlargement, no tenderness.  LUNGS: Normal breath sounds bilaterally, no wheezing, rales,rhonchi or crepitation. No use of accessory muscles of respiration.  CARDIOVASCULAR: S1, S2 normal. No murmurs, rubs, or gallops.  ABDOMEN: Soft, non tender  Bowel sounds present. No organomegaly or mass.  EXTREMITIES: No pedal edema, cyanosis, or clubbing.  NEUROLOGIC: Limited due to patient not able to follow all commands  pSYCHIATRIC:patient is alert and oriented to self.  SKIN: No obvious rash, lesion, or ulcer.    LABORATORY PANEL:   CBC Recent Labs  Lab 02/12/19 0306  WBC 8.0  HGB 13.9  HCT 42.5  PLT 238   ------------------------------------------------------------------------------------------------------------------  Chemistries  Recent Labs  Lab 02/12/19 0306  NA 139  K 3.8  CL 106  CO2 26  GLUCOSE 84  BUN 23*  CREATININE 0.94  CALCIUM 8.7*   ------------------------------------------------------------------------------------------------------------------  Cardiac Enzymes No results for  input(s): TROPONINI in the last 168 hours. ------------------------------------------------------------------------------------------------------------------  RADIOLOGY:  No results found.  EKG:   Orders placed or performed during the hospital encounter of 01/20/19  . EKG 12-Lead  . EKG 12-Lead  . ED EKG 12-Lead  . ED EKG 12-Lead    ASSESSMENT AND PLAN:   59 year old male with history of intracranial hemorrhage x2, short-term memory loss and erythrocytosis who presents with generalized weakness, confusion and fever.  1.Sepsis: likely due toCT suggestive of possible proctitis -[name] in the blood on 01/20/19 -finished trt with PO bactrim, per ID  2.  Confusion-short-term memory loss.  Had rapidly progressive dementia.  ?  Underlying vascular dementia as well. 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  No acute findings or no features of meningitis seen. -Seems to be at baseline.  Needs placement  3.Portal vein thrombosis - on eliquis-monitor closely especially given history of intraventricular hemorrhage in the past  4.Generalized weakness-improving. -  Physical therapy recommended home health  5.Hypertension-on Norvasc, lisinopril  Currently no family.  Will need a  guardian APS notified- working on getting a guardian for the patient and then placement-very CHALLENGING  Appreciate social worker consult   CODE STATUS: Full Code  TOTAL TIME TAKING CARE OF THIS PATIENT: 20 minutes.   POSSIBLE D/C IN ? DAYS, DEPENDING ON CLINICAL CONDITION.   Enedina Finner M.D on 02/12/2019 at 10:53 AM  Between 7am to 6pm - Pager - [phone]  After 6pm go to [email or web address] - Social research officer, government  [employer] [facility]  Office  [phone]  CC: Primary care physician; Mable Paris, PA-C

## 2019-02-12 NOTE — Progress Notes (Signed)
Physical Therapy Treatment Patient Details Name: Maxwell Miller MRN: [id number] DOB: 03/28/1961 Today's Date: 02/12/2019    History of Present Illness 58 yo male with onset of AMS wtih confusion and fever, sepsis was admitted and noted encephalopathy, has recent B thalamic hemorrhages.  PMHx:  portal vein thrombosis, CVA, HTN, erythrocytosis    PT Comments    Patient continues to not produce volitional movement with excessive prompts but is able to assist with motion once movement has been initiated by PT. Patient required maxA for bed mobility, sit<>stand, and squat pivot transfer. Continued to focus on weight bearing activities as patient has demonstrated progressive LE weakness over the past 3 days. While standing patient's LEs had significant tremoring requiring a knee block from PT.  Patient demonstrates poor carryover overall. Will continue to focus on performing functional activities to address LE weakness. Continue to recommend D/C to SNF with 24 hour assitance secondary to mobility and cognitive issues.   Follow Up Recommendations  SNF;Supervision/Assistance - 24 hour     Equipment Recommendations  None recommended by PT    Recommendations for Other Services       Precautions / Restrictions Precautions Precautions: Fall Restrictions Weight Bearing Restrictions: No    Mobility  Bed Mobility Overal bed mobility: Needs Assistance Bed Mobility: Supine to Sit     Supine to sit: Max assist Sit to supine: Max assist   General bed mobility comments: Patient does not willfully perform movements but is able to assist if supported   Transfers Overall transfer level: Needs assistance Equipment used: None(Unable to perform with RW; required use of maxA for transfer) Transfers: Sit to/from [employer] Sit to Stand: Max assist   Squat pivot transfers: Max assist     General transfer comment: Patient performed sit to stand and squat pivot transfers with maxA;  patient stands for 1 min then performs a posterior lean to sit back down  Ambulation/Gait                 Stairs             Wheelchair Mobility    Modified Rankin (Stroke Patients Only)       Balance Overall balance assessment: Needs assistance Sitting-balance support: Feet supported;Bilateral upper extremity supported Sitting balance-Leahy Scale: Fair Sitting balance - Comments: Patient requires interrmitent UE support to perform sitting EOB performed x 6 min Postural control: Posterior lean Standing balance support: Bilateral upper extremity supported Standing balance-Leahy Scale: Poor Standing balance comment: Patient required modA-maxA to perform prolonged standing without therapist support perpformed 40sec                             Cognition Arousal/Alertness: Lethargic Behavior During Therapy: Anxious;Flat affect Overall Cognitive Status: Within Functional Limits for tasks assessed                                 General Comments: Patient requires max cueing to perform any volitional movements. When asked, patient frequently replies with, "I don't know".      Exercises Other Exercises Other Exercises: Perform sitting EOB x24min, patient does not perform exercises volitionally requires max cues and still does not perform. Held off on most ther ex secondary to patient not perform UE and LE movement    General Comments        Pertinent Vitals/Pain Pain Assessment: No/denies pain  Home Living                      Prior Function            PT Goals (current goals can now be found in the care plan section) Acute Rehab PT Goals Patient Stated Goal: to walk with PT  PT Goal Formulation: With patient Time For Goal Achievement: 02/24/19 Potential to Achieve Goals: Fair Progress towards PT goals: Progressing toward goals    Frequency    Min 2X/week      PT Plan Current plan remains appropriate     Co-evaluation              AM-PAC PT "6 Clicks" Mobility   Outcome Measure  Help needed turning from your back to your side while in a flat bed without using bedrails?: A Lot Help needed moving from lying on your back to sitting on the side of a flat bed without using bedrails?: A Lot Help needed moving to and from a bed to a chair (including a wheelchair)?: A Lot Help needed standing up from a chair using your arms (e.g., wheelchair or bedside chair)?: Total Help needed to walk in hospital room?: Total Help needed climbing 3-5 steps with a railing? : Total 6 Click Score: 9    End of Session Equipment Utilized During Treatment: Gait belt Activity Tolerance: Patient limited by lethargy Patient left: in chair;with chair alarm set Nurse Communication: Mobility status PT Visit Diagnosis: Unsteadiness on feet (R26.81);Other abnormalities of gait and mobility (R26.89);Adult, failure to thrive (R62.7)     Time: 0071-2197 PT Time Calculation (min) (ACUTE ONLY): 18 min  Charges:  $Therapeutic Activity: 8-22 mins                     Myrene Galas, PT DPT 02/12/2019, 4:03 PM

## 2019-02-12 NOTE — Progress Notes (Signed)
Nutrition Follow-up  RD working remotely.  DOCUMENTATION CODES:   Non-severe (moderate) malnutrition in context of social or environmental circumstances  INTERVENTION:  Continue Ensure Enlive po BID, each supplement provides 350 kcal and 20 grams of protein.  NUTRITION DIAGNOSIS:   Moderate Malnutrition related to social / environmental circumstances as evidenced by mild fat depletion, moderate fat depletion, mild muscle depletion, moderate muscle depletion.  Ongoing - addressing with Ensure Enlive.  GOAL:   Patient will meet greater than or equal to 90% of their needs  Progressing.  MONITOR:   PO intake, Supplement acceptance, Weight trends, I & O's  REASON FOR ASSESSMENT:   LOS    ASSESSMENT:   58 year old male who presented to the ED on 3/10 with AMS. PMH of stroke, HTN, memory loss. Pt admitted with sepsis.  Per chart patient continues to be intermittently confused. APS is working on guardianship so patient can be placed. Per chart patient is finishing 40-80% of his meals. He is drinking both bottles of Ensure Enlive daily. Patient is now having more regular bowel movements.  Medications reviewed and include: Eliquis, Colace 100 mg BID, folic acid 1 mg daily, lisinopril, MVI daily, Miralax, senna-docusate, Flomax, thiamine 100 mg daily.  Labs reviewed.  Diet Order:   Diet Order            Diet - low sodium heart healthy        Diet regular Room service appropriate? Yes; Fluid consistency: Thin  Diet effective now             EDUCATION NEEDS:   Education needs have been addressed  Skin:  Skin Assessment: Reviewed RN Assessment  Last BM:  02/11/2019 - large type 4  Height:   Ht Readings from Last 1 Encounters:  01/20/19 5\' 11"  (1.803 m)   Weight:   Wt Readings from Last 1 Encounters:  02/12/19 87.6 kg   Ideal Body Weight:  78.2 kg  BMI:  Body mass index is 26.93 kg/m.  Estimated Nutritional Needs:   Kcal:  2150-2350  Protein:  110-125  grams  Fluid:  >/= 2.2 L  Helane Rima, MS, RD, LDN Office: [phone] Pager: [phone] After Hours/Weekend Pager: [phone]

## 2019-02-12 NOTE — Progress Notes (Signed)
PT and RN transferred patient to chair, Patient requires constant cueing throughout the entirety of transfer. Patient does not follow cueing while sitting or standing to maintain balance. Pt would not respond to any questions and required to nurse to support his back while sitting on edge of bed or patient would fall back. PT had tremors when standing.

## 2019-02-13 LAB — GLUCOSE, CAPILLARY
Glucose-Capillary: 83 mg/dL (ref 70–99)
Glucose-Capillary: 157 mg/dL — ABNORMAL HIGH (ref 70–99)
Glucose-Capillary: 138 mg/dL — ABNORMAL HIGH (ref 70–99)

## 2019-02-13 MED ORDER — MIRTAZAPINE 15 MG PO TBDP
15.0000 mg | ORAL_TABLET | Freq: Every day | ORAL | Status: DC
  Administered 2019-02-13 – 2019-03-31 (×45): 15 mg via ORAL
  Filled 2019-02-13 (×49): qty 1

## 2019-02-13 NOTE — TOC Progression Note (Signed)
Transition of Care [facility]) - Progression Note    Patient Details  Name: Maxwell Miller MRN: [id number] Date of Birth: [birth year]  Transition of [facility] CM/SW Contact  Ruthe Mannan, [place] Phone Number: 02/13/2019, 10:58 AM  Clinical Narrative:   CSW attempted to contact [place] DSS regarding patient. CSW was unsuccessful in reaching DSS and left a voicemail for DSS worker Arthur Holms. CSW will continue to follow for discharge planning.     Expected Discharge Plan: Home w Home Health Services Barriers to Discharge: Family Issues, Transportation  Expected Discharge Plan and Services Expected Discharge Plan: Home w Home Health Services   Discharge Planning Services: CM Consult Post Acute Care Choice: Home Health, Durable Medical Equipment   Expected Discharge Date: 01/24/19                   HH Arranged: RN, PT, Nurse's Aide HH Agency: [facility]   Social Determinants of Health (SDOH) Interventions    Readmission Risk Interventions No flowsheet data found.

## 2019-02-13 NOTE — Progress Notes (Signed)
Physical Therapy Treatment Patient Details Name: Maxwell Miller MRN: [id number] DOB: [birth year] Today's Date: 02/13/2019    History of Present Illness 58 yo male with onset of AMS wtih confusion and fever, sepsis was admitted and noted encephalopathy, has recent B thalamic hemorrhages.  PMHx:  portal vein thrombosis, CVA, HTN, erythrocytosis.  Pt had walked as far as 500 ft with PT this hosptialization, but has shown consistent decline in phyiscal and mental status.    PT Comments    Pt was very limited this date.  Able to open eyes briefly with cuing but distant look in eyes with no verbalizations or meaningful interaction.  He was unable to actively participate with bed exercises and was PROM with all tasks.  Not at all appropriate this date to try to sit up, much less stand/walk.  Pt has been inconsistent, but clearly the most cognitively limited with PT this date.  Will maintain on caseload at this time and assess appropriateness moving forward per ability to participate.  Follow Up Recommendations  SNF;Supervision/Assistance - 24 hour     Equipment Recommendations       Recommendations for Other Services       Precautions / Restrictions Precautions Precautions: Fall Restrictions Weight Bearing Restrictions: No    Mobility  Bed Mobility               General bed mobility comments: deferred today, pt unable to keep eyes open of have any meaningful interaction  Transfers                    Ambulation/Gait                 Stairs             Wheelchair Mobility    Modified Rankin (Stroke Patients Only)       Balance                                            Cognition Arousal/Alertness: Lethargic Behavior During Therapy: (unable to interact) Overall Cognitive Status: Impaired/Different from baseline                                 General Comments: Pt has seemingly had consistent decline in cognition  over the last week      Exercises General Exercises - Lower Extremity Ankle Circles/Pumps: PROM;10 reps;Both Short Arc Quad: PROM;10 reps;Both Heel Slides: PROM;10 reps;Both Hip ABduction/ADduction: PROM;10 reps;Both    General Comments        Pertinent Vitals/Pain Pain Location: Pt with very little interaction, no obvious signs of pain    Home Living                      Prior Function            PT Goals (current goals can now be found in the care plan section) Acute Rehab PT Goals PT Goal Formulation: Patient unable to participate in goal setting Time For Goal Achievement: 02/27/19 Potential to Achieve Goals: Fair Progress towards PT goals: Not progressing toward goals - comment(Pt with continued cognitive decline)    Frequency    Min 2X/week      PT Plan Current plan remains appropriate    Co-evaluation  AM-PAC PT "6 Clicks" Mobility   Outcome Measure  Help needed turning from your back to your side while in a flat bed without using bedrails?: Total Help needed moving from lying on your back to sitting on the side of a flat bed without using bedrails?: Total Help needed moving to and from a bed to a chair (including a wheelchair)?: Total Help needed standing up from a chair using your arms (e.g., wheelchair or bedside chair)?: Total Help needed to walk in hospital room?: Total Help needed climbing 3-5 steps with a railing? : Total 6 Click Score: 6    End of Session   Activity Tolerance: Patient limited by lethargy Patient left: with bed alarm set   PT Visit Diagnosis: Unsteadiness on feet (R26.81);Other abnormalities of gait and mobility (R26.89);Adult, failure to thrive (R62.7)     Time: 3888-7579 PT Time Calculation (min) (ACUTE ONLY): 15 min  Charges:  $Therapeutic Exercise: 8-22 mins                     Malachi Pro, DPT 02/13/2019, 2:49 PM

## 2019-02-13 NOTE — Progress Notes (Signed)
   [employer] - [place] at [facility]   PATIENT NAME: Maxwell Miller    MR#:  [id number]  DATE OF BIRTH:  05-24-1961  No new changes   -Patient continues to be confused intermittently. Not wanting to eat today. Opens eyes to VC  REVIEW OF SYSTEMS:  Review of Systems  Constitutional: Unable to provide due to confusion  DRUG ALLERGIES:  No Known Allergies  VITALS:  Blood pressure 115/83, pulse 70, temperature 98.4 F (36.9 C), temperature source Oral, resp. rate 20, height 5\' 11"  (1.803 m), weight 87.6 kg, SpO2 100 %.  PHYSICAL EXAMINATION:  Physical Exam   GENERAL:  58 y.o.-year-old patient lying in the bed with no acute distress.  EYES: Pupils equal, round, reactive to light and accommodation. No scleral icterus. Extraocular muscles intact.  HEENT: Head atraumatic, normocephalic. Oropharynx and nasopharynx clear.  NECK:  Supple, no jugular venous distention. No thyroid enlargement, no tenderness.  LUNGS: Normal breath sounds bilaterally, no wheezing, rales,rhonchi or crepitation. No use of accessory muscles of respiration.  CARDIOVASCULAR: S1, S2 normal. No murmurs, rubs, or gallops.  ABDOMEN: Soft, non tender  Bowel sounds present. No organomegaly or mass.  EXTREMITIES: No pedal edema, cyanosis, or clubbing.  NEUROLOGIC: Limited due to patient not able to follow all commands  pSYCHIATRIC:patient is sleeping. opens eyes to VC SKIN: No obvious rash, lesion, or ulcer.    LABORATORY PANEL:   CBC Recent Labs  Lab 02/12/19 0306  WBC 8.0  HGB 13.9  HCT 42.5  PLT 238   ------------------------------------------------------------------------------------------------------------------  Chemistries  Recent Labs  Lab 02/12/19 0306  NA 139  K 3.8  CL 106  CO2 26  GLUCOSE 84  BUN 23*  CREATININE 0.94  CALCIUM 8.7*   ------------------------------------------------------------------------------------------------------------------  Cardiac Enzymes  No results for input(s): TROPONINI in the last 168 hours. ------------------------------------------------------------------------------------------------------------------  RADIOLOGY:  No results found.  EKG:   Orders placed or performed during the hospital encounter of 01/20/19  . EKG 12-Lead  . EKG 12-Lead  . ED EKG 12-Lead  . ED EKG 12-Lead    ASSESSMENT AND PLAN:   58 year old male with history of intracranial hemorrhage x2, short-term memory loss and erythrocytosis who presents with generalized weakness, confusion and fever.  1.Sepsis: likely due toCT suggestive of possible proctitis -[name] in the blood on 01/20/19 -finished trt with PO bactrim, per ID  2.  Confusion-short-term memory loss.  Had rapidly progressive dementia.  ?  Underlying vascular dementia as well. 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  No acute findings or no features of meningitis seen. -Seems to be at baseline.  Needs placement -?depressed. Start remeron. Consider psych eval if no improvement  3.Portal vein thrombosis - on eliquis-monitor closely especially given history of intraventricular hemorrhage in the past  4.Generalized weakness-improving. -  Physical therapy recommended home health  5.Hypertension-on Norvasc  Currently no family.  Will need a  guardian APS notified- working on getting a guardian for the patient and then placement-very CHALLENGING  Appreciate social worker consult   CODE STATUS: Full Code  TOTAL TIME TAKING CARE OF THIS PATIENT: 20 minutes.   POSSIBLE D/C IN ? DAYS, DEPENDING ON CLINICAL CONDITION.   Enedina Finner M.D on 02/13/2019 at 12:07 PM  Between 7am to 6pm - Pager - [phone]  After 6pm go to [email or web address] - Social research officer, government  [facility] [place] [facility]  Office  [phone]  CC: Primary care physician; Mable Paris, PA-C

## 2019-02-14 NOTE — Progress Notes (Signed)
   [employer] -  at [facility]   PATIENT NAME: Maxwell Miller    MR#:  [id number]  DATE OF BIRTH:  [birth year]  No new changes   -Patient awake and back to baseline Ate some BF  REVIEW OF SYSTEMS:  Review of Systems  Constitutional: Unable to provide due to confusion  DRUG ALLERGIES:  No Known Allergies  VITALS:  Blood pressure 114/87, pulse 67, temperature 98.2 F (36.8 C), resp. rate 17, height 5\' 11"  (1.803 m), weight 87.6 kg, SpO2 100 %.  PHYSICAL EXAMINATION:  Physical Exam   GENERAL:  58 y.o.-year-old patient lying in the bed with no acute distress.  EYES: Pupils equal, round, reactive to light and accommodation. No scleral icterus. Extraocular muscles intact.  HEENT: Head atraumatic, normocephalic. Oropharynx and nasopharynx clear.  NECK:  Supple, no jugular venous distention. No thyroid enlargement, no tenderness.  LUNGS: Normal breath sounds bilaterally, no wheezing, rales,rhonchi or crepitation. No use of accessory muscles of respiration.  CARDIOVASCULAR: S1, S2 normal. No murmurs, rubs, or gallops.  ABDOMEN: Soft, non tender  Bowel sounds present. No organomegaly or mass.  EXTREMITIES: No pedal edema, cyanosis, or clubbing.  NEUROLOGIC: Limited due to patient not able to follow all commands  pSYCHIATRIC:patient is awake and alert SKIN: No obvious rash, lesion, or ulcer.    LABORATORY PANEL:   CBC Recent Labs  Lab 02/12/19 0306  WBC 8.0  HGB 13.9  HCT 42.5  PLT 238   ------------------------------------------------------------------------------------------------------------------  Chemistries  Recent Labs  Lab 02/12/19 0306  NA 139  K 3.8  CL 106  CO2 26  GLUCOSE 84  BUN 23*  CREATININE 0.94  CALCIUM 8.7*   ------------------------------------------------------------------------------------------------------------------  Cardiac Enzymes No results for input(s): TROPONINI in the last 168 hours.  ------------------------------------------------------------------------------------------------------------------  RADIOLOGY:  No results found.  EKG:   Orders placed or performed during the hospital encounter of 01/20/19  . EKG 12-Lead  . EKG 12-Lead  . ED EKG 12-Lead  . ED EKG 12-Lead    ASSESSMENT AND PLAN:   58 year old male with history of intracranial hemorrhage x2, short-term memory loss and erythrocytosis who presents with generalized weakness, confusion and fever.  1.Sepsis: likely due toCT suggestive of possible proctitis -[name] in the blood on 01/20/19 -finished trt with PO bactrim, per ID  2. Confusion-short-term memory loss.  Had rapidly progressive dementia.  ?  Underlying vascular dementia as well. 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  No acute findings or no features of meningitis seen. -Seems to be at baseline.  Needs placement -?depressed. Start remeron. Consider psych eval if no improvement  3.Portal vein thrombosis - on eliquis-monitor closely especially given history of intraventricular hemorrhage in the past  4.Generalized weakness-improving. -  Physical therapy recommended home health  5.Hypertension-on Norvasc  Currently no family.  Will need a  guardian APS notified- working on getting a guardian for the patient and then placement-very CHALLENGING  Appreciate social worker consult   CODE STATUS: Full Code  TOTAL TIME TAKING CARE OF THIS PATIENT: 20 minutes.   POSSIBLE D/C IN ? DAYS, DEPENDING ON CLINICAL CONDITION.   Enedina Finner M.D on 02/14/2019 at 11:45 AM  Between 7am to 6pm - Pager - [phone]  After 6pm go to [email or web address] - Social research officer, government  [facility] [place] [facility]  Office  [phone]  CC: Primary care physician; Mable Paris, PA-C

## 2019-02-14 NOTE — Progress Notes (Signed)
PT Cancellation Note  Patient Details Name: Maxwell Miller MRN: [id number] DOB: Sep 29, 1961   Cancelled Treatment:    Reason Eval/Treat Not Completed: Fatigue/lethargy limiting ability to participate;Patient declined, no reason specified Patient very lethargic and refused PT multiple times for any therapeutic intervention. Will check back later   [facility] 02/14/2019, 10:21 AM

## 2019-02-14 NOTE — Plan of Care (Signed)
  Problem: Skin Integrity: Goal: Risk for impaired skin integrity will decrease Outcome: Progressing   Problem: Activity: Goal: Risk for activity intolerance will decrease Outcome: Progressing   Problem: Safety: Goal: Ability to remain free from injury will improve Outcome: Progressing   Problem: Skin Integrity: Goal: Risk for impaired skin integrity will decrease Outcome: Progressing   

## 2019-02-15 LAB — CBC
HCT: 43.5 % (ref 39.0–52.0)
Hemoglobin: 14.3 g/dL (ref 13.0–17.0)
MCH: 28.7 pg (ref 26.0–34.0)
MCHC: 32.9 g/dL (ref 30.0–36.0)
MCV: 87.3 fL (ref 80.0–100.0)
Platelets: 199 10*3/uL (ref 150–400)
RBC: 4.98 MIL/uL (ref 4.22–5.81)
RDW: 13.4 % (ref 11.5–15.5)
WBC: 7.3 10*3/uL (ref 4.0–10.5)
nRBC: 0 % (ref 0.0–0.2)

## 2019-02-15 LAB — CREATININE, SERUM
Creatinine, Ser: 0.87 mg/dL (ref 0.61–1.24)
GFR calc Af Amer: >60 mL/min (ref >60)
GFR calc non Af Amer: >60 mL/min (ref >60)

## 2019-02-15 NOTE — Plan of Care (Signed)
  Problem: Skin Integrity: Goal: Risk for impaired skin integrity will decrease Outcome: Progressing   Problem: Activity: Goal: Risk for activity intolerance will decrease Outcome: Progressing   Problem: Safety: Goal: Ability to remain free from injury will improve Outcome: Progressing   Problem: Skin Integrity: Goal: Risk for impaired skin integrity will decrease Outcome: Progressing   

## 2019-02-15 NOTE — Progress Notes (Signed)
Physical Therapy Treatment Patient Details Name: Maxwell Miller MRN: [id number] DOB: Nov 28, 1960 Today's Date: 02/15/2019    History of Present Illness 58 yo male with onset of AMS wtih confusion and fever, sepsis was admitted and noted encephalopathy, has recent B thalamic hemorrhages.  PMHx:  portal vein thrombosis, CVA, HTN, erythrocytosis.  Pt had walked as far as 500 ft with PT this hosptialization, but has shown consistent decline in phyiscal and mental status.    PT Comments    Patient reports he cannot move his body, but it does move uncontrollably at time. PT assisted patient in supine to sit EOB modA and pt is maxA to remain EOB with moments of volitionally holding himself up. Patient is unable to volitionally move LE, but demonstrates full PROM. PT spent time discussing brain-body connection, and attempted to encourage patient to improve his mental state as well. Following discussion patient asked for his blinds to be open and light to be let in his room, and remarked he was going to try to eat better today, which is an improvement. PT will continue to encourage patient. Would benefit from skilled PT to address above deficits and promote optimal return to PLOF    Follow Up Recommendations  SNF;Supervision/Assistance - 24 hour     Equipment Recommendations  None recommended by PT    Recommendations for Other Services       Precautions / Restrictions Precautions Precautions: Fall Restrictions Weight Bearing Restrictions: No    Mobility  Bed Mobility Overal bed mobility: Needs Assistance Bed Mobility: Supine to Sit     Supine to sit: Mod assist Sit to supine: Max assist   General bed mobility comments: Mod assist to sit EOB; max to remain seated  Transfers Overall transfer level: Needs assistance Equipment used: None                Ambulation/Gait                 Stairs             Wheelchair Mobility    Modified Rankin (Stroke Patients  Only)       Balance Overall balance assessment: Needs assistance Sitting-balance support: Feet supported;Bilateral upper extremity supported Sitting balance-Leahy Scale: Poor Sitting balance - Comments: Patient requires interrmitent UE support to perform sitting EOB performed x 6 min Postural control: Posterior lean Standing balance support: Bilateral upper extremity supported Standing balance-Leahy Scale: Poor Standing balance comment: Patient required modA-maxA to perform prolonged standing without therapist support perpformed 40sec      Tandem Stance - Right Leg: 10 Tandem Stance - Left Leg: 10 Rhomberg - Eyes Opened: 30 Rhomberg - Eyes Closed: 30                Cognition                                              Exercises Other Exercises Other Exercises: Patient is willing to work with PT. Patient is modA from supine > sit EOB. Patient tolerated of EOB sitting with patient able to hold himself up after PT placed his hands on bed on and off, but otherwise was mod-maxA to remain EOB. PT attempted to encourage patient to volitionally move LEs, which he is unable to do, but patient is able to demonstrate full ROM with PROM from PT. PT discussed brain-body  connection with patietn at length and the power of mental health's relation to physical health. Patient agreed to eat more today, and lifted his blinds in his room and have his lights on to attempt to create a more positive environment.    General Comments        Pertinent Vitals/Pain Pain Location: Pt with very little interaction, no obvious signs of pain Pain Descriptors / Indicators: Discomfort    Home Living Family/patient expects to be discharged to:: Unsure Living Arrangements: Other (Comment)                  Prior Function            PT Goals (current goals can now be found in the care plan section) Acute Rehab PT Goals Patient Stated Goal: to walk with PT  PT Goal  Formulation: Patient unable to participate in goal setting Time For Goal Achievement: 02/27/19 Potential to Achieve Goals: Fair    Frequency    Min 2X/week      PT Plan      Co-evaluation              AM-PAC PT "6 Clicks" Mobility   Outcome Measure  Help needed turning from your back to your side while in a flat bed without using bedrails?: Total Help needed moving from lying on your back to sitting on the side of a flat bed without using bedrails?: Total Help needed moving to and from a bed to a chair (including a wheelchair)?: Total Help needed standing up from a chair using your arms (e.g., wheelchair or bedside chair)?: Total Help needed to walk in hospital room?: Total Help needed climbing 3-5 steps with a railing? : Total 6 Click Score: 6    End of Session Equipment Utilized During Treatment: Gait belt Activity Tolerance: Patient limited by lethargy;Treatment limited secondary to medical complications (Comment) Patient left: with bed alarm set;with call bell/phone within reach;in bed Nurse Communication: Mobility status PT Visit Diagnosis: Unsteadiness on feet (R26.81);Other abnormalities of gait and mobility (R26.89);Adult, failure to thrive (R62.7)     Time: 4235-3614 PT Time Calculation (min) (ACUTE ONLY): 18 min  Charges:  $Therapeutic Activity: 8-22 mins                    Staci Acosta PT, DPT   Staci Acosta 02/15/2019, 10:17 AM

## 2019-02-15 NOTE — Progress Notes (Signed)
[employer] - [place] at [facility]   PATIENT NAME: Maxwell Miller    MR#:  [id number]  DATE OF BIRTH:  [birth year]  No new changes   -Patient awake and back to baseline Ate some BF  REVIEW OF SYSTEMS:  Review of Systems  Constitutional: Unable to provide due to confusion  DRUG ALLERGIES:  No Known Allergies  VITALS:  Blood pressure (!) 141/88, pulse 68, temperature 98.6 F (37 C), resp. rate 17, height 5\' 11"  (1.803 m), weight 87.6 kg, SpO2 98 %.  PHYSICAL EXAMINATION:  Physical Exam   GENERAL:  58 y.o.-year-old patient lying in the bed with no acute distress.  EYES: Pupils equal, round, reactive to light and accommodation. No scleral icterus. Extraocular muscles intact.  HEENT: Head atraumatic, normocephalic. Oropharynx and nasopharynx clear.  NECK:  Supple, no jugular venous distention. No thyroid enlargement, no tenderness.  LUNGS: Normal breath sounds bilaterally, no wheezing, rales,rhonchi or crepitation. No use of accessory muscles of respiration.  CARDIOVASCULAR: S1, S2 normal. No murmurs, rubs, or gallops.  ABDOMEN: Soft, non tender  Bowel sounds present. No organomegaly or mass.  EXTREMITIES: No pedal edema, cyanosis, or clubbing.  NEUROLOGIC: Limited due to patient not able to follow all commands  pSYCHIATRIC:patient is awake and alert SKIN: No obvious rash, lesion, or ulcer.    LABORATORY PANEL:   CBC Recent Labs  Lab 02/15/19 0405  WBC 7.3  HGB 14.3  HCT 43.5  PLT 199   ------------------------------------------------------------------------------------------------------------------  Chemistries  Recent Labs  Lab 02/12/19 0306 02/15/19 0405  NA 139  --   K 3.8  --   CL 106  --   CO2 26  --   GLUCOSE 84  --   BUN 23*  --   CREATININE 0.94 0.87  CALCIUM 8.7*  --    ------------------------------------------------------------------------------------------------------------------  Cardiac Enzymes No results for  input(s): TROPONINI in the last 168 hours. ------------------------------------------------------------------------------------------------------------------  RADIOLOGY:  No results found.  EKG:   Orders placed or performed during the hospital encounter of 01/20/19  . EKG 12-Lead  . EKG 12-Lead  . ED EKG 12-Lead  . ED EKG 12-Lead    ASSESSMENT AND PLAN:   58 year old male with history of intracranial hemorrhage x2, short-term memory loss and erythrocytosis who presents with generalized weakness, confusion and fever.  1.Sepsis: likely due toCT suggestive of possible proctitis -[name] in the blood on 01/20/19 -finished trt with PO bactrim, per ID  2. Confusion-short-term memory loss.  Had rapidly progressive dementia.  ?  Underlying vascular dementia as well. 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  No acute findings or no features of meningitis seen. -Seems to be at baseline.  Needs placement -?depressed. Start remeron. Consider psych eval if no improvement  3.Portal vein thrombosis - on eliquis-monitor closely especially given history of intraventricular hemorrhage in the past  4.Generalized weakness-improving. -  Physical therapy recommended home health  5.Hypertension-on Norvasc  Currently no family.  Will need a  guardian APS notified- working on getting a guardian for the patient and then placement-very CHALLENGING  Appreciate social worker consult   CODE STATUS: Full Code  TOTAL TIME TAKING CARE OF THIS PATIENT: 20 minutes.   POSSIBLE D/C IN ? DAYS, DEPENDING ON CLINICAL CONDITION.   Enedina Finner M.D on 02/15/2019 at 11:45 AM  Between 7am to 6pm - Pager - [phone]  After 6pm go to [email or web address] - password [email or web address]  [employer]  [phone]  CC: Primary care physician; Mable Paris, PA-C

## 2019-02-16 LAB — GLUCOSE, CAPILLARY: Glucose-Capillary: 96 mg/dL (ref 70–99)

## 2019-02-16 NOTE — Progress Notes (Signed)
   02/16/19 1300  Clinical Encounter Type  Visited With Patient  Visit Type Follow-up  Referral From Nurse   The chaplain received a referral to visit this patient from the Charge Nurse. Upon arrival, the patient was awake, alert, and watching television. He was pleasant and oriented to self only. He shared that he was doing better since his arrival and that he would love to go home. The chaplain provided pastoral care in the form of a listening ear and engaged the patient in conversation about some of the existential questions he's been wrestling with during his hospital stay. The patient expressed gratitude for the visit and would appreciate follow-up conversations.

## 2019-02-16 NOTE — Progress Notes (Signed)
Physical Therapy Treatment Patient Details Name: Maxwell Miller MRN: [id number] DOB: [birth year] Today's Date: 02/16/2019    History of Present Illness 58 yo male with onset of AMS wtih confusion and fever, sepsis was admitted and noted encephalopathy, has recent B thalamic hemorrhages.  PMHx:  portal vein thrombosis, CVA, HTN, erythrocytosis.  Pt had walked as far as 500 ft with PT this hosptialization, but has shown consistent decline in phyiscal and mental status.    PT Comments    Upon PT arrival, pt with flat affect and reporting difficulty with initiating movement (although pt noted to be able to move B UE's actively and wiggling B feet upon PT entry but then pt stopped when therapist introduced herself).  Attempted semi-supine to sit but unable to do with 1 assist (no initiation noted with max vc's and tactile cues and extra time) but able to sit up with 2 assist.  Sitting balance mod assist d/t posterior lean (pt appearing to push backwards at times); attempted to have pt assist with B UE's to maintain sitting balance (pt briefly held onto siderail on L side when hand placed on siderail) but mostly pt not initiating to assist.  2 assist to lay pt down in bed (pt noted to assist a little with LE's to bring onto edge of bed) and 2 assist to boost pt up in bed.  Will continue to attempt to progress pt with strengthening, balance, and progressive functional mobility.   Follow Up Recommendations  SNF     Equipment Recommendations  Wheelchair (measurements PT);Wheelchair cushion (measurements PT);3in1 (PT);Other (comment)(hoyer lift)    Recommendations for Other Services       Precautions / Restrictions Precautions Precautions: Fall Restrictions Weight Bearing Restrictions: No    Mobility  Bed Mobility Overal bed mobility: Needs Assistance Bed Mobility: Supine to Sit;Sit to Supine     Supine to sit: +2 for physical assistance Sit to supine: +2 for physical assistance   General  bed mobility comments: assist for trunk and B LE's  Transfers                 General transfer comment: Deferred d/t assist required for sitting balance  Ambulation/Gait                 Stairs             Wheelchair Mobility    Modified Rankin (Stroke Patients Only)       Balance Overall balance assessment: Needs assistance Sitting-balance support: Feet supported;Bilateral upper extremity supported Sitting balance-Leahy Scale: Poor Sitting balance - Comments: mod assist sitting balance x10 minutes Postural control: Posterior lean                                  Cognition Arousal/Alertness: Awake/alert Behavior During Therapy: Flat affect Overall Cognitive Status: Impaired/Different from baseline                                 General Comments: Per chart pt appears to have had a decline in cognition over time during hospitalization      Exercises      General Comments   Nursing cleared pt for participation in physical therapy.  Pt agreeable to PT session.      Pertinent Vitals/Pain Pain Assessment: Faces Faces Pain Scale: No hurt Pain Intervention(s): Limited activity within patient's tolerance;Monitored during  session;Repositioned    Home Living                      Prior Function            PT Goals (current goals can now be found in the care plan section) Acute Rehab PT Goals Patient Stated Goal: to walk with PT  PT Goal Formulation: With patient Time For Goal Achievement: 02/27/19 Potential to Achieve Goals: Fair Progress towards PT goals: Not progressing toward goals - comment(difficulty initiating movement)    Frequency    Min 2X/week      PT Plan Current plan remains appropriate    Co-evaluation              AM-PAC PT "6 Clicks" Mobility   Outcome Measure  Help needed turning from your back to your side while in a flat bed without using bedrails?: Total Help needed  moving from lying on your back to sitting on the side of a flat bed without using bedrails?: Total Help needed moving to and from a bed to a chair (including a wheelchair)?: Total Help needed standing up from a chair using your arms (e.g., wheelchair or bedside chair)?: Total Help needed to walk in hospital room?: Total Help needed climbing 3-5 steps with a railing? : Total 6 Click Score: 6    End of Session   Activity Tolerance: Patient tolerated treatment well Patient left: in bed;with call bell/phone within reach;with bed alarm set Nurse Communication: Mobility status;Precautions PT Visit Diagnosis: Unsteadiness on feet (R26.81);Other abnormalities of gait and mobility (R26.89);Adult, failure to thrive (R62.7)     Time: 1005-1030 PT Time Calculation (min) (ACUTE ONLY): 25 min  Charges:  $Therapeutic Activity: 23-37 mins                    Hendricks Limes, PT 02/16/19, 11:17 AM [phone]

## 2019-02-16 NOTE — Plan of Care (Signed)
  Problem: Skin Integrity: Goal: Risk for impaired skin integrity will decrease Outcome: Progressing   Problem: Activity: Goal: Risk for activity intolerance will decrease Outcome: Progressing   Problem: Safety: Goal: Ability to remain free from injury will improve Outcome: Progressing   Problem: Skin Integrity: Goal: Risk for impaired skin integrity will decrease Outcome: Progressing   

## 2019-02-16 NOTE — Progress Notes (Signed)
Pt c/o dizziness following attempt to pass stool in bathroom. Vitals are stable znd trend consistently. Pt states he dis bear down when attempting to use the bathroom. Pt is resting safe in bed with bed alarm on will continue to monitor

## 2019-02-16 NOTE — Progress Notes (Signed)
   02/16/19 1000  Clinical Encounter Type  Visited With Patient not available;Health care provider  Visit Type Follow-up  Referral From Nurse   Chaplain received a referral from the Charge Nurse to visit this patient because he seemed to be feeling down. Patient in with physical therapy at this time. Will follow-up at a later time.

## 2019-02-16 NOTE — Progress Notes (Signed)
   [facility] [place] at [facility]   PATIENT NAME: Maxwell Miller    MR#:  [id number]  DATE OF BIRTH:  1961-03-23  No new changes   Awake and confused  REVIEW OF SYSTEMS:  Review of Systems  Constitutional: Unable to provide due to confusion  DRUG ALLERGIES:  No Known Allergies  VITALS:  Blood pressure (!) 122/92, pulse 73, temperature 97.7 F (36.5 C), temperature source Oral, resp. rate 18, height 5\' 11"  (1.803 m), weight 87.6 kg, SpO2 98 %.  PHYSICAL EXAMINATION:  Physical Exam   GENERAL:  58 y.o.-year-old patient lying in the bed with no acute distress.  EYES: Pupils equal, round, reactive to light and accommodation. No scleral icterus. Extraocular muscles intact.  HEENT: Head atraumatic, normocephalic. Oropharynx and nasopharynx clear.  NECK:  Supple, no jugular venous distention. No thyroid enlargement, no tenderness.  LUNGS: Normal breath sounds bilaterally, no wheezing, rales,rhonchi or crepitation. No use of accessory muscles of respiration.  CARDIOVASCULAR: S1, S2 normal. No murmurs, rubs, or gallops.  ABDOMEN: Soft, non tender  Bowel sounds present. No organomegaly or mass.  EXTREMITIES: No pedal edema, cyanosis, or clubbing.  NEUROLOGIC: Limited due to patient not able to follow all commands  pSYCHIATRIC:patient is awake and alert SKIN: No obvious rash, lesion, or ulcer.    LABORATORY PANEL:   CBC Recent Labs  Lab 02/15/19 0405  WBC 7.3  HGB 14.3  HCT 43.5  PLT 199   ------------------------------------------------------------------------------------------------------------------  Chemistries  Recent Labs  Lab 02/12/19 0306 02/15/19 0405  NA 139  --   K 3.8  --   CL 106  --   CO2 26  --   GLUCOSE 84  --   BUN 23*  --   CREATININE 0.94 0.87  CALCIUM 8.7*  --    ------------------------------------------------------------------------------------------------------------------  Cardiac Enzymes No results for input(s):  TROPONINI in the last 168 hours. ------------------------------------------------------------------------------------------------------------------  RADIOLOGY:  No results found.  EKG:   Orders placed or performed during the hospital encounter of 01/20/19  . EKG 12-Lead  . EKG 12-Lead  . ED EKG 12-Lead  . ED EKG 12-Lead    ASSESSMENT AND PLAN:   58 year old male with history of intracranial hemorrhage x2, short-term memory loss and erythrocytosis who presents with generalized weakness, confusion and fever.  1.Sepsis: likely due toCT suggestive of possible proctitis -[name] in the blood on 01/20/19 -finished trt with PO bactrim, per ID  2. Confusion-short-term memory loss.  Had rapidly progressive dementia.  ?  Underlying vascular dementia as well. 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  No acute findings or no features of meningitis seen. -Seems to be at baseline.  Needs placement -?depressed. Start remeron. Consider psych eval if no improvement  3.Portal vein thrombosis - on eliquis-monitor closely especially given history of intraventricular hemorrhage in the past  4.Generalized weakness-improving. -  Physical therapy recommended home health  5.Hypertension-on Norvasc  Currently no family.  Will need a  guardian APS notified- working on getting a guardian for the patient and then placement  Appreciate social worker input   CODE STATUS: Full Code  TOTAL TIME TAKING CARE OF THIS PATIENT: 20 minutes.    Molinda Bailiff Koy Lamp M.D on 02/16/2019 at 12:09 PM  Between 7am to 6pm - Pager - [phone]  After 6pm go to [email or web address] - Social research officer, government  [facility] [place]  Office  [phone]  CC: Primary care physician; Mable Paris, PA-C

## 2019-02-17 NOTE — Progress Notes (Signed)
Physical Therapy Treatment Patient Details Name: Maxwell Miller MRN: [id number] DOB: 06-13-1961 Today's Date: 02/17/2019    History of Present Illness 58 yo male with onset of AMS wtih confusion and fever, sepsis was admitted and noted encephalopathy, has recent B thalamic hemorrhages.  PMHx:  portal vein thrombosis, CVA, HTN, erythrocytosis.  Pt had walked as far as 500 ft with PT this hosptialization, but has shown consistent decline in phyiscal and mental status.    PT Comments    Pt talkative this morning.  Per chart review and discussion with pt's nurse, pt appears to have walked to bathroom earlier this morning with 2 assist (pt reports he does not remember doing that though).  Able to sit pt on edge of bed with max assist and then pt able to sit on edge of bed with close SBA for safety.  Then pt stood with max assist up to RW (posterior lean noted requiring min to mod assist to maintain upright balance); then performed standing weight shifts (pt unable to take any steps with cueing) but then pt reporting significant dizziness.  Pt assisted into sitting (pt reporting symptoms did not improve) and then assisted to laying in bed (BP 138/86).  Pt initially reporting 8/10 dizziness laying in bed; nurse notified immediately (pt reporting dizziness symptoms increased going from supine to sit to stand although did not mention this until he was standing) and came to check on pt and pt then reporting dizziness symptoms improved to 4/10.  Improved initiation noted today (UE's >LE's).  Will continue to focus on strengthening and progressive functional mobility per pt tolerance.  Pt may also benefit from assessing orthostatic VS.   Follow Up Recommendations  SNF     Equipment Recommendations  Wheelchair (measurements PT);Wheelchair cushion (measurements PT);3in1 (PT)    Recommendations for Other Services       Precautions / Restrictions Precautions Precautions: Fall Restrictions Weight Bearing  Restrictions: No    Mobility  Bed Mobility Overal bed mobility: Needs Assistance Bed Mobility: Supine to Sit;Sit to Supine     Supine to sit: Max assist;HOB elevated Sit to supine: Max assist;HOB elevated   General bed mobility comments: assist for LE's and trunk (pt able to assist a little with UE's); 2 assist to boost pt up in bed  Transfers Overall transfer level: Needs assistance Equipment used: Rolling walker (2 wheeled) Transfers: Sit to/from Stand Sit to Stand: Max assist         General transfer comment: assist to initiate and come to full stand; assist to control descent sitting  Ambulation/Gait Ambulation/Gait assistance: Max assist Gait Distance (Feet): 0 Feet Assistive device: Rolling walker (2 wheeled)       General Gait Details: assisted weight shift R/L x5 reps B (pt then reporting significant dizziness and assisted into sitting)   Stairs             Wheelchair Mobility    Modified Rankin (Stroke Patients Only)       Balance Overall balance assessment: Needs assistance Sitting-balance support: Bilateral upper extremity supported;Feet supported Sitting balance-Leahy Scale: Fair Sitting balance - Comments: steady static sitting with B UE support   Standing balance support: Bilateral upper extremity supported Standing balance-Leahy Scale: Poor Standing balance comment: posterior lean noted in standing requiring min to mod assist to maintain upright balance with B UE support on RW  Cognition Arousal/Alertness: Awake/alert Behavior During Therapy: Flat affect Overall Cognitive Status: No family/caregiver present to determine baseline cognitive functioning                                 General Comments: Per chart pt appears to have had a decline in cognition over time during hospitalization      Exercises      General Comments   Nursing cleared pt for participation in physical  therapy.  Pt agreeable to PT session.      Pertinent Vitals/Pain Pain Assessment: Faces Faces Pain Scale: No hurt Pain Intervention(s): Limited activity within patient's tolerance;Monitored during session;Repositioned  HR WFL during session    Home Living                      Prior Function            PT Goals (current goals can now be found in the care plan section) Acute Rehab PT Goals Patient Stated Goal: to walk with PT  PT Goal Formulation: With patient Time For Goal Achievement: 02/27/19 Potential to Achieve Goals: Fair Progress towards PT goals: Progressing toward goals    Frequency    Min 2X/week      PT Plan Current plan remains appropriate    Co-evaluation              AM-PAC PT "6 Clicks" Mobility   Outcome Measure  Help needed turning from your back to your side while in a flat bed without using bedrails?: A Lot Help needed moving from lying on your back to sitting on the side of a flat bed without using bedrails?: A Lot Help needed moving to and from a bed to a chair (including a wheelchair)?: Total Help needed standing up from a chair using your arms (e.g., wheelchair or bedside chair)?: Total Help needed to walk in hospital room?: Total Help needed climbing 3-5 steps with a railing? : Total 6 Click Score: 8    End of Session Equipment Utilized During Treatment: Gait belt Activity Tolerance: Other (comment)(Limited d/t c/o dizziness) Patient left: in bed;with call bell/phone within reach;with bed alarm set;Other (comment)(B heels elevated via pillow) Nurse Communication: Mobility status;Precautions PT Visit Diagnosis: Unsteadiness on feet (R26.81);Other abnormalities of gait and mobility (R26.89);Adult, failure to thrive (R62.7)     Time: 0921-0946 PT Time Calculation (min) (ACUTE ONLY): 25 min  Charges:  $Therapeutic Exercise: 8-22 mins $Therapeutic Activity: 8-22 mins                    Hendricks LimesEmily Emmanual Gauthreaux, PT 02/17/19, 10:22  AM [phone]

## 2019-02-17 NOTE — Progress Notes (Signed)
Attempted to perform vs's l/s/s but pt  Would not cooperate. Was only able to get lying vs.

## 2019-02-17 NOTE — Progress Notes (Signed)
   02/17/19 1100  Clinical Encounter Type  Visited With Patient  Visit Type Follow-up  Spiritual Encounters  Spiritual Needs Sacred text;Emotional  Ch talked with the patient about God's mercy. Pt shared his spiritual insight with the ch. Pt enjoys talking about faith and spirituality.

## 2019-02-17 NOTE — Progress Notes (Signed)
[facility] - [place] at [facility]   PATIENT NAME: Maxwell Miller    MR#:  [id number]  DATE OF BIRTH:  08-30-1961  No new changes   Awake and confused.  Oriented to self.  He was dizzy earlier when he woke with therapy.  REVIEW OF SYSTEMS:  Review of Systems  Constitutional: Unable to provide due to confusion  DRUG ALLERGIES:  No Known Allergies  VITALS:  Blood pressure 131/86, pulse 76, temperature 98.1 F (36.7 C), temperature source Oral, resp. rate 18, height 5\' 11"  (1.803 m), weight 87.6 kg, SpO2 100 %.  PHYSICAL EXAMINATION:  Physical Exam   GENERAL:  58 y.o.-year-old patient lying in the bed with no acute distress.  EYES: Pupils equal, round, reactive to light and accommodation. No scleral icterus. Extraocular muscles intact.  HEENT: Head atraumatic, normocephalic. Oropharynx and nasopharynx clear.  NECK:  Supple, no jugular venous distention. No thyroid enlargement, no tenderness.  LUNGS: Normal breath sounds bilaterally, no wheezing, rales,rhonchi or crepitation. No use of accessory muscles of respiration.  CARDIOVASCULAR: S1, S2 normal. No murmurs, rubs, or gallops.  ABDOMEN: Soft, non tender  Bowel sounds present. No organomegaly or mass.  EXTREMITIES: No pedal edema, cyanosis, or clubbing.  NEUROLOGIC: Motor strength 5/5 in upper and lower extremities pSYCHIATRIC:patient is awake and alert SKIN: No obvious rash, lesion, or ulcer.    LABORATORY PANEL:   CBC Recent Labs  Lab 02/15/19 0405  WBC 7.3  HGB 14.3  HCT 43.5  PLT 199   ------------------------------------------------------------------------------------------------------------------  Chemistries  Recent Labs  Lab 02/12/19 0306 02/15/19 0405  NA 139  --   K 3.8  --   CL 106  --   CO2 26  --   GLUCOSE 84  --   BUN 23*  --   CREATININE 0.94 0.87  CALCIUM 8.7*  --     ------------------------------------------------------------------------------------------------------------------  Cardiac Enzymes No results for input(s): TROPONINI in the last 168 hours. ------------------------------------------------------------------------------------------------------------------  RADIOLOGY:  No results found.  EKG:   Orders placed or performed during the hospital encounter of 01/20/19  . EKG 12-Lead  . EKG 12-Lead  . ED EKG 12-Lead  . ED EKG 12-Lead    ASSESSMENT AND PLAN:   58 year old male with history of intracranial hemorrhage x2, short-term memory loss and erythrocytosis who presents with generalized weakness, confusion and fever.  1.Sepsis: likely due toCT suggestive of possible proctitis -[name] in the blood on 01/20/19 -finished trt with PO bactrim, per ID -Resolved  2. Confusion-short-term memory loss.  Had rapidly progressive dementia.  ?  Underlying vascular dementia as well. 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  No acute findings or no features of meningitis seen. -Seems to be at baseline.  Needs placement -?depressed. Started remeron  3.Portal vein thrombosis - on eliquis-monitor closely especially given history of intraventricular hemorrhage in the past  4.Generalized weakness-improving. -  Physical therapy recommended home health  5.Hypertension-on Norvasc  Currently no family.  Will need a  guardian APS notified- working on getting a guardian for the patient and then placement  Appreciate social worker input   CODE STATUS: Full Code  TOTAL TIME TAKING CARE OF THIS PATIENT: 20 minutes.    Molinda BailiffSrikar R Jannette Cotham M.D on 02/17/2019 at 10:46 AM  Between 7am to 6pm - Pager - [phone]  After 6pm go to [email or web address] - Social research officer, governmentpassword EPAS [facility]  [facility] [place] [facility]  Office  [phone]  CC: Primary care physician; Mayford KnifeWilliams,  Katrina, PA-C

## 2019-02-18 NOTE — Progress Notes (Signed)
OT Cancellation Note  Patient Details Name: Maxwell Miller MRN: [id number] DOB: 1961-02-24   Cancelled Treatment:    Reason Eval/Treat Not Completed: Patient's level of consciousness;Patient declined, no reason specified. Order received. Chart reviewed. Per PT this patient has declined PT services x3 on this date. Upon arrival to room pt alseep supine in bed. Pt did not rouse to verbal cues. Pt opened eyes briefly with tactile cue, but could not maintain alert state. OT attempted to introduce self and describe reason for OT evaluation, but pt remained lethargic. Will re-attempt at a later time/date as available and pt medically appropriate for OT tx.   Rockney Ghee, M.S., OTR/L Ascom: [phone] 02/18/19, 10:38 AM

## 2019-02-18 NOTE — Progress Notes (Signed)
   [facility] - [place] at [facility]   PATIENT NAME: Maxwell Miller    MR#:  [id number]  DATE OF BIRTH:  1961-06-13  No new changes   Awake and alert.  Complains of poor appetite.  Dizziness is better  REVIEW OF SYSTEMS:  Review of Systems  Constitutional: Unable to provide due to confusion  DRUG ALLERGIES:  No Known Allergies  VITALS:  Blood pressure 127/86, pulse 63, temperature 98.4 F (36.9 C), temperature source Oral, resp. rate 18, height 5\' 11"  (1.803 m), weight 87.6 kg, SpO2 97 %.  PHYSICAL EXAMINATION:  Physical Exam   GENERAL:  58 y.o.-year-old patient lying in the bed with no acute distress.  EYES: Pupils equal, round, reactive to light and accommodation. No scleral icterus. Extraocular muscles intact.  HEENT: Head atraumatic, normocephalic. Oropharynx and nasopharynx clear.  NECK:  Supple, no jugular venous distention. No thyroid enlargement, no tenderness.  LUNGS: Normal breath sounds bilaterally, no wheezing, rales,rhonchi or crepitation. No use of accessory muscles of respiration.  CARDIOVASCULAR: S1, S2 normal. No murmurs, rubs, or gallops.  ABDOMEN: Soft, non tender  Bowel sounds present. No organomegaly or mass.  EXTREMITIES: No pedal edema, cyanosis, or clubbing.  NEUROLOGIC: Motor strength 5/5 in upper and lower extremities pSYCHIATRIC:patient is awake and alert SKIN: No obvious rash, lesion, or ulcer.    LABORATORY PANEL:   CBC Recent Labs  Lab 02/15/19 0405  WBC 7.3  HGB 14.3  HCT 43.5  PLT 199   ------------------------------------------------------------------------------------------------------------------  Chemistries  Recent Labs  Lab 02/12/19 0306 02/15/19 0405  NA 139  --   K 3.8  --   CL 106  --   CO2 26  --   GLUCOSE 84  --   BUN 23*  --   CREATININE 0.94 0.87  CALCIUM 8.7*  --    ------------------------------------------------------------------------------------------------------------------   Cardiac Enzymes No results for input(s): TROPONINI in the last 168 hours. ------------------------------------------------------------------------------------------------------------------  RADIOLOGY:  No results found.  EKG:   Orders placed or performed during the hospital encounter of 01/20/19  . EKG 12-Lead  . EKG 12-Lead  . ED EKG 12-Lead  . ED EKG 12-Lead    ASSESSMENT AND PLAN:   58 year old male with history of intracranial hemorrhage x2, short-term memory loss and erythrocytosis who presents with generalized weakness, confusion and fever.  1.Sepsis: likely due toCT suggestive of possible proctitis -[name] in the blood on 01/20/19 -finished trt with PO bactrim, per ID -Resolved  2. Confusion-short-term memory loss.  Had rapidly progressive dementia.  ?  Underlying vascular dementia as well. 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  No acute findings or no features of meningitis seen. -Seems to be at baseline.  Needs placement -?depressed. Started remeron  3.Portal vein thrombosis - on eliquis-monitor closely especially given history of intraventricular hemorrhage in the past  4.Generalized weakness-improving. -  Physical therapy recommended home health  5.Hypertension-on Norvasc  Currently no family.  Will need a  guardian APS notified- working on getting a guardian for the patient and then placement  Appreciate social worker input   CODE STATUS: Full Code  TOTAL TIME TAKING CARE OF THIS PATIENT: 20 minutes.    Molinda Bailiff Hitoshi Werts M.D on 02/18/2019 at 11:30 AM  Between 7am to 6pm - Pager - [phone]  After 6pm go to [email or web address] - Social research officer, government  [facility] [place] [facility]  Office  [phone]  CC: Primary care physician; Mable Paris, PA-C

## 2019-02-18 NOTE — Progress Notes (Signed)
PT Cancellation Note  Patient Details Name: Maxwell Miller MRN: [id number] DOB: 1961/07/03   Cancelled Treatment:    Reason Eval/Treat Not Completed: Patient declined, no reason specified;Fatigue/lethargy limiting ability to participate Patient unwilling to participate at this time, very tired. Agreeable for PT to return at a later time  Staci Acosta PT, DPT Staci Acosta 02/18/2019, 10:07 AM

## 2019-02-18 NOTE — Progress Notes (Signed)
Physical Therapy Treatment Patient Details Name: Maxwell Miller MRN: [id number] DOB: 01-29-1961 Today's Date: 02/18/2019    History of Present Illness 58 yo male with onset of AMS wtih confusion and fever, sepsis was admitted and noted encephalopathy, has recent B thalamic hemorrhages.  PMHx:  portal vein thrombosis, CVA, HTN, erythrocytosis.  Pt had walked as far as 500 ft with PT this hosptialization, but has shown consistent decline in phyiscal and mental status.    PT Comments    Although patient reports he will attempt to work with PT, he is unable to physically assisted with supine> sit EOB and is maxA to remain EOB. Patient demonstrates poor trunk control as well as poor UE and LE control. PT educated patietn on importance of PT and movement; patient nodded in understanding  Follow Up Recommendations        Equipment Recommendations       Recommendations for Other Services       Precautions / Restrictions Precautions Precautions: Fall Restrictions Weight Bearing Restrictions: No    Mobility  Bed Mobility Overal bed mobility: Needs Assistance Bed Mobility: Supine to Sit;Sit to Supine     Supine to sit: Max assist;HOB elevated Sit to supine: Max assist;HOB elevated   General bed mobility comments: Patient reports he is willing to attempt to sit EOB, offers no physical assistance, does not resist transfer. maxA for transfer and to remain seated EOB  Transfers Overall transfer level: Needs assistance                  Ambulation/Gait                 Stairs             Wheelchair Mobility    Modified Rankin (Stroke Patients Only)       Balance Overall balance assessment: Needs assistance Sitting-balance support: Bilateral upper extremity supported;Feet supported Sitting balance-Leahy Scale: Zero Sitting balance - Comments: Per PT pt with fair sitting balance with B UE support.                                      Cognition Arousal/Alertness: Lethargic Behavior During Therapy: Flat affect Overall Cognitive Status: No family/caregiver present to determine baseline cognitive functioning                                 General Comments: Per chart pt appears to have had a decline in cognition over time during hospitalization.       Exercises Other Exercises Other Exercises: Patient nods in agreeance with PT transferring him EOB but is unable to physically participate with UE or LE. Patietn with poor trunk control to date and is maxA to remain sitting upright. PT assisted patient back to bed as he could not participate, educated patient on importance of PT which patient nods in agreeance Other Exercises: Pt educated on strategies for maintaining regular sleep/wake cycles in including turning on lights/lifting blinds during the day, finding activities to occupy his time during the day, and decreasing environmental stimulation at night in order to decrease risk of delirium and improve functional participation.    General Comments General comments (skin integrity, edema, etc.): Pt with significant memory loss. He spoke at length about tangential subjects including statements such as "people once lived [phone] years" and "Our value is only in  money", but was unable to answer questions about present/prior level of function. Declined to demonstrate or participate in ADL tasks. OT utilized therapeutic use of self and VC's for redirection to support pt participation in OT evaluation, but participation was very limited.       Pertinent Vitals/Pain Pain Assessment: No/denies pain    Home Living Family/patient expects to be discharged to:: Unsure Living Arrangements: Other (Comment)(Pt states he is unable to remember living arragements. )                  Prior Function        Comments: Pt with memory loss. Is unable to recall PLOF, although on this date he states he remembers driving "at  one time". Does not recall ADL/IADLs prior to hospital admission.    PT Goals (current goals can now be found in the care plan section) Acute Rehab PT Goals Patient Stated Goal: To go home and live independently    Frequency           PT Plan      Co-evaluation              AM-PAC PT "6 Clicks" Mobility   Outcome Measure                   End of Session               Time:  -     Charges:                        Staci Acosta PT, DPT   Staci Acosta 02/18/2019, 3:11 PM

## 2019-02-18 NOTE — Evaluation (Signed)
Occupational Therapy Evaluation Patient Details Name: Maxwell Miller MRN: [id number] DOB: 07-26-1961 Today's Date: 02/18/2019    History of Present Illness 58 yo male with onset of AMS wtih confusion and fever, sepsis was admitted and noted encephalopathy, has recent B thalamic hemorrhages.  PMHx:  portal vein thrombosis, CVA, HTN, erythrocytosis.  Pt had walked as far as 500 ft with PT this hosptialization, but has shown consistent decline in phyiscal and mental status.   Clinical Impression   Maxwell Miller was seen for OT evaluation this date. Pt with memory loss, and endorses he is unable to recall information on PLOF or home set-up on this date. Per chart review, pt was living at home with his wife prior to admission, but this status may be changing as pt's wife has indicated she will not assume his care upon hospital DC. Pt appears to have declined in functional mobility and cognitive status since admission. Early PT notes indicate he was walking well and moving all extremities initially after admission, but has since reported he is unable to move UE/LE functionally. Currently pt demonstrates impairments in cognition, memory, strength, endurance, and activity tolerance requiring Max/Total assist for all ADL and mobility other than self-feeding. Pt is self-feeding independently, but declines to participate in other ADL activities at time of evaluation. Pt spoke at length about tangential or unrelated topics and appeared to have difficulty organizing thoughts and following conversation. Pt initially appeared to decline OT services but at end of evaluation stated he would like to continue to work with OT. Pt would benefit from skilled OT to address noted impairments and functional limitations (see below for any additional details) in order to maximize safety and independence while minimizing falls risk and caregiver burden. Upon hospital discharge, recommend pt discharge to STR.     Follow Up  Recommendations  SNF;Supervision/Assistance - 24 hour    Equipment Recommendations  Other (comment)(TBD)    Recommendations for Other Services       Precautions / Restrictions Precautions Precautions: Fall Restrictions Weight Bearing Restrictions: No      Mobility Bed Mobility Overal bed mobility: Needs Assistance             General bed mobility comments: Deferred. Pt declined any physical activity on this date. Per PT he is max/total assist for transfes/mobility.   Transfers Overall transfer level: Needs assistance                    Balance Overall balance assessment: Needs assistance     Sitting balance - Comments: Per PT pt with fair sitting balance with B UE support.                                    ADL either performed or assessed with clinical judgement   ADL Overall ADL's : Needs assistance/impaired                                       General ADL Comments: Per chart, pt has shown a significant decline in functional independence and ADL since hospital admission. Pt declined any OOB/EOB activity on this date. Was noted to move arms and legs in bed while supine, but movement limited. Suspect max/total assist for dressing, bathing, toileting. Pt is, however, independent with self feeding when given meal tray in room. Nursing notes  indicate pt may also be walking to bathroom independently in room.      Vision   Additional Comments: Pt unsure if he wears glasses at baseline. Endorses no double vision/dizziness.      Perception     Praxis      Pertinent Vitals/Pain Pain Assessment: No/denies pain     Hand Dominance Right   Extremity/Trunk Assessment Upper Extremity Assessment Upper Extremity Assessment: Generalized weakness   Lower Extremity Assessment Lower Extremity Assessment: Generalized weakness       Communication     Cognition Arousal/Alertness: Awake/alert Behavior During Therapy: Flat  affect Overall Cognitive Status: No family/caregiver present to determine baseline cognitive functioning                                 General Comments: Per chart pt appears to have had a decline in cognition over time during hospitalization.    General Comments  Pt with significant memory loss. He spoke at length about tangential subjects including statements such as "people once lived [phone] years" and "Our value is only in money", but was unable to answer questions about present/prior level of function. Declined to demonstrate or participate in ADL tasks. OT utilized therapeutic use of self and VC's for redirection to support pt participation in OT evaluation, but participation was very limited.     Exercises Other Exercises Other Exercises: Pt educated on strategies for maintaining regular sleep/wake cycles in including turning on lights/lifting blinds during the day, finding activities to occupy his time during the day, and decreasing environmental stimulation at night in order to decrease risk of delirium and improve functional participation.   Shoulder Instructions      Home Living Family/patient expects to be discharged to:: Unsure Living Arrangements: Other (Comment)(Pt states he is unable to remember living arragements. )                                      Prior Functioning/Environment          Comments: Pt with memory loss. Is unable to recall PLOF, although on this date he states he remembers driving "at one time". Does not recall ADL/IADLs prior to hospital admission.         OT Problem List: Decreased strength;Impaired balance (sitting and/or standing);Decreased cognition;Decreased safety awareness;Decreased activity tolerance;Decreased coordination;Decreased knowledge of use of DME or AE;Impaired UE functional use      OT Treatment/Interventions: Self-care/ADL training;Manual therapy;Therapeutic exercise;Patient/family  education;Neuromuscular education;Therapeutic activities;Energy conservation;DME and/or AE instruction;Cognitive remediation/compensation;Balance training;Modalities    OT Goals(Current goals can be found in the care plan section) Acute Rehab OT Goals Patient Stated Goal: To go home and live independently OT Goal Formulation: With patient Time For Goal Achievement: 03/04/19 Potential to Achieve Goals: Good ADL Goals Pt Will Perform Grooming: Independently;sitting;with adaptive equipment(With LRAD for safety and improved functional independence.) Pt Will Perform Upper Body Dressing: with set-up;sitting Pt Will Perform Lower Body Dressing: with adaptive equipment;sit to/from stand;with set-up(With LRAD for safety and improved functional independence.) Additional ADL Goal #1: Pt will verbalize a plan to implement at least 3 falls prevention strategies into his daily routines for improved functional independence and return to meaninful occupations of daily life.  OT Frequency: Min 2X/week   Barriers to D/C: Decreased caregiver support;Inaccessible home environment          Co-evaluation  AM-PAC OT "6 Clicks" Daily Activity     Outcome Measure Help from another person eating meals?: None Help from another person taking care of personal grooming?: A Lot Help from another person toileting, which includes using toliet, bedpan, or urinal?: Total Help from another person bathing (including washing, rinsing, drying)?: Total Help from another person to put on and taking off regular upper body clothing?: A Lot Help from another person to put on and taking off regular lower body clothing?: Total 6 Click Score: 11   End of Session    Activity Tolerance: Other (comment)(Treatment limite 2/2 pt willingness to participate) Patient left: in bed;with bed alarm set;with call bell/phone within reach;with nursing/sitter in room  OT Visit Diagnosis: Other abnormalities of gait and  mobility (R26.89);Muscle weakness (generalized) (M62.81)                Time: 1340-1410 OT Time Calculation (min): 30 min Charges:  OT General Charges $OT Visit: 1 Visit OT Evaluation $OT Eval Low Complexity: 1 Low OT Treatments $Self Care/Home Management : 8-22 mins  Rockney Ghee, M.S., OTR/L Ascom: [phone] 02/18/19, 3:02 PM

## 2019-02-19 LAB — CBC
HCT: 42 % (ref 39.0–52.0)
Hemoglobin: 13.7 g/dL (ref 13.0–17.0)
MCH: 29 pg (ref 26.0–34.0)
MCHC: 32.6 g/dL (ref 30.0–36.0)
MCV: 88.8 fL (ref 80.0–100.0)
Platelets: 158 10*3/uL (ref 150–400)
RBC: 4.73 MIL/uL (ref 4.22–5.81)
RDW: 14.4 % (ref 11.5–15.5)
WBC: 7.5 10*3/uL (ref 4.0–10.5)
nRBC: 0 % (ref 0.0–0.2)

## 2019-02-19 LAB — CREATININE, SERUM
Creatinine, Ser: 0.92 mg/dL (ref 0.61–1.24)
GFR calc Af Amer: >60 mL/min (ref >60)
GFR calc non Af Amer: >60 mL/min (ref >60)

## 2019-02-19 NOTE — Progress Notes (Signed)
PT Cancellation Note  Patient Details Name: Maxwell Miller MRN: [id number] DOB: 1960/12/20   Cancelled Treatment:    Reason Eval/Treat Not Completed: Fatigue/lethargy limiting ability to participate;Other (comment)(Patient fatigued, sleeping in bed. PT provided encouragement and importance of mobility, pt refused. Agreeable to attempt in the PM. PT will follow up as able.)   Olga Coaster PT, DPT [phone] AM,02/19/19 [phone]

## 2019-02-19 NOTE — Progress Notes (Signed)
Physical Therapy Treatment Patient Details Name: Maxwell Miller MRN: [id number] DOB: January 04, 1961 Today's Date: 02/19/2019    History of Present Illness 58 yo male with onset of AMS wtih confusion and fever, sepsis was admitted and noted encephalopathy, has recent B thalamic hemorrhages.  PMHx:  portal vein thrombosis, CVA, HTN, erythrocytosis.  Pt had walked as far as 500 ft with PT this hosptialization, but has shown consistent decline in phyiscal and mental status.    PT Comments    Patient agreeable to PT this PM. Denies pain at start of session.  At end of session pt points to chest and posterior R thigh and expresses that he has some pain located there while bed was in chair position.  RN notified, no pain signs/symptoms pt in NAD, HR WFLs. Pt needed encouragement throughout to participate with PT, extended time needed for cognitive processing. Supine to sit maxA for LE positioning, pt attempts to assist with trunk elevation with UE use. Sat EOB for several minutes; modA-CGA to maintain sitting balance, flucuating throughout time with intermittent L lean or posterior lean. Sit <> stand with RW and maxAx2. Pt unable to stand without ongoing physical assist and unable to tolerate >10seconds. Second attempt deferred due to decreased EOB balance and fatigue. Pt returned to supine, and repositioned in bed. The patient would benefit from further skilled PT intervention to address deficits in safety, mobility, and function.    Follow Up Recommendations  SNF     Equipment Recommendations  Wheelchair (measurements PT);Wheelchair cushion (measurements PT);3in1 (PT)    Recommendations for Other Services       Precautions / Restrictions Precautions Precautions: Fall Restrictions Weight Bearing Restrictions: No    Mobility  Bed Mobility Overal bed mobility: Needs Assistance Bed Mobility: Supine to Sit;Sit to Supine     Supine to sit: Max assist;HOB elevated Sit to supine: Max assist;HOB  elevated;+2 for physical assistance   General bed mobility comments: Given extra time for cognitive processing of transfers to increase participation. maxA for LE positioning, pt attempts to assist with trunk elevation, though still maxA.  Transfers Overall transfer level: Needs assistance Equipment used: Rolling walker (2 wheeled) Transfers: Sit to/from Stand Sit to Stand: Max assist;+2 physical assistance         General transfer comment: assist to initiate and come to full stand; assist to control descent sitting. Attempts made to block R knee to improve stability, unable to tolerate standing >10seconds.  Ambulation/Gait   Gait Distance (Feet): 0 Feet         General Gait Details: unable to shift weight at this time   Stairs             Wheelchair Mobility    Modified Rankin (Stroke Patients Only)       Balance Overall balance assessment: Needs assistance Sitting-balance support: Bilateral upper extremity supported;Feet supported Sitting balance-Leahy Scale: Poor Sitting balance - Comments: From modA-CGA to maintain sitting balance, flucuating throughout time at EOB. Postural control: Right lateral lean;Posterior lean   Standing balance-Leahy Scale: Zero                              Cognition Arousal/Alertness: Awake/alert Behavior During Therapy: Flat affect                                          Exercises  General Comments        Pertinent Vitals/Pain Pain Assessment: Faces Faces Pain Scale: No hurt Pain Location: No physical signs of pain, but end of session pt points to chest and Posterior R thigh and expresses that he has some pain located there while in chair position Pain Intervention(s): Limited activity within patient's tolerance;Monitored during session;Repositioned    Home Living                      Prior Function            PT Goals (current goals can now be found in the care plan  section) Progress towards PT goals: Progressing toward goals    Frequency    Min 2X/week      PT Plan Current plan remains appropriate    Co-evaluation              AM-PAC PT "6 Clicks" Mobility   Outcome Measure  Help needed turning from your back to your side while in a flat bed without using bedrails?: A Lot Help needed moving from lying on your back to sitting on the side of a flat bed without using bedrails?: A Lot Help needed moving to and from a bed to a chair (including a wheelchair)?: Total Help needed standing up from a chair using your arms (e.g., wheelchair or bedside chair)?: Total Help needed to walk in hospital room?: Total Help needed climbing 3-5 steps with a railing? : Total 6 Click Score: 8    End of Session Equipment Utilized During Treatment: Gait belt Activity Tolerance: Patient limited by fatigue Patient left: in bed;with bed alarm set;Other (comment);with call bell/phone within reach(bed in chair position) Nurse Communication: Mobility status PT Visit Diagnosis: Unsteadiness on feet (R26.81);Other abnormalities of gait and mobility (R26.89);Adult, failure to thrive (R62.7)     Time: 1423-9532 PT Time Calculation (min) (ACUTE ONLY): 32 min  Charges:  $Therapeutic Activity: 8-22 mins $Neuromuscular Re-education: 8-22 mins                     Olga Coaster PT, DPT 2:05 PM,02/19/19 [phone]

## 2019-02-19 NOTE — Progress Notes (Signed)
   [facility] [place] [facility]   PATIENT NAME: Maxwell Miller    MR#:  [id number]  DATE OF BIRTH:  18-Jun-1961  No new changes   Awake and alert.  Confused  afebrile  REVIEW OF SYSTEMS:  Review of Systems  Constitutional: Unable to provide due to confusion  DRUG ALLERGIES:  No Known Allergies  VITALS:  Blood pressure 116/88, pulse 74, temperature 98.3 F (36.8 C), temperature source Oral, resp. rate 17, height 5\' 11"  (1.803 m), weight 87.6 kg, SpO2 97 %.  PHYSICAL EXAMINATION:  Physical Exam   GENERAL:  58 y.o.-year-old patient lying in the bed with no acute distress.  EYES: Pupils equal, round, reactive to light and accommodation. No scleral icterus. Extraocular muscles intact.  HEENT: Head atraumatic, normocephalic. Oropharynx and nasopharynx clear.  NECK:  Supple, no jugular venous distention. No thyroid enlargement, no tenderness.  LUNGS: Normal breath sounds bilaterally, no wheezing, rales,rhonchi or crepitation. No use of accessory muscles of respiration.  CARDIOVASCULAR: S1, S2 normal. No murmurs, rubs, or gallops.  ABDOMEN: Soft, non tender  Bowel sounds present. No organomegaly or mass.  EXTREMITIES: No pedal edema, cyanosis, or clubbing.  NEUROLOGIC: Motor strength 5/5 in upper and lower extremities pSYCHIATRIC:patient is awake and alert SKIN: No obvious rash, lesion, or ulcer.    LABORATORY PANEL:   CBC Recent Labs  Lab 02/19/19 0545  WBC 7.5  HGB 13.7  HCT 42.0  PLT 158   ------------------------------------------------------------------------------------------------------------------  Chemistries  Recent Labs  Lab 02/19/19 0545  CREATININE 0.92   ------------------------------------------------------------------------------------------------------------------  Cardiac Enzymes No results for input(s): TROPONINI in the last 168 hours.  ------------------------------------------------------------------------------------------------------------------  RADIOLOGY:  No results found.  EKG:   Orders placed or performed during the hospital encounter of 01/20/19  . EKG 12-Lead  . EKG 12-Lead  . ED EKG 12-Lead  . ED EKG 12-Lead    ASSESSMENT AND PLAN:   58 year old male with history of intracranial hemorrhage x2, short-term memory loss and erythrocytosis who presents with generalized weakness, confusion and fever.  1.Sepsis: likely due toCT suggestive of possible proctitis -[name] in the blood on 01/20/19 -finished trt with PO bactrim, per ID -Resolved  2. Confusion-short-term memory loss.  Had rapidly progressive dementia.  ?  Underlying vascular dementia as well. 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  No acute findings or no features of meningitis seen. -Seems to be at baseline.  Needs placement - Depressed. Started remeron  3.Portal vein thrombosis - on eliquis-monitor closely especially given history of intraventricular hemorrhage in the past  4.Generalized weakness-improving. -  Physical therapy recommended home health  5.Hypertension-on Norvasc  Currently no family.  Will need a  guardian APS notified- working on getting a guardian for the patient and then placement  Appreciate social worker input   CODE STATUS: Full Code  TOTAL TIME TAKING CARE OF THIS PATIENT: 20 minutes.    Molinda Bailiff [name] M.D on 02/19/2019 at 11:04 AM  Between 7am to 6pm - Pager - [phone]  After 6pm go to [email or web address] - Social research officer, government  [facility]  Office  [phone]  CC: Primary care physician; Mable Paris, PA-C

## 2019-02-19 NOTE — Progress Notes (Signed)
Nutrition Follow-up  RD working remotely.  DOCUMENTATION CODES:   Non-severe (moderate) malnutrition in context of social or environmental circumstances  INTERVENTION:  Continue Ensure Enlive po BID, each supplement provides 350 kcal and 20 grams of protein.   NUTRITION DIAGNOSIS:   Moderate Malnutrition related to social / environmental circumstances as evidenced by mild fat depletion, moderate fat depletion, mild muscle depletion, moderate muscle depletion.  Ongoing.  GOAL:   Patient will meet greater than or equal to 90% of their needs  Progressing.  MONITOR:   PO intake, Supplement acceptance, Weight trends, I & O's  REASON FOR ASSESSMENT:   LOS    ASSESSMENT:   58 year old male who presented to the ED on 3/10 with AMS. PMH of stroke, HTN, memory loss. Pt admitted with sepsis.  Per chart patient now eating 100% of his meals very regularly. Yesterday he finished 100% of all 3 meals. Per chart he is able to self-feed but requires assistance with other ADLs. He has been drinking Ensure Enlive 2-3 times per day. Still pending guardianship and placement.  Medications reviewed and include: Eliquis, folic acid 1 mg daily, Remeron 15 mg QHS, MVI daily, Miralax, senna-docusate, Flomax, thiamine 100 mg daily.  Labs reviewed.  Diet Order:   Diet Order            Diet - low sodium heart healthy        Diet regular Room service appropriate? Yes; Fluid consistency: Thin  Diet effective now             EDUCATION NEEDS:   Education needs have been addressed  Skin:  Skin Assessment: Reviewed RN Assessment  Last BM:  02/18/2019 - large type 1  Height:   Ht Readings from Last 1 Encounters:  01/20/19 5\' 11"  (1.803 m)   Weight:   Wt Readings from Last 1 Encounters:  02/12/19 87.6 kg   Ideal Body Weight:  78.2 kg  BMI:  Body mass index is 26.93 kg/m.  Estimated Nutritional Needs:   Kcal:  2150-2350  Protein:  110-125 grams  Fluid:  >/= 2.2 L  Helane Rima, MS, RD, LDN Office: [phone] Pager: [phone] After Hours/Weekend Pager: [phone]

## 2019-02-19 NOTE — Progress Notes (Signed)
Occupational Therapy Treatment Patient Details Name: Maxwell GurneyHansy Miller MRN: [id number] DOB: 05/19/1961 Today's Date: 02/19/2019    History of present illness 58 yo male with onset of AMS wtih confusion and fever, sepsis was admitted and noted encephalopathy, has recent B thalamic hemorrhages.  PMHx:  portal vein thrombosis, CVA, HTN, erythrocytosis.  Pt had walked as far as 500 ft with PT this hosptialization, but has shown consistent decline in phyiscal and mental status.   OT comments  Pt seen for OT tx this date focusing on routine and novel tasks to challenge his problem solving/cognitive skills and motor skills. Pt had mild difficulty maintaining focus on task at hand when presented with higher level cognitive challenge during meal ordering activity requiring decreased visual stimulus, cues to break down task into sub groups of food items, and written menu items to tell the dining services person over the phone. Pt performed with less cuing doing more routine tasks such as washing his face, brushing his teeth, and applying lotion and deodorant after set up and cue to initiate. Pt progressing towards goals but continues to benefit from skilled OT services to maximize safety/indep with ADL and IADL in order to facilitate safe return home. Continue to recommend STR and initial supervision/assist for safety.   Follow Up Recommendations  SNF;Supervision/Assistance - 24 hour    Equipment Recommendations  Other (comment)(TBD)    Recommendations for Other Services      Precautions / Restrictions Precautions Precautions: Fall Restrictions Weight Bearing Restrictions: No       Mobility Bed Mobility  Transfers    Balance                             ADL either performed or assessed with clinical judgement   ADL Overall ADL's : Needs assistance/impaired Eating/Feeding: Sitting;Set up   Grooming: Sitting;Set up;Cueing for sequencing;Applying deodorant;Oral care;Wash/dry  face;Wash/dry hands Grooming Details (indicate cue type and reason): Seated EOB, pt required cues to initiate but able to take items and brush teeth, wash face and hands, and apply deodorant and lotion without physical assist                                     Vision Patient Visual Report: No change from baseline     Perception     Praxis      Cognition Arousal/Alertness: Awake/alert Behavior During Therapy: WFL for tasks assessed/performed Overall Cognitive Status: No family/caregiver present to determine baseline cognitive functioning                                 General Comments: difficulty with orientation, memory, and problem solving. Able to follow simple and 3-step commands without difficulty with routine and novel tasks        Exercises Other Exercises Other Exercises: OT facilitated food ordering activity with pt identifying items on menu and relaying them to the dining person via phone. Pt had difficulty remaining on task for ordering dinner and kept referencing working at [employer] before. With cues to redirect and limiting visual stimuli, pt was able to idenfity a main course, sides, dessert, and a drink with cues from therapist to identify each food group to minimize choices. Pt dialed the phone number with visual cue for locating number on board and with written order on  piece of paper, pt was able to relay this to the dining services person.    Shoulder Instructions       General Comments      Pertinent Vitals/ Pain       Pain Assessment: No/denies pain Faces Pain Scale: No hurt Pain Location: No physical signs of pain, but end of session pt points to chest and Posterior R thigh and expresses that he has some pain located there while in chair position Pain Intervention(s): Monitored during session  Home Living                                          Prior Functioning/Environment               Frequency  Min 2X/week        Progress Toward Goals  OT Goals(current goals can now be found in the care plan section)  Progress towards OT goals: Progressing toward goals  Acute Rehab OT Goals Patient Stated Goal: To go home and live independently OT Goal Formulation: With patient Time For Goal Achievement: 03/04/19 Potential to Achieve Goals: Good  Plan Discharge plan remains appropriate;Frequency remains appropriate    Co-evaluation                 AM-PAC OT "6 Clicks" Daily Activity     Outcome Measure   Help from another person eating meals?: None Help from another person taking care of personal grooming?: A Little Help from another person toileting, which includes using toliet, bedpan, or urinal?: Total Help from another person bathing (including washing, rinsing, drying)?: Total Help from another person to put on and taking off regular upper body clothing?: A Lot Help from another person to put on and taking off regular lower body clothing?: Total 6 Click Score: 12    End of Session    OT Visit Diagnosis: Other abnormalities of gait and mobility (R26.89);Muscle weakness (generalized) (M62.81)   Activity Tolerance Patient tolerated treatment well   Patient Left in bed;with bed alarm set;with call bell/phone within reach   Nurse Communication          Time: 1962-2297 OT Time Calculation (min): 53 min  Charges: OT General Charges $OT Visit: 1 Visit OT Treatments $Self Care/Home Management : 53-67 mins  Richrd Prime, MPH, MS, OTR/L ascom [phone] 02/19/19, 4:30 PM

## 2019-02-19 NOTE — TOC Progression Note (Signed)
Transition of Care [facility]) - Progression Note    Patient Details  Name: Maxwell Miller MRN: [id number] Date of Birth: 12/10/1960  Transition of Care [facility]) CM/SW Contact  Ruthe Mannan, [place] Phone Number: 02/19/2019, 1:55 PM  Clinical Narrative:  CSW spoke with Arthur Holms, with [facility]. [phone]. Trula Ore states that she has started application for SSI and then will begin disability and Medicaid. Per Trula Ore, DSS is pursuing guardianship for patient but currently the courts are closed due to the Covid-19 Pandemic. CSW will continue to follow for discharge planning.      Expected Discharge Plan: Home w Home Health Services Barriers to Discharge: Family Issues, Transportation  Expected Discharge Plan and Services Expected Discharge Plan: Home w Home Health Services   Discharge Planning Services: CM Consult Post Acute Care Choice: Home Health, Durable Medical Equipment   Expected Discharge Date: 01/24/19                   HH Arranged: RN, PT, Nurse's Aide HH Agency: [facility]   Social Determinants of Health (SDOH) Interventions    Readmission Risk Interventions No flowsheet data found.

## 2019-02-20 NOTE — Progress Notes (Signed)
   [facility] [place] [facility]   PATIENT NAME: Maxwell Miller    MR#:  [id number]  DATE OF BIRTH:  1961/01/13  No new changes   Awake and alert.  Confused  Good appetite  REVIEW OF SYSTEMS:  Review of Systems  Constitutional: Unable to provide due to confusion  DRUG ALLERGIES:  No Known Allergies  VITALS:  Blood pressure 112/79, pulse 61, temperature 98 F (36.7 C), temperature source Oral, resp. rate 16, height 5\' 11"  (1.803 m), weight 87.6 kg, SpO2 100 %.  PHYSICAL EXAMINATION:  Physical Exam   GENERAL:  58 y.o.-year-old patient lying in the bed with no acute distress.  EYES: Pupils equal, round, reactive to light and accommodation. No scleral icterus. Extraocular muscles intact.  HEENT: Head atraumatic, normocephalic. Oropharynx and nasopharynx clear.  NECK:  Supple, no jugular venous distention. No thyroid enlargement, no tenderness.  LUNGS: Normal breath sounds bilaterally, no wheezing, rales,rhonchi or crepitation. No use of accessory muscles of respiration.  CARDIOVASCULAR: S1, S2 normal. No murmurs, rubs, or gallops.  ABDOMEN: Soft, non tender  Bowel sounds present. No organomegaly or mass.  EXTREMITIES: No pedal edema, cyanosis, or clubbing.  NEUROLOGIC: Motor strength 5/5 in upper and lower extremities pSYCHIATRIC:patient is awake and alert SKIN: No obvious rash, lesion, or ulcer.    LABORATORY PANEL:   CBC Recent Labs  Lab 02/19/19 0545  WBC 7.5  HGB 13.7  HCT 42.0  PLT 158   ------------------------------------------------------------------------------------------------------------------  Chemistries  Recent Labs  Lab 02/19/19 0545  CREATININE 0.92   ------------------------------------------------------------------------------------------------------------------  Cardiac Enzymes No results for input(s): TROPONINI in the last 168 hours.  ------------------------------------------------------------------------------------------------------------------  RADIOLOGY:  No results found.  EKG:   Orders placed or performed during the hospital encounter of 01/20/19  . EKG 12-Lead  . EKG 12-Lead  . ED EKG 12-Lead  . ED EKG 12-Lead    ASSESSMENT AND PLAN:   58 year old male with history of intracranial hemorrhage x2, short-term memory loss and erythrocytosis who presents with generalized weakness, confusion and fever.  1.Sepsis: likely due toCT suggestive of possible proctitis -[name] in the blood on 01/20/19 -finished trt with PO bactrim, per ID -Resolved  2. Confusion-short-term memory loss.  Had rapidly progressive dementia.  ?  Underlying vascular dementia as well. 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  No acute findings or no features of meningitis seen. -Seems to be at baseline.  Needs placement - Depressed. Started remeron  3.Portal vein thrombosis - on eliquis-monitor closely especially given history of intraventricular hemorrhage in the past  4.Generalized weakness-improving. -  Physical therapy recommended home health  5.Hypertension-on Norvasc  Currently no family.  Will need a  guardian APS notified- working on getting a guardian for the patient and then placement  Appreciate social worker input   CODE STATUS: Full Code  TOTAL TIME TAKING CARE OF THIS PATIENT: 20 minutes.    Molinda Bailiff Yarethzi Branan M.D on 02/20/2019 at 9:38 AM  Between 7am to 6pm - Pager - [phone]  After 6pm go to [email or web address] - Social research officer, government  [facility]  Office  [phone]  CC: Primary care physician; Mable Paris, PA-C

## 2019-02-20 NOTE — Plan of Care (Signed)
  Problem: Skin Integrity: Goal: Risk for impaired skin integrity will decrease Outcome: Progressing   Problem: Activity: Goal: Risk for activity intolerance will decrease Outcome: Progressing   Problem: Safety: Goal: Ability to remain free from injury will improve Outcome: Progressing   Problem: Skin Integrity: Goal: Risk for impaired skin integrity will decrease Outcome: Progressing   

## 2019-02-20 NOTE — Progress Notes (Signed)
Physical Therapy Treatment Patient Details Name: Maxwell Miller MRN: [id number] DOB: [birth year] Today's Date: 02/20/2019    History of Present Illness 58 yo male with onset of AMS wtih confusion and fever, sepsis was admitted and noted encephalopathy, has recent B thalamic hemorrhages.  PMHx:  portal vein thrombosis, CVA, HTN, erythrocytosis.  Pt had walked as far as 500 ft with PT this hosptialization, but has shown consistent decline in phyiscal and mental status.    PT Comments    Patient tolerated treatment well and demonstrated progress towards goals since last physical therapy treatment session. Patient required continued encouragement to participate and questioned every rationale given by PT. Continued to insist that it is safe and a good idea for him to cook from bed (pt likes to cook, attempted to encourage him to get up so he can return to cooking). Patient more inquisitive about why his legs and arms are not cooperating with his efforts to move them towards end of visit and verbalized understanding on some education about this. Patient continues to demonstrate decreased understanding of the situation and difficulty initiating and performing movement. Required max A +2 for bed mobility, sit <> stand transfers, and standing balance. He required intermittent min A physical assist for seated balance. Patient performed sit <> stand from elevated edge of bed to RW with max A +2 and sit <> stand bed to chair using RW and max A +2 including assistance to advance feet to get to chair. Patient complained of dizziness initially with transfers but this seemed to improve with repetition and rest. BP and HR were monitored and no orthostatic response was noted. Patient would benefit from continued physical therapy to address remaining impairments and functional limitations to work towards stated goals and return to PLOF or maximal functional independence.    Follow Up Recommendations  SNF      Equipment Recommendations  Wheelchair (measurements PT);Wheelchair cushion (measurements PT);3in1 (PT)    Recommendations for Other Services       Precautions / Restrictions Precautions Precautions: Fall Restrictions Weight Bearing Restrictions: No    Mobility  Bed Mobility Overal bed mobility: Needs Assistance Bed Mobility: Supine to Sit     Supine to sit: Max assist;HOB elevated;+2 for physical assistance     General bed mobility comments: Given extra time for cognitive processing of transfers to increase participation. maxA for LE positioning and trunk, pt makes small attempts to help. States his body won't move even though he is telling it to.   Transfers Overall transfer level: Needs assistance Equipment used: Rolling walker (2 wheeled) Transfers: Sit to/from Stand Sit to Stand: Max assist;+2 physical assistance         General transfer comment: performed sit <> stand from elevated edge of bed to RW with max A +2 standing at each side, blocking feet, occasionally knees, with improved response to tactiile cuing at quads. Completed sit <> stand transfer elevated bed to chair with RW and max A x 2 standing at each side and providing physical assist to weight shift and advance feet. Practiced standing balance between repetitions. Pt able stand 60-90 seconds at a time, shaking, leaning backwards.   Ambulation/Gait Ambulation/Gait assistance: Max assist;+2 physical assistance Gait Distance (Feet): 2 Feet Assistive device: Rolling walker (2 wheeled)   Gait velocity: decreased   General Gait Details: unable to shift weight at this time, requires physical assistance to advance feet. took a few steps to transfer from bed to chair. leaning backwards, fails to flex knees when  moving feet backwards.    Stairs             Wheelchair Mobility    Modified Rankin (Stroke Patients Only)       Balance Overall balance assessment: Needs assistance Sitting-balance  support: Bilateral upper extremity supported;Feet supported Sitting balance-Leahy Scale: Fair Sitting balance - Comments: Patient gained control of trunk for sitting with supervision and BUE support with supervision, but occasionally seemed to forget to hold himself up and required min A to return to sitting position after allowing trunk to fall backwards.  Postural control: Posterior lean Standing balance support: Bilateral upper extremity supported Standing balance-Leahy Scale: Poor Standing balance comment: able to stand at [facility] for up to 1.5 minutes with max A +2. Did have a few moments of improved balance with near CGA assistance needed but emmediately started falling backwards when contact guard removed.                             Cognition Arousal/Alertness: Awake/alert Behavior During Therapy: (reluctant to participate) Overall Cognitive Status: No family/caregiver present to determine baseline cognitive functioning                                 General Comments: pt resistant to participation. Insists that he can cook lying in bed if the range is properly positioned. Argues with clinician and questions all rationale for movement. States his legs won't move when he is asking them to and wants to know why. difficulty with movement initiation, follow through, problem solving, memory. Became more pleasant and cooperative as the session progressed.       Exercises Other Exercises Other Exercises: standing balance x 5 trials using RW with max A +2 and heavy cuing. Able to stand 60-90 seconds at a time. Pt reported feeling dizzy, vitals were taken in sitting (141/96 HR 82) and standing (149/111 HR 114). Dizziness, shakiness, and imbalance continued to improve with repetition but pt fatigued quickly. Educated pt extensively about the importance of mobility.      General Comments General comments (skin integrity, edema, etc.): Patient very argumentative and questioning  every request for mobility. Required extra time for all activities to encourage him to participate and give him the opportunity to initiate movement.       Pertinent Vitals/Pain Pain Assessment: No/denies pain    Home Living                      Prior Function            PT Goals (current goals can now be found in the care plan section) Acute Rehab PT Goals Patient Stated Goal: to walk with PT  PT Goal Formulation: With patient Time For Goal Achievement: 02/27/19 Potential to Achieve Goals: Fair Progress towards PT goals: Progressing toward goals    Frequency    Min 2X/week      PT Plan Current plan remains appropriate    Co-evaluation              AM-PAC PT "6 Clicks" Mobility   Outcome Measure  Help needed turning from your back to your side while in a flat bed without using bedrails?: A Lot Help needed moving from lying on your back to sitting on the side of a flat bed without using bedrails?: A Lot Help needed moving to and from a bed  to a chair (including a wheelchair)?: A Lot Help needed standing up from a chair using your arms (e.g., wheelchair or bedside chair)?: A Lot Help needed to walk in hospital room?: Total Help needed climbing 3-5 steps with a railing? : Total 6 Click Score: 10    End of Session Equipment Utilized During Treatment: Gait belt Activity Tolerance: Patient limited by fatigue Patient left: with call bell/phone within reach;in chair;with chair alarm set Nurse Communication: Mobility status(results of session) PT Visit Diagnosis: Unsteadiness on feet (R26.81);Other abnormalities of gait and mobility (R26.89);Adult, failure to thrive (R62.7)     Time: 1005-1050 PT Time Calculation (min) (ACUTE ONLY): 45 min  Charges:  $Therapeutic Activity: 38-52 mins                     Luretha Murphy. Ilsa Iha, PT, DPT 02/20/19, 11:22 AM

## 2019-02-20 NOTE — Progress Notes (Signed)
Occupational Therapy Treatment Patient Details Name: Maxwell Miller MRN: [id number] DOB: Dec 13, 1960 Today's Date: 02/20/2019    History of present illness 58 yo male with onset of AMS wtih confusion and fever, sepsis was admitted and noted encephalopathy, has recent B thalamic hemorrhages.  PMHx:  portal vein thrombosis, CVA, HTN, erythrocytosis.  Pt had walked as far as 500 ft with PT this hosptialization, but has shown consistent decline in phyiscal and mental status.   OT comments  Pt initially declining therapy, with encouragement agreeable. Pt expresses frustration at "expectations" and "perceptions" not matching what he is receiving ("it's like you're offering the world but then I just sit in this chair"). OT provided active listening and emotional support. Pt requires cues to redirect to task. Pt declines to participate in ordering his lunch meal, stating "it's all the same." OT encouraged pt to perhaps start a food log/diary to help with recall of his preferred menu items for the next meal. Pt declined. Pt agreeable to seated grooming tasks. Pt able to perform with no verbal cues to initiate this time, just set up of items. Pt continues to be confused, having difficulty with problem solving and memory. 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 Pt continues to benefit from skilled OT services to maximize return to PLOF.    Follow Up Recommendations  SNF;Supervision/Assistance - 24 hour    Equipment Recommendations  Other (comment)(TBD)    Recommendations for Other Services      Precautions / Restrictions Precautions Precautions: Fall Restrictions Weight Bearing Restrictions: No       Mobility Bed Mobility     General bed mobility comments: deferred, up in recliner for start and end of session      Balance                           ADL either performed or assessed with clinical judgement   ADL       Grooming: Wash/dry  hands;Wash/dry face;Oral care;Set up;Supervision/safety;Sitting Grooming Details (indicate cue type and reason): no cues needed this date after set up of materials to perform grooming tasks from a seated position.                                     Vision Patient Visual Report: No change from baseline     Perception     Praxis      Cognition Arousal/Alertness: Awake/alert Behavior During Therapy: WFL for tasks assessed/performed Overall Cognitive Status: No family/caregiver present to determine baseline cognitive functioning                                 General Comments: Pt expressing frustration at the limited options for meal items and receiving flat soda. Declined a fresh soda. Continues to be confused with impaired memory, problem solving, and initiation.        Exercises    Shoulder Instructions       General Comments     Pertinent Vitals/ Pain       Pain Assessment: No/denies pain  Home Living  Prior Functioning/Environment              Frequency  Min 2X/week        Progress Toward Goals  OT Goals(current goals can now be found in the care plan section)  Progress towards OT goals: Progressing toward goals  Acute Rehab OT Goals Patient Stated Goal: to cook again OT Goal Formulation: With patient Time For Goal Achievement: 03/04/19 Potential to Achieve Goals: Good  Plan Discharge plan remains appropriate;Frequency remains appropriate    Co-evaluation                 AM-PAC OT "6 Clicks" Daily Activity     Outcome Measure   Help from another person eating meals?: None Help from another person taking care of personal grooming?: None Help from another person toileting, which includes using toliet, bedpan, or urinal?: Total Help from another person bathing (including washing, rinsing, drying)?: Total Help from another person to put on and taking off  regular upper body clothing?: A Lot Help from another person to put on and taking off regular lower body clothing?: Total 6 Click Score: 13    End of Session    OT Visit Diagnosis: Other abnormalities of gait and mobility (R26.89);Muscle weakness (generalized) (M62.81)   Activity Tolerance Patient tolerated treatment well   Patient Left in chair;with call bell/phone within reach;with chair alarm set   Nurse Communication          Time: [phone] OT Time Calculation (min): 23 min  Charges: OT General Charges $OT Visit: 1 Visit OT Treatments $Self Care/Home Management : 23-37 mins  Richrd Prime, MPH, MS, OTR/L ascom [phone] 02/20/19, 11:59 AM

## 2019-02-21 NOTE — Progress Notes (Addendum)
   [facility] - [place] at [facility]   PATIENT NAME: Maxwell Miller    MR#:  [id number]  DATE OF BIRTH:  1961/07/14  No new changes   Patient is confused and disoriented at baseline, no events overnight per nursing staff, awaiting placement/guardianship per case management REVIEW OF SYSTEMS:  Review of Systems  Constitutional: Unable to provide due to confusion  DRUG ALLERGIES:  No Known Allergies  VITALS:  Blood pressure 134/84, pulse 82, temperature 98.2 F (36.8 C), temperature source Oral, resp. rate 18, height 5\' 11"  (1.803 m), weight 87.6 kg, SpO2 94 %.  PHYSICAL EXAMINATION:  Physical Exam   GENERAL:  58 y.o.-year-old patient lying in the bed with no acute distress.  EYES: Pupils equal, round, reactive to light and accommodation. No scleral icterus. Extraocular muscles intact.  HEENT: Head atraumatic, normocephalic. Oropharynx and nasopharynx clear.  NECK:  Supple, no jugular venous distention. No thyroid enlargement, no tenderness.  LUNGS: Normal breath sounds bilaterally, no wheezing, rales,rhonchi or crepitation. No use of accessory muscles of respiration.  CARDIOVASCULAR: S1, S2 normal. No murmurs, rubs, or gallops.  ABDOMEN: Soft, non tender  Bowel sounds present. No organomegaly or mass.  EXTREMITIES: No pedal edema, cyanosis, or clubbing.  NEUROLOGIC: Motor strength 5/5 in upper and lower extremities pSYCHIATRIC:patient is awake and alert SKIN: No obvious rash, lesion, or ulcer.    LABORATORY PANEL:   CBC Recent Labs  Lab 02/19/19 0545  WBC 7.5  HGB 13.7  HCT 42.0  PLT 158   ------------------------------------------------------------------------------------------------------------------  Chemistries  Recent Labs  Lab 02/19/19 0545  CREATININE 0.92   ------------------------------------------------------------------------------------------------------------------  Cardiac Enzymes No results for input(s): TROPONINI in the  last 168 hours. ------------------------------------------------------------------------------------------------------------------  RADIOLOGY:  No results found.  EKG:   Orders placed or performed during the hospital encounter of 01/20/19  . EKG 12-Lead  . EKG 12-Lead  . ED EKG 12-Lead  . ED EKG 12-Lead    ASSESSMENT AND PLAN:  58 year old male with history of intracranial hemorrhage x2, short-term memory loss and erythrocytosis who presents with generalized weakness, confusion and fever.  *Acute Sepsis likely due toCT suggestive of possible proctitis Resolved [name] in the blood on 01/20/19 Completed course of antibiotics with p.o. Bactrim, infectious disease did see patient while in house   *Confusion, short-term memory loss Had rapidly progressive dementia - ?  Underlying vascular dementia as well. 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  No acute findings or no features of meningitis seen. Seems to be at baseline neurologically Awaiting placement  *Acute depression Stable Continue Remeron  *Portal vein thrombosis Stable Continue eliquis - monitor closely especially given history of intraventricular hemorrhage in the past  *Generalized weakness Improving PT recommended home health  *Hypertension, chronic Continue Norvasc  Currently no family-in discussion with case management/social work-awaiting guardianship/placement, unsafe discharge at this time, APS notified   CODE STATUS: Full Code  TOTAL TIME TAKING CARE OF THIS PATIENT: 25 minutes.    Evelena Asa Joban Colledge M.D on 02/21/2019 at 12:50 PM  Between 7am to 6pm - Pager - [phone]  After 6pm go to [email or web address] - Social research officer, government  [facility]  Office  [phone]  CC: Primary care physician; Mable Paris, PA-C

## 2019-02-22 LAB — CBC
HCT: 42.9 % (ref 39.0–52.0)
Hemoglobin: 14.3 g/dL (ref 13.0–17.0)
MCH: 28.9 pg (ref 26.0–34.0)
MCHC: 33.3 g/dL (ref 30.0–36.0)
MCV: 86.8 fL (ref 80.0–100.0)
Platelets: 152 10*3/uL (ref 150–400)
RBC: 4.94 MIL/uL (ref 4.22–5.81)
RDW: 14.7 % (ref 11.5–15.5)
WBC: 7.7 10*3/uL (ref 4.0–10.5)
nRBC: 0 % (ref 0.0–0.2)

## 2019-02-22 MED ORDER — POLYETHYLENE GLYCOL 3350 17 G PO PACK: 17.0000 g | PACK | Freq: Every day | ORAL | Status: DC | PRN

## 2019-02-22 MED ORDER — SENNOSIDES-DOCUSATE SODIUM 8.6-50 MG PO TABS: 1.0000 | ORAL_TABLET | Freq: Every evening | ORAL | Status: DC | PRN

## 2019-02-22 NOTE — Plan of Care (Signed)
  Problem: Skin Integrity: Goal: Risk for impaired skin integrity will decrease Outcome: Progressing  Dependent for hygiene care upon staff; able to wash his face Problem: Activity: Goal: Risk for activity intolerance will decrease Outcome: Progressing  Oob to chair; chair to bsc; bsc to bed; maximum assist with front wheeled walker and gait belt this shift Problem: Safety: Goal: Ability to remain free from injury will improve Outcome: Progressing   Problem: Skin Integrity: Goal: Risk for impaired skin integrity will decrease Outcome: Progressing

## 2019-02-22 NOTE — Plan of Care (Signed)
  Problem: Skin Integrity: Goal: Risk for impaired skin integrity will decrease Outcome: Progressing   Problem: Activity: Goal: Risk for activity intolerance will decrease Outcome: Progressing   Problem: Safety: Goal: Ability to remain free from injury will improve Outcome: Progressing   Problem: Skin Integrity: Goal: Risk for impaired skin integrity will decrease Outcome: Progressing   Problem: Skin Integrity: Goal: Risk for impaired skin integrity will decrease Outcome: Progressing

## 2019-02-22 NOTE — Progress Notes (Signed)
   [facility] - [place] at [place]   PATIENT NAME: Maxwell Miller    MR#:  [id number]  DATE OF BIRTH:  05/29/1961  No new changes   Patient is confused and disoriented at baseline, no events overnight per nursing staff, awaiting placement/guardianship per case management REVIEW OF SYSTEMS:  Review of Systems  Constitutional: Unable to provide due to confusion  DRUG ALLERGIES:  No Known Allergies  VITALS:  Blood pressure 109/76, pulse 63, temperature 98.2 F (36.8 C), temperature source Oral, resp. rate 16, height 5\' 11"  (1.803 m), weight 87.6 kg, SpO2 97 %.  PHYSICAL EXAMINATION:  Physical Exam   GENERAL:  58 y.o.-year-old patient lying in the bed with no acute distress.  EYES: Pupils equal, round, reactive to light and accommodation. No scleral icterus. Extraocular muscles intact.  HEENT: Head atraumatic, normocephalic. Oropharynx and nasopharynx clear.  NECK:  Supple, no jugular venous distention. No thyroid enlargement, no tenderness.  LUNGS: Normal breath sounds bilaterally, no wheezing, rales,rhonchi or crepitation. No use of accessory muscles of respiration.  CARDIOVASCULAR: S1, S2 normal. No murmurs, rubs, or gallops.  ABDOMEN: Soft, non tender  Bowel sounds present. No organomegaly or mass.  EXTREMITIES: No pedal edema, cyanosis, or clubbing.  NEUROLOGIC: Motor strength 5/5 in upper and lower extremities pSYCHIATRIC:patient is awake and alert SKIN: No obvious rash, lesion, or ulcer.    LABORATORY PANEL:   CBC Recent Labs  Lab 02/22/19 0356  WBC 7.7  HGB 14.3  HCT 42.9  PLT 152   ------------------------------------------------------------------------------------------------------------------  Chemistries  Recent Labs  Lab 02/19/19 0545  CREATININE 0.92   ------------------------------------------------------------------------------------------------------------------  Cardiac Enzymes No results for input(s): TROPONINI in the  last 168 hours. ------------------------------------------------------------------------------------------------------------------  RADIOLOGY:  No results found.  EKG:   Orders placed or performed during the hospital encounter of 01/20/19  . EKG 12-Lead  . EKG 12-Lead  . ED EKG 12-Lead  . ED EKG 12-Lead    ASSESSMENT AND PLAN:  58 year old male with history of intracranial hemorrhage x2, short-term memory loss and erythrocytosis who presents with generalized weakness, confusion and fever.  *Acute Sepsis likely due toCT suggestive of possible proctitis Resolved [name] in the blood on 01/20/19 Completed course of antibiotics with p.o. Bactrim, infectious disease did see patient while in house   *Confusion, short-term memory loss Had rapidly progressive dementia - ?  Underlying vascular dementia as well. 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  No acute findings or no features of meningitis seen. Seems to be at baseline neurologically Awaiting placement  *Acute depression Stable Continue Remeron  *Portal vein thrombosis Stable Continue eliquis - monitor closely especially given history of intraventricular hemorrhage in the past  *Generalized weakness Improving PT recommended home health  *Hypertension, chronic Continue Norvasc  Currently no family-in discussion with case management/social work-awaiting guardianship/placement, unsafe discharge at this time, APS notified   CODE STATUS: Full Code  TOTAL TIME TAKING CARE OF THIS PATIENT: 25 minutes.    Evelena Asa  M.D on 02/22/2019 at 12:16 PM  Between 7am to 6pm - Pager - [phone]  After 6pm go to [email or web address] - Social research officer, government  [facility] [place] [facility]  Office  [phone]  CC: Primary care physician; Mable Paris, PA-C

## 2019-02-23 NOTE — Plan of Care (Signed)
  Problem: Skin Integrity: Goal: Risk for impaired skin integrity will decrease 02/23/2019 0021 by Donnel Saxon, RN Outcome: Progressing 02/23/2019 0021 by Donnel Saxon, RN Outcome: Progressing   Problem: Activity: Goal: Risk for activity intolerance will decrease 02/23/2019 0021 by Donnel Saxon, RN Outcome: Progressing 02/23/2019 0021 by Donnel Saxon, RN Outcome: Progressing   Problem: Safety: Goal: Ability to remain free from injury will improve 02/23/2019 0021 by Donnel Saxon, RN Outcome: Progressing 02/23/2019 0021 by Donnel Saxon, RN Outcome: Progressing   Problem: Skin Integrity: Goal: Risk for impaired skin integrity will decrease 02/23/2019 0021 by Donnel Saxon, RN Outcome: Progressing 02/23/2019 0021 by Donnel Saxon, RN Outcome: Progressing

## 2019-02-23 NOTE — Progress Notes (Signed)
   [facility] - [place] at [facility]   PATIENT NAME: Maxwell Miller    MR#:  [id number]  DATE OF BIRTH:  08/31/1961  No new changes   Patient is confused and disoriented at baseline, no events overnight per nursing staff, awaiting placement/guardianship per case management REVIEW OF SYSTEMS:  Review of Systems  Constitutional: Unable to provide due to confusion  DRUG ALLERGIES:  No Known Allergies  VITALS:  Blood pressure 118/85, pulse 74, temperature 97.8 F (36.6 C), temperature source Tympanic, resp. rate 14, height 5\' 11"  (1.803 m), weight 87.6 kg, SpO2 97 %.  PHYSICAL EXAMINATION:  Physical Exam   GENERAL:  58 y.o.-year-old patient lying in the bed with no acute distress.  EYES: Pupils equal, round, reactive to light and accommodation. No scleral icterus. Extraocular muscles intact.  HEENT: Head atraumatic, normocephalic. Oropharynx and nasopharynx clear.  NECK:  Supple, no jugular venous distention. No thyroid enlargement, no tenderness.  LUNGS: Normal breath sounds bilaterally, no wheezing, rales,rhonchi or crepitation. No use of accessory muscles of respiration.  CARDIOVASCULAR: S1, S2 normal. No murmurs, rubs, or gallops.  ABDOMEN: Soft, non tender  Bowel sounds present. No organomegaly or mass.  EXTREMITIES: No pedal edema, cyanosis, or clubbing.  NEUROLOGIC: Motor strength 5/5 in upper and lower extremities pSYCHIATRIC:patient is awake and alert SKIN: No obvious rash, lesion, or ulcer.    LABORATORY PANEL:   CBC Recent Labs  Lab 02/22/19 0356  WBC 7.7  HGB 14.3  HCT 42.9  PLT 152   ------------------------------------------------------------------------------------------------------------------  Chemistries  Recent Labs  Lab 02/19/19 0545  CREATININE 0.92   ------------------------------------------------------------------------------------------------------------------  Cardiac Enzymes No results for input(s): TROPONINI in  the last 168 hours. ------------------------------------------------------------------------------------------------------------------  RADIOLOGY:  No results found.  EKG:   Orders placed or performed during the hospital encounter of 01/20/19  . EKG 12-Lead  . EKG 12-Lead  . ED EKG 12-Lead  . ED EKG 12-Lead    ASSESSMENT AND PLAN:  58 year old male with history of intracranial hemorrhage x2, short-term memory loss and erythrocytosis who presents with generalized weakness, confusion and fever.  *Acute Sepsis likely due toCT suggestive of possible proctitis Resolved [name] in the blood on 01/20/19 Completed course of antibiotics with p.o. Bactrim, infectious disease did see patient while in house   *Confusion, short-term memory loss Had rapidly progressive dementia - ?  Underlying vascular dementia as well. 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  No acute findings or no features of meningitis seen. Seems to be at baseline neurologically Awaiting placement  *Acute depression Stable Continue Remeron  *Portal vein thrombosis Stable Continue eliquis - monitor closely especially given history of intraventricular hemorrhage in the past  *Generalized weakness Improving PT recommended home health  *Hypertension, chronic Continue Norvasc  Currently no family-in discussion with case management/social work-awaiting guardianship/placement, unsafe discharge at this time, APS notified   CODE STATUS: Full Code  TOTAL TIME TAKING CARE OF THIS PATIENT: 25 minutes.    Evelena Asa Salary M.D on 02/23/2019 at 9:58 AM  Between 7am to 6pm - Pager - [phone]  After 6pm go to [email or web address] - Social research officer, government  [facility] [place] [facility]  Office  [phone]  CC: Primary care physician; Mable Paris, PA-C

## 2019-02-23 NOTE — Plan of Care (Signed)
  Problem: Skin Integrity: Goal: Risk for impaired skin integrity will decrease Outcome: Progressing   Problem: Activity: Goal: Risk for activity intolerance will decrease Outcome: Progressing   Problem: Safety: Goal: Ability to remain free from injury will improve Outcome: Progressing   Problem: Skin Integrity: Goal: Risk for impaired skin integrity will decrease Outcome: Progressing   

## 2019-02-23 NOTE — Progress Notes (Signed)
PT Cancellation Note  Patient Details Name: Phillipp Wiley MRN: [id number] DOB: 1961/11/12   Cancelled Treatment:    Reason Eval/Treat Not Completed: Patient declined, no reason specified;Other (comment)(see body of note)   Clinician and mobility tech arrived in room to work with patient. Clinician introduced patient to self and mobility tech and invited to participate in physical therapy. Clinician asked patient for name and date of birth as required by clinical procedure. Patient stated that clinician should tell him what it was. Clinician asked again nicely and patient again refused to answer insisting that clinician should provide that info. Clinician explained that if he was unable to answer that she would assume that he was unable to remember/did not know, or he was refusing to say. Clinician explained it is okay if he cannot remember but is part of his daily assessment to provide the best care. Patient became upset with this and said it was more the latter, refusing to say. He then went on to continue arguing stating things like those working with him "shouldn't make assumptions" and that people keep coming in with assumptions and ordering him around, that he is an individual and needs to be treated with respect. Mobility tech Jearld Adjutant, PT, DPT) stepped in and attempted to reason with him that PT and mobility were there trying to help an clinician had been very respectful. Patient continued to be verbally combative with escalating agression and clinician and mobility tech eventually left the room due to lack of meaningful conversation. Nursing notified.    Luretha Murphy. Ilsa Iha, PT, DPT 02/23/19, 3:10 PM

## 2019-02-23 NOTE — TOC Progression Note (Signed)
Transition of Care [facility]) - Progression Note    Patient Details  Name: Leonard Fagnani MRN: [id number] Date of Birth: [birth year]  Transition of Care [facility] CM/SW Contact  Ruthe Mannan, [place] Phone Number: 02/23/2019, 4:13 PM  Clinical Narrative:   CSW spoke with APS worker Arthur Holms. Ms. Tami Ribas states that they are still working on guardianship and will require a letter from the MD stating patient can not make his own decisions. CSW will provide letter for MD signature. Ms. Tami Ribas also states that she needs to meet with patient one more time. Ms. Tami Ribas is expecting to come tomorrow to the hospital to meet with patient and will notified CSW when she arrives. CSW will continue to follow for discharge planning.     Expected Discharge Plan: Home w Home Health Services Barriers to Discharge: Family Issues, Transportation  Expected Discharge Plan and Services Expected Discharge Plan: Home w Home Health Services   Discharge Planning Services: CM Consult Post Acute Care Choice: Home Health, Durable Medical Equipment   Expected Discharge Date: 01/24/19                   HH Arranged: RN, PT, Nurse's Aide HH Agency: [facility]   Social Determinants of Health (SDOH) Interventions    Readmission Risk Interventions No flowsheet data found.

## 2019-02-24 NOTE — Progress Notes (Signed)
OT Cancellation Note  Patient Details Name: Hulet Krampitz MRN: [id number] DOB: [birth year]   Cancelled Treatment:    Reason Eval/Treat Not Completed: Fatigue/lethargy limiting ability to participate. Pt again declined therapy this pm 2/2 fatigue. Pt noted to have recently worked with PT. Pt agreeable to OT tx next date.   Richrd Prime, MPH, MS, OTR/L ascom [phone] 02/24/19, 4:29 PM

## 2019-02-24 NOTE — Progress Notes (Addendum)
   [facility] - [place] at [facility]   PATIENT NAME: Maxwell Miller    MR#:  [id number]  DATE OF BIRTH:  Mar 12, 1961  No new changes   Patient is confused and disoriented at baseline, no events overnight per nursing staff, awaiting placement/guardianship per case management REVIEW OF SYSTEMS:  Review of Systems  Constitutional: Unable to provide due to confusion  DRUG ALLERGIES:  No Known Allergies  VITALS:  Blood pressure 131/89, pulse 70, temperature 97.9 F (36.6 C), temperature source Oral, resp. rate 18, height 5\' 11"  (1.803 m), weight 87.6 kg, SpO2 97 %.  PHYSICAL EXAMINATION:  Physical Exam   GENERAL:  58 y.o.-year-old patient lying in the bed with no acute distress.  EYES: Pupils equal, round, reactive to light and accommodation. No scleral icterus. Extraocular muscles intact.  HEENT: Head atraumatic, normocephalic. Oropharynx and nasopharynx clear.  NECK:  Supple, no jugular venous distention. No thyroid enlargement, no tenderness.  LUNGS: Normal breath sounds bilaterally, no wheezing, rales,rhonchi or crepitation. No use of accessory muscles of respiration.  CARDIOVASCULAR: S1, S2 normal. No murmurs, rubs, or gallops.  ABDOMEN: Soft, non tender  Bowel sounds present. No organomegaly or mass.  EXTREMITIES: No pedal edema, cyanosis, or clubbing.  NEUROLOGIC: Motor strength 5/5 in upper and lower extremities pSYCHIATRIC:patient is awake and alert SKIN: No obvious rash, lesion, or ulcer.    LABORATORY PANEL:   CBC Recent Labs  Lab 02/22/19 0356  WBC 7.7  HGB 14.3  HCT 42.9  PLT 152   ------------------------------------------------------------------------------------------------------------------  Chemistries  Recent Labs  Lab 02/19/19 0545  CREATININE 0.92   ------------------------------------------------------------------------------------------------------------------  Cardiac Enzymes No results for input(s): TROPONINI in the  last 168 hours. ------------------------------------------------------------------------------------------------------------------  RADIOLOGY:  No results found.  EKG:   Orders placed or performed during the hospital encounter of 01/20/19  . EKG 12-Lead  . EKG 12-Lead  . ED EKG 12-Lead  . ED EKG 12-Lead    ASSESSMENT AND PLAN:  58 year old male with history of intracranial hemorrhage x2, short-term memory loss and erythrocytosis who presents with generalized weakness, confusion and fever.  *Acute Sepsis likely due toCT suggestive of possible proctitis Resolved [name] in the blood on 01/20/19 Completed course of antibiotics with p.o. Bactrim, infectious disease did see patient while in house   *Confusion, short-term memory loss Had rapidly progressive dementia - ?  Underlying vascular dementia as well. 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  No acute findings or no features of meningitis seen. Seems to be at baseline neurologically Awaiting placement  *Acute depression Stable Continue Remeron  *Portal vein thrombosis Stable Continue eliquis - monitor closely especially given history of intraventricular hemorrhage in the past  *Generalized weakness Improving PT recommended home health  *Hypertension, chronic Continue Norvasc  Currently no family-in discussion with case management/social work-awaiting guardianship/placement, unsafe discharge at this time, APS notified   CODE STATUS: Full Code  TOTAL TIME TAKING CARE OF THIS PATIENT: 20 minutes.    Evelena Asa Lennie Dunnigan M.D on 02/24/2019 at 10:15 AM  Between 7am to 6pm - Pager - [phone]  After 6pm go to [email or web address] - Social research officer, government  [facility] [place] [facility]  Office  [phone]  CC: Primary care physician; Mable Paris, PA-C

## 2019-02-24 NOTE — Progress Notes (Signed)
OT Cancellation Note  Patient Details Name: Maxwell Miller MRN: [id number] DOB: [birth year]   Cancelled Treatment:    Reason Eval/Treat Not Completed: Patient declined, no reason specified. Pt politely declined OT this am 2/2 fatigue. Agreeable to OT coming back in pm for treatment.   Richrd Prime, MPH, MS, OTR/L ascom [phone] 02/24/19, 11:00 AM

## 2019-02-24 NOTE — Progress Notes (Signed)
Physical Therapy Treatment Patient Details Name: Maxwell Miller MRN: [id number] DOB: [birth year] Today's Date: 02/24/2019    History of Present Illness 58 yo male with onset of AMS wtih confusion and fever, sepsis was admitted and noted encephalopathy, has recent B thalamic hemorrhages.  PMHx:  portal vein thrombosis, CVA, HTN, erythrocytosis.  Pt had walked as far as 500 ft with PT this hosptialization, but has shown consistent decline in phyiscal and mental status.    PT Comments    Pt in bed watching TV upon entry, agreeable to participate. Max assist to EOB, similar also for return to supine. Pt dizzy initially at eOB which improves only mildly after 5-6 minutes. Pt tolerates sitting EOB >15 minutes with intermittent Left trunk support, verbal cues. Dizziness increases dramatically with transition to standing, pt closing eyes, wincing, BUE shaking. PT moved back to supine with quick improvement in dizziness. BP established during sitting, systolic in 120s. Pt is conversational throughout, tells bits and pieces of stories that are meaningful to home about his past, but pt has awareness of his memory impairment, and sounds remorseful at times that these recollections are incomplete and that his confidence is low regarding whether they are true or confabulated. He often presents with low level frustration when unable to answer a question due to his memory limitations. Unable to correlate any objective data with his c/o dizziness. PLOF limited at this time, unable to find any consults from neuro or psych to speak to his dramatic functional decline over the past 2-3 weeks. These may provide some insight in rehab prognosis as well as fine tuning in strategies with which to best interact with the patient.     Follow Up Recommendations  SNF     Equipment Recommendations  Wheelchair (measurements PT);Wheelchair cushion (measurements PT);3in1 (PT)    Recommendations for Other Services        Precautions / Restrictions Precautions Precautions: Fall Restrictions Weight Bearing Restrictions: No    Mobility  Bed Mobility Overal bed mobility: Needs Assistance Bed Mobility: Supine to Sit;Sit to Supine     Supine to sit: Max assist;+2 for physical assistance Sit to supine: Max assist   General bed mobility comments: needs assist with BLE as well as trunk; minimal help with BUE to pull trunk into flexion   Transfers Overall transfer level: Needs assistance Equipment used: 1 person hand held assist Transfers: Sit to/from Stand Sit to Stand: Max assist         General transfer comment: MaxA to rise, then with minA able to establish upright stance, but with sudden increased dizziness, wincing, eyes closes, BUE shaking. Returned to supine after 5-6 seconds   Ambulation/Gait                 Stairs             Wheelchair Mobility    Modified Rankin (Stroke Patients Only)       Balance Overall balance assessment: Needs assistance Sitting-balance support: Feet supported Sitting balance-Leahy Scale: Poor Sitting balance - Comments: sits at EOB x15+ minutes, intermittnet minA at Left shoulder/trunk for support/postural cuing.                                     Cognition Arousal/Alertness: Awake/alert Behavior During Therapy: WFL for tasks assessed/performed Overall Cognitive Status: No family/caregiver present to determine baseline cognitive functioning  Exercises      General Comments        Pertinent Vitals/Pain Pain Assessment: No/denies pain Faces Pain Scale: Hurts a little bit Pain Location: reports some discomfort in the anterior Rt hip joint when sitting at EOB.  Pain Descriptors / Indicators: Discomfort(word finding difficulty trying to describe ) Pain Intervention(s): Limited activity within patient's tolerance    Home Living                      Prior  Function            PT Goals (current goals can now be found in the care plan section) Acute Rehab PT Goals Patient Stated Goal: to cook again PT Goal Formulation: With patient Time For Goal Achievement: 02/27/19 Potential to Achieve Goals: Fair Progress towards PT goals: Progressing toward goals    Frequency    Min 2X/week      PT Plan Current plan remains appropriate    Co-evaluation              AM-PAC PT "6 Clicks" Mobility   Outcome Measure  Help needed turning from your back to your side while in a flat bed without using bedrails?: A Lot Help needed moving from lying on your back to sitting on the side of a flat bed without using bedrails?: A Lot Help needed moving to and from a bed to a chair (including a wheelchair)?: A Lot Help needed standing up from a chair using your arms (e.g., wheelchair or bedside chair)?: A Lot Help needed to walk in hospital room?: Total Help needed climbing 3-5 steps with a railing? : Total 6 Click Score: 10    End of Session   Activity Tolerance: Patient limited by fatigue Patient left: in bed;with bed alarm set;with call bell/phone within reach Nurse Communication: Mobility status;Other (comment)(Pt needs a neuro and/or pysch consult to help) PT Visit Diagnosis: Unsteadiness on feet (R26.81);Other abnormalities of gait and mobility (R26.89);Adult, failure to thrive (R62.7)     Time: 2035-5974 PT Time Calculation (min) (ACUTE ONLY): 30 min  Charges:  $Therapeutic Activity: 23-37 mins                     4:27 PM, 02/24/19 [name], PT, DPT Physical Therapist - [facility]  [phone] (ASCOM)     Lalani Winkles C 02/24/2019, 4:20 PM

## 2019-02-25 NOTE — Progress Notes (Signed)
   [facility] [place] [facility]   PATIENT NAME: Maxwell Miller    MR#:  [id number]  DATE OF BIRTH:  08-Jul-1961  No new changes   Patient is confused and disoriented at baseline, no events overnight per nursing staff, awaiting placement/guardianship per case management REVIEW OF SYSTEMS:  Review of Systems  Constitutional: Unable to provide due to confusion  DRUG ALLERGIES:  No Known Allergies  VITALS:  Blood pressure 118/86, pulse 74, temperature 98.1 F (36.7 C), temperature source Oral, resp. rate 14, height 5\' 11"  (1.803 m), weight 87.6 kg, SpO2 96 %.  PHYSICAL EXAMINATION:  Physical Exam   GENERAL:  58 y.o.-year-old patient lying in the bed with no acute distress.  EYES: Pupils equal, round, reactive to light and accommodation. No scleral icterus. Extraocular muscles intact.  HEENT: Head atraumatic, normocephalic. Oropharynx and nasopharynx clear.  NECK:  Supple, no jugular venous distention. No thyroid enlargement, no tenderness.  LUNGS: Normal breath sounds bilaterally, no wheezing, rales,rhonchi or crepitation. No use of accessory muscles of respiration.  CARDIOVASCULAR: S1, S2 normal. No murmurs, rubs, or gallops.  ABDOMEN: Soft, non tender  Bowel sounds present. No organomegaly or mass.  EXTREMITIES: No pedal edema, cyanosis, or clubbing.  NEUROLOGIC: Motor strength 5/5 in upper and lower extremities pSYCHIATRIC:patient is awake and alert SKIN: No obvious rash, lesion, or ulcer.    LABORATORY PANEL:   CBC Recent Labs  Lab 02/22/19 0356  WBC 7.7  HGB 14.3  HCT 42.9  PLT 152   ------------------------------------------------------------------------------------------------------------------  Chemistries  Recent Labs  Lab 02/19/19 0545  CREATININE 0.92   ------------------------------------------------------------------------------------------------------------------  Cardiac Enzymes No results for input(s): TROPONINI in the  last 168 hours. ------------------------------------------------------------------------------------------------------------------  RADIOLOGY:  No results found.  EKG:   Orders placed or performed during the hospital encounter of 01/20/19  . EKG 12-Lead  . EKG 12-Lead  . ED EKG 12-Lead  . ED EKG 12-Lead    ASSESSMENT AND PLAN:  58 year old male with history of intracranial hemorrhage x2, short-term memory loss and erythrocytosis who presents with generalized weakness, confusion and fever.  *Acute Sepsis likely due toCT suggestive of possible proctitis Resolved [name] in the blood on 01/20/19 Completed course of antibiotics with p.o. Bactrim, infectious disease did see patient while in house   *Confusion, short-term memory loss Had rapidly progressive dementia - ?  Underlying vascular dementia as well. 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  No acute findings or no features of meningitis seen. Seems to be at baseline neurologically Awaiting placement  *Acute depression Stable Continue Remeron  *Portal vein thrombosis Stable Continue eliquis - monitor closely especially given history of intraventricular hemorrhage in the past  *Generalized weakness Improving PT recommended home health  *Hypertension, chronic Continue Norvasc  Currently no family-in discussion with case management/social work-awaiting guardianship/placement, unsafe discharge at this time, APS notified   CODE STATUS: Full Code  TOTAL TIME TAKING CARE OF THIS PATIENT: 20 minutes.    Evelena Asa Milisa Kimbell M.D on 02/25/2019 at 1:41 PM  Between 7am to 6pm - Pager - [phone]  After 6pm go to [email or web address] - Social research officer, government  [facility]  Office  [phone]  CC: Primary care physician; Mable Paris, PA-C

## 2019-02-25 NOTE — Progress Notes (Signed)
Physical Therapy Treatment Patient Details Name: Maxwell Miller Baucum MRN: [id number] DOB: 1960-12-05 Today's Date: 02/25/2019    History of Present Illness Maxwell Miller Gent is a 58 yo male [facility] c AMS, confusion, and fever, admitted with sepsis and related encephalopathy. 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 Pt showed good improvement in language fluency working with SLP, but memory and attention deficits persisted despite OT services. PTA pt was living with his spouse, AMB freely without assistive device or difficulty, but needed assistance with ADL/IADL and close supervision due to lability and severe ST/LT memory impairment. [facility], pt hd also made good progress working with psychiatry.  PMHx: portal vein thrombosis, CVA, HTN, erythrocytosis.     PT Comments    Pt received in bed, awake in a dark room watching TV, agreeable to participate. Pt reporting today that he is unable to move his Right leg, but that it 'moves on its own' sometimes. Of note, yesterday mentioned having no sensation in his Right lower leg. Othos established as negative. Upon initial standing, pt begins mildly shaking in arms, reports 'my head isn't doing something right,' pt appearing less alert than prior to standing. Second standing attempt patient, pt begins to shake a moderately in arms and trunk, difficulty maintaining eyes open, after 15 seconds he reports "I think I am going to pass out." Pt returned to sitting. VC to maintain eyes open, which he does intermittently, pt verbally responsive throughout, no LOC noted. MinA required for trunk support after patient begins to collapse into retropulsion while seated. After 1 minute, pt transferred to chair quickly to allow feet up and slight recline. Pt objectively appearing  more alert and calm at end of session, but pt's report is largely ambiguous/unhelpful, without surprise, as pt is often vague in report due to memory impairment and residual language deficits. RN notified. Pt still requires TotalA for bed mobility, MaxA for STS and Stand pivot transfers.     Follow Up Recommendations  SNF     Equipment Recommendations  Wheelchair (measurements PT);Wheelchair cushion (measurements PT);3in1 (PT)    Recommendations for Other Services       Precautions / Restrictions Precautions Precautions: Fall Restrictions Weight Bearing Restrictions: No    Mobility  Bed Mobility Overal bed mobility: Needs Assistance Bed Mobility: Supine to Sit;Sit to Supine     Supine to sit: Max assist;+2 for physical assistance     General bed mobility comments: Pt reports he is unable to produce any movement in RLE, that it moves 'on its own'.  Transfers Overall transfer level: Needs assistance Equipment used: (none, dependence style transfer; pt cued to grasp authors elbows for support) Transfers: Sit to/from Stand Sit to Stand: Mod assist   Squat pivot transfers: Max assist(ASsisted to chair as easier to elevate legs and recline trunk)     General transfer comment: Performed 2x15 seconds. 1st time attempting standing BP (unsuccessful) then estblaished immediately upon sitting. 2nd time, pt begins shaking again, difficulty keeping eyes open, then says I feel like I am going to pass out.    Ambulation/Gait Ambulation/Gait assistance: (not appropriate at this time. )               Stairs             Wheelchair Mobility    Modified Rankin (Stroke Patients Only)  Balance Overall balance assessment: Needs assistance Sitting-balance support: Feet supported Sitting balance-Leahy Scale: Good(SIts EOB x 5 minutes without trunk support. Does need some trunk support after presyncopal episode)   Postural control: Posterior lean Standing balance  support: Bilateral upper extremity supported(Rt knee block )                                Cognition Arousal/Alertness: Awake/alert Behavior During Therapy: WFL for tasks assessed/performed Overall Cognitive Status: History of cognitive impairments - at baseline Area of Impairment: Following commands                       Following Commands: Follows one step commands consistently       General Comments: Baseline heavy ST/LT memory impairment. Pt also has a history of aphasia, now still seems to struggle with language in describing his symptoms and complaints. (Pt seems depressed. Reports he is upseat that he is not able to walk, that is he unable to leave. )      Exercises      General Comments        Pertinent Vitals/Pain Pain Assessment: No/denies pain    Home Living                      Prior Function            PT Goals (current goals can now be found in the care plan section) Acute Rehab PT Goals Patient Stated Goal: to cook again PT Goal Formulation: With patient Time For Goal Achievement: 02/27/19 Potential to Achieve Goals: Fair Progress towards PT goals: Progressing toward goals    Frequency    Min 2X/week      PT Plan Current plan remains appropriate    Co-evaluation              AM-PAC PT "6 Clicks" Mobility   Outcome Measure  Help needed turning from your back to your side while in a flat bed without using bedrails?: A Lot Help needed moving from lying on your back to sitting on the side of a flat bed without using bedrails?: A Lot Help needed moving to and from a bed to a chair (including a wheelchair)?: A Lot Help needed standing up from a chair using your arms (e.g., wheelchair or bedside chair)?: A Lot Help needed to walk in hospital room?: Total Help needed climbing 3-5 steps with a railing? : Total 6 Click Score: 10    End of Session Equipment Utilized During Treatment: Gait belt Activity  Tolerance: Patient limited by fatigue Patient left: with call bell/phone within reach;in chair;with chair alarm set Nurse Communication: Mobility status;Other (comment)(pt presyncopal during standing) PT Visit Diagnosis: Unsteadiness on feet (R26.81);Other abnormalities of gait and mobility (R26.89);Adult, failure to thrive (R62.7)     Time: 0920-0943 PT Time Calculation (min) (ACUTE ONLY): 23 min  Charges:  $Therapeutic Exercise: 23-37 mins                     10:23 AM, 02/25/19 [name], PT, DPT Physical Therapist - [facility]  [phone] (ASCOM)     , C 02/25/2019, 10:10 AM

## 2019-02-25 NOTE — TOC Progression Note (Signed)
Transition of Care [facility]) - Progression Note    Patient Details  Name: Maxwell Miller MRN: [id number] Date of Birth: 1961-02-20  Transition of Care [facility] CM/SW Contact  Ruthe Mannan, [place] Phone Number: 02/25/2019, 2:28 PM  Clinical Narrative:   APS worker Arthur Holms came to meet with patient today to work on guardianship and placement. CSW provided MD letter for her for guardianship. CSW will continue to follow for discharge planning.     Expected Discharge Plan: Home w Home Health Services Barriers to Discharge: Family Issues, Transportation  Expected Discharge Plan and Services Expected Discharge Plan: Home w Home Health Services   Discharge Planning Services: CM Consult Post Acute Care Choice: Home Health, Durable Medical Equipment   Expected Discharge Date: 01/24/19                   HH Arranged: RN, PT, Nurse's Aide HH Agency: [facility]   Social Determinants of Health (SDOH) Interventions    Readmission Risk Interventions No flowsheet data found.

## 2019-02-25 NOTE — Progress Notes (Signed)
Nutrition Follow-up  RD working remotely.  DOCUMENTATION CODES:   Non-severe (moderate) malnutrition in context of social or environmental circumstances  INTERVENTION:  Continue Ensure Enlive po BID, each supplement provides 350 kcal and 20 grams of protein.  NUTRITION DIAGNOSIS:   Moderate Malnutrition related to social / environmental circumstances as evidenced by mild fat depletion, moderate fat depletion, mild muscle depletion, moderate muscle depletion.  Ongoing.  GOAL:   Patient will meet greater than or equal to 90% of their needs  Progressing.  MONITOR:   PO intake, Supplement acceptance, Weight trends, I & O's  REASON FOR ASSESSMENT:   LOS    ASSESSMENT:   58 year old male who presented to the ED on 3/10 with AMS. PMH of stroke, HTN, memory loss. Pt admitted with sepsis.  Patient continues to eat well. He is finishing 80-100% of his meals. He continues to drink two bottles of Ensure Enlive per day. Still pending guardianship and placement.  Medications reviewed and include: Eliquis, Colace 100 mg BID, folic acid 1 mg daily, Remeron 15 mg QHS, MVI daily, Flomax, thiamine 100 mg daily.  Labs reviewed.  Diet Order:   Diet Order            Diet - low sodium heart healthy        Diet regular Room service appropriate? Yes; Fluid consistency: Thin  Diet effective now             EDUCATION NEEDS:   Education needs have been addressed  Skin:  Skin Assessment: Reviewed RN Assessment  Last BM:  02/22/2019 - large type 4  Height:   Ht Readings from Last 1 Encounters:  01/20/19 5\' 11"  (1.803 m)   Weight:   Wt Readings from Last 1 Encounters:  02/12/19 87.6 kg   Ideal Body Weight:  78.2 kg  BMI:  Body mass index is 26.93 kg/m.  Estimated Nutritional Needs:   Kcal:  2150-2350  Protein:  110-125 grams  Fluid:  >/= 2.2 L  Helane Rima, MS, RD, LDN Office: [phone] Pager: [phone] After Hours/Weekend Pager: [phone]

## 2019-02-25 NOTE — Progress Notes (Signed)
Occupational Therapy Treatment Patient Details Name: Maxwell Miller MRN: [id number] DOB: [birth year] Today's Date: 02/25/2019    History of present illness Maxwell Miller is a 58 yo male who [facility] c AMS, confusion, and fever, admitted with sepsis and related encephalopathy. 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 Pt showed good improvement in language fluency working with SLP, but memory and attention deficits persisted despite OT services. PTA pt was living with his spouse, AMB freely without assistive device or difficulty, but needed assistance with ADL/IADL and close supervision due to lability and severe ST/LT memory impairment. [facility], pt hd also made good progress working with psychiatry.  PMHx: portal vein thrombosis, CVA, HTN, erythrocytosis.    OT comments  Pt seen for OT tx this date. Eager to participate, as he was looking forward to getting back to bed from the recliner. With cues and Miin-mod A +2 with RW for STS and SPT, pt able to get EOB (more difficulty with RLE than LLE). Some physical assist for weight shifting during transfer. Pt required Min A for RLE mgt back to bed, but otherwise was able to perform. Pt left with urinal for toileting task, per pt's request. Pt continues to benefit from skilled OT services. STR remains appropriate.    Follow Up Recommendations  SNF;Supervision/Assistance - 24 hour    Equipment Recommendations       Recommendations for Other Services      Precautions / Restrictions Precautions Precautions: Fall Restrictions Weight Bearing Restrictions: No       Mobility Bed Mobility Overal bed mobility: Needs Assistance Bed Mobility: Sit to Supine       Sit to supine: Min assist   General bed mobility comments: Min A for RLE mgt back to bed  and cues to initiate  Transfers Overall transfer level: Needs assistance Equipment used: Rolling walker (2 wheeled) Transfers: Sit to/from [employer] CorporationStand;Stand Pivot Transfers Sit to Stand: Mod assist;+2 physical assistance;Min assist Stand pivot transfers: Mod assist;+2 physical assistance       General transfer comment: cues for sequencing, hand/foot placement, occasional tactile cues as well    Balance Overall balance assessment: Needs assistance Sitting-balance support: Feet supported Sitting balance-Leahy Scale: Good   Postural control: Posterior lean Standing balance support: Bilateral upper extremity supported Standing balance-Leahy Scale: Poor                             ADL either performed or assessed with clinical judgement   ADL                                               Vision Patient Visual Report: No change from baseline     Perception     Praxis      Cognition Arousal/Alertness: Awake/alert Behavior During Therapy: WFL for tasks assessed/performed Overall Cognitive Status: History of cognitive impairments - at baseline Area of Impairment: Following commands;Problem solving                       Following Commands: Follows one step commands consistently;Follows multi-step commands inconsistently     Problem Solving: Slow processing;Requires verbal cues;Requires tactile cues;Difficulty sequencing  Exercises     Shoulder Instructions       General Comments      Pertinent Vitals/ Pain       Pain Assessment: No/denies pain Pain Intervention(s): Monitored during session  Home Living                                          Prior Functioning/Environment              Frequency  Min 1X/week        Progress Toward Goals  OT Goals(current goals can now be found in the care plan section)  Progress towards OT goals: Progressing toward goals  Acute Rehab OT Goals Patient  Stated Goal: to cook again OT Goal Formulation: With patient Time For Goal Achievement: 03/04/19 Potential to Achieve Goals: Good  Plan Discharge plan remains appropriate;Frequency needs to be updated    Co-evaluation                 AM-PAC OT "6 Clicks" Daily Activity     Outcome Measure   Help from another person eating meals?: None Help from another person taking care of personal grooming?: None Help from another person toileting, which includes using toliet, bedpan, or urinal?: A Lot Help from another person bathing (including washing, rinsing, drying)?: A Lot Help from another person to put on and taking off regular upper body clothing?: A Lot Help from another person to put on and taking off regular lower body clothing?: A Lot 6 Click Score: 16    End of Session Equipment Utilized During Treatment: Gait belt;Rolling walker  OT Visit Diagnosis: Other abnormalities of gait and mobility (R26.89);Muscle weakness (generalized) (M62.81)   Activity Tolerance Patient tolerated treatment well   Patient Left in bed;with call bell/phone within reach;with bed alarm set   Nurse Communication          Time: 9150-5697 OT Time Calculation (min): 13 min  Charges: OT General Charges $OT Visit: 1 Visit OT Treatments $Therapeutic Activity: 8-22 mins  Richrd Prime, MPH, MS, OTR/L ascom [phone] 02/25/19, 4:23 PM

## 2019-02-25 NOTE — Significant Event (Addendum)
During Physical Therapy session this date:   Othostatic vitals established as negative. Upon initial standing, pt begins mildly shaking in arms, reports 'my head isn't doing something right,' pt appearing less alert than prior to standing. Second standing attempt patient, pt begins to shake a moderately in arms and trunk, difficulty maintaining eyes open, after 15 seconds he reports "I think I am going to pass out." Pt returned to sitting. VC to maintain eyes open, which he does intermittently, pt verbally responsive throughout, no LOC noted. MinA required for trunk support after patient begins to collapse into retropulsion while seated. After 1 minute, pt transferred to chair quickly to allow feet up and slight recline. Pt objectively appearing more alert and calm at end of session. RN made aware of situation as described above, immediately after exiting room.     02/25/19 0924  Orthostatic Lying   BP- Lying 124/79  Pulse- Lying 77  Orthostatic Sitting  BP- Sitting 128/87  Pulse- Sitting 80  Orthostatic Standing at 0 minutes  BP- Standing at 0 minutes 133/87  Pulse- Standing at 0 minutes 77     10:24 AM, 02/25/19 [name], PT, DPT Physical Therapist - [facility]  [phone] [facility])    italicized.   Addendum on 02/26/19 to clarify communication with medical team after this event. Addendum is italicized.   2:39 PM, 02/26/19 [name], PT, DPT Physical Therapist - [facility]  [phone] [address])

## 2019-02-26 LAB — CBC
HCT: 42.4 % (ref 39.0–52.0)
Hemoglobin: 14.2 g/dL (ref 13.0–17.0)
MCH: 29.6 pg (ref 26.0–34.0)
MCHC: 33.5 g/dL (ref 30.0–36.0)
MCV: 88.5 fL (ref 80.0–100.0)
Platelets: 158 10*3/uL (ref 150–400)
RBC: 4.79 MIL/uL (ref 4.22–5.81)
RDW: 16.2 % — ABNORMAL HIGH (ref 11.5–15.5)
WBC: 8.4 10*3/uL (ref 4.0–10.5)
nRBC: 0.2 % (ref 0.0–0.2)

## 2019-02-26 LAB — CREATININE, SERUM
Creatinine, Ser: 1.06 mg/dL (ref 0.61–1.24)
GFR calc Af Amer: >60 mL/min (ref >60)
GFR calc non Af Amer: >60 mL/min (ref >60)

## 2019-02-26 NOTE — Progress Notes (Signed)
   [employer] - [place] at [facility]   PATIENT NAME: Maxwell Miller    MR#:  [id number]  DATE OF BIRTH:  12/09/1960  No new changes   Patient is confused and disoriented at baseline, no events overnight per nursing staff, awaiting placement/guardianship per case management REVIEW OF SYSTEMS:  Review of Systems  Constitutional: Unable to provide due to confusion  DRUG ALLERGIES:  No Known Allergies  VITALS:  Blood pressure 127/89, pulse 77, temperature 98.2 F (36.8 C), temperature source Oral, resp. rate 15, height 5\' 11"  (1.803 m), weight 87.6 kg, SpO2 97 %.  PHYSICAL EXAMINATION:  Physical Exam   GENERAL:  58 y.o.-year-old patient lying in the bed with no acute distress.  EYES: Pupils equal, round, reactive to light and accommodation. No scleral icterus. Extraocular muscles intact.  HEENT: Head atraumatic, normocephalic. Oropharynx and nasopharynx clear.  NECK:  Supple, no jugular venous distention. No thyroid enlargement, no tenderness.  LUNGS: Normal breath sounds bilaterally, no wheezing, rales,rhonchi or crepitation. No use of accessory muscles of respiration.  CARDIOVASCULAR: S1, S2 normal. No murmurs, rubs, or gallops.  ABDOMEN: Soft, non tender  Bowel sounds present. No organomegaly or mass.  EXTREMITIES: No pedal edema, cyanosis, or clubbing.  NEUROLOGIC: Motor strength 5/5 in upper and lower extremities pSYCHIATRIC:patient is awake and alert SKIN: No obvious rash, lesion, or ulcer.    LABORATORY PANEL:   CBC Recent Labs  Lab 02/26/19 0253  WBC 8.4  HGB 14.2  HCT 42.4  PLT 158   ------------------------------------------------------------------------------------------------------------------  Chemistries  Recent Labs  Lab 02/26/19 0253  CREATININE 1.06   ------------------------------------------------------------------------------------------------------------------  Cardiac Enzymes No results for input(s): TROPONINI in the  last 168 hours. ------------------------------------------------------------------------------------------------------------------  RADIOLOGY:  No results found.  EKG:   Orders placed or performed during the hospital encounter of 01/20/19  . EKG 12-Lead  . EKG 12-Lead  . ED EKG 12-Lead  . ED EKG 12-Lead    ASSESSMENT AND PLAN:  58 year old male with history of intracranial hemorrhage x2, short-term memory loss and erythrocytosis who presents with generalized weakness, confusion and fever.  *Acute Sepsis likely due toCT suggestive of possible proctitis Resolved [name] in the blood on 01/20/19 Completed course of antibiotics with p.o. Bactrim, infectious disease did see patient while in house   *Confusion, short-term memory loss Had rapidly progressive dementia - ?  Underlying vascular dementia as well. 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  No acute findings or no features of meningitis seen. Seems to be at baseline neurologically Awaiting placement  *Acute depression Stable Continue Remeron  *Portal vein thrombosis Stable Continue eliquis - monitor closely especially given history of intraventricular hemorrhage in the past  *Generalized weakness Improving PT recommended home health  *Hypertension, chronic Continue Norvasc  Currently no family-in discussion with case management/social work-awaiting guardianship/placement, unsafe discharge at this time, APS notified   CODE STATUS: Full Code  TOTAL TIME TAKING CARE OF THIS PATIENT: 20 minutes.    Evelena Asa  M.D on 02/26/2019 at 10:42 AM  Between 7am to 6pm - Pager - [phone]  After 6pm go to [email or web address] - Social research officer, government  [facility] [place] [facility]  Office  [phone]  CC: Primary care physician; Mable Paris, PA-C

## 2019-02-26 NOTE — Plan of Care (Signed)
  Problem: Skin Integrity: Goal: Risk for impaired skin integrity will decrease Outcome: Progressing   Problem: Activity: Goal: Risk for activity intolerance will decrease Outcome: Progressing   Problem: Safety: Goal: Ability to remain free from injury will improve Outcome: Progressing   Problem: Skin Integrity: Goal: Risk for impaired skin integrity will decrease Outcome: Progressing

## 2019-02-26 NOTE — Progress Notes (Signed)
Physical Therapy Treatment Patient Details Name: Maxwell Miller MRN: [id number] DOB: October 14, 1961 Today's Date: 02/26/2019    History of Present Illness Maxwell Miller is a 58 yo male who comes to [facility] on 01/20/19 c AMS, confusion, and fever, admitted with sepsis and related encephalopathy. 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 Pt showed good improvement in language fluency working with SLP, but memory and attention deficits persisted despite OT services. PTA pt was living with his spouse, AMB freely without assistive device or difficulty, but needed assistance with ADL/IADL and close supervision due to lability and severe ST/LT memory impairment. Per neuro notes at [facility], pt hd also made good progress working with psychiatry.  PMHx: portal vein thrombosis, CVA, HTN, erythrocytosis.     PT Comments    Session focus on RLE evaluation and exercise after last two sessions pt c/o numbness and paresis. Pt with uniform strength levels at ankle, knee, and hip. Bed level exercises requiring minA at large, pt able to perform 75% of work needed, but questionable if authors assistance serves more as tactile cuing than 2/2 actually physiologic fatigue. Pt given cues for resistance during exercise, but provided resistance seems to create more inhibition, rather than additional effort from patient. Discomfort in hip and knee improve with ROM, however pt continues to have some discomfort in the foreleg and ankle. Pt remains conversational throughout. 2/2 baseline memory impairment, [name] takes time to explain to patient the progress he has made with PT over the previous 2 days, which he is pleased to hear. Pt speaks at length about how disheartening it is to have dreams when he sleeps about AMB and going to different places, and then to awaken to the  reality of being unable to AMB and stuck in the same place. Pt given empathic support and encouraged to continue to work toward return to his baseline mobility.   Follow Up Recommendations  SNF     Equipment Recommendations  Wheelchair (measurements PT);Wheelchair cushion (measurements PT);3in1 (PT)    Recommendations for Other Services       Precautions / Restrictions Precautions Precautions: Fall Restrictions Weight Bearing Restrictions: No    Mobility  Bed Mobility Overal bed mobility: Needs Assistance             General bed mobility comments: Verbal cues and supervision/set up for pulling self up in bed.   Transfers Overall transfer level: (deferred at this time to better evaluate RLE complaints from 2 previous dates)                  Ambulation/Gait                 Stairs             Wheelchair Mobility    Modified Rankin (Stroke Patients Only)       Balance                                            Cognition Arousal/Alertness: Awake/alert Behavior During Therapy: WFL for tasks assessed/performed Overall Cognitive Status: History of cognitive impairments - at baseline Area of Impairment: (Memory impairment at baseline 2/2 CVAx2)  Following Commands: Follows one step commands consistently     Problem Solving: Slow processing;Requires tactile cues;Decreased initiation        Exercises General Exercises - Lower Extremity Ankle Circles/Pumps: AROM;Right;20 reps;Supine;AAROM(Activae assistance given for cues) Short Arc Quad: AAROM;15 reps;Right Heel Slides: Right;15 reps;Supine;AAROM Hip ABduction/ADduction: AAROM;Right;15 reps Hip Flexion/Marching: AAROM;Right;10 reps;Supine(Performed from a hooklying position: )    General Comments        Pertinent Vitals/Pain Pain Assessment: Faces Faces Pain Scale: Hurts little more Pain Location: Reporting some dyscomfort at the  anterior ankle, medial middiaphyseal tibia, and some calf discomfort on Right. (Pt does not rate. ) Pain Descriptors / Indicators: Discomfort(limited by midl residual aphsia) Pain Intervention(s): Monitored during session    Home Living                      Prior Function            PT Goals (current goals can now be found in the care plan section) Acute Rehab PT Goals Patient Stated Goal: Return to walking and being able to go places PT Goal Formulation: With patient Time For Goal Achievement: 02/27/19 Potential to Achieve Goals: Fair Progress towards PT goals: Progressing toward goals    Frequency    Min 2X/week      PT Plan Current plan remains appropriate    Co-evaluation              AM-PAC PT "6 Clicks" Mobility   Outcome Measure  Help needed turning from your back to your side while in a flat bed without using bedrails?: A Lot Help needed moving from lying on your back to sitting on the side of a flat bed without using bedrails?: A Lot Help needed moving to and from a bed to a chair (including a wheelchair)?: A Lot Help needed standing up from a chair using your arms (e.g., wheelchair or bedside chair)?: A Lot Help needed to walk in hospital room?: Total Help needed climbing 3-5 steps with a railing? : Total 6 Click Score: 10    End of Session   Activity Tolerance: Patient limited by fatigue Patient left: in bed;with bed alarm set;with call bell/phone within reach Nurse Communication: Mobility status PT Visit Diagnosis: Unsteadiness on feet (R26.81);Other abnormalities of gait and mobility (R26.89);Adult, failure to thrive (R62.7)     Time: [id number] PT Time Calculation (min) (ACUTE ONLY): 30 min  Charges:  $Therapeutic Exercise: 23-37 mins                     7:12 PM, 02/26/19 Rosamaria LintsAllan C Francene Mcerlean, PT, DPT Physical Therapist - [facility]  [phone] (ASCOM)     Brynda Heick C 02/26/2019, 7:04  PM

## 2019-02-27 LAB — GLUCOSE, CAPILLARY: Glucose-Capillary: 110 mg/dL — ABNORMAL HIGH (ref 70–99)

## 2019-02-27 NOTE — Progress Notes (Signed)
PT Cancellation Note  Patient Details Name: Maxwell Miller MRN: [id number] DOB: 01/22/1961   Cancelled Treatment:    Reason Eval/Treat Not Completed: Patient declined, no reason specified(requests PT come back later) Patient sleeping upon PT arrival. Woke to verbal stimuli, not pleased that PT was in room, requests PT return at later time. Will attempt again at later time/date.   Precious Bard, PT, DPT   02/27/2019, 10:22 AM

## 2019-02-27 NOTE — Progress Notes (Signed)
Physical Therapy Treatment Patient Details Name: Maxwell Miller MRN: [id number] DOB: Apr 06, 1961 Today's Date: 02/27/2019    History of Present Illness Maxwell Miller is a 58 yo male who comes to [facility] on 01/20/19 c AMS, confusion, and fever, admitted with sepsis and related encephalopathy. 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 Maxwell Miller showed good improvement in language fluency working with SLP, but memory and attention deficits persisted despite OT services. PTA Maxwell Miller was living with his spouse, AMB freely without assistive device or difficulty, but needed assistance with ADL/IADL and close supervision due to lability and severe ST/LT memory impairment. Per neuro notes at [facility], Maxwell Miller hd also made good progress working with psychiatry.  PMHx: portal vein thrombosis, CVA, HTN, erythrocytosis.     Maxwell Miller Comments    Patient in bed upon Maxwell Miller arrival, declined out of bed mobility but agreeable to supine interventions. Patient requires active assistive ROM for RLE with improved muscle recruitment with tactile tapping for contraction. Patient requires cueing for task orientation and encouragement for participation. Current POC remains appropriate at this time.     Follow Up Recommendations  SNF     Equipment Recommendations  Wheelchair (measurements Maxwell Miller);Wheelchair cushion (measurements Maxwell Miller);3in1 (Maxwell Miller)    Recommendations for Other Services       Precautions / Restrictions Precautions Precautions: Fall Restrictions Weight Bearing Restrictions: No    Mobility  Bed Mobility Overal bed mobility: Needs Assistance             General bed mobility comments: Verbal cues and supervision/set up for pulling self up in bed.   Transfers Overall transfer level: (refusing out of bed)                  Ambulation/Gait                  Stairs             Wheelchair Mobility    Modified Rankin (Stroke Patients Only)       Balance                                            Cognition Arousal/Alertness: Awake/alert Behavior During Therapy: WFL for tasks assessed/performed Overall Cognitive Status: History of cognitive impairments - at baseline Area of Impairment: (Memory impairment at baseline 2/2 CVAx2)                       Following Commands: Follows one step commands consistently Safety/Judgement: Decreased awareness of safety   Problem Solving: Slow processing;Requires tactile cues;Decreased initiation General Comments: Baseline heavy ST/LT memory impairment. Maxwell Miller also has a history of aphasia, now still seems to struggle with language in describing his symptoms and complaints.       Exercises General Exercises - Lower Extremity Ankle Circles/Pumps: AROM;Right;20 reps;Supine;AAROM(Activae assistance given for cues) Short Arc Quad: AAROM;15 reps;Right Heel Slides: Right;15 reps;Supine;AAROM Hip ABduction/ADduction: AAROM;Right;15 reps Hip Flexion/Marching: AAROM;Right;10 reps;Supine(Performed from a hooklying position: )    General Comments        Pertinent Vitals/Pain Pain Assessment: No/denies pain(difficulty feeling RLE)    Home Living                      Prior Function  Maxwell Miller Goals (current goals can now be found in the care plan section) Acute Rehab Maxwell Miller Goals Patient Stated Goal: Return to walking and being able to go places Maxwell Miller Goal Formulation: With patient Time For Goal Achievement: 02/27/19 Potential to Achieve Goals: Fair Progress towards Maxwell Miller goals: Progressing toward goals(refused OOB, only did supine)    Frequency    Min 2X/week      Maxwell Miller Plan Current plan remains appropriate    Co-evaluation              AM-PAC Maxwell Miller "6 Clicks" Mobility   Outcome Measure  Help needed turning from your back to your side while in a  flat bed without using bedrails?: A Lot Help needed moving from lying on your back to sitting on the side of a flat bed without using bedrails?: A Lot Help needed moving to and from a bed to a chair (including a wheelchair)?: A Lot Help needed standing up from a chair using your arms (e.g., wheelchair or bedside chair)?: A Lot Help needed to walk in hospital room?: Total Help needed climbing 3-5 steps with a railing? : Total 6 Click Score: 10    End of Session   Activity Tolerance: Patient limited by fatigue;Other (comment)(lack of patient desire to get out of bed) Patient left: in bed;with bed alarm set;with call bell/phone within reach Nurse Communication: Mobility status;Other (comment)(exercises performed in supine) Maxwell Miller Visit Diagnosis: Unsteadiness on feet (R26.81);Other abnormalities of gait and mobility (R26.89);Adult, failure to thrive (R62.7)     Time: [id number] Maxwell Miller Time Calculation (min) (ACUTE ONLY): 19 min  Charges:  $Therapeutic Exercise: 8-22 mins                     Precious BardMarina Reilynn Lauro, Maxwell Miller, DPT     Precious BardMarina Terrisa Curfman 02/27/2019, 4:53 PM

## 2019-02-27 NOTE — Progress Notes (Addendum)
   [employer] - [place] at [facility]   PATIENT NAME: Maxwell Miller    MR#:  [id number]  DATE OF BIRTH:  [birth year]  No new changes   Patient is confused and disoriented at baseline, no events overnight per nursing staff, awaiting placement/guardianship per case management REVIEW OF SYSTEMS:  Review of Systems  Constitutional: Unable to provide due to confusion  DRUG ALLERGIES:  No Known Allergies  VITALS:  Blood pressure 122/86, pulse 73, temperature 97.7 F (36.5 C), temperature source Oral, resp. rate 18, height 5\' 11"  (1.803 m), weight 87.6 kg, SpO2 97 %.  PHYSICAL EXAMINATION:  Physical Exam   GENERAL:  58 y.o.-year-old patient lying in the bed with no acute distress.  EYES: Pupils equal, round, reactive to light and accommodation. No scleral icterus. Extraocular muscles intact.  HEENT: Head atraumatic, normocephalic. Oropharynx and nasopharynx clear.  NECK:  Supple, no jugular venous distention. No thyroid enlargement, no tenderness.  LUNGS: Normal breath sounds bilaterally, no wheezing, rales,rhonchi or crepitation. No use of accessory muscles of respiration.  CARDIOVASCULAR: S1, S2 normal. No murmurs, rubs, or gallops.  ABDOMEN: Soft, non tender  Bowel sounds present. No organomegaly or mass.  EXTREMITIES: No pedal edema, cyanosis, or clubbing.  NEUROLOGIC: Motor strength 5/5 in upper and lower extremities pSYCHIATRIC:patient is awake and alert SKIN: No obvious rash, lesion, or ulcer.    LABORATORY PANEL:   CBC Recent Labs  Lab 02/26/19 0253  WBC 8.4  HGB 14.2  HCT 42.4  PLT 158   ------------------------------------------------------------------------------------------------------------------  Chemistries  Recent Labs  Lab 02/26/19 0253  CREATININE 1.06   ------------------------------------------------------------------------------------------------------------------  Cardiac Enzymes No results for input(s): TROPONINI in the  last 168 hours. ------------------------------------------------------------------------------------------------------------------  RADIOLOGY:  No results found.  EKG:   Orders placed or performed during the hospital encounter of 01/20/19  . EKG 12-Lead  . EKG 12-Lead  . ED EKG 12-Lead  . ED EKG 12-Lead    ASSESSMENT AND PLAN:  58 year old male with history of intracranial hemorrhage x2, short-term memory loss and erythrocytosis who presents with generalized weakness, confusion and fever.  *Acute Sepsis likely due toCT suggestive of possible proctitis Resolved [name] in the blood on 01/20/19 Completed course of antibiotics with p.o. Bactrim, infectious disease did see patient while in house   *Confusion, short-term memory loss Had rapidly progressive dementia - ?  Underlying vascular dementia as well. 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  No acute findings or no features of meningitis seen. Seems to be at baseline neurologically Awaiting placement  *Acute depression Stable Continue Remeron  *Portal vein thrombosis Stable Continue eliquis - monitor closely especially given history of intraventricular hemorrhage in the past  *Generalized weakness Improving PT recommended home health  *Hypertension, chronic Continue Norvasc  Currently no family-in discussion with case management/social work-awaiting guardianship/placement, unsafe discharge at this time, APS notified   CODE STATUS: Full Code  TOTAL TIME TAKING CARE OF THIS PATIENT: 20 minutes.    Evelena Asa Crispin Vogel M.D on 02/27/2019 at 11:07 AM  Between 7am to 6pm - Pager - [phone]  After 6pm go to [email or web address] - Social research officer, government  [facility] [place] [facility]  Office  [phone]  CC: Primary care physician; Mable Paris, PA-C

## 2019-02-28 NOTE — Plan of Care (Signed)
  Problem: Skin Integrity: Goal: Risk for impaired skin integrity will decrease Outcome: Progressing   Problem: Activity: Goal: Risk for activity intolerance will decrease Outcome: Progressing   Problem: Safety: Goal: Ability to remain free from injury will improve Outcome: Progressing   Problem: Skin Integrity: Goal: Risk for impaired skin integrity will decrease Outcome: Progressing   

## 2019-02-28 NOTE — Progress Notes (Signed)
[employer] - [place] at [facility]     PATIENT NAME: Anola GurneyHansy Wieczorek    MR#:  [id number]  DATE OF BIRTH:  [birth year]  SUBJECTIVE:   No acute events overnight.  No new complaints.  Awaiting placement.   REVIEW OF SYSTEMS:    Review of Systems  Constitutional: Negative for chills and fever.  HENT: Negative for congestion and tinnitus.   Eyes: Negative for blurred vision and double vision.  Respiratory: Negative for cough, shortness of breath and wheezing.   Cardiovascular: Negative for chest pain, orthopnea and PND.  Gastrointestinal: Negative for abdominal pain, diarrhea, nausea and vomiting.  Genitourinary: Negative for dysuria and hematuria.  Neurological: Negative for dizziness, sensory change and focal weakness.  All other systems reviewed and are negative.   Nutrition: Regular Tolerating Diet: Yes Tolerating PT: Eval noted.      DRUG ALLERGIES:  No Known Allergies  VITALS:  Blood pressure 121/85, pulse 75, temperature 98.5 F (36.9 C), temperature source Oral, resp. rate 15, height 5\' 11"  (1.803 m), weight 87.6 kg, SpO2 99 %.  PHYSICAL EXAMINATION:   Physical Exam  GENERAL:  58 y.o.-year-old obese patient lying in bed in no acute distress.  EYES: Pupils equal, round, reactive to light and accommodation. No scleral icterus. Extraocular muscles intact.  HEENT: Head atraumatic, normocephalic. Oropharynx and nasopharynx clear.  NECK:  Supple, no jugular venous distention. No thyroid enlargement, no tenderness.  LUNGS: Normal breath sounds bilaterally, no wheezing, rales, rhonchi. No use of accessory muscles of respiration.  CARDIOVASCULAR: S1, S2 normal. No murmurs, rubs, or gallops.  ABDOMEN: Soft, nontender, nondistended. Bowel sounds present. No organomegaly or mass.  EXTREMITIES: No cyanosis, clubbing or edema b/l.    NEUROLOGIC: Cranial nerves II through XII are intact. No focal Motor or sensory deficits b/l. Globally weak.   PSYCHIATRIC:  The patient is alert and oriented x 3.  SKIN: No obvious rash, lesion, or ulcer.    LABORATORY PANEL:   CBC Recent Labs  Lab 02/26/19 0253  WBC 8.4  HGB 14.2  HCT 42.4  PLT 158   ------------------------------------------------------------------------------------------------------------------  Chemistries  Recent Labs  Lab 02/26/19 0253  CREATININE 1.06   ------------------------------------------------------------------------------------------------------------------  Cardiac Enzymes No results for input(s): TROPONINI in the last 168 hours. ------------------------------------------------------------------------------------------------------------------  RADIOLOGY:  No results found.   ASSESSMENT AND PLAN:   58 year old male with history of intracranial hemorrhage x2, short-term memory loss and erythrocytosis who presents with generalized weakness, confusion and fever.  *Acute Sepsis likely due toCT suggestive of possible proctitis Resolved [name] in the blood on 01/20/19 Completed course of antibiotics with p.o. Bactrim, infectious disease did see patient while in house   *Confusion, short-term memory loss Had rapidly progressive dementia - ?  Underlying vascular dementia as well. 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  No acute findings or no features of meningitis seen. - mental status at baseline.    *Acute depression Continue Remeron  *Portal vein thrombosis Continue eliquis - monitor closely especially given history of intraventricular hemorrhage in the past  *Generalized weakness PT recommended home health  *Hypertension, chronic Continue Norvasc  Patient awaiting legal guardianship and has no safe disposition presently.  Adult Protective Services have been identified.  Await further disposition guidance as per social work.   All the records are reviewed and case  discussed with Care Management/Social Worker. Management plans discussed with the patient, family and they are in agreement.  CODE STATUS: Full code  DVT Prophylaxis: Eliquis  TOTAL TIME TAKING CARE OF THIS PATIENT: 30 minutes.   POSSIBLE D/C unclear   Houston Siren M.D on 02/28/2019 at 2:10 PM  Between 7am to 6pm - Pager - [phone]  After 6pm go to [email or web address] - Scientist, research (life sciences) [place] [facility]  Office  [phone]  CC: Primary care physician; Mable Paris, PA-C

## 2019-03-01 MED ORDER — TRAMADOL HCL 50 MG PO TABS
50.0000 mg | ORAL_TABLET | Freq: Four times a day (QID) | ORAL | Status: DC | PRN
  Administered 2019-03-01 – 2019-03-30 (×3): 50 mg via ORAL
  Filled 2019-03-01 (×3): qty 1

## 2019-03-01 NOTE — Progress Notes (Signed)
[employer] - [place] at [facility]     PATIENT NAME: Maxwell Miller    MR#:  [id number]  DATE OF BIRTH:  05/02/1961  SUBJECTIVE:   No acute events overnight.  Complaining of some right ankle pain.  Awaiting placement.   REVIEW OF SYSTEMS:    Review of Systems  Constitutional: Negative for chills and fever.  HENT: Negative for congestion and tinnitus.   Eyes: Negative for blurred vision and double vision.  Respiratory: Negative for cough, shortness of breath and wheezing.   Cardiovascular: Negative for chest pain, orthopnea and PND.  Gastrointestinal: Negative for abdominal pain, diarrhea, nausea and vomiting.  Genitourinary: Negative for dysuria and hematuria.  Neurological: Negative for dizziness, sensory change and focal weakness.  All other systems reviewed and are negative.   Nutrition: Regular Tolerating Diet: Yes Tolerating PT: Eval noted.      DRUG ALLERGIES:  No Known Allergies  VITALS:  Blood pressure (!) 122/92, pulse 71, temperature 98.1 F (36.7 C), temperature source Oral, resp. rate 16, height 5\' 11"  (1.803 m), weight 87.6 kg, SpO2 99 %.  PHYSICAL EXAMINATION:   Physical Exam  GENERAL:  58 y.o.-year-old obese patient lying in bed in no acute distress.  EYES: Pupils equal, round, reactive to light and accommodation. No scleral icterus. Extraocular muscles intact.  HEENT: Head atraumatic, normocephalic. Oropharynx and nasopharynx clear.  NECK:  Supple, no jugular venous distention. No thyroid enlargement, no tenderness.  LUNGS: Normal breath sounds bilaterally, no wheezing, rales, rhonchi. No use of accessory muscles of respiration.  CARDIOVASCULAR: S1, S2 normal. No murmurs, rubs, or gallops.  ABDOMEN: Soft, nontender, nondistended. Bowel sounds present. No organomegaly or mass.  EXTREMITIES: No cyanosis, clubbing or edema b/l.    NEUROLOGIC: Cranial nerves II through XII are intact. No focal Motor or sensory deficits b/l.   PSYCHIATRIC: The patient is alert and oriented x 3.  SKIN: No obvious rash, lesion, or ulcer.    LABORATORY PANEL:   CBC Recent Labs  Lab 02/26/19 0253  WBC 8.4  HGB 14.2  HCT 42.4  PLT 158   ------------------------------------------------------------------------------------------------------------------  Chemistries  Recent Labs  Lab 02/26/19 0253  CREATININE 1.06   ------------------------------------------------------------------------------------------------------------------  Cardiac Enzymes No results for input(s): TROPONINI in the last 168 hours. ------------------------------------------------------------------------------------------------------------------  RADIOLOGY:  No results found.   ASSESSMENT AND PLAN:   58 year old male with history of intracranial hemorrhage x2, short-term memory loss and erythrocytosis who presents with generalized weakness, confusion and fever.  *Acute Sepsis likely due toCT suggestive of possible proctitis Resolved [name] in the blood on 01/20/19 Completed course of antibiotics with p.o. Bactrim, infectious disease did see patient while in house   *Confusion, short-term memory loss Had rapidly progressive dementia - ?  Underlying vascular dementia as well. 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  No acute findings or no features of meningitis seen. - mental status at baseline.    *Acute depression Continue Remeron  *Portal vein thrombosis Continue eliquis - monitor closely especially given history of intraventricular hemorrhage in the past  *Generalized weakness PT recommended home health  *Hypertension, chronic Continue Norvasc  Patient awaiting legal guardianship and has no safe disposition presently.  Adult Protective Services have been identified.  Await further disposition guidance as per social work.   All the records are reviewed  and case discussed with Care Management/Social Worker. Management plans discussed with the patient, family and they are in agreement.  CODE STATUS: Full code  DVT Prophylaxis: Eliquis  TOTAL TIME TAKING CARE OF THIS PATIENT: 25 minutes.   POSSIBLE D/C unclear   Houston Siren M.D on 03/01/2019 at 1:42 PM  Between 7am to 6pm - Pager - [phone]  After 6pm go to [email or web address] - Scientist, research (life sciences) [place] [facility]  Office  [phone]  CC: Primary care physician; Mable Paris, PA-C

## 2019-03-02 NOTE — Progress Notes (Signed)
[employer] - [place] at [facility]     PATIENT NAME: Shashwat Meldrum    MR#:  [id number]  DATE OF BIRTH:  November 09, 1961  SUBJECTIVE:   No acute events overnight.  Denies any pain, shortness of breath, nausea or vomiting.  Awaiting placement.  REVIEW OF SYSTEMS:    Review of Systems  Constitutional: Negative for chills and fever.  HENT: Negative for congestion and tinnitus.   Eyes: Negative for blurred vision and double vision.  Respiratory: Negative for cough, shortness of breath and wheezing.   Cardiovascular: Negative for chest pain, orthopnea and PND.  Gastrointestinal: Negative for abdominal pain, diarrhea, nausea and vomiting.  Genitourinary: Negative for dysuria and hematuria.  Neurological: Negative for dizziness, sensory change and focal weakness.  All other systems reviewed and are negative.   Nutrition: Regular Tolerating Diet: Yes Tolerating PT: Eval noted.      DRUG ALLERGIES:  No Known Allergies  VITALS:  Blood pressure 124/86, pulse 66, temperature 97.7 F (36.5 C), temperature source Oral, resp. rate 16, height 5\' 11"  (1.803 m), weight 87.6 kg, SpO2 97 %.  PHYSICAL EXAMINATION:   Physical Exam  GENERAL:  58 y.o.-year-old obese patient lying in bed in no acute distress.  EYES: Pupils equal, round, reactive to light and accommodation. No scleral icterus. Extraocular muscles intact.  HEENT: Head atraumatic, normocephalic. Oropharynx and nasopharynx clear.  NECK:  Supple, no jugular venous distention. No thyroid enlargement, no tenderness.  LUNGS: Normal breath sounds bilaterally, no wheezing, rales, rhonchi. No use of accessory muscles of respiration.  CARDIOVASCULAR: S1, S2 normal. No murmurs, rubs, or gallops.  ABDOMEN: Soft, nontender, nondistended. Bowel sounds present. No organomegaly or mass.  EXTREMITIES: No cyanosis, clubbing or edema b/l.    NEUROLOGIC: Cranial nerves II through XII are intact. No focal Motor or sensory deficits  b/l.  PSYCHIATRIC: The patient is alert and oriented x 3.  SKIN: No obvious rash, lesion, or ulcer.    LABORATORY PANEL:   CBC Recent Labs  Lab 02/26/19 0253  WBC 8.4  HGB 14.2  HCT 42.4  PLT 158   ------------------------------------------------------------------------------------------------------------------  Chemistries  Recent Labs  Lab 02/26/19 0253  CREATININE 1.06   ------------------------------------------------------------------------------------------------------------------  Cardiac Enzymes No results for input(s): TROPONINI in the last 168 hours. ------------------------------------------------------------------------------------------------------------------  RADIOLOGY:  No results found.   ASSESSMENT AND PLAN:   58 year old male with history of intracranial hemorrhage x2, short-term memory loss and erythrocytosis who presents with generalized weakness, confusion and fever.  *Acute Sepsis likely due toCT suggestive of possible proctitis and now resolved. [name] in the blood on 01/20/19 Completed course of antibiotics with p.o. Bactrim - currently afebrile, hemodynamically stable.   *Confusion, short-term memory loss Had rapidly progressive dementia - ?  Underlying vascular dementia as well. 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  No acute findings or no features of meningitis seen. - mental status currently at baseline.    *Acute depression - improved. Continue Remeron  *Portal vein thrombosis Continue eliquis - monitor closely especially given history of intraventricular hemorrhage in the past  *Generalized weakness PT recommending SNF/STR but pt. Is awaiting guardianship.   *Hypertension, chronic Continue Norvasc  Patient awaiting legal guardianship and has no safe disposition presently.  Adult Protective Services have been identified.  Await further disposition  guidance as per social work.   All the records are reviewed and case discussed with Care Management/Social Worker. Management plans discussed with the patient, family and they  are in agreement.  CODE STATUS: Full code  DVT Prophylaxis: Eliquis  TOTAL TIME TAKING CARE OF THIS PATIENT: 25 minutes.   POSSIBLE D/C unclear   Houston SirenVivek J  M.D on 03/02/2019 at 2:10 PM  Between 7am to 6pm - Pager - [phone]  After 6pm go to [email or web address] - Scientist, research (life sciences)password EPAS [facility]  [facility] [place] [facility]  Office  [phone]  CC: Primary care physician; Mable ParisWilliams, Katrina, PA-C

## 2019-03-02 NOTE — Progress Notes (Signed)
Physical Therapy Treatment Patient Details Name: Maxwell Miller MRN: [id number] DOB: [birth year] Today's Date: 03/02/2019    History of Present Illness Maxwell Miller is a 58 yo male who [facility] on [facility]19 c AMS, confusion, and fever, admitted with sepsis and related encephalopathy. 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 Pt showed good improvement in language fluency working with SLP, but memory and attention deficits persisted despite OT services. PTA pt was living with his spouse, AMB freely without assistive device or difficulty, but needed assistance with ADL/IADL and close supervision due to lability and severe ST/LT memory impairment. [facility], pt hd also made good progress working with psychiatry.  PMHx: portal vein thrombosis, CVA, HTN, erythrocytosis.     PT Comments    Patient agreeable to exercises and attempting to sit EOB at start of session. To maximize pt participation, PT presented choices throughout session and extended time for thought processing. Little intiation of movement on RLE for exercises, extended time given. some improvement noted with time and repetition. AAROM for all motions on RLE, AAROM on LLE though much improved compared to R side. Verbal cues and supervision/set up for pulling self up in bed. with extended time, explanation and cues for LLE to help manage RLE, pt able to sit up with minA. Pt needed modAx2 R knee block, and stabilization of RW for sit to stand transfer, as well as to maintain standing. Able to maintain ~10seconds. PT returned to supine minA and bed placed in chair position at end of session. Goals also updated this session and downgraded due to patient's decline in functional status. The patient would benefit from further skilled PT to maximize mobility, safety, and  independence.    Follow Up Recommendations  SNF     Equipment Recommendations  Wheelchair (measurements PT);Wheelchair cushion (measurements PT);3in1 (PT)    Recommendations for Other Services       Precautions / Restrictions Precautions Precautions: Fall Restrictions Weight Bearing Restrictions: No    Mobility  Bed Mobility Overal bed mobility: Needs Assistance Bed Mobility: Sit to Supine;Supine to Sit     Supine to sit: Min assist Sit to supine: Min assist   General bed mobility comments: Verbal cues and supervision/set up for pulling self up in bed. with extended time, explanation and cues for LLE to help manage RLE, pt able to sit up with minA.  Transfers Overall transfer level: Needs assistance Equipment used: Rolling walker (2 wheeled) Transfers: Sit to/from Stand   Stand pivot transfers: Mod assist;+2 physical assistance       General transfer comment: Pt needed modAx2 R knee block, and stabilization of RW for transfer, as well as to maintain standing.   Ambulation/Gait             General Gait Details: unable   Stairs             Wheelchair Mobility    Modified Rankin (Stroke Patients Only)       Balance Overall balance assessment: Needs assistance Sitting-balance support: Feet supported Sitting balance-Leahy Scale: Fair                                      Cognition Arousal/Alertness: Awake/alert Behavior During Therapy: WFL for tasks assessed/performed Overall Cognitive Status: History of cognitive  impairments - at baseline                         Following Commands: Follows one step commands consistently     Problem Solving: Slow processing;Requires tactile cues;Decreased initiation        Exercises General Exercises - Lower Extremity Ankle Circles/Pumps: AROM;Right;Supine;AAROM;Other (comment)(12 reps) Short Arc QuadBarbaraann Boys;Right;Both Heel Slides: Right;15 reps;Supine;AAROM;Left Hip  ABduction/ADduction: AAROM;15 reps;Both Straight Leg Raises: AAROM;Both;15 reps Other Exercises Other Exercises: for little intiation of movement on RLE for exercises, extended time given. some improvement noted with time and repetition.     General Comments General comments (skin integrity, edema, etc.): extra given throughout session for processing, initiation of movement, and to encourage patient participation.      Pertinent Vitals/Pain Pain Location: Reporting some dyscomfort at the anterior ankle, medial middiaphyseal tibia, and some calf discomfort on Right.     Home Living                      Prior Function            PT Goals (current goals can now be found in the care plan section) Progress towards PT goals: Progressing toward goals    Frequency    Min 2X/week      PT Plan Current plan remains appropriate;Other (comment)(goals are updated)    Co-evaluation              AM-PAC PT "6 Clicks" Mobility   Outcome Measure  Help needed turning from your back to your side while in a flat bed without using bedrails?: A Lot Help needed moving from lying on your back to sitting on the side of a flat bed without using bedrails?: A Lot Help needed moving to and from a bed to a chair (including a wheelchair)?: A Lot Help needed standing up from a chair using your arms (e.g., wheelchair or bedside chair)?: A Lot Help needed to walk in hospital room?: Total Help needed climbing 3-5 steps with a railing? : Total 6 Click Score: 10    End of Session Equipment Utilized During Treatment: Gait belt Activity Tolerance: Patient limited by fatigue Patient left: in bed;with bed alarm set;with call bell/phone within reach Nurse Communication: Mobility status PT Visit Diagnosis: Unsteadiness on feet (R26.81);Other abnormalities of gait and mobility (R26.89);Adult, failure to thrive (R62.7)     Time: 4193-7902 PT Time Calculation (min) (ACUTE ONLY): 39  min  Charges:  $Therapeutic Exercise: 23-37 mins $Therapeutic Activity: 8-22 mins                    Olga Coaster PT, DPT 12:51 PM,03/02/19 [phone]

## 2019-03-03 MED ORDER — SENNOSIDES-DOCUSATE SODIUM 8.6-50 MG PO TABS
2.0000 | ORAL_TABLET | Freq: Two times a day (BID) | ORAL | Status: DC
  Administered 2019-03-03 – 2019-04-01 (×53): 2 via ORAL
  Filled 2019-03-03 (×56): qty 2

## 2019-03-03 NOTE — Progress Notes (Signed)
[employer] - [place] at [facility]     PATIENT NAME: Maxwell Miller    MR#:  [id number]  DATE OF BIRTH:  [birth year]  SUBJECTIVE:   No acute events overnight.  Denies any pain, shortness of breath, nausea or vomiting.  Awaiting placement.  REVIEW OF SYSTEMS:    Review of Systems  Constitutional: Negative for chills and fever.  HENT: Negative for congestion and tinnitus.   Eyes: Negative for blurred vision and double vision.  Respiratory: Negative for cough, shortness of breath and wheezing.   Cardiovascular: Negative for chest pain, orthopnea and PND.  Gastrointestinal: Negative for abdominal pain, diarrhea, nausea and vomiting.  Genitourinary: Negative for dysuria and hematuria.  Neurological: Negative for dizziness, sensory change and focal weakness.  All other systems reviewed and are negative.   Nutrition: Regular Tolerating Diet: Yes Tolerating PT: Eval noted.      DRUG ALLERGIES:  No Known Allergies  VITALS:  Blood pressure 135/85, pulse (!) 102, temperature 98 F (36.7 C), temperature source Oral, resp. rate 14, height 5\' 11"  (1.803 m), weight 87.6 kg, SpO2 95 %.  PHYSICAL EXAMINATION:   Physical Exam  GENERAL:  58 y.o.-year-old obese patient lying in bed in no acute distress.  EYES: Pupils equal, round, reactive to light and accommodation. No scleral icterus. Extraocular muscles intact.  HEENT: Head atraumatic, normocephalic. Oropharynx and nasopharynx clear.  NECK:  Supple, no jugular venous distention. No thyroid enlargement, no tenderness.  LUNGS: Normal breath sounds bilaterally, no wheezing, rales, rhonchi. No use of accessory muscles of respiration.  CARDIOVASCULAR: S1, S2 normal. No murmurs, rubs, or gallops.  ABDOMEN: Soft, nontender, nondistended. Bowel sounds present. No organomegaly or mass.  EXTREMITIES: No cyanosis, clubbing or edema b/l.    NEUROLOGIC: Cranial nerves II through XII are intact. No focal Motor or sensory  deficits b/l. Globally weak.   PSYCHIATRIC: The patient is alert and oriented x 3.  SKIN: No obvious rash, lesion, or ulcer.    LABORATORY PANEL:   CBC Recent Labs  Lab 02/26/19 0253  WBC 8.4  HGB 14.2  HCT 42.4  PLT 158   ------------------------------------------------------------------------------------------------------------------  Chemistries  Recent Labs  Lab 02/26/19 0253  CREATININE 1.06   ------------------------------------------------------------------------------------------------------------------  Cardiac Enzymes No results for input(s): TROPONINI in the last 168 hours. ------------------------------------------------------------------------------------------------------------------  RADIOLOGY:  No results found.   ASSESSMENT AND PLAN:   58 year old male with history of intracranial hemorrhage x2, short-term memory loss and erythrocytosis who presents with generalized weakness, confusion and fever.  *Acute Sepsis - due to proctitis and now resolved. [name] in the blood on 01/20/19 Completed course of antibiotics with p.o. Bactrim - currently afebrile, hemodynamically stable.   *Confusion, short-term memory loss Had rapidly progressive dementia - ?  Underlying vascular dementia as well. 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  No acute findings or no features of meningitis seen. - mental status currently at baseline.    *Acute depression - improved. Continue Remeron  *Portal vein thrombosis Continue eliquis - monitor closely especially given history of intraventricular hemorrhage in the past  *Generalized weakness PT recommending SNF/STR but pt. Is awaiting guardianship.   *Hypertension, chronic Continue Norvasc  Patient awaiting legal guardianship and has no safe disposition presently.  Adult Protective Services have been identified.  Await further disposition guidance  as per social work.   All the records are reviewed and case discussed with Care Management/Social Worker. Management plans discussed with the patient, family and  they are in agreement.  CODE STATUS: Full code  DVT Prophylaxis: Eliquis  TOTAL TIME TAKING CARE OF THIS PATIENT: 20 minutes.   POSSIBLE D/C unclear   Houston SirenVivek J Chrissi Crow M.D on 03/02/57 at 2:08 PM  Between 7am to 6pm - Pager - [phone]  After 6pm go to [email or web address] - Scientist, research (life sciences)password EPAS [facility]  [facility]  [facility]  Office  [phone]  CC: Primary care physician; Mable ParisWilliams, Katrina, PA-C

## 2019-03-03 NOTE — Progress Notes (Signed)
Physical Therapy Treatment Patient Details Name: Maxwell GurneyHansy Shawn MRN: [id number] DOB: [birth year] Today's Date: 03/03/2019    History of Present Illness Maxwell Miller is a 58 yo male who comes to [facility] on 01/20/19 c AMS, confusion, and fever, admitted with sepsis and related encephalopathy. 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 Pt showed good improvement in language fluency working with SLP, but memory and attention deficits persisted despite OT services. PTA pt was living with his spouse, AMB freely without assistive device or difficulty, but needed assistance with ADL/IADL and close supervision due to lability and severe ST/LT memory impairment. [facility], pt hd also made good progress working with psychiatry.  PMHx: portal vein thrombosis, CVA, HTN, erythrocytosis.     PT Comments    Patient tolerated treatment fair and is not making significant progress towards goals at this point. He continues to demonstrate good movement patterns when performing automatic tasks such as adjusting his clothing, but only able to perform gross motor function intermittently with cuing, LE appears to be more affected than UE, RLE most affected. Patient required mod A +2 for supine to sit using log roll technique and heavy cuing. He performed STS from elevated edge of bed to RW with mod-max A +2 and 1 min of standing leading to reports of dizziness and the room spinning upon sitting. Patient with difficulty maintaining upright posture in seated position requiring up to mod A to prevent falls backwards or to each side. BP 135/85 mmHg, O2 sat 95%, and HR 102 bpm at this point and does not explain dizziness. Patient attempted seated exercise at this point, requiring AAROM LAQ with more activation demonstrated on L compared to R. Attempted second  STS from EOB to RW with max A +2 without success due to failure to extend hips and knees. Immediately completed stand pivot transfer bed to chair with mod A +2 for safety with patient demonstrating significantly improved ability to participate. Patient sought to be argumentative at times throughout session, seeking to engage in logic arguments, but was able to be calmed and redirected. Responded well emotionally to asking permission prior to each command. Patient comfortable in chair at end of session with needs within reach and alarm set. Patient would benefit from continued physical therapy to address remaining impairments and functional limitations to work towards stated goals and return to PLOF or maximal functional independence.    Follow Up Recommendations  SNF     Equipment Recommendations  Wheelchair (measurements PT);Wheelchair cushion (measurements PT);3in1 (PT)    Recommendations for Other Services       Precautions / Restrictions Precautions Precautions: Fall Restrictions Weight Bearing Restrictions: No    Mobility  Bed Mobility Overal bed mobility: Needs Assistance Bed Mobility: Supine to Sit     Supine to sit: Mod assist     General bed mobility comments: Required physical assistance to advance arms and legs, argued about education and explanation provided to push with left arm against bed to come to sit as part of log roll, required physical assistance to advance BLE and support trunk after failing to move with extended time to move more independently. Required up to mod A to maintain postural control in sitting but at times able to maintain balance with supervision only.   Transfers Overall transfer level: Needs assistance Equipment used: Rolling walker (2 wheeled) Transfers: Sit to/from Stand  Sit to Stand: Max assist;+2 physical assistance;Mod assist Stand pivot transfers: Mod assist;+2 physical assistance Squat pivot transfers: From elevated surface     General  transfer comment: Pt completed sit <> stand from elevated edge of bed mod-max A +2, tactile cuing at quads and glutes to come to full stand, tactile guidance for hand placement. Failed second attempt due to lack of hip and knee extension.  completed  stand pivot with mod A +2 for safety. Good activation of legs campred to attempt at STS with RW directly prior. Took small steps to chair.   Ambulation/Gait Ambulation/Gait assistance: (no appropriate at this time)           General Gait Details: unable   Stairs             Wheelchair Mobility    Modified Rankin (Stroke Patients Only)       Balance Overall balance assessment: Needs assistance Sitting-balance support: Feet supported Sitting balance-Leahy Scale: Fair Sitting balance - Comments: sits at EOB x15+ minutes, intermittnet minA, leaning R, L, and backwards at times.  Postural control: Posterior lean;Right lateral lean;Left lateral lean Standing balance support: Bilateral upper extremity supported Standing balance-Leahy Scale: Poor Standing balance comment: able to stand at [facility] for up to 1 min with mod A increasing to max A +2. Strong backwards lean that improves intermittantly with cuing. Shaking throughout body noted.                             Cognition Arousal/Alertness: Awake/alert Behavior During Therapy: Agitated;Flat affect(argumentative at times; able to be calmed with accomodating conversation and asking permission prior to each task) Overall Cognitive Status: History of cognitive impairments - at baseline                         Following Commands: Follows one step commands inconsistently Safety/Judgement: Decreased awareness of safety   Problem Solving: Slow processing;Requires tactile cues;Decreased initiation;Difficulty sequencing;Requires verbal cues General Comments: Baseline heavy ST/LT memory impairment. Pt also has a history of aphasia, now still seems to struggle with  language in describing his symptoms and complaints. Seeks to engage in logic arguments.       Exercises Other Exercises Other Exercises: AAROM LAQ in sitting x 10 each side; notable for mild quad contration on left; minimal activation on R Other Exercises: Seated trunk control with attempt at alternating UE flexion 2x3 each side; able to hold arms out with shoulders flexed to 90 degrees and move them about 4 inches up and down 50% of the time with prolonged time for processing and visual,tactile, and verbal cuing.     General Comments General comments (skin integrity, edema, etc.): extra time given throughout session for processing, initiation of movement, and encouragement to participate      Pertinent Vitals/Pain Pain Assessment: No/denies pain    Home Living                      Prior Function            PT Goals (current goals can now be found in the care plan section) Acute Rehab PT Goals Patient Stated Goal: Return to walking and being able to go places PT Goal Formulation: With patient Time For Goal Achievement: 02/27/19 Potential to Achieve Goals: Fair Progress towards PT goals: Not progressing toward goals - comment(continues to be limited)    Frequency  Min 2X/week      PT Plan Current plan remains appropriate;Other (comment)(goals are updated)    Co-evaluation              AM-PAC PT "6 Clicks" Mobility   Outcome Measure  Help needed turning from your back to your side while in a flat bed without using bedrails?: A Lot Help needed moving from lying on your back to sitting on the side of a flat bed without using bedrails?: A Lot Help needed moving to and from a bed to a chair (including a wheelchair)?: A Lot Help needed standing up from a chair using your arms (e.g., wheelchair or bedside chair)?: A Lot Help needed to walk in hospital room?: Total Help needed climbing 3-5 steps with a railing? : Total 6 Click Score: 10    End of Session  Equipment Utilized During Treatment: Gait belt Activity Tolerance: Patient limited by fatigue Patient left: with call bell/phone within reach;in chair;with chair alarm set Nurse Communication: Mobility status(results of session) PT Visit Diagnosis: Unsteadiness on feet (R26.81);Other abnormalities of gait and mobility (R26.89);Adult, failure to thrive (R62.7)     Time: 0981-1914 PT Time Calculation (min) (ACUTE ONLY): 25 min  Charges:  $Therapeutic Activity: 23-37 mins                     Luretha Murphy. Ilsa Iha, PT, DPT 03/03/19, 11:52 AM

## 2019-03-03 NOTE — Plan of Care (Signed)
  Problem: Skin Integrity: Goal: Risk for impaired skin integrity will decrease Outcome: Progressing   Problem: Activity: Goal: Risk for activity intolerance will decrease Outcome: Progressing   Problem: Safety: Goal: Ability to remain free from injury will improve Outcome: Progressing   Problem: Skin Integrity: Goal: Risk for impaired skin integrity will decrease Outcome: Progressing   

## 2019-03-03 NOTE — Plan of Care (Signed)
No changes

## 2019-03-03 NOTE — Progress Notes (Signed)
OT Cancellation Note  Patient Details Name: Yakov Laughton MRN: [id number] DOB: 04/04/1961   Cancelled Treatment:    Reason Eval/Treat Not Completed: Fatigue/lethargy limiting ability to participate. Upon attempt, pt recently back to bed after performing a sponge bath with set up but no physical assist, per NT. Pt fatigued and states "You don't want to know" when asked how he was doing today. Pt appreciative of snacks provided by therapist. Will re-attempt OT tx next date.   Richrd Prime, MPH, MS, OTR/L ascom [phone] 03/03/19, 3:54 PM

## 2019-03-04 NOTE — Plan of Care (Signed)
  Problem: Skin Integrity: Goal: Risk for impaired skin integrity will decrease Outcome: Progressing   Problem: Activity: Goal: Risk for activity intolerance will decrease Outcome: Progressing   Problem: Safety: Goal: Ability to remain free from injury will improve Outcome: Progressing   Problem: Skin Integrity: Goal: Risk for impaired skin integrity will decrease Outcome: Progressing   

## 2019-03-04 NOTE — Progress Notes (Signed)
Occupational Therapy Treatment Patient Details Name: Maxwell Miller MRN: [id number] DOB: 1961-06-13 Today's Date: 03/04/2019    History of present illness Maxwell Miller is a 58 yo male who comes to [facility] on 01/20/19 c AMS, confusion, and fever, admitted with sepsis and related encephalopathy. 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 Pt showed good improvement in language fluency working with SLP, but memory and attention deficits persisted despite OT services. PTA pt was living with his spouse, AMB freely without assistive device or difficulty, but needed assistance with ADL/IADL and close supervision due to lability and severe ST/LT memory impairment. Per neuro notes at [facility], pt hd also made good progress working with psychiatry.  PMHx: portal vein thrombosis, CVA, HTN, erythrocytosis.    OT comments  Pt seen for OT treatment on this date. Upon arrival to room pt awake, seated upright in bed with NT present. Pt requesting to get back to bed from chair. OT asked if pt would like to complete seated bath before getting back to bed and pt agreed. OT provided set-up assist for seated grooming and bathing task. Pt was able to complete most tasks independently and required min assist to wash and apply lotion to his back. Pt also required min A to don hospital gown (see ADL section below for more detail). Pt was quite talkative and eager to discuss is study of linguistics, his frustration over his body not moving how he would like it to, and the origin of his name. OT and mobility tech assisted pt back to bed with +2 mod/max. Pt due for re-evaluation on this date. He is making good progress toward goals, however has not yet met goals consistently. POC continues to remain appropriate. Goals updated to reflect pt current status. Pt continues to  benefit from skilled OT services to maximize return to PLOF and minimize risk of future falls, injury, caregiver burden, and readmission. Discharge recommendation remains appropriate.    Follow Up Recommendations  SNF;Supervision/Assistance - 24 hour    Equipment Recommendations  Other (comment)    Recommendations for Other Services      Precautions / Restrictions Precautions Precautions: Fall Restrictions Weight Bearing Restrictions: No       Mobility Bed Mobility Overal bed mobility: Needs Assistance Bed Mobility: Sit to Supine     Supine to sit: Mod assist Sit to supine: Mod assist   General bed mobility comments: Pt required heavy VCs and physical assistance for initiation of bed mobility on this date. Pt expressed that his legs seem to function worse when there are more people are in the room.    Transfers Overall transfer level: Needs assistance Equipment used: Rolling walker (2 wheeled) Transfers: Sit to/from [facility] Sit to Stand: +2 physical assistance;Mod assist Stand pivot transfers: Mod assist;+2 physical assistance       General transfer comment: Patient completed sit <> stand to RW with mod A +2. Required heavy multimodal cuing.    Balance Overall balance assessment: Needs assistance Sitting-balance support: Feet supported Sitting balance-Leahy Scale: Good Sitting balance - Comments: Pt able to maintain good seated balance while completing seated bath and grooming ADLs. Pt able to maintain steady trunk control and good seated balance with no UE support for several minutes at a time t/o session. Steady reaching within BOS.  Postural control: Posterior lean;Right lateral lean;Left lateral lean Standing balance support: Bilateral upper extremity  supported Standing balance-Leahy Scale: Poor Standing balance comment: Patient reliant on RW for support; requires assistance for stabilization and support max A +2                            ADL either performed or assessed with clinical judgement   ADL Overall ADL's : Needs assistance/impaired Eating/Feeding: Sitting;Set up   Grooming: Wash/dry face;Set up;Sitting;Oral care Grooming Details (indicate cue type and reason): Pt able to complete seated grooming task on this date including oral care, wash/dry face, and application of deodorant with set-up assist. Pt able to sequence tasks well and maintain seated balance t/o.  Upper Body Bathing: Set up;Minimal assistance;Sitting Upper Body Bathing Details (indicate cue type and reason): Pt completed seated bath with set-up assist from OT on this date. Pt required some light VCs for sequencing, but generally did very well with this task. Min assist provided to wash and apply lotion to pt back. Lower Body Bathing: Set up;Moderate assistance;Sitting/lateral leans Lower Body Bathing Details (indicate cue type and reason): Pt declined LB bathing during seated bath on this date. Suspect pt req mod assist for LEs, otherwise appeared very able to clean self during upper body bathing.  Upper Body Dressing : Minimal assistance;Set up Upper Body Dressing Details (indicate cue type and reason): Pt required min A to don clean hospital gown on this date. OT assisted with pulling gown over shoulders and tying.                  Functional mobility during ADLs: Rolling walker;+2 for physical assistance;+2 for safety/equipment;Moderate assistance;Maximal assistance General ADL Comments: Pt moved arms well t/o session. Regularly gestured and used both arms for seated bathing and grooming tasks. Pt able to complete functional transfer with +2 mod/max assist. Total assist for bed boost.      Vision Patient Visual Report: No change from baseline     Perception     Praxis      Cognition Arousal/Alertness: Awake/alert Behavior During Therapy: WFL for tasks assessed/performed Overall Cognitive Status: Within Functional Limits for tasks  assessed Area of Impairment: Memory;Following commands;Problem solving;Attention;Safety/judgement                   Current Attention Level: Selective Memory: Decreased recall of precautions;Decreased short-term memory Following Commands: Follows one step commands inconsistently Safety/Judgement: Decreased awareness of safety   Problem Solving: Slow processing;Requires tactile cues;Decreased initiation;Difficulty sequencing;Requires verbal cues General Comments: Baseline heavy ST/LT memory impairment. Pt also has a history of aphasia, now still seems to struggle with language in describing his symptoms and complaints. Seeks to engage in arguments at times         Exercises Other Exercises Other Exercises: OT facilitated seated bath and grooming tasks on this date. Pt required set-up assist for all tasks and min assist for UB bathing.    Shoulder Instructions       General Comments Extra time given t/o session for processing and initiation of motor tasks. Pt willing to participate readily and engaged well with this therapist t/o session.     Pertinent Vitals/ Pain       Pain Assessment: Faces Pain Score: 7  Faces Pain Scale: Hurts little more Pain Location: right knee during weight bearing activities Pain Descriptors / Indicators: Constant;Aching Pain Intervention(s): Limited activity within patient's tolerance;Repositioned;Monitored during session  Home Living  Prior Functioning/Environment              Frequency  Min 1X/week        Progress Toward Goals  OT Goals(current goals can now be found in the care plan section)  Progress towards OT goals: Progressing toward goals  Acute Rehab OT Goals Patient Stated Goal: Return to walking and being able to go places OT Goal Formulation: With patient Time For Goal Achievement: 03/18/19 Potential to Achieve Goals: Good ADL Goals Pt Will Perform Grooming:  with set-up;sitting Pt Will Perform Upper Body Dressing: with min assist;sitting;with set-up Pt Will Perform Lower Body Dressing: with max assist;with adaptive equipment;with set-up;sit to/from stand  Plan Discharge plan remains appropriate;Frequency remains appropriate    Co-evaluation                 AM-PAC OT "6 Clicks" Daily Activity     Outcome Measure   Help from another person eating meals?: None Help from another person taking care of personal grooming?: None Help from another person toileting, which includes using toliet, bedpan, or urinal?: A Lot Help from another person bathing (including washing, rinsing, drying)?: A Lot Help from another person to put on and taking off regular upper body clothing?: A Little Help from another person to put on and taking off regular lower body clothing?: A Lot 6 Click Score: 17    End of Session Equipment Utilized During Treatment: Gait belt;Rolling walker      Activity Tolerance Patient tolerated treatment well   Patient Left in bed;with call bell/phone within reach;with bed alarm set   Nurse Communication          Time: 4627-0350 OT Time Calculation (min): 44 min  Charges: OT General Charges $OT Visit: 1 Visit OT Treatments $Self Care/Home Management : 38-52 mins  Shara Blazing, M.S., OTR/L Ascom: [phone] 03/04/19, 2:29 PM

## 2019-03-04 NOTE — Progress Notes (Signed)
[employer] - [facility]     PATIENT NAME: Maxwell Miller    MR#:  [id number]  DATE OF BIRTH:  1961/08/23  SUBJECTIVE:   No acute events overnight.  Denies any pain, shortness of breath, nausea or vomiting.  Awaiting placement.  REVIEW OF SYSTEMS:    Review of Systems  Constitutional: Negative for chills and fever.  HENT: Negative for congestion and tinnitus.   Eyes: Negative for blurred vision and double vision.  Respiratory: Negative for cough, shortness of breath and wheezing.   Cardiovascular: Negative for chest pain, orthopnea and PND.  Gastrointestinal: Negative for abdominal pain, diarrhea, nausea and vomiting.  Genitourinary: Negative for dysuria and hematuria.  Neurological: Negative for dizziness, sensory change and focal weakness.  All other systems reviewed and are negative.   Nutrition: Regular Tolerating Diet: Yes Tolerating PT: Eval noted.    DRUG ALLERGIES:  No Known Allergies  VITALS:  Blood pressure 127/86, pulse 77, temperature 97.9 F (36.6 C), temperature source Oral, resp. rate 18, height 5\' 11"  (1.803 m), weight 87.6 kg, SpO2 97 %.  PHYSICAL EXAMINATION:   Physical Exam  GENERAL:  58 y.o.-year-old obese patient lying in bed in no acute distress.  EYES: Pupils equal, round, reactive to light and accommodation. No scleral icterus. Extraocular muscles intact.  HEENT: Head atraumatic, normocephalic. Oropharynx and nasopharynx clear.  NECK:  Supple, no jugular venous distention. No thyroid enlargement, no tenderness.  LUNGS: Normal breath sounds bilaterally, no wheezing, rales, rhonchi. No use of accessory muscles of respiration.  CARDIOVASCULAR: S1, S2 normal. No murmurs, rubs, or gallops.  ABDOMEN: Soft, nontender, nondistended. Bowel sounds present. No organomegaly or mass.  EXTREMITIES: No cyanosis, clubbing or edema b/l.    NEUROLOGIC: Cranial nerves II through XII are intact. No focal Motor or sensory deficits  b/l. Globally weak.   PSYCHIATRIC: The patient is alert and oriented x 3.  SKIN: No obvious rash, lesion, or ulcer.    LABORATORY PANEL:   CBC Recent Labs  Lab 02/26/19 0253  WBC 8.4  HGB 14.2  HCT 42.4  PLT 158   ------------------------------------------------------------------------------------------------------------------  Chemistries  Recent Labs  Lab 02/26/19 0253  CREATININE 1.06   ------------------------------------------------------------------------------------------------------------------  Cardiac Enzymes No results for input(s): TROPONINI in the last 168 hours. ------------------------------------------------------------------------------------------------------------------  RADIOLOGY:  No results found.   ASSESSMENT AND PLAN:   58 year old male with history of intracranial hemorrhage x2, short-term memory loss and erythrocytosis who presents with generalized weakness, confusion and fever.  *Acute Sepsis - due to proctitis and now resolved. [name] in the blood on 01/20/19 Completed course of antibiotics with p.o. Bactrim - currently afebrile, hemodynamically stable.   *Confusion, short-term memory loss  ?? Had rapidly progressive dementia - ?  Underlying vascular dementia as well. 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  No acute findings or no features of meningitis seen. - mental status currently at baseline.     *Acute depression - improved. Continue Remeron  *Portal vein thrombosis - Continue eliquis. No acute issues  *Generalized weakness PT recommending SNF/STR but pt. Is awaiting guardianship.   *Hypertension, chronic Continue Norvasc  Patient awaiting legal guardianship and has no safe disposition presently.  Adult Protective Services have been identified.  Await further disposition guidance as per social work.   All the records are reviewed and case  discussed with Care Management/Social Worker. Management plans discussed with the patient, family and they are in agreement.  CODE STATUS: Full  code  DVT Prophylaxis: Eliquis  TOTAL TIME TAKING CARE OF THIS PATIENT: 20 minutes.   POSSIBLE D/C unclear   Houston Siren M.D on 03/04/2019 at 12:13 PM  Between 7am to 6pm - Pager - [phone]  After 6pm go to [email or web address] - Scientist, research (life sciences) [facility]  Office  [phone]  CC: Primary care physician; Mable Paris, PA-C

## 2019-03-04 NOTE — Progress Notes (Addendum)
Physical Therapy Treatment Patient Details Name: Maxwell Miller MRN: [id number] DOB: Apr 09, 1961 Today's Date: 03/04/2019    History of Present Illness Maxwell Miller is a 58 yo male who [facility] c AMS, confusion, and fever, admitted with sepsis and related encephalopathy. 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 Pt showed good improvement in language fluency working with SLP, but memory and attention deficits persisted despite OT services. PTA pt was living with his spouse, AMB freely without assistive device or difficulty, but needed assistance with ADL/IADL and close supervision due to lability and severe ST/LT memory impairment. [facility], pt hd also made good progress working with psychiatry.  PMHx: portal vein thrombosis, CVA, HTN, erythrocytosis.     PT Comments    Patient tolerated treatment well and is making some progress towards goals this treatment session. He continues to demo flat affect and is argumentative at times. Sleepy today, closing eyes frequently during treatment session. Agreed to participate in functional mobility completed today. Patient did not have any withdrawal reaction to noxious stimuli applied to right toes and was not able to advance R leg of EOB even with prolonged time provided and heavy cuing. He was able to do more automated tasks such as turn off/on sound on TV with remote control with normal movement patterns, but had limited ability to move arms and legs on command. Patient completed sit <> stand to RW with mod A +2. x5 trials from edge of bed to chair, then repeatedly from chair. Required heavy multimodal cuing. Completed ambulation x 3 trials 7-4 feet each with RW and mod-max A +3 (one person at each side, and one person for chair follow). required physical assist to  block/stablize walker, advance feet, assist with weight shift, provide physical assist to place hands, extend knees, extend hips. Patient demo stooped posture at times, closed eyes, buckling knees. HR and O2 sat remained WFL throughout session. Patient would benefit from continued physical therapy to address remaining impairments and functional limitations to work towards stated goals and return to PLOF or maximal functional independence.     Follow Up Recommendations  SNF     Equipment Recommendations  Wheelchair (measurements PT);Wheelchair cushion (measurements PT);3in1 (PT)    Recommendations for Other Services       Precautions / Restrictions Precautions Precautions: Fall Restrictions Weight Bearing Restrictions: No    Mobility  Bed Mobility Overal bed mobility: Needs Assistance Bed Mobility: Supine to Sit     Supine to sit: Mod assist     General bed mobility comments: Required physical assistance to advance arms and legs, received extended time and cuing to advance right LE off bed and complete activity, but eventually provided physical assist. Patient did not respond to noxious stimuli applied to right toes. Wanted to argue about the sock and glove being why he could not feel well with his R foot.   Transfers Overall transfer level: Needs assistance Equipment used: Rolling walker (2 wheeled) Transfers: Sit to/from Stand Sit to Stand: +2 physical assistance;Mod assist         General transfer comment: Patient completed sit <> stand to RW with mod A +2. x5 trials from edge of bed to chair, then repeatedly from chair. Required heavy multimodal cuing.  Ambulation/Gait Ambulation/Gait assistance: Mod assist;Max assist;+2 physical assistance;+2 safety/equipment(no appropriate at this time) Gait Distance (Feet): 7 Feet  Assistive device: Rolling walker (2 wheeled) Gait Pattern/deviations: Step-to pattern;Staggering right Gait velocity: decreased   General Gait Details:  Completed ambulation x 3 trials 7-4 feet each with RW and mod-max A +3 (one person at each side, and one person for chair follow). required physical assist to block/stablize walker, advance feet, assist with weight shift, provide physical assist to place hands, extend knees, extend hips. Patient demo stooped posture at times, closed eyes, buckling knees.    Stairs             Wheelchair Mobility    Modified Rankin (Stroke Patients Only)       Balance Overall balance assessment: Needs assistance Sitting-balance support: Feet supported Sitting balance-Leahy Scale: Fair Sitting balance - Comments: sits at EOB for several minutes, with supervision at first, then required intermittant min A to prevent backwards and lateral leans Postural control: Posterior lean;Right lateral lean;Left lateral lean Standing balance support: Bilateral upper extremity supported Standing balance-Leahy Scale: Poor Standing balance comment: Patient reliant on RW for support; requires assistance for stabilization and support max A +2                            Cognition Arousal/Alertness: Lethargic Behavior During Therapy: Agitated;Flat affect(argumentative at times; able to be calmed with accomodating conversation and asking permission prior to each task) Overall Cognitive Status: History of cognitive impairments - at baseline Area of Impairment: Memory;Following commands;Problem solving;Attention;Safety/judgement                   Current Attention Level: Selective Memory: Decreased recall of precautions;Decreased short-term memory Following Commands: Follows one step commands inconsistently Safety/Judgement: Decreased awareness of safety   Problem Solving: Slow processing;Requires tactile cues;Decreased initiation;Difficulty sequencing;Requires verbal cues General Comments: Baseline heavy ST/LT memory impairment. Pt also has a history of aphasia, now still seems to struggle with  language in describing his symptoms and complaints. Seeks to engage in arguments at times       Exercises      General Comments General comments (skin integrity, edema, etc.): extra time given throughout session for processing, initiation of movement, and encouragement to participate      Pertinent Vitals/Pain Pain Assessment: Faces Faces Pain Scale: Hurts little more Pain Location: right knee during weight bearing activities Pain Intervention(s): Monitored during session    Home Living                      Prior Function            PT Goals (current goals can now be found in the care plan section) Acute Rehab PT Goals Patient Stated Goal: Return to walking and being able to go places PT Goal Formulation: With patient Time For Goal Achievement: 02/27/19 Potential to Achieve Goals: Fair Progress towards PT goals: Progressing toward goals(slowly)    Frequency    Min 2X/week      PT Plan Current plan remains appropriate    Co-evaluation              AM-PAC PT "6 Clicks" Mobility   Outcome Measure  Help needed turning from your back to your side while in a flat bed without using bedrails?: A Lot Help needed moving from lying on your back to sitting on the side of a flat bed without using bedrails?: A Lot Help needed moving to and from a bed to a chair (including a wheelchair)?: A Lot Help needed standing  up from a chair using your arms (e.g., wheelchair or bedside chair)?: A Lot Help needed to walk in hospital room?: A Lot Help needed climbing 3-5 steps with a railing? : Total 6 Click Score: 11    End of Session Equipment Utilized During Treatment: Gait belt Activity Tolerance: Patient limited by fatigue;Patient limited by pain Patient left: with call bell/phone within reach;in chair;with chair alarm set;with nursing/sitter in room Nurse Communication: Mobility status(results of session) PT Visit Diagnosis: Unsteadiness on feet (R26.81);Other  abnormalities of gait and mobility (R26.89);Adult, failure to thrive (R62.7)     Time: 1740-8144 PT Time Calculation (min) (ACUTE ONLY): 26 min  Charges:  $Gait Training: 8-22 mins $Therapeutic Activity: 8-22 mins                     Luretha Murphy. Ilsa Iha, PT, DPT 03/04/19, 1:26 PM

## 2019-03-04 NOTE — Progress Notes (Signed)
Nutrition Follow-up  RD working remotely.  DOCUMENTATION CODES:   Non-severe (moderate) malnutrition in context of social or environmental circumstances  INTERVENTION:  Continue Ensure Enlive po BID, each supplement provides 350 kcal and 20 grams of protein.  NUTRITION DIAGNOSIS:   Moderate Malnutrition related to social / environmental circumstances as evidenced by mild fat depletion, moderate fat depletion, mild muscle depletion, moderate muscle depletion.  Ongoing - addressing with nutrition interventions.  GOAL:   Patient will meet greater than or equal to 90% of their needs  Progressing.  MONITOR:   PO intake, Supplement acceptance, Weight trends, I & O's  REASON FOR ASSESSMENT:   LOS    ASSESSMENT:   58 year old male who presented to the ED on 3/10 with AMS. PMH of stroke, HTN, memory loss. Pt admitted with sepsis.  Patient's appetite and PO intake remains good. Yesterday he ate 100% of breakfast and lunch and 75% of dinner. He continues to drink two bottles of Ensure Enlive per day and is also eating snacks. Abdomen soft per RN documentation. Appears to be having more regular bowel movements this week. Skin remains intact. Still pending legal guardianship and placement.  Medications reviewed and include: Eliquis, folic acid 1 mg daily, Remeron 15 mg QHS, MVI daily, senna-docusate 2 tablets BID, Flomax, thiamine 100 mg daily.  Labs reviewed.  Diet Order:   Diet Order            Diet - low sodium heart healthy        Diet regular Room service appropriate? Yes; Fluid consistency: Thin  Diet effective now             EDUCATION NEEDS:   Education needs have been addressed  Skin:  Skin Assessment: Reviewed RN Assessment  Last BM:  03/03/2019 - large type 5  Height:   Ht Readings from Last 1 Encounters:  01/20/19 5\' 11"  (1.803 m)   Weight:   Wt Readings from Last 1 Encounters:  02/12/19 87.6 kg   Ideal Body Weight:  78.2 kg  BMI:  Body mass index  is 26.93 kg/m.  Estimated Nutritional Needs:   Kcal:  2150-2350  Protein:  110-125 grams  Fluid:  >/= 2.2 L  Helane Rima, MS, RD, LDN Office: [phone] Pager: [phone] After Hours/Weekend Pager: [phone]

## 2019-03-04 NOTE — Progress Notes (Signed)
PT Cancellation Note  Patient Details Name: Maxwell Miller MRN: [id number] DOB: [birth year]   Cancelled Treatment:    Reason Eval/Treat Not Completed: Patient declined, no reason specified   Luretha Murphy. Ilsa Iha, PT, DPT 03/04/19, 9:04 AM

## 2019-03-05 LAB — CBC
HCT: 46.7 % (ref 39.0–52.0)
Hemoglobin: 15.7 g/dL (ref 13.0–17.0)
MCH: 29.7 pg (ref 26.0–34.0)
MCHC: 33.6 g/dL (ref 30.0–36.0)
MCV: 88.4 fL (ref 80.0–100.0)
Platelets: 170 10*3/uL (ref 150–400)
RBC: 5.28 MIL/uL (ref 4.22–5.81)
RDW: 16.9 % — ABNORMAL HIGH (ref 11.5–15.5)
WBC: 7.8 10*3/uL (ref 4.0–10.5)
nRBC: 0 % (ref 0.0–0.2)

## 2019-03-05 NOTE — Progress Notes (Signed)
Physical Therapy Treatment Patient Details Name: Maxwell Miller Costilow MRN: [id number] DOB: [birth year] Today's Date: 03/05/2019    History of Present Illness Maxwell Miller Fleece is a 58 yo male who [facility] c AMS, confusion, and fever, admitted with sepsis and related encephalopathy. 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 Pt showed good improvement in language fluency working with SLP, but memory and attention deficits persisted despite OT services. PTA pt was living with his spouse, AMB freely without assistive device or difficulty, but needed assistance with ADL/IADL and close supervision due to lability and severe ST/LT memory impairment. [facility], pt hd also made good progress working with psychiatry.  PMHx: portal vein thrombosis, CVA, HTN, erythrocytosis.     PT Comments    Pt received up in chair, agreeable to participate. Pt asks for assistance with getting to toilet for BM. Pt assisted for 2 SPT, 1 STS, and then assisted back to bed supine as dinner arrived. Pt puts forth strong effort in maintaining stance at chair with min-modA assist support, but with elbows over chair back is shaking and tremoring 2/2 fatigue in muscle groups. NA assisted with pericare during cleanup. Pt reports he has enjoyed the view out the window and asks if bed can be turned to allow for continued view, which it is. Pt left with NA in room for vitals assessment.    Follow Up Recommendations  SNF     Equipment Recommendations  Wheelchair (measurements PT);Wheelchair cushion (measurements PT);3in1 (PT)    Recommendations for Other Services       Precautions / Restrictions Precautions Precautions: Fall Restrictions Weight Bearing Restrictions: No    Mobility  Bed Mobility Overal bed mobility: Needs Assistance Bed  Mobility: Sit to Supine       Sit to supine: Mod assist(assisted with trunk and legs, pt making attempts to contribute)   General bed mobility comments: received up in chair; [facility] minutes, but given a backward chair for intermittent trunk support  Transfers Overall transfer level: Needs assistance Equipment used: (dependent style SPT) Transfers: Stand Pivot Transfers Sit to Stand: Max assist(assisted STS and holds onto recliner chair back x45sec for pericare) Stand pivot transfers: Max assist;+2 physical assistance(Performed 2 times in session, improved success 2nd time)       General transfer comment: MaxA for lift; totalA for pivot. Pt struggle swth taking small steps needed for pivot [facility]   Ambulation/Gait                 Stairs             Wheelchair Mobility    Modified Rankin (Stroke Patients Only)       Balance Overall balance assessment: Needs assistance                                          Cognition Arousal/Alertness: Awake/alert Behavior During Therapy: WFL for tasks assessed/performed Overall Cognitive Status: History of cognitive impairments - at baseline(WFL for participating in PT session)                                 General Comments: Baseline heavy ST/LT memory impairment. Pt also has  a history of aphasia, now still seems to struggle with language in describing his symptoms and complaints.       Exercises      General Comments        Pertinent Vitals/Pain Pain Assessment: No/denies pain    Home Living                      Prior Function            PT Goals (current goals can now be found in the care plan section) Acute Rehab PT Goals Patient Stated Goal: Return to walking and being able to go places PT Goal Formulation: With patient Time For Goal Achievement: 03/13/19 Potential to Achieve Goals: Poor Progress towards PT goals: PT to reassess next  treatment    Frequency    Min 2X/week      PT Plan Current plan remains appropriate    Co-evaluation              AM-PAC PT "6 Clicks" Mobility   Outcome Measure  Help needed turning from your back to your side while in a flat bed without using bedrails?: A Lot Help needed moving from lying on your back to sitting on the side of a flat bed without using bedrails?: A Lot Help needed moving to and from a bed to a chair (including a wheelchair)?: A Lot Help needed standing up from a chair using your arms (e.g., wheelchair or bedside chair)?: A Lot Help needed to walk in hospital room?: A Lot Help needed climbing 3-5 steps with a railing? : Total 6 Click Score: 11    End of Session Equipment Utilized During Treatment: Gait belt Activity Tolerance: Patient limited by fatigue;Patient limited by pain Patient left: with call bell/phone within reach;with chair alarm set;with nursing/sitter in room;in bed Nurse Communication: Mobility status PT Visit Diagnosis: Unsteadiness on feet (R26.81);Other abnormalities of gait and mobility (R26.89);Adult, failure to thrive (R62.7)     Time: 2446-2863 PT Time Calculation (min) (ACUTE ONLY): 40 min  Charges:  $Therapeutic Activity: 8-22 mins                     4:54 PM, 03/05/19 [name], PT, DPT Physical Therapist - [facility]  [phone] (ASCOM)     Yanixan Mellinger C 03/05/2019, 4:49 PM

## 2019-03-05 NOTE — Progress Notes (Signed)
[employer] [facility] [place] [facility]   PATIENT NAME: Maxwell GurneyHansy Miller    MR#:  [id number]  DATE OF BIRTH:  [birth year]  SUBJECTIVE:  CHIEF COMPLAINT:   Chief Complaint  Patient presents with  . Altered Mental Status   -Awaiting guardianship and placement -Rapidly progressing dementia.  Complains of right knee pain today.  No swelling noted  REVIEW OF SYSTEMS:  Review of Systems  Constitutional: Negative for chills, fever and malaise/fatigue.  HENT: Negative for hearing loss.   Respiratory: Negative for cough, shortness of breath and wheezing.   Cardiovascular: Negative for chest pain and palpitations.  Gastrointestinal: Negative for abdominal pain, constipation, diarrhea, nausea and vomiting.  Genitourinary: Negative for dysuria.  Musculoskeletal: Positive for joint pain. Negative for myalgias.       Right knee pain  Neurological: Negative for dizziness, focal weakness, seizures, weakness and headaches.  Psychiatric/Behavioral: Negative for depression.    DRUG ALLERGIES:  No Known Allergies  VITALS:  Blood pressure 134/87, pulse 71, temperature 97.8 F (36.6 C), temperature source Oral, resp. rate 18, height 5\' 11"  (1.803 m), weight 87.6 kg, SpO2 97 %.  PHYSICAL EXAMINATION:  Physical Exam   GENERAL:  58 y.o.-year-old patient lying in the bed with no acute distress.  EYES: Pupils equal, round, reactive to light and accommodation. No scleral icterus. Extraocular muscles intact.  HEENT: Head atraumatic, normocephalic. Oropharynx and nasopharynx clear.  NECK:  Supple, no jugular venous distention. No thyroid enlargement, no tenderness.  LUNGS: Normal breath sounds bilaterally, no wheezing, rales,rhonchi or crepitation. No use of accessory muscles of respiration.  CARDIOVASCULAR: S1, S2 normal. No murmurs, rubs, or gallops.  ABDOMEN: Soft, nontender, nondistended. Bowel sounds present. No organomegaly or mass.  EXTREMITIES: No pedal edema, cyanosis, or  clubbing.  No swelling of right knee, no limitation of motion noted NEUROLOGIC: Cranial nerves II through XII are intact. Muscle strength 5/5 in all extremities. Sensation intact. Gait not checked.  PSYCHIATRIC: The patient is alert but completely disoriented SKIN: No obvious rash, lesion, or ulcer.    LABORATORY PANEL:   CBC Recent Labs  Lab 03/05/19 0913  WBC 7.8  HGB 15.7  HCT 46.7  PLT 170   ------------------------------------------------------------------------------------------------------------------  Chemistries  No results for input(s): NA, K, CL, CO2, GLUCOSE, BUN, CREATININE, CALCIUM, MG, AST, ALT, ALKPHOS, BILITOT in the last 168 hours.  Invalid input(s): GFRCGP ------------------------------------------------------------------------------------------------------------------  Cardiac Enzymes No results for input(s): TROPONINI in the last 168 hours. ------------------------------------------------------------------------------------------------------------------  RADIOLOGY:  No results found.  EKG:   Orders placed or performed during the hospital encounter of 01/20/19  . EKG 12-Lead  . EKG 12-Lead  . ED EKG 12-Lead  . ED EKG 12-Lead    ASSESSMENT AND PLAN:   58 year old male with history of intracranial hemorrhage x2, short-term memory loss and erythrocytosis who presents with generalized weakness, confusion and fever.  *Acute Sepsis -on admission-  due to proctitis and now resolved. [name] in the blood on 01/20/19 Completed course of antibiotics with p.o. Bactrim - currently afebrile, hemodynamically stable.   *Confusion, short-term memory loss  ?? Had rapidly progressive dementia - ? Underlying vascular dementia as well. 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 No acute findings or no features of meningitis seen. - mental status currently at baseline.     *Acute depression -  improved. Continue Remeron  *Portal vein thrombosis - Continue eliquis. No acute issues  *Generalized weakness PT recommending SNF/STR but pt. Is awaiting  guardianship.   *Hypertension, chronic Continue Norvasc  Patient awaiting legal guardianship and has no safe disposition presently.  Adult Protective Services have been identified.  Await further disposition guidance as per social work.     All the records are reviewed and case discussed with Care Management/Social Workerr. Management plans discussed with the patient, family and they are in agreement.  CODE STATUS: Full Code  TOTAL TIME TAKING CARE OF THIS PATIENT: 28 minutes.   POSSIBLE D/C IN ? DAYS, DEPENDING ON CLINICAL CONDITION.   Enid Baas M.D on 03/05/2019 at 11:46 AM  Between 7am to 6pm - Pager - [phone]  After 6pm go to [email or web address] - Social research officer, government  [facility]  Office  [phone]  CC: Primary care physician; Mable Paris, PA-C

## 2019-03-06 NOTE — Plan of Care (Signed)
  Problem: Skin Integrity: Goal: Risk for impaired skin integrity will decrease Outcome: Progressing   Problem: Activity: Goal: Risk for activity intolerance will decrease Outcome: Progressing   Problem: Safety: Goal: Ability to remain free from injury will improve Outcome: Progressing   Problem: Skin Integrity: Goal: Risk for impaired skin integrity will decrease Outcome: Progressing   

## 2019-03-06 NOTE — Progress Notes (Addendum)
[facility] - [place] [facility]   PATIENT NAME: Maxwell Miller    MR#:  [id number]  DATE OF BIRTH:  04-24-1961  SUBJECTIVE:  CHIEF COMPLAINT:   Chief Complaint  Patient presents with  . Altered Mental Status   -Awaiting guardianship and then placement -Rapidly progressing dementia.  No complaints  REVIEW OF SYSTEMS:  Review of Systems  Constitutional: Negative for chills, fever and malaise/fatigue.  HENT: Negative for hearing loss.   Respiratory: Negative for cough, shortness of breath and wheezing.   Cardiovascular: Negative for chest pain and palpitations.  Gastrointestinal: Negative for abdominal pain, constipation, diarrhea, nausea and vomiting.  Genitourinary: Negative for dysuria.  Musculoskeletal: Positive for joint pain. Negative for myalgias.       Right knee pain  Neurological: Negative for dizziness, focal weakness, seizures, weakness and headaches.  Psychiatric/Behavioral: Negative for depression.    DRUG ALLERGIES:  No Known Allergies  VITALS:  Blood pressure 136/88, pulse 87, temperature 98.8 F (37.1 C), temperature source Oral, resp. rate 18, height 5\' 11"  (1.803 m), weight 87.6 kg, SpO2 96 %.  PHYSICAL EXAMINATION:  Physical Exam   GENERAL:  58 y.o.-year-old patient lying in the bed with no acute distress.  EYES: Pupils equal, round, reactive to light and accommodation. No scleral icterus. Extraocular muscles intact.  HEENT: Head atraumatic, normocephalic. Oropharynx and nasopharynx clear.  NECK:  Supple, no jugular venous distention. No thyroid enlargement, no tenderness.  LUNGS: Normal breath sounds bilaterally, no wheezing, rales,rhonchi or crepitation. No use of accessory muscles of respiration.  CARDIOVASCULAR: S1, S2 normal. No murmurs, rubs, or gallops.  ABDOMEN: Soft, nontender, nondistended. Bowel sounds present. No organomegaly or mass.  EXTREMITIES: No pedal edema, cyanosis, or clubbing.  No swelling of right knee,  no limitation of motion noted NEUROLOGIC: Cranial nerves II through XII are intact. Muscle strength 5/5 in all extremities. Sensation intact. Gait not checked.  PSYCHIATRIC: The patient is alert but completely disoriented SKIN: No obvious rash, lesion, or ulcer.    LABORATORY PANEL:   CBC Recent Labs  Lab 03/05/19 0913  WBC 7.8  HGB 15.7  HCT 46.7  PLT 170   ------------------------------------------------------------------------------------------------------------------  Chemistries  No results for input(s): NA, K, CL, CO2, GLUCOSE, BUN, CREATININE, CALCIUM, MG, AST, ALT, ALKPHOS, BILITOT in the last 168 hours.  Invalid input(s): GFRCGP ------------------------------------------------------------------------------------------------------------------  Cardiac Enzymes No results for input(s): TROPONINI in the last 168 hours. ------------------------------------------------------------------------------------------------------------------  RADIOLOGY:  No results found.  EKG:   Orders placed or performed during the hospital encounter of 01/20/19  . EKG 12-Lead  . EKG 12-Lead  . ED EKG 12-Lead  . ED EKG 12-Lead    ASSESSMENT AND PLAN:   58 year old male with history of intracranial hemorrhage x2, short-term memory loss and erythrocytosis who presents with generalized weakness, confusion and fever.  *Acute Sepsis -on admission-  due to proctitis and now resolved. [name] in the blood on 01/20/19 Completed course of antibiotics with p.o. Bactrim - currently afebrile, hemodynamically stable.   *Confusion, short-term memory loss  ?? Had rapidly progressive dementia - ? Underlying vascular dementia as well. 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 No acute findings or no features of meningitis seen. - mental status currently at baseline.     *Acute depression - improved. Continue Remeron  *Portal  vein thrombosis - Continue eliquis. No acute issues  *Generalized weakness PT recommending SNF/STR but pt. Is awaiting guardianship.   *Hypertension, chronic Continue Norvasc  Patient awaiting legal guardianship and has no safe disposition presently.  will need rehab at discharge. - Adult Protective Services have been identified.  Await further disposition guidance as per social work.  - COVID 19 test ordered on 03/06/19 for rehab placement   All the records are reviewed and case discussed with Care Management/Social Workerr. Management plans discussed with the patient, family and they are in agreement.  CODE STATUS: Full Code  TOTAL TIME TAKING CARE OF THIS PATIENT: 26 minutes.   POSSIBLE D/C IN ? DAYS, DEPENDING ON CLINICAL CONDITION.   Enid Baas M.D on 03/06/2019 at 12:02 PM  Between 7am to 6pm - Pager - [phone]  After 6pm go to [email or web address] - Social research officer, government  [facility] [place]  Office  [phone]  CC: Primary care physician; Mable Paris, PA-C

## 2019-03-06 NOTE — TOC Progression Note (Signed)
Transition of Care [facility]) - Progression Note    Patient Details  Name: Maxwell Miller MRN: [id number] Date of Birth: 1961/04/01  Transition of Care [facility]) CM/SW Contact  Maxwell Dunker, RN Phone Number: 03/06/2019, 11:23 AM  Clinical Narrative:    Maxwell Miller Monday to Maxwell Miller at [phone] She has sent the petition to the court to obtain the Guardianship She is awaiting on responce to verify Long term Medicaid application is in  She has gotten in touch with family but they live in [place] or [place] One niece has talked to the patient and she lives in [place] but not local  Maxwell Miller will give the niece my contact inforamation and request her to call so that we can get her contact information    Expected Discharge Plan: Home w Home Health Services Barriers to Discharge: Family Issues, Transportation  Expected Discharge Plan and Services Expected Discharge Plan: Home w Home Health Services   Discharge Planning Services: CM Consult Post Acute Care Choice: Home Health, Durable Medical Equipment   Expected Discharge Date: 01/24/19                         HH Arranged: RN, PT, Nurse's Aide HH Agency: [facility]         Social Determinants of Health (SDOH) Interventions    Readmission Risk Interventions No flowsheet data found.
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
## 2019-03-06 NOTE — TOC Progression Note (Signed)
Transition of Care [facility]) - Progression Note    Patient Details  Name: Maxwell Miller MRN: [id number] Date of Birth: 11/08/1961  Transition of Care [facility]) CM/SW Contact  Barrie Dunker, RN Phone Number: 03/06/2019, 3:13 PM  Clinical Narrative:    Arthur Holms called and provided the patient's niece's information for contact her name is Maxwell Miller phone number is [phone]    Expected Discharge Plan: Home w Home Health Services Barriers to Discharge: Family Issues, Transportation  Expected Discharge Plan and Services Expected Discharge Plan: Home w Home Health Services   Discharge Planning Services: CM Consult Post Acute Care Choice: Home Health, Durable Medical Equipment   Expected Discharge Date: 01/24/19                         HH Arranged: RN, PT, Nurse's Aide HH Agency: [facility]         Social Determinants of Health (SDOH) Interventions    Readmission Risk Interventions No flowsheet data found.

## 2019-03-07 ENCOUNTER — Inpatient Hospital Stay: Payer: Medicaid Other

## 2019-03-07 LAB — BASIC METABOLIC PANEL
Anion gap: 7 (ref 5–15)
BUN: 11 mg/dL (ref 6–20)
CO2: 25 mmol/L (ref 22–32)
Calcium: 9 mg/dL (ref 8.9–10.3)
Chloride: 105 mmol/L (ref 98–111)
Creatinine, Ser: 0.88 mg/dL (ref 0.61–1.24)
GFR calc Af Amer: >60 mL/min (ref >60)
GFR calc non Af Amer: >60 mL/min (ref >60)
Glucose, Bld: 139 mg/dL — ABNORMAL HIGH (ref 70–99)
Potassium: 3.7 mmol/L (ref 3.5–5.1)
Sodium: 137 mmol/L (ref 135–145)

## 2019-03-07 LAB — CBC
HCT: 46.7 % (ref 39.0–52.0)
Hemoglobin: 15.3 g/dL (ref 13.0–17.0)
MCH: 29.3 pg (ref 26.0–34.0)
MCHC: 32.8 g/dL (ref 30.0–36.0)
MCV: 89.3 fL (ref 80.0–100.0)
Platelets: 167 10*3/uL (ref 150–400)
RBC: 5.23 MIL/uL (ref 4.22–5.81)
RDW: 16.9 % — ABNORMAL HIGH (ref 11.5–15.5)
WBC: 7.3 10*3/uL (ref 4.0–10.5)
nRBC: 0 % (ref 0.0–0.2)

## 2019-03-07 LAB — NOVEL CORONAVIRUS, NAA (HOSP ORDER, SEND-OUT TO REF LAB; TAT 18-24 HRS): SARS-CoV-2, NAA: NOT DETECTED

## 2019-03-07 NOTE — Progress Notes (Signed)
Physical Therapy Treatment Patient Details Name: Maxwell Miller MRN: [id number] DOB: [birth year] Today's Date: 03/07/2019    History of Present Illness Maxwell Miller is a 58 yo male who [facility] to [facility]19 c AMS, confusion, and fever, admitted with sepsis and related encephalopathy. 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 Pt showed good improvement in language fluency working with SLP, but memory and attention deficits persisted despite OT services. PTA pt was living with his spouse, AMB freely without assistive device or difficulty, but needed assistance with ADL/IADL and close supervision due to lability and severe ST/LT memory impairment. [facility], pt hd also made good progress working with psychiatry.  PMHx: portal vein thrombosis, CVA, HTN, erythrocytosis.     PT Comments    Pt presently laying in bed. He continues to demonstrate confused behavior. He continues to be a MaxA +1 with bed mobility as well with sit to stand transfers and standing pivot with RW. Worked on sit to stand x 5 standing for 2 min ea. Then transferred into recliner in order for his sheets to be changed and a don a new gown due to urinating in the bed. Pt left in recliner with it turned towards the window per pt's request at end of session and nursing notified. He would benefit from physical therapy to promote transfers, ambulation and general mobility but per limited function/ motivation is making no progress toward his current goals.   Follow Up Recommendations  SNF     Equipment Recommendations  Wheelchair (measurements PT);Wheelchair cushion (measurements PT);3in1 (PT)    Recommendations for Other Services       Precautions / Restrictions Restrictions Weight Bearing Restrictions: No    Mobility  Bed Mobility Overal bed  mobility: Needs Assistance Bed Mobility: Sit to Supine     Supine to sit: Max assist(+1)        Transfers Overall transfer level: Needs assistance Equipment used: Rolling walker (2 wheeled) Transfers: Stand Pivot Transfers(with RW) Sit to Stand: Max assist(+1) Stand pivot transfers: Max assist          Ambulation/Gait                 Stairs             Wheelchair Mobility    Modified Rankin (Stroke Patients Only)       Balance Overall balance assessment: Needs assistance Sitting-balance support: Feet supported Sitting balance-Leahy Scale: Good Sitting balance - Comments: after sitting on EOB for 5 min pt begins to exhibit posterior trunk lean requiring intermittent MOD assist with sitting balance Postural control: Posterior lean                                  Cognition Arousal/Alertness: Awake/alert                                            Exercises Other Exercises Other Exercises: sit to stand x 5 from EOB (requiring verbal cues for hand placement), standing for 2 min ea before sitting down    General Comments        Pertinent Vitals/Pain Pain Assessment: No/denies pain    Home Living  Prior Function            PT Goals (current goals can now be found in the care plan section) Acute Rehab PT Goals Patient Stated Goal: Return to walking and being able to go places PT Goal Formulation: With patient Time For Goal Achievement: 03/13/19 Potential to Achieve Goals: Poor Progress towards PT goals: PT to reassess next treatment    Frequency    Min 2X/week      PT Plan Current plan remains appropriate    Co-evaluation              AM-PAC PT "6 Clicks" Mobility   Outcome Measure  Help needed turning from your back to your side while in a flat bed without using bedrails?: A Lot Help needed moving from lying on your back to sitting on the side of a flat bed  without using bedrails?: A Lot Help needed moving to and from a bed to a chair (including a wheelchair)?: A Lot Help needed standing up from a chair using your arms (e.g., wheelchair or bedside chair)?: A Lot Help needed to walk in hospital room?: A Lot Help needed climbing 3-5 steps with a railing? : Total 6 Click Score: 11    End of Session Equipment Utilized During Treatment: Gait belt Activity Tolerance: Patient limited by fatigue;Patient limited by pain Patient left: with call bell/phone within reach;with chair alarm set;with nursing/sitter in room;in bed Nurse Communication: Mobility status PT Visit Diagnosis: Unsteadiness on feet (R26.81);Other abnormalities of gait and mobility (R26.89);Adult, failure to thrive (R62.7)     Time: 1430-1500 PT Time Calculation (min) (ACUTE ONLY): 30 min  Charges:  $Therapeutic Activity: 23-37 mins                     Maxwell Miller PT, DPT, LAT, ATC  03/07/19  3:12 PM        Maxwell Miller 03/07/2019, 3:08 PM

## 2019-03-07 NOTE — Progress Notes (Signed)
Patient ID: Maxwell Miller, male   DOB: [birth year], 58 y.o.   MRN: [id number]  [employer] PROGRESS NOTE  Maxwell Miller [id number] DOB: [birth year] DOA: 01/20/2019 PCP: Mable ParisWilliams, Katrina, PA-C  HPI/Subjective: Patient answers a few yes or no questions.  Have difficulty elaborating on anything.  Patient could not tell me how long his right leg weakness has been going on.  Objective: Vitals:   03/06/19 1951 03/07/19 0511  BP: 138/81 130/88  Pulse: 93 73  Resp: 18 19  Temp: 98.5 F (36.9 C) 97.7 F (36.5 C)  SpO2: 99% 98%    Filed Weights   01/20/19 0826 01/29/19 0340 02/12/19 0437  Weight: 81.6 kg 86 kg 87.6 kg    ROS: Review of Systems  Constitutional: Negative for chills and fever.  Eyes: Negative for blurred vision.  Respiratory: Negative for cough and shortness of breath.   Cardiovascular: Negative for chest pain.  Gastrointestinal: Negative for abdominal pain, constipation, diarrhea, nausea and vomiting.  Genitourinary: Negative for dysuria.  Musculoskeletal: Positive for joint pain.  Neurological: Negative for dizziness and headaches.   Exam: Physical Exam  HENT:  Nose: No mucosal edema.  Mouth/Throat: No oropharyngeal exudate or posterior oropharyngeal edema.  Eyes: Pupils are equal, round, and reactive to light. Conjunctivae, EOM and lids are normal.  Neck: No JVD present. Carotid bruit is not present. No edema present. No thyroid mass and no thyromegaly present.  Cardiovascular: S1 normal and S2 normal. Exam reveals no gallop.  No murmur heard. Pulses:      Dorsalis pedis pulses are 2+ on the right side and 2+ on the left side.  Respiratory: No respiratory distress. He has no wheezes. He has no rhonchi. He has no rales.  GI: Soft. Bowel sounds are normal. There is no abdominal tenderness.  Musculoskeletal:     Right ankle: He exhibits no swelling.     Left ankle: He exhibits no swelling.  Lymphadenopathy:    He has no cervical adenopathy.   Neurological: He is alert. No cranial nerve deficit.  Unable to straight leg raise with his right leg.  Skin: Skin is warm. No rash noted. Nails show no clubbing.  Psychiatric: He has a normal mood and affect.      Data Reviewed: Basic Metabolic Panel: Recent Labs  Lab 03/07/19 0510  NA 137  K 3.7  CL 105  CO2 25  GLUCOSE 139*  BUN 11  CREATININE 0.88  CALCIUM 9.0   CBC: Recent Labs  Lab 03/05/19 0913 03/07/19 0510  WBC 7.8 7.3  HGB 15.7 15.3  HCT 46.7 46.7  MCV 88.4 89.3  PLT 170 167     Recent Results (from the past 240 hour(s))  Novel Coronavirus, NAA (hospital order; send-out to ref lab)     Status: None   Collection Time: 03/06/19 11:07 AM  Result Value Ref Range Status   SARS-CoV-2, NAA NOT DETECTED NOT DETECTED Final    Comment: (NOTE) This test was developed and its performance characteristics determined by [facility]. This test has not been FDA cleared or approved. This test has been authorized by FDA under an Emergency Use Authorization (EUA). This test has been validated in accordance with the FDA's Guidance Document (Policy for Diagnostics Testing in Laboratories Certified to Perform High Complexity Testing under CLIA prior to Emergency Use Authorization for Coronavirus Disease-2019 during the [facility]) issued on February 29th, 2020. FDA independent review of this validation is pending. This test is only authorized for the duration of  time the declaration that circumstances exist justifying the authorization of the emergency use of in vitro diagnostic tests for detection of SARS-CoV- 2 virus and/or diagnosis of COVID-19 infection under section 564(b)(1) of the Act, 21 U.S.C. 096KRC-3(K)(1), unless the authorization is terminated or revoked sooner. Performed At: [facility] [address] [place], [place] [id number] Jolene Schimke MD GO:[id number]    Coronavirus Source NASOPHARYNGEAL  Final    Comment:  Performed at [facility], [address] [place] [place], [place] [address]     Studies: Ct Head Wo Contrast  Result Date: 03/07/2019 CLINICAL DATA:  Continued RIGHT leg weakness since admission. Dementia. EXAM: CT HEAD WITHOUT CONTRAST TECHNIQUE: Contiguous axial images were obtained from the base of the skull through the vertex without intravenous contrast. COMPARISON:  CT head 01/20/2019. FINDINGS: Brain: No evidence for acute infarction, hemorrhage, mass lesion, hydrocephalus, or extra-axial fluid. Cerebral and cerebellar atrophy, premature for age. Extensive hypoattenuation of white matter, likely small vessel disease. Vascular: No hyperdense vessel or unexpected calcification. Skull: Calvarium intact. No skull base lesion. Sinuses/Orbits: No acute sinus disease. Negative visualized orbits. Other: No mastoid or middle ear fluid. Scalp soft tissues unremarkable. IMPRESSION: Premature for age atrophy. White matter hypoattenuation most consistent with advanced small vessel disease. No acute intracranial findings. Electronically Signed   By: Elsie Stain M.D.   On: 03/07/2019 13:12    Scheduled Meds: . amLODipine  10 mg Oral Daily  . apixaban  5 mg Oral BID  . atorvastatin  20 mg Oral q1800  . feeding supplement (ENSURE ENLIVE)  237 mL Oral BID BM  . folic acid  1 mg Oral Daily  . metoprolol tartrate  25 mg Oral BID  . mirtazapine  15 mg Oral QHS  . multivitamin with minerals  1 tablet Oral Daily  . senna-docusate  2 tablet Oral BID  . tamsulosin  0.4 mg Oral Daily  . thiamine  100 mg Oral Daily  . venlafaxine XR  150 mg Oral Q breakfast   Continuous Infusions:  Assessment/Plan:  1. Right lower extremity weakness.  Repeat CT scan of the head negative for acute events.  Patient is on Eliquis already for portal vein thrombosis.  Looking into placement. 2. Sepsis.  [name] in the blood on 01/20/2019.  Completed a course of antibiotics. 3. Acute encephalopathy.  Possibility of rapidly  progressive dementia.  Send off an RPR B12 and TSH.  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 4. Depression on Remeron 5. Portal vein thrombosis on Eliquis 6. Chronic hypertension on Norvasc 7. Hyperlipidemia unspecified on atorvastatin 8. COVID-19 test negative.  It was ordered because we are thinking about rehab.  Not because the patient had symptoms.  Code Status:     Code Status Orders  (From admission, onward)         Start     Ordered   01/20/19 1235  Full code  Continuous     01/20/19 1235        Code Status History    This patient has a current code status but no historical code status.    Advance Directive Documentation     Most Recent Value  Type of Advance Directive  Healthcare Power of Attorney  Pre-existing out of facility DNR order (yellow form or pink MOST form)  -  "MOST" Form in Place?  -     Disposition Plan: Trying to obtain legal guardian.  Antibiotics:  Finished course  Time spent: 40  minutes  [employer]

## 2019-03-08 LAB — TSH: TSH: 3.32 u[IU]/mL (ref 0.350–4.500)

## 2019-03-08 LAB — VITAMIN B12: Vitamin B-12: 382 pg/mL (ref 180–914)

## 2019-03-08 NOTE — Progress Notes (Signed)
Occupational Therapy Treatment Patient Details Name: Maxwell Miller Haskell MRN: [id number] DOB: 1961-06-06 Today's Date: 03/08/2019    History of present illness Maxwell Miller Riordan is a 58 yo male who [facility] on [facility] c AMS, confusion, and fever, admitted with sepsis and related encephalopathy. 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 Pt showed good improvement in language fluency working with SLP, but memory and attention deficits persisted despite OT services. PTA pt was living with his spouse, AMB freely without assistive device or difficulty, but needed assistance with ADL/IADL and close supervision due to lability and severe ST/LT memory impairment. [facility], pt hd also made good progress working with psychiatry.  PMHx: portal vein thrombosis, CVA, HTN, erythrocytosis.    OT comments  Pt seen for OT tx this date. Pt agreeable to participate, appreciative of snacks provided by therapist to support rapport building. Pt sat EOB with Mod A for BLE mgt EOB and tolerated sitting EOB for approx 40 minutes with supervision and no LOB. Near the end pt noting some mild R hip pain/achiness and would support himself on his RUE briefly for improved comfort. Pt engaged in conversation about current events, pt's life, and problem solving situations with therapist. Appropriate thought content and communication throughout. Adjusted positioning of bed to improve pt's sight lines of the window and pt very appreciative. Pt demonstrating improved activity tolerance and sitting balance as well as bed mobility requiring less assist. Pt continues to verbalize that his RLE "just isn't there" and that despite his wanting to move his RLE, it will not move when he wants it to but will move unintentionally. Pt will continue to benefit from skilled OT  to maximize safety/independence and minimize falls risk and caregiver burden.   Follow Up Recommendations  SNF;Supervision/Assistance - 24 hour    Equipment Recommendations  3 in 1 bedside commode    Recommendations for Other Services      Precautions / Restrictions Precautions Precautions: Fall Restrictions Weight Bearing Restrictions: No       Mobility Bed Mobility Overal bed mobility: Needs Assistance Bed Mobility: Supine to Sit;Sit to Supine     Supine to sit: Mod assist Sit to supine: Mod assist   General bed mobility comments: instructed pt in hooking RLE with LLE to assist in bring BLE EOB, requiring Mod A and cues for pushing through RUE on HOB to prop himself up; mod A for return to supine for BLE mgt and cues to come down to sidelying first  Transfers Overall transfer level: Needs assistance   Transfers: Lateral/Scoot Transfers          Lateral/Scoot Transfers: Mod assist General transfer comment: cues for sequencing and Mod A performed lateral scoots to improve positioning in bed    Balance Overall balance assessment: Needs assistance Sitting-balance support: Feet supported Sitting balance-Leahy Scale: Good Sitting balance - Comments: pt able to tolerate sitting EOB for approx 40 min, requiring UE support occasionally when pt reported having mild R hip pain, no LOB or difficulty self correcting                                   ADL either performed or assessed with clinical judgement   ADL Overall ADL's : Needs assistance/impaired Eating/Feeding: Sitting;Set up;Supervision/ safety Eating/Feeding Details (indicate cue type  and reason): seated EOB pt able to self feed snacks provided without LOB                                         Vision Patient Visual Report: No change from baseline     Perception     Praxis      Cognition Arousal/Alertness: Awake/alert Behavior During Therapy: WFL for tasks  assessed/performed Overall Cognitive Status: History of cognitive impairments - at baseline(WFL for session)                                          Exercises     Shoulder Instructions       General Comments      Pertinent Vitals/ Pain       Pain Assessment: 0-10 Faces Pain Scale: Hurts a little bit Pain Location: R hip after sitting EOB for approx 20 min Pain Intervention(s): Monitored during session;Limited activity within patient's tolerance;Repositioned  Home Living                                          Prior Functioning/Environment              Frequency  Min 1X/week        Progress Toward Goals  OT Goals(current goals can now be found in the care plan section)  Progress towards OT goals: Progressing toward goals  Acute Rehab OT Goals Patient Stated Goal: Return to walking and being able to go places OT Goal Formulation: With patient Time For Goal Achievement: 03/18/19 Potential to Achieve Goals: Good  Plan Discharge plan remains appropriate;Frequency remains appropriate    Co-evaluation                 AM-PAC OT "6 Clicks" Daily Activity     Outcome Measure   Help from another person eating meals?: None Help from another person taking care of personal grooming?: None Help from another person toileting, which includes using toliet, bedpan, or urinal?: A Lot Help from another person bathing (including washing, rinsing, drying)?: A Lot Help from another person to put on and taking off regular upper body clothing?: A Little Help from another person to put on and taking off regular lower body clothing?: A Lot 6 Click Score: 17    End of Session    OT Visit Diagnosis: Other abnormalities of gait and mobility (R26.89);Muscle weakness (generalized) (M62.81)   Activity Tolerance Patient tolerated treatment well   Patient Left in bed;with call bell/phone within reach;with bed alarm set   Nurse  Communication          Time: 2395-3202 OT Time Calculation (min): 45 min  Charges: OT General Charges $OT Visit: 1 Visit OT Treatments $Therapeutic Activity: 38-52 mins  Richrd Prime, MPH, MS, OTR/L ascom [phone] 03/08/19, 4:16 PM

## 2019-03-08 NOTE — Progress Notes (Signed)
Patient ID: Maxwell Miller, male   DOB: 15-Jul-1961, 58 y.o.   MRN: [id number]  [employer] PROGRESS NOTE  Maxwell Miller FMB:[id number] DOB: [birth year] DOA: 01/20/2019 PCP: Mable Paris, PA-C  HPI/Subjective: Patient answers a few yes or no questions.  Offers no complaints.  States he cannot move his right leg.  He states that is been going on for a while but cannot elaborate.  Objective: Vitals:   03/07/19 2057 03/08/19 0326  BP: 138/88 129/89  Pulse: 77 66  Resp: 18 18  Temp: 98.4 F (36.9 C) 98.4 F (36.9 C)  SpO2: 98% 98%    Filed Weights   01/20/19 0826 01/29/19 0340 02/12/19 0437  Weight: 81.6 kg 86 kg 87.6 kg    ROS: Review of Systems  Constitutional: Negative for chills and fever.  Eyes: Negative for blurred vision.  Respiratory: Negative for cough and shortness of breath.   Cardiovascular: Negative for chest pain.  Gastrointestinal: Negative for abdominal pain, constipation, diarrhea, nausea and vomiting.  Genitourinary: Negative for dysuria.  Musculoskeletal: Positive for joint pain.  Neurological: Negative for dizziness and headaches.   Exam: Physical Exam  HENT:  Nose: No mucosal edema.  Mouth/Throat: No oropharyngeal exudate or posterior oropharyngeal edema.  Eyes: Pupils are equal, round, and reactive to light. Conjunctivae, EOM and lids are normal.  Neck: No JVD present. Carotid bruit is not present. No edema present. No thyroid mass and no thyromegaly present.  Cardiovascular: S1 normal and S2 normal. Exam reveals no gallop.  No murmur heard. Pulses:      Dorsalis pedis pulses are 2+ on the right side and 2+ on the left side.  Respiratory: No respiratory distress. He has no wheezes. He has no rhonchi. He has no rales.  GI: Soft. Bowel sounds are normal. There is no abdominal tenderness.  Musculoskeletal:     Right ankle: He exhibits no swelling.     Left ankle: He exhibits no swelling.  Lymphadenopathy:    He has no cervical adenopathy.   Neurological: He is alert. No cranial nerve deficit.  Unable to straight leg raise with his right leg.  Patient states he cannot feel his right leg.  Cannot move his toes or his leg.  Skin: Skin is warm. No rash noted. Nails show no clubbing.  Psychiatric: He has a normal mood and affect.      Data Reviewed: Basic Metabolic Panel: Recent Labs  Lab 03/07/19 0510  NA 137  K 3.7  CL 105  CO2 25  GLUCOSE 139*  BUN 11  CREATININE 0.88  CALCIUM 9.0   CBC: Recent Labs  Lab 03/05/19 0913 03/07/19 0510  WBC 7.8 7.3  HGB 15.7 15.3  HCT 46.7 46.7  MCV 88.4 89.3  PLT 170 167     Recent Results (from the past 240 hour(s))  Novel Coronavirus, NAA (hospital order; send-out to ref lab)     Status: None   Collection Time: 03/06/19 11:07 AM  Result Value Ref Range Status   SARS-CoV-2, NAA NOT DETECTED NOT DETECTED Final    Comment: (NOTE) This test was developed and its performance characteristics determined by [employer]. This test has not been FDA cleared or approved. This test has been authorized by FDA under an Emergency Use Authorization (EUA). This test has been validated in accordance with the FDA's Guidance Document Psychologist, prison and probation services for Diagnostics Testing in Laboratories Certified to Perform High Complexity Testing under CLIA prior to Emergency Use Authorization for Coronavirus Disease-2019 during the [facility] Emergency) issued  on February 29th, 2020. FDA independent review of this validation is pending. This test is only authorized for the duration of time the declaration that circumstances exist justifying the authorization of the emergency use of in vitro diagnostic tests for detection of SARS-CoV- 2 virus and/or diagnosis of COVID-19 infection under section 564(b)(1) of the Act, 21 U.S.C. 161WRU-0(A)(5360bbb-3(b)(1), unless the authorization is terminated or revoked sooner. Performed At: [facility]  [facility] [address] [place], [place] [id number] Jolene SchimkeNagendra Sanjai  MD NW:[id number]:[phone]    Coronavirus Source NASOPHARYNGEAL  Final    Comment: Performed at [facility], [address] [place], [place] [address]     Studies: Ct Head Wo Contrast  Result Date: 03/07/2019 CLINICAL DATA:  Continued RIGHT leg weakness since admission. Dementia. EXAM: CT HEAD WITHOUT CONTRAST TECHNIQUE: Contiguous axial images were obtained from the base of the skull through the vertex without intravenous contrast. COMPARISON:  CT head 01/20/2019. FINDINGS: Brain: No evidence for acute infarction, hemorrhage, mass lesion, hydrocephalus, or extra-axial fluid. Cerebral and cerebellar atrophy, premature for age. Extensive hypoattenuation of white matter, likely small vessel disease. Vascular: No hyperdense vessel or unexpected calcification. Skull: Calvarium intact. No skull base lesion. Sinuses/Orbits: No acute sinus disease. Negative visualized orbits. Other: No mastoid or middle ear fluid. Scalp soft tissues unremarkable. IMPRESSION: Premature for age atrophy. White matter hypoattenuation most consistent with advanced small vessel disease. No acute intracranial findings. Electronically Signed   By: Elsie StainJohn T Curnes M.D.   On: 03/07/2019 13:12    Scheduled Meds: . amLODipine  10 mg Oral Daily  . apixaban  5 mg Oral BID  . atorvastatin  20 mg Oral q1800  . feeding supplement (ENSURE ENLIVE)  237 mL Oral BID BM  . folic acid  1 mg Oral Daily  . metoprolol tartrate  25 mg Oral BID  . mirtazapine  15 mg Oral QHS  . multivitamin with minerals  1 tablet Oral Daily  . senna-docusate  2 tablet Oral BID  . tamsulosin  0.4 mg Oral Daily  . thiamine  100 mg Oral Daily  . venlafaxine XR  150 mg Oral Q breakfast    Assessment/Plan:  1. Right lower extremity paralysis.  Repeat CT scan of the head negative for acute events.  Patient is on Eliquis already for portal vein thrombosis.  Looking into placement. 2. Sepsis.  [name] in the blood on 01/20/2019.  Completed a course of  antibiotics. 3. Acute encephalopathy.  Possibility of rapidly progressive dementia.  Send off an RPR.  B12 in normal range.  TSH normal range.  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 4. Depression on Remeron 5. Portal vein thrombosis on Eliquis 6. Chronic hypertension on Norvasc 7. Hyperlipidemia unspecified on atorvastatin 8. COVID-19 test negative.  It was ordered because we are thinking about rehab.  Not because the patient had symptoms.  Code Status:     Code Status Orders  (From admission, onward)         Start     Ordered   01/20/19 1235  Full code  Continuous     01/20/19 1235        Code Status History    This patient has a current code status but no historical code status.    Advance Directive Documentation     Most Recent Value  Type of Advance Directive  Healthcare Power of Attorney  Pre-existing out of facility DNR order (yellow form or pink MOST form)  -  "MOST"  Form in Place?  -     Disposition Plan: Trying to obtain legal guardian.  Antibiotics:  Finished course  Time spent: 27 minutes  Tallyn Holroyd [employer]

## 2019-03-09 ENCOUNTER — Inpatient Hospital Stay: Payer: Medicaid Other

## 2019-03-09 DIAGNOSIS — R29898 Other symptoms and signs involving the musculoskeletal system: Secondary | ICD-10-CM

## 2019-03-09 MED ORDER — GADOBUTROL 1 MMOL/ML IV SOLN
10.0000 mL | Freq: Once | INTRAVENOUS | Status: AC | PRN
  Administered 2019-03-09: 10 mL via INTRAVENOUS

## 2019-03-09 NOTE — Consult Note (Signed)
58 yo male who comes [facility] on 01/[facility]19 c AMS, confusion, and fever, admitted with sepsis and related encephalopathy.  Mentation has improved.  Pt is waiting in hos[facility]pital for guardianship and placement.  Likely dementia  Pt states he can't move his R leg and it does what it wants.  He states has been present since admission in March.    Objective: Current vital signs: BP (!) 142/84   Pulse 89   Temp 98.6 F (37 C) (Oral)   Resp 17   Ht 5\' 11"  (1.803 m)   Wt 87.6 kg   SpO2 97%   BMI 26.93 kg/m  Vital signs in last 24 hours: Temp:  [98.6 F (37 C)-98.9 F (37.2 C)] 98.6 F (37 C) (04/27 0526) Pulse Rate:  [87-92] 89 (04/27 0914) Resp:  [16-18] 17 (04/27 0526) BP: (135-142)/(78-85) 142/84 (04/27 0914) SpO2:  [96 %-97 %] 97 % (04/27 0526)  Intake/Output from previous day: 04/26 0701 - 04/27 0700 In: 720 [P.O.:720] Out: 700 [Urine:700] Intake/Output this shift: Total I/O In: 240 [P.O.:240] Out: 400 [Urine:400] Nutritional status:  Diet Order            Diet regular Room service appropriate? Yes; Fluid consistency: Thin  Diet effective now        Diet - low sodium heart healthy              Neurologic Exam:   Neurological Examination   Mental Status: Alert to name and location  Cranial Nerves: II: Discs flat bilaterally; Visual fields grossly normal, pupils equal, round, reactive to light and accommodation III,IV, VI: ptosis not present, extra-ocular motions intact bilaterally V,VII: smile symmetric, facial light touch sensation normal bilaterally VIII: hearing normal bilaterally IX,X: gag reflex present XI: bilateral shoulder shrug XII: midline tongue extension Motor: Right : Upper extremity   5/5    Left:     Upper extremity   5/5  Lower extremity   2/5--> but able to hold it briefly      Lower extremity   4/5 Tone and bulk:normal tone throughout; no atrophy noted Sensory: Pinprick and light touch intact throughout, bilaterally Deep Tendon Reflexes: 1+ and  symmetric throughout Plantars: Right: downgoing   Left: downgoing Cerebellar: normal finger-to-nose, normal rapid alternating movements and normal heel-to-shin test Gait: not tested.       Lab Results: Results for orders placed or performed during the hospital encounter of 01/20/19 (from the past 48 hour(s))  TSH     Status: None   Collection Time: 03/08/19  5:44 AM  Result Value Ref Range   TSH 3.320 0.350 - 4.500 uIU/mL    Comment: Performed by a 3rd Generation assay with a functional sensitivity of <=0.01 uIU/mL. Performed at [facility], [address] [place], [place] [address]   Vitamin B12     Status: None   Collection Time: 03/08/19  5:44 AM  Result Value Ref Range   Vitamin B-12 382 180 - 914 pg/mL    Comment: (NOTE) This assay is not validated for testing neonatal or myeloproliferative syndrome specimens for Vitamin B12 levels. Performed at [facility], [address] [place], [place] [address]     Recent Results (from the past 240 hour(s))  Novel Coronavirus, NAA (hospital order; send-out to ref lab)     Status: None   Collection Time: 03/06/19 11:07 AM  Result Value Ref Range Status   SARS-CoV-2, NAA NOT DETECTED NOT DETECTED Final    Comment: (NOTE) This  test was developed and its performance characteristics determined by [employer]. This test has not been FDA cleared or approved. This test has been authorized by FDA under an Emergency Use Authorization (EUA). This test has been validated in accordance with the FDA's Guidance Document (Policy for Diagnostics Testing in Laboratories Certified to Perform High Complexity Testing under CLIA prior to Emergency Use Authorization for Coronavirus Disease-2019 during the [facility] Emergency) issued on February 29th, 2020. FDA independent review of this validation is pending. This test is only authorized for the duration of time the declaration that circumstances exist justifying the  authorization of the emergency use of in vitro diagnostic tests for detection of SARS-CoV- 2 virus and/or diagnosis of COVID-19 infection under section 564(b)(1) of the Act, 21 U.S.C. 427CWC-3(J)(6), unless the authorization is terminated or revoked sooner. Performed At: [facility] [address] [place], [place] [id number] Jolene Schimke MD YW:[phone]    Coronavirus Source NASOPHARYNGEAL  Final    Comment: Performed at [facility], [address] [place], [place] [address]    Lipid Panel No results for input(s): CHOL, TRIG, HDL, CHOLHDL, VLDL, LDLCALC in the last 72 hours.  Studies/Results: Ct Head Wo Contrast  Result Date: 03/07/2019 CLINICAL DATA:  Continued RIGHT leg weakness since admission. Dementia. EXAM: CT HEAD WITHOUT CONTRAST TECHNIQUE: Contiguous axial images were obtained from the base of the skull through the vertex without intravenous contrast. COMPARISON:  CT head 01/20/2019. FINDINGS: Brain: No evidence for acute infarction, hemorrhage, mass lesion, hydrocephalus, or extra-axial fluid. Cerebral and cerebellar atrophy, premature for age. Extensive hypoattenuation of white matter, likely small vessel disease. Vascular: No hyperdense vessel or unexpected calcification. Skull: Calvarium intact. No skull base lesion. Sinuses/Orbits: No acute sinus disease. Negative visualized orbits. Other: No mastoid or middle ear fluid. Scalp soft tissues unremarkable. IMPRESSION: Premature for age atrophy. White matter hypoattenuation most consistent with advanced small vessel disease. No acute intracranial findings. Electronically Signed   By: Elsie Stain M.D.   On: 03/07/2019 13:12    Medications: I have reviewed the patient's current medications.  Assessment/Plan:  58 yo male who [facility] on 01/20/19 c AMS, confusion, and fever, admitted with sepsis and related encephalopathy.  Mentation has improved.  Pt is waiting in hospital for guardianship and  placement.  Likely dementia  Pt states he can't move his R leg and it does what it wants.  He states has been present since admission in March.    - I am not convinced this is stroke as states when he sleeps the leg moves or he occasionally can move it. - No bladder/fecal incontinence - Don't think this is a stroke - MRI near time of admission no acute abnormality and CTH on 4/25 with lots of atrophy - I thick this could be psychogenic but will obtain MRI L and T spine to make sure no acute transverse myelitis or inflammation.      LOS: 48 days   @LRSIGN @ 03/09/2019  10:55 AM
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
## 2019-03-09 NOTE — Progress Notes (Signed)
OT Cancellation Note  Patient Details Name: Maxwell Miller MRN: [id number] DOB: 1961-08-28   Cancelled Treatment:    Reason Eval/Treat Not Completed: Patient at procedure or test/ unavailable. Pt out of room. Will re-attempt at later date/time as pt is available.   Richrd Prime, MPH, MS, OTR/L ascom [phone] 03/09/19, 3:41 PM

## 2019-03-09 NOTE — Progress Notes (Signed)
PT Cancellation Note  Patient Details Name: Maxwell Miller MRN: [id number] DOB: [birth year]   Cancelled Treatment:    Reason Eval/Treat Not Completed: Patient declined, no reason specified(Pt reports he would like to continue working on his crossword puzzles. Declines working with PT at this time. Will continue to follow. )  4:03 PM, 03/09/19 [name], PT, DPT Physical Therapist - [facility]  [phone] (ASCOM)     [name] C 03/09/2019, 4:03 PM

## 2019-03-10 LAB — CBC
HCT: 45.7 % (ref 39.0–52.0)
Hemoglobin: 15.3 g/dL (ref 13.0–17.0)
MCH: 29.5 pg (ref 26.0–34.0)
MCHC: 33.5 g/dL (ref 30.0–36.0)
MCV: 88.1 fL (ref 80.0–100.0)
Platelets: 175 10*3/uL (ref 150–400)
RBC: 5.19 MIL/uL (ref 4.22–5.81)
RDW: 16.6 % — ABNORMAL HIGH (ref 11.5–15.5)
WBC: 7.8 10*3/uL (ref 4.0–10.5)
nRBC: 0 % (ref 0.0–0.2)

## 2019-03-10 LAB — CREATININE, SERUM
Creatinine, Ser: 1.02 mg/dL (ref 0.61–1.24)
GFR calc Af Amer: >60 mL/min (ref >60)
GFR calc non Af Amer: >60 mL/min (ref >60)

## 2019-03-10 LAB — RPR: RPR Ser Ql: NONREACTIVE

## 2019-03-10 NOTE — Progress Notes (Addendum)
Physical Therapy Treatment Patient Details Name: Maxwell Miller MRN: [id number] DOB: [birth year] Today's Date: 03/10/2019    History of Present Illness Maxwell GurneyHansy Kings is a 58 yo male who [facility] c AMS, confusion, and fever, admitted with sepsis and related encephalopathy. 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 Pt showed good improvement in language fluency working with SLP, but memory and attention deficits persisted despite OT services. PTA pt was living with his spouse, AMB freely without assistive device or difficulty, but needed assistance with ADL/IADL and close supervision due to lability and severe ST/LT memory impairment. [facility], pt hd also made good progress working with psychiatry.  PMHx: portal vein thrombosis, CVA, HTN, erythrocytosis.     PT Comments    Pt received up in bed, fervently working a Careers adviserword-search. He is reluctant to cease, but eventually agreeable to work with PT. Pt puts forth more effort moving to EOB than in previous sessions, but still requires maxA d/t weakness. Pt demonstrates delayed initiation, sometimes not moving at all, such as when cued to don socks 3 times, unclear if this is apraxia? Pt much heavier with transition to standing this date, and tolerance to sustained standing is quite poor, immediately appearing ill thereafter. Pt reports his head to feel strange. Pt assisted to chair and set-up at window with his books and puzzles.       Follow Up Recommendations  SNF     Equipment Recommendations  Wheelchair (measurements PT);Wheelchair cushion (measurements PT);3in1 (PT)    Recommendations for Other Services       Precautions / Restrictions Precautions Precautions: Fall Restrictions Weight Bearing Restrictions: No    Mobility  Bed Mobility Overal  bed mobility: Needs Assistance       Supine to sit: Max assist     General bed mobility comments: more inititation and effot, but still requires heavy assistance. BUE insufficicent for this task, lots of shaking and fatgiue to failure in arms. Pt able to press down through arms to scoot hips forward to EOB when cued, but this takes significant effort. (Pt able to sit at EOB for 10+ minutes without support. Good trunk control and balance. )  Transfers Overall transfer level: Needs assistance Equipment used: Rolling walker (2 wheeled)   Sit to Stand: Max assist(much heavier this date. able to stand with RW with CGA only x45 seconds, but tolerated poorly. ) Stand pivot transfers: Max assist(not stepping, not able to inititate steps. )          Ambulation/Gait                 Stairs             Wheelchair Mobility    Modified Rankin (Stroke Patients Only)       Balance   Sitting-balance support: Feet unsupported;No upper extremity supported Sitting balance-Leahy Scale: Good     Standing balance support: Bilateral upper extremity supported;During functional activity Standing balance-Leahy Scale: Zero                              Cognition Arousal/Alertness: Awake/alert Behavior During Therapy: WFL for tasks assessed/performed Overall Cognitive Status: Within Functional Limits for tasks assessed  Exercises      General Comments        Pertinent Vitals/Pain Pain Assessment: No/denies pain    Home Living                      Prior Function            PT Goals (current goals can now be found in the care plan section) Acute Rehab PT Goals Patient Stated Goal: Return to walking and being able to go places PT Goal Formulation: With patient Time For Goal Achievement: 03/13/19 Potential to Achieve Goals: Poor Progress towards PT goals: Progressing toward goals     Frequency    Min 2X/week      PT Plan Current plan remains appropriate    Co-evaluation              AM-PAC PT "6 Clicks" Mobility   Outcome Measure  Help needed turning from your back to your side while in a flat bed without using bedrails?: A Lot Help needed moving from lying on your back to sitting on the side of a flat bed without using bedrails?: A Lot Help needed moving to and from a bed to a chair (including a wheelchair)?: A Lot Help needed standing up from a chair using your arms (e.g., wheelchair or bedside chair)?: A Lot Help needed to walk in hospital room?: A Lot Help needed climbing 3-5 steps with a railing? : Total 6 Click Score: 11    End of Session Equipment Utilized During Treatment: Gait belt Activity Tolerance: Treatment limited secondary to medical complications (Comment)(appears mildly distressed after sustained standing; pt reports his head feels strangely after sustained standing 45sec) Patient left: with call bell/phone within reach;with chair alarm set;in chair Nurse Communication: Mobility status PT Visit Diagnosis: Unsteadiness on feet (R26.81);Other abnormalities of gait and mobility (R26.89);Adult, failure to thrive (R62.7)     Time: 0940-1008 PT Time Calculation (min) (ACUTE ONLY): 28 min  Charges:  $Therapeutic Activity: 23-37 mins                     11:32 AM, 03/10/19 [name], PT, DPT Physical Therapist - [facility]  [phone] (ASCOM)     Maudry Zeidan C 03/10/2019, 11:29 AM

## 2019-03-10 NOTE — Progress Notes (Signed)
Patient ID: Maxwell Miller, male   DOB: 12/31/1960, 58 y.o.   MRN: [id number]  [employer] PROGRESS NOTE  Maxwell Miller [id number]:[id number] DOB: September 15, 1961 DOA: 01/20/2019 PCP: Mable Paris, PA-C  HPI/Subjective: Patient sitting in the chair and looking out the window.  He was working on some word finding activities.  As a few books on the windowsill.  Patient states he still cannot move his right leg.  Otherwise offers no complaints.  Objective: Vitals:   03/10/19 0928 03/10/19 1454  BP: 136/88 135/90  Pulse: 83 84  Resp:  20  Temp:  97.8 F (36.6 C)  SpO2:  98%    Filed Weights   01/20/19 0826 01/29/19 0340 02/12/19 0437  Weight: 81.6 kg 86 kg 87.6 kg    ROS: Review of Systems  Unable to perform ROS: Dementia  Respiratory: Negative for cough and shortness of breath.   Cardiovascular: Negative for chest pain.  Gastrointestinal: Negative for abdominal pain, constipation, diarrhea, nausea and vomiting.  Genitourinary: Negative for dysuria.  Musculoskeletal: Negative for joint pain.  Neurological: Positive for focal weakness. Negative for dizziness and headaches.   Exam: Physical Exam  HENT:  Nose: No mucosal edema.  Mouth/Throat: No oropharyngeal exudate or posterior oropharyngeal edema.  Eyes: Pupils are equal, round, and reactive to light. Conjunctivae, EOM and lids are normal.  Neck: No JVD present. Carotid bruit is not present. No edema present. No thyroid mass and no thyromegaly present.  Cardiovascular: S1 normal and S2 normal. Exam reveals no gallop.  No murmur heard. Pulses:      Dorsalis pedis pulses are 2+ on the right side and 2+ on the left side.  Respiratory: No respiratory distress. He has no wheezes. He has no rhonchi. He has no rales.  GI: Soft. Bowel sounds are normal. There is no abdominal tenderness.  Musculoskeletal:     Right ankle: He exhibits no swelling.     Left ankle: He exhibits no swelling.  Lymphadenopathy:    He has no cervical  adenopathy.  Neurological: He is alert. No cranial nerve deficit.  Unable to straight leg raise with his right leg.  Patient states he cannot feel his right leg.  Cannot move his toes or his leg.  Skin: Skin is warm. No rash noted. Nails show no clubbing.  Psychiatric: He has a normal mood and affect.      Data Reviewed: Basic Metabolic Panel: Recent Labs  Lab 03/07/19 0510 03/10/19 0419  NA 137  --   K 3.7  --   CL 105  --   CO2 25  --   GLUCOSE 139*  --   BUN 11  --   CREATININE 0.88 1.02  CALCIUM 9.0  --    CBC: Recent Labs  Lab 03/05/19 0913 03/07/19 0510 03/10/19 0419  WBC 7.8 7.3 7.8  HGB 15.7 15.3 15.3  HCT 46.7 46.7 45.7  MCV 88.4 89.3 88.1  PLT 170 167 175     Recent Results (from the past 240 hour(s))  Novel Coronavirus, NAA (hospital order; send-out to ref lab)     Status: None   Collection Time: 03/06/19 11:07 AM  Result Value Ref Range Status   SARS-CoV-2, NAA NOT DETECTED NOT DETECTED Final    Comment: (NOTE) This test was developed and its performance characteristics determined by [employer]. This test has not been FDA cleared or approved. This test has been authorized by FDA under an Emergency Use Authorization (EUA). This test has been validated in accordance  with the FDA's Guidance Document (Policy for Diagnostics Testing in Laboratories Certified to Perform High Complexity Testing under CLIA prior to Emergency Use Authorization for Coronavirus Disease-2019 during the [facility] Emergency) issued on February 29th, 2020. FDA independent review of this validation is pending. This test is only authorized for the duration of time the declaration that circumstances exist justifying the authorization of the emergency use of in vitro diagnostic tests for detection of SARS-CoV- 2 virus and/or diagnosis of COVID-19 infection under section 564(b)(1) of the Act, 21 U.S.C. 161WRU-0(A)(5360bbb-3(b)(1), unless the authorization is terminated or revoked  sooner. Performed At: [facility]  [facility] [address] [place], [place] [id number] Jolene SchimkeNagendra Sanjai MD NW:[id number]:[phone]    Coronavirus Source NASOPHARYNGEAL  Final    Comment: Performed at [facility], [address] [place], [place] [address]      Scheduled Meds: . amLODipine  10 mg Oral Daily  . apixaban  5 mg Oral BID  . atorvastatin  20 mg Oral q1800  . feeding supplement (ENSURE ENLIVE)  237 mL Oral BID BM  . folic acid  1 mg Oral Daily  . metoprolol tartrate  25 mg Oral BID  . mirtazapine  15 mg Oral QHS  . multivitamin with minerals  1 tablet Oral Daily  . senna-docusate  2 tablet Oral BID  . tamsulosin  0.4 mg Oral Daily  . thiamine  100 mg Oral Daily  . venlafaxine XR  150 mg Oral Q breakfast    Assessment/Plan:  1. Right lower extremity paralysis.  Repeat CT scan of the head negative for acute events.  Patient is on Eliquis already for portal vein thrombosis.  Looking into placement.  Appreciate neurology consultation.  MRI of lumbar and thoracic spine negative.  Continue to monitor. 2. Sepsis.  [name] in the blood on 01/20/2019.  Completed a course of antibiotics. 3. Acute encephalopathy.  Possibility of rapidly progressive dementia.  Nonreactive RPR.  B12 in normal range.  TSH normal range.  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 4. Depression on Remeron 5. Portal vein thrombosis on Eliquis 6. Chronic hypertension on Norvasc 7. Hyperlipidemia unspecified on atorvastatin 8. COVID-19 test negative.  It was ordered because we are thinking about rehab.  Not because the patient had symptoms.  Code Status:     Code Status Orders  (From admission, onward)         Start     Ordered   01/20/19 1235  Full code  Continuous     01/20/19 1235        Code Status History    This patient has a current code status but no historical code status.    Advance Directive Documentation     Most Recent Value  Type of Advance  Directive  Healthcare Power of Attorney  Pre-existing out of facility DNR order (yellow form or pink MOST form)  -  "MOST" Form in Place?  -     Disposition Plan: Trying to obtain legal guardian.  Time spent: 25 minutes  Mannie Ohlin [employer]  [employer]

## 2019-03-10 NOTE — TOC Progression Note (Signed)
Transition of Care [facility]) - Progression Note    Patient Details  Name: Maxwell Miller MRN: [id number] Date of Birth: Jun 14, 1961  Transition of Care [facility]) CM/SW Contact  Elliot Gault, LCSW Phone Number: 03/10/2019, 3:08 PM  Clinical Narrative:     TOC following. Guardianship process still pending. Pt cannot transition to another level of care until this is complete. Will follow.  Expected Discharge Plan: Home w Home Health Services Barriers to Discharge: Family Issues, Transportation  Expected Discharge Plan and Services Expected Discharge Plan: Home w Home Health Services   Discharge Planning Services: CM Consult Post Acute Care Choice: Home Health, Durable Medical Equipment   Expected Discharge Date: 01/24/19                         HH Arranged: RN, PT, Nurse's Aide HH Agency: [facility]         Social Determinants of Health (SDOH) Interventions    Readmission Risk Interventions No flowsheet data found.

## 2019-03-10 NOTE — Progress Notes (Addendum)
Subjective: Pt states he has R leg weakness but was witness to have slight movement and I think partially effort dependent.   Objective: Current vital signs: BP 136/88   Pulse 83   Temp 97.9 F (36.6 C) (Axillary)   Resp 20   Ht 5\' 11"  (1.803 m)   Wt 87.6 kg   SpO2 98%   BMI 26.93 kg/m  Vital signs in last 24 hours: Temp:  [97.9 F (36.6 C)-98.6 F (37 C)] 97.9 F (36.6 C) (04/28 0435) Pulse Rate:  [77-94] 83 (04/28 0928) Resp:  [20] 20 (04/27 1503) BP: (134-147)/(83-95) 136/88 (04/28 0928) SpO2:  [98 %-99 %] 98 % (04/28 0435)  Intake/Output from previous day: 04/27 0701 - 04/28 0700 In: 1220 [P.O.:1220] Out: 400 [Urine:400] Intake/Output this shift: Total I/O In: 120 [P.O.:120] Out: 400 [Urine:400] Nutritional status:  Diet Order            Diet regular Room service appropriate? Yes; Fluid consistency: Thin  Diet effective now        Diet - low sodium heart healthy              Neurologic Exam:   Neurological Examination   Mental Status: Alert to name and location  Cranial Nerves: II: Discs flat bilaterally; Visual fields grossly normal, pupils equal, round, reactive to light and accommodation III,IV, VI: ptosis not present, extra-ocular motions intact bilaterally V,VII: smile symmetric, facial light touch sensation normal bilaterally VIII: hearing normal bilaterally IX,X: gag reflex present XI: bilateral shoulder shrug XII: midline tongue extension Motor: Right : Upper extremity   5/5    Left:     Upper extremity   5/5  Lower extremity   2/5--> but able to hold it briefly      Lower extremity   4/5 Tone and bulk:normal tone throughout; no atrophy noted Sensory: Pinprick and light touch intact throughout, bilaterally Deep Tendon Reflexes: 1+ and symmetric throughout Plantars: Right: downgoing   Left: downgoing Cerebellar: normal finger-to-nose, normal rapid alternating movements and normal heel-to-shin test Gait: not tested.       Lab  Results: Results for orders placed or performed during the hospital encounter of 01/20/19 (from the past 48 hour(s))  Creatinine, serum     Status: None   Collection Time: 03/10/19  4:19 AM  Result Value Ref Range   Creatinine, Ser 1.02 0.61 - 1.24 mg/dL   GFR calc non Af Amer >60 >60 mL/min   GFR calc Af Amer >60 >60 mL/min    Comment: Performed at [facility], [address] [place], [place] [address]  CBC     Status: Abnormal   Collection Time: 03/10/19  4:19 AM  Result Value Ref Range   WBC 7.8 4.0 - 10.5 K/uL   RBC 5.19 4.22 - 5.81 MIL/uL   Hemoglobin 15.3 13.0 - 17.0 g/dL   HCT 43.5 68.6 - 16.8 %   MCV 88.1 80.0 - 100.0 fL   MCH 29.5 26.0 - 34.0 pg   MCHC 33.5 30.0 - 36.0 g/dL   RDW 37.2 (H) 90.2 - 11.1 %   Platelets 175 150 - 400 K/uL   nRBC 0.0 0.0 - 0.2 %    Comment: Performed at [facility], [address] [place], [place] [address]    Recent Results (from the past 240 hour(s))  Novel Coronavirus, NAA (hospital order; send-out to ref lab)     Status: None   Collection Time: 03/06/19 11:07 AM  Result Value Ref Range  Status   SARS-CoV-2, NAA NOT DETECTED NOT DETECTED Final    Comment: (NOTE) This test was developed and its performance characteristics determined by [facility]. This test has not been FDA cleared or approved. This test has been authorized by FDA under an Emergency Use Authorization (EUA). This test has been validated in accordance with the FDA's Guidance Document (Policy for Diagnostics Testing in Laboratories Certified to Perform High Complexity Testing under CLIA prior to Emergency Use Authorization for Coronavirus Disease-2019 during the [facility]) issued on February 29th, 2020. FDA independent review of this validation is pending. This test is only authorized for the duration of time the declaration that circumstances exist justifying the authorization of the emergency use of in vitro diagnostic tests  for detection of SARS-CoV- 2 virus and/or diagnosis of COVID-19 infection under section 564(b)(1) of the Act, 21 U.S.C. 161WRU-0(A)(5360bbb-3(b)(1), unless the authorization is terminated or revoked sooner. Performed At: [facility] [address] [place], [place] [id number] Jolene SchimkeNagendra Sanjai MD NW:[phone]    Coronavirus Source NASOPHARYNGEAL  Final    Comment: Performed at [facility], [address] [place], [place] [address]    Lipid Panel No results for input(s): CHOL, TRIG, HDL, CHOLHDL, VLDL, LDLCALC in the last 72 hours.  Studies/Results: Mr Thoracic Spine W Wo Contrast  Result Date: 03/09/2019 CLINICAL DATA:  Confusion and fever. Encephalopathy. Dementia. Unable to move the right leg. EXAM: MRI THORACIC WITHOUT AND WITH CONTRAST TECHNIQUE: Multiplanar and multiecho pulse sequences of the thoracic spine were obtained without and with intravenous contrast. CONTRAST:  10 cc Gadavist COMPARISON:  None. FINDINGS: MRI THORACIC SPINE FINDINGS Alignment:  Normal Vertebrae: No fracture or primary lesion. No evidence of spinal infection. Cord: No cord compression or primary cord lesion. No abnormal enhancement. Paraspinal and other soft tissues: Normal Disc levels: No degenerative disc disease. No disc herniation. No canal or foraminal stenosis. Very minimal facet and ligamentous prominence at T4-5 without compressive effect. Mild ligamentous prominence at T7-8 without compressive effect. IMPRESSION: No significant finding in the thoracic region. No evidence of stenosis or neural compression. No cord lesion. No evidence of spinal infection. Electronically Signed   By: Paulina FusiMark  Shogry M.D.   On: 03/09/2019 14:53   Mr Lumbar Spine W Wo Contrast  Result Date: 03/09/2019 CLINICAL DATA:  Admitted with altered mental status, confusion and fever. Unable to move the right leg. EXAM: MRI LUMBAR SPINE WITHOUT AND WITH CONTRAST TECHNIQUE: Multiplanar and multiecho pulse sequences of the lumbar spine  were obtained without and with intravenous contrast. CONTRAST:  10 cc Gadavist COMPARISON:  CT abdomen 01/21/2019 FINDINGS: Segmentation:  5 lumbar type vertebral bodies. Alignment:  Normal Vertebrae: No fracture or significant bone lesion. Insignificant benign hemangioma within the right side of the L3 vertebral body. Conus medullaris and cauda equina: Conus extends to the L1 level. Conus and cauda equina appear normal. Paraspinal and other soft tissues: Negative Disc levels: No abnormality at L3-4 or above. L4-5: Minimal disc bulge.  No stenosis. L5-S1: Minimal disc bulge.  No stenosis. IMPRESSION: No significant finding the lumbar region. No sign of spinal infection or neural compression. Minimal non-compressive disc bulges at L4-5 and L5-S1. Electronically Signed   By: Paulina FusiMark  Shogry M.D.   On: 03/09/2019 14:56    Medications: I have reviewed the patient's current medications.  Assessment/Plan:  58 yo male [facility] c AMS, confusion, and fever, admitted with sepsis and related encephalopathy.  Mentation has improved.  Pt is waiting  in hospital for guardianship and placement.  Likely dementia  Pt states he can't move his R leg and it does what it wants.  He states has been present since admission in March.    Pt states he has R leg weakness but was witness to have slight movement and I think partially effort dependent.   - No acute abnormality on MRI T/L spine to warrant this weakness and again I believe effort dependent.   - No further imaging for now.      LOS: 49 days    03/10/2019  11:13 AM

## 2019-03-11 NOTE — Progress Notes (Signed)
PT Cancellation Note  Patient Details Name: Maxwell Miller MRN: [id number] DOB: 04/16/1961   Cancelled Treatment:    Reason Eval/Treat Not Completed: Patient declined, no reason specified(Chart reviewed, treatment attempted, pt appears somnolent but awake. Pt refuses PT at this time, reports he does not feel up to it. )  4:05 PM, 03/11/19 [name], PT, DPT Physical Therapist - [facility]  [phone] (ASCOM)    [name] 03/11/2019, 4:05 PM

## 2019-03-11 NOTE — Progress Notes (Signed)
Patient ID: Maxwell Miller, male   DOB: 06/05/1961, 58 y.o.   MRN: [id number]  [employer] PROGRESS NOTE  Atharv Assis BSJ:[id number] DOB: 1961-05-25 DOA: 01/20/2019 PCP: Mable Paris, PA-C  HPI/Subjective: Patient cannot move his right leg.  He does not seem overly concerned about it.  He is concerned that he is locked up here in the hospital.  Objective: Vitals:   03/10/19 1944 03/11/19 0440  BP: 140/90 (!) 132/94  Pulse: 94 72  Resp: 15 14  Temp: 98.4 F (36.9 C) 98.2 F (36.8 C)  SpO2: 98% 99%    Filed Weights   01/20/19 0826 01/29/19 0340 02/12/19 0437  Weight: 81.6 kg 86 kg 87.6 kg    ROS: Review of Systems  Unable to perform ROS: Dementia  Respiratory: Negative for cough and shortness of breath.   Cardiovascular: Negative for chest pain.  Gastrointestinal: Negative for abdominal pain, constipation, diarrhea, nausea and vomiting.  Genitourinary: Negative for dysuria.  Musculoskeletal: Negative for joint pain.  Neurological: Positive for focal weakness. Negative for dizziness and headaches.   Exam: Physical Exam  HENT:  Nose: No mucosal edema.  Mouth/Throat: No oropharyngeal exudate or posterior oropharyngeal edema.  Eyes: Pupils are equal, round, and reactive to light. Conjunctivae, EOM and lids are normal.  Neck: No JVD present. Carotid bruit is not present. No edema present. No thyroid mass and no thyromegaly present.  Cardiovascular: S1 normal and S2 normal. Exam reveals no gallop.  No murmur heard. Pulses:      Dorsalis pedis pulses are 2+ on the right side and 2+ on the left side.  Respiratory: No respiratory distress. He has no wheezes. He has no rhonchi. He has no rales.  GI: Soft. Bowel sounds are normal. There is no abdominal tenderness.  Musculoskeletal:     Right ankle: He exhibits no swelling.     Left ankle: He exhibits no swelling.  Lymphadenopathy:    He has no cervical adenopathy.  Neurological: He is alert. No cranial nerve  deficit.  Unable to straight leg raise with his right leg.  Patient states he cannot feel his right leg.  Cannot move his toes or his leg.  Skin: Skin is warm. No rash noted. Nails show no clubbing.  Psychiatric: He has a normal mood and affect.      Data Reviewed: Basic Metabolic Panel: Recent Labs  Lab 03/07/19 0510 03/10/19 0419  NA 137  --   K 3.7  --   CL 105  --   CO2 25  --   GLUCOSE 139*  --   BUN 11  --   CREATININE 0.88 1.02  CALCIUM 9.0  --    CBC: Recent Labs  Lab 03/05/19 0913 03/07/19 0510 03/10/19 0419  WBC 7.8 7.3 7.8  HGB 15.7 15.3 15.3  HCT 46.7 46.7 45.7  MCV 88.4 89.3 88.1  PLT 170 167 175     Recent Results (from the past 240 hour(s))  Novel Coronavirus, NAA (hospital order; send-out to ref lab)     Status: None   Collection Time: 03/06/19 11:07 AM  Result Value Ref Range Status   SARS-CoV-2, NAA NOT DETECTED NOT DETECTED Final    Comment: (NOTE) This test was developed and its performance characteristics determined by [facility]. This test has not been FDA cleared or approved. This test has been authorized by FDA under an Emergency Use Authorization (EUA). This test has been validated in accordance with the FDA's Guidance Document Psychologist, prison and probation services for Diagnostics Testing in  Laboratories Certified to Perform High Complexity Testing under CLIA prior to Emergency Use Authorization for Coronavirus Disease-2019 during the [facility]) issued on February 29th, 2020. FDA independent review of this validation is pending. This test is only authorized for the duration of time the declaration that circumstances exist justifying the authorization of the emergency use of in vitro diagnostic tests for detection of SARS-CoV- 2 virus and/or diagnosis of COVID-19 infection under section 564(b)(1) of the Act, 21 U.S.C. 409WJX-9(J)(4360bbb-3(b)(1), unless the authorization is terminated or revoked sooner. Performed At: [facility] [place] [address] [place], [place] [id number] Jolene SchimkeNagendra Sanjai MD YQ:[id number]:[phone]    Coronavirus Source NASOPHARYNGEAL  Final    Comment: Performed at [facility], [address] [place], [place] [address]      Scheduled Meds: . amLODipine  10 mg Oral Daily  . apixaban  5 mg Oral BID  . atorvastatin  20 mg Oral q1800  . feeding supplement (ENSURE ENLIVE)  237 mL Oral BID BM  . folic acid  1 mg Oral Daily  . metoprolol tartrate  25 mg Oral BID  . mirtazapine  15 mg Oral QHS  . multivitamin with minerals  1 tablet Oral Daily  . senna-docusate  2 tablet Oral BID  . tamsulosin  0.4 mg Oral Daily  . thiamine  100 mg Oral Daily  . venlafaxine XR  150 mg Oral Q breakfast    Assessment/Plan:  1. Right lower extremity paralysis.  As per neurology this is likely intermittent.  I am never able to elicit any movement of his right leg.  Repeat CT scan of the head negative for acute events.  Patient is on Eliquis already for portal vein thrombosis.  Looking into placement.  Appreciate neurology consultation.  MRI of lumbar and thoracic spine negative.  Continue to monitor. 2. Sepsis.  [name] in the blood on 01/20/2019.  Completed a course of antibiotics. 3. Acute encephalopathy.  Possibility of rapidly progressive dementia.  Nonreactive RPR.  B12 in normal range.  TSH normal range.  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 4. Depression on Remeron 5. Portal vein thrombosis on Eliquis 6. Chronic hypertension on Norvasc 7. Hyperlipidemia unspecified on atorvastatin 8. COVID-19 test negative.  It was ordered because we are thinking about rehab.  Not because the patient had symptoms.  Code Status:     Code Status Orders  (From admission, onward)         Start     Ordered   01/20/19 1235  Full code  Continuous     01/20/19 1235        Code Status History    This patient has a current code status but no historical code status.    Advance Directive  Documentation     Most Recent Value  Type of Advance Directive  Healthcare Power of Attorney  Pre-existing out of facility DNR order (yellow form or pink MOST form)  -  "MOST" Form in Place?  -     Disposition Plan: Trying to obtain legal guardian.  Time spent: 24 minutes.  Case discussed with neurology  Alford Highlandichard Norwin Aleman  [employer]

## 2019-03-11 NOTE — Progress Notes (Signed)
Nutrition Follow-up  RD working remotely.  DOCUMENTATION CODES:   Non-severe (moderate) malnutrition in context of social or environmental circumstances  INTERVENTION:  Continue Ensure Enlive po BID, each supplement provides 350 kcal and 20 grams of protein.  NUTRITION DIAGNOSIS:   Moderate Malnutrition related to social / environmental circumstances as evidenced by mild fat depletion, moderate fat depletion, mild muscle depletion, moderate muscle depletion.  Ongoing - addressing with interventions.  GOAL:   Patient will meet greater than or equal to 90% of their needs  Progressing.  MONITOR:   PO intake, Supplement acceptance, Weight trends, I & O's  REASON FOR ASSESSMENT:   LOS    ASSESSMENT:   58 year old male who presented to the ED on 3/10 with AMS. PMH of stroke, HTN, memory loss. Pt admitted with sepsis.  Spoke with patient over the phone. He reports his appetite continues to be "fairly good" here. Patient continues to eat about 90-100% of meals per chart and is drinking his Ensure regularly. Patient having regular bowel movements. Per chart patient still pending guardianship and placement.  Medications reviewed and include: Eliquis, Lipitor, folic acid 1 mg daily, Remeron 15 mg QHS, MVI daily, senna-docusate 2 tablets BID, Flomax, thiamine 100 mg daily.  Labs reviewed.  Diet Order:   Diet Order            Diet regular Room service appropriate? Yes; Fluid consistency: Thin  Diet effective now        Diet - low sodium heart healthy             EDUCATION NEEDS:   Education needs have been addressed  Skin:  Skin Assessment: Reviewed RN Assessment  Last BM:  03/10/2019 - large type 4  Height:   Ht Readings from Last 1 Encounters:  01/20/19 5\' 11"  (1.803 m)   Weight:   Wt Readings from Last 1 Encounters:  02/12/19 87.6 kg   Ideal Body Weight:  78.2 kg  BMI:  Body mass index is 26.93 kg/m.  Estimated Nutritional Needs:   Kcal:   2150-2350  Protein:  110-125 grams  Fluid:  >/= 2.2 L  Helane Rima, MS, RD, LDN Office: [phone] Pager: [phone] After Hours/Weekend Pager: [phone]

## 2019-03-12 NOTE — Progress Notes (Signed)
Patient ID: Maxwell Miller, male   DOB: 01/17/1961, 58 y.o.   MRN: [id number]  [employer] PROGRESS NOTE  Maxwell Miller OFB:[id number] DOB: 08-25-1961 DOA: 01/20/2019 PCP: Mable Paris, PA-C  HPI/Subjective: Patient still cannot move his right leg.  Did have a little withdrawal for his right leg when I did Babinski.  Objective: Vitals:   03/12/19 0436 03/12/19 0845  BP: 129/90 136/87  Pulse: 67 77  Resp: 16   Temp: 98.1 F (36.7 C)   SpO2: 98%     Filed Weights   01/20/19 0826 01/29/19 0340 02/12/19 0437  Weight: 81.6 kg 86 kg 87.6 kg    ROS: Review of Systems  Unable to perform ROS: Dementia  Respiratory: Negative for cough and shortness of breath.   Cardiovascular: Negative for chest pain.  Gastrointestinal: Negative for abdominal pain, constipation, diarrhea, nausea and vomiting.  Genitourinary: Negative for dysuria.  Musculoskeletal: Negative for joint pain.  Neurological: Positive for focal weakness. Negative for dizziness and headaches.   Exam: Physical Exam  HENT:  Nose: No mucosal edema.  Mouth/Throat: No oropharyngeal exudate or posterior oropharyngeal edema.  Eyes: Pupils are equal, round, and reactive to light. Conjunctivae, EOM and lids are normal.  Neck: No JVD present. Carotid bruit is not present. No edema present. No thyroid mass and no thyromegaly present.  Cardiovascular: S1 normal and S2 normal. Exam reveals no gallop.  No murmur heard. Pulses:      Dorsalis pedis pulses are 2+ on the right side and 2+ on the left side.  Respiratory: No respiratory distress. He has no wheezes. He has no rhonchi. He has no rales.  GI: Soft. Bowel sounds are normal. There is no abdominal tenderness.  Musculoskeletal:     Right ankle: He exhibits no swelling.     Left ankle: He exhibits no swelling.  Lymphadenopathy:    He has no cervical adenopathy.  Neurological: He is alert. No cranial nerve deficit.  Slight withdrawal with right leg when I did  Babinski today.  Still not moving right leg or toes to command.  Skin: Skin is warm. No rash noted. Nails show no clubbing.  Psychiatric: He has a normal mood and affect.      Data Reviewed: Basic Metabolic Panel: Recent Labs  Lab 03/07/19 0510 03/10/19 0419  NA 137  --   K 3.7  --   CL 105  --   CO2 25  --   GLUCOSE 139*  --   BUN 11  --   CREATININE 0.88 1.02  CALCIUM 9.0  --    CBC: Recent Labs  Lab 03/07/19 0510 03/10/19 0419  WBC 7.3 7.8  HGB 15.3 15.3  HCT 46.7 45.7  MCV 89.3 88.1  PLT 167 175     Recent Results (from the past 240 hour(s))  Novel Coronavirus, NAA (hospital order; send-out to ref lab)     Status: None   Collection Time: 03/06/19 11:07 AM  Result Value Ref Range Status   SARS-CoV-2, NAA NOT DETECTED NOT DETECTED Final    Comment: (NOTE) This test was developed and its performance characteristics determined by [facility] [employer] [facility]. This test has not been FDA cleared or approved. This test has been authorized by FDA under an Emergency Use Authorization (EUA). This test has been validated in accordance with the FDA's Guidance Document Psychologist, prison and probation services for Diagnostics Testing in Laboratories Certified to Perform High Complexity Testing under CLIA prior to Emergency Use Authorization for Coronavirus Disease-2019 during the [facility]) issued on  February 29th, 2020. FDA independent review of this validation is pending. This test is only authorized for the duration of time the declaration that circumstances exist justifying the authorization of the emergency use of in vitro diagnostic tests for detection of SARS-CoV- 2 virus and/or diagnosis of COVID-19 infection under section 564(b)(1) of the Act, 21 U.S.C. 147WGN-5(A)(2360bbb-3(b)(1), unless the authorization is terminated or revoked sooner. Performed At: [facility] [address] [place], [place] [id number] Jolene SchimkeNagendra Sanjai MD ON:[id number]:[phone]    Coronavirus Source NASOPHARYNGEAL  Final     Comment: Performed at [facility], [address] [place], [place] [address]      Scheduled Meds: . amLODipine  10 mg Oral Daily  . apixaban  5 mg Oral BID  . atorvastatin  20 mg Oral q1800  . feeding supplement (ENSURE ENLIVE)  237 mL Oral BID BM  . folic acid  1 mg Oral Daily  . metoprolol tartrate  25 mg Oral BID  . mirtazapine  15 mg Oral QHS  . multivitamin with minerals  1 tablet Oral Daily  . senna-docusate  2 tablet Oral BID  . tamsulosin  0.4 mg Oral Daily  . thiamine  100 mg Oral Daily  . venlafaxine XR  150 mg Oral Q breakfast    Assessment/Plan:  1. Right lower extremity paralysis.  As per neurology this is likely intermittent.  Able to get slight withdrawal from the right leg with Babinski today.  Repeat CT scan of the head negative for acute events.  Patient is on Eliquis already for portal vein thrombosis.  Looking into placement.  MRI of lumbar and thoracic spine negative.  Continue to monitor. 2. Sepsis.  [name] in the blood on 01/20/2019.  Completed a course of antibiotics. 3. Acute encephalopathy.  Possibility of rapidly progressive dementia.  Nonreactive RPR.  B12 in normal range.  TSH normal range.  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  Patient answers some yes or no questions. 4. Depression on Remeron 5. Portal vein thrombosis on Eliquis 6. Chronic hypertension on Norvasc 7. Hyperlipidemia unspecified on atorvastatin 8. COVID-19 test negative.  It was ordered because we are thinking about rehab.  Not because the patient had symptoms.  Code Status:     Code Status Orders  (From admission, onward)         Start     Ordered   01/20/19 1235  Full code  Continuous     01/20/19 1235        Code Status History    This patient has a current code status but no historical code status.    Advance Directive Documentation     Most Recent Value  Type of Advance Directive  Healthcare Power of Attorney   Pre-existing out of facility DNR order (yellow form or pink MOST form)  -  "MOST" Form in Place?  -     Disposition Plan: Trying to obtain legal guardian.  Time spent: 25 minutes.  Graceland W[employer]ter [employer]  [employer]

## 2019-03-12 NOTE — Progress Notes (Signed)
Physical Therapy Treatment Patient Details Name: Maxwell GurneyHansy Monteforte MRN: [id number] DOB: [birth year] Today's Date: 03/12/2019    History of Present Illness Maxwell Miller is a 58 yo male who [facility] c AMS, confusion, and fever, admitted with sepsis and related encephalopathy. 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 Pt showed good improvement in language fluency working with SLP, but memory and attention deficits persisted despite OT services. PTA pt was living with his spouse, AMB freely without assistive device or difficulty, but needed assistance with ADL/IADL and close supervision due to lability and severe ST/LT memory impairment. [facility], pt hd also made good progress working with psychiatry.  PMHx: portal vein thrombosis, CVA, HTN, erythrocytosis.     PT Comments    Patient maxA for STS and [facility] +[facility] is able to complete AROM hip flex/ knee ext, and flex on L but not on RLE. PT discussed pain with patient, which he reports none. During subsequent STS attempt for cleaning patient is able to stand modA with good effort through LLE. patient is able to transfer back to bed modA as well. PT encouraged patient to sit up in chair with puzzles, but patient declined. Would benefit from skilled PT to address above deficits and promote optimal return to PLOF    Follow Up Recommendations  SNF     Equipment Recommendations  Wheelchair (measurements PT);Wheelchair cushion (measurements PT);3in1 (PT)    Recommendations for Other Services       Precautions / Restrictions      Mobility  Bed Mobility Overal bed mobility: Needs Assistance Bed Mobility: Supine to Sit;Sit to Supine     Supine to sit: Mod assist;+2 for safety/equipment Sit to supine: +2 for  safety/equipment;Mod assist   General bed mobility comments: Utilized +2 to get back in bed which patient does not resist  Transfers Overall transfer level: Needs assistance Equipment used: Rolling walker (2 wheeled)   Sit to Stand: Mod assist;Max assist Stand pivot transfers: Mod assist;Max assist;+2 physical assistance;+2 safety/equipment       General transfer comment: Patient requires max assist for initial STS attempt; modA with WB through LLE for second attempt. Stand pivot transfer maxA +2 initially and modA for subsequent attempt with good effort throught LLE.  Ambulation/Gait                 Stairs             Wheelchair Mobility    Modified Rankin (Stroke Patients Only)       Balance                                            Cognition Arousal/Alertness: Awake/alert Behavior During Therapy: WFL for tasks assessed/performed Overall Cognitive Status: Within Functional Limits for tasks assessed Area of Impairment: Memory;Following commands;Problem solving;Attention;Safety/judgement                   Current Attention Level: Selective Memory: Decreased recall of precautions;Decreased short-term memory Following Commands: Follows one step commands inconsistently Safety/Judgement: Decreased awareness of safety   Problem Solving: Slow processing;Requires tactile cues;Decreased initiation;Difficulty sequencing;Requires verbal cues General Comments: Baseline heavy ST/LT memory impairment. Pt also has a history  of aphasia, now still seems to struggle with language in describing his symptoms and complaints.       Exercises Other Exercises Other Exercises: Attempted STS at start of treatment patient maxA and unable to produce any effort. Following completed stand pivot to [facility] which patient is maxA +2. Following uses BSC patient is able to stand with good effort through LLE and remain standing modA with PT while nursing cleaned  patient, cuing for upright posture. PT lowered patietn to [facility], rested and attempted stand pivot back to bed which patient is able to produce good effort for as well.     General Comments        Pertinent Vitals/Pain Pain Location: Reports some R shoulder pain with overhead motion    Home Living                      Prior Function            PT Goals (current goals can now be found in the care plan section) Acute Rehab PT Goals Patient Stated Goal: Return to walking and being able to go places PT Goal Formulation: With patient Time For Goal Achievement: 03/13/19 Potential to Achieve Goals: Poor Progress towards PT goals: Progressing toward goals    Frequency    Min 2X/week      PT Plan Current plan remains appropriate    Co-evaluation              AM-PAC PT "6 Clicks" Mobility   Outcome Measure  Help needed turning from your back to your side while in a flat bed without using bedrails?: A Lot Help needed moving from lying on your back to sitting on the side of a flat bed without using bedrails?: A Lot Help needed moving to and from a bed to a chair (including a wheelchair)?: A Lot Help needed standing up from a chair using your arms (e.g., wheelchair or bedside chair)?: A Lot Help needed to walk in hospital room?: A Lot Help needed climbing 3-5 steps with a railing? : Total 6 Click Score: 11    End of Session Equipment Utilized During Treatment: Gait belt Activity Tolerance: Treatment limited secondary to medical complications (Comment) Patient left: with call bell/phone within reach;with chair alarm set;in chair Nurse Communication: Mobility status PT Visit Diagnosis: Unsteadiness on feet (R26.81);Other abnormalities of gait and mobility (R26.89);Adult, failure to thrive (R62.7)     Time: 1410-1435 PT Time Calculation (min) (ACUTE ONLY): 25 min  Charges:  $Therapeutic Activity: 23-37 mins                     Staci Acosta PT,  DPT    Staci Acosta 03/12/2019, 3:25 PM

## 2019-03-12 NOTE — Progress Notes (Signed)
OT Cancellation Note  Patient Details Name: Arav Scroggin MRN: [id number] DOB: [birth year]   Cancelled Treatment:    Reason Eval/Treat Not Completed: Patient declined, no reason specified. Pt. Declined/refused initial attempt for OT treatment session. Will reattempt treatment at a later time/date.  Olegario Messier, MS, OTR/L 03/12/2019, 10:53 AM

## 2019-03-13 LAB — CBC
HCT: 46 % (ref 39.0–52.0)
Hemoglobin: 15.4 g/dL (ref 13.0–17.0)
MCH: 29.4 pg (ref 26.0–34.0)
MCHC: 33.5 g/dL (ref 30.0–36.0)
MCV: 88 fL (ref 80.0–100.0)
Platelets: 195 10*3/uL (ref 150–400)
RBC: 5.23 MIL/uL (ref 4.22–5.81)
RDW: 17 % — ABNORMAL HIGH (ref 11.5–15.5)
WBC: 7 10*3/uL (ref 4.0–10.5)
nRBC: 0 % (ref 0.0–0.2)

## 2019-03-13 LAB — CREATININE, SERUM
Creatinine, Ser: 1.11 mg/dL (ref 0.61–1.24)
GFR calc Af Amer: >60 mL/min (ref >60)
GFR calc non Af Amer: >60 mL/min (ref >60)

## 2019-03-13 NOTE — Progress Notes (Signed)
Patient ID: Nickoles Pacyna, male   DOB: [birth year], 58 y.o.   MRN: [id number]  [employer] PROGRESS NOTE  Jader Kos RNH:[id number] DOB: 1961-06-15 DOA: 01/20/2019 PCP: Mable Paris, PA-C  HPI/Subjective: Patient does not feel well but cannot describe how he is feeling.  When I distracted him I felt that he did move his right toes.  He also was able to move some of the muscles in his right upper leg.  Objective: Vitals:   03/13/19 0415 03/13/19 0830  BP: 133/89 (!) 138/97  Pulse: 77 78  Resp: 17 17  Temp: 98.3 F (36.8 C) 97.6 F (36.4 C)  SpO2: 97% 98%    Filed Weights   01/20/19 0826 01/29/19 0340 02/12/19 0437  Weight: 81.6 kg 86 kg 87.6 kg    ROS: Review of Systems  Unable to perform ROS: Dementia  Respiratory: Negative for cough and shortness of breath.   Cardiovascular: Negative for chest pain.  Gastrointestinal: Negative for abdominal pain, constipation, diarrhea, nausea and vomiting.  Genitourinary: Negative for dysuria.  Musculoskeletal: Negative for joint pain.  Neurological: Positive for focal weakness. Negative for dizziness and headaches.   Exam: Physical Exam  HENT:  Nose: No mucosal edema.  Mouth/Throat: No oropharyngeal exudate or posterior oropharyngeal edema.  Eyes: Pupils are equal, round, and reactive to light. Conjunctivae, EOM and lids are normal.  Neck: No JVD present. Carotid bruit is not present. No edema present. No thyroid mass and no thyromegaly present.  Cardiovascular: S1 normal and S2 normal. Exam reveals no gallop.  No murmur heard. Pulses:      Dorsalis pedis pulses are 2+ on the right side and 2+ on the left side.  Respiratory: No respiratory distress. He has no wheezes. He has no rhonchi. He has no rales.  GI: Soft. Bowel sounds are normal. There is no abdominal tenderness.  Musculoskeletal:     Right ankle: He exhibits no swelling.     Left ankle: He exhibits no swelling.  Lymphadenopathy:    He has no cervical  adenopathy.  Neurological: He is alert. No cranial nerve deficit.  When I distracted him I was able to get him to move his right toes.  I also did see him flex his muscles on his right upper leg and he was able to slightly lift that right knee off the bed.  Skin: Skin is warm. No rash noted. Nails show no clubbing.  Psychiatric: He has a normal mood and affect.      Data Reviewed: Basic Metabolic Panel: Recent Labs  Lab 03/07/19 0510 03/10/19 0419 03/13/19 0954  NA 137  --   --   K 3.7  --   --   CL 105  --   --   CO2 25  --   --   GLUCOSE 139*  --   --   BUN 11  --   --   CREATININE 0.88 1.02 1.11  CALCIUM 9.0  --   --    CBC: Recent Labs  Lab 03/07/19 0510 03/10/19 0419 03/13/19 0954  WBC 7.3 7.8 7.0  HGB 15.3 15.3 15.4  HCT 46.7 45.7 46.0  MCV 89.3 88.1 88.0  PLT 167 175 195     Recent Results (from the past 240 hour(s))  Novel Coronavirus, NAA (hospital order; send-out to ref lab)     Status: None   Collection Time: 03/06/19 11:07 AM  Result Value Ref Range Status   SARS-CoV-2, NAA NOT DETECTED NOT DETECTED Final  Comment: (NOTE) This test was developed and its performance characteristics determined by [employer]. This test has not been FDA cleared or approved. This test has been authorized by FDA under an Emergency Use Authorization (EUA). This test has been validated in accordance with the FDA's Guidance Document (Policy for Diagnostics Testing in Laboratories Certified to Perform High Complexity Testing under CLIA prior to Emergency Use Authorization for Coronavirus Disease-2019 during the [facility] Emergency) issued on February 29th, 2020. FDA independent review of this validation is pending. This test is only authorized for the duration of time the declaration that circumstances exist justifying the authorization of the emergency use of in vitro diagnostic tests for detection of SARS-CoV- 2 virus and/or diagnosis of COVID-19 infection  under section 564(b)(1) of the Act, 21 U.S.C. 409WJX-9(J)(4), unless the authorization is terminated or revoked sooner. Performed At: [facility] [address] [place], [place] [id number] Jolene Schimke MD YQ:[id number]    Coronavirus Source NASOPHARYNGEAL  Final    Comment: Performed at [facility], [address] [place], [place] [address]      Scheduled Meds: . amLODipine  10 mg Oral Daily  . apixaban  5 mg Oral BID  . atorvastatin  20 mg Oral q1800  . feeding supplement (ENSURE ENLIVE)  237 mL Oral BID BM  . folic acid  1 mg Oral Daily  . metoprolol tartrate  25 mg Oral BID  . mirtazapine  15 mg Oral QHS  . multivitamin with minerals  1 tablet Oral Daily  . senna-docusate  2 tablet Oral BID  . tamsulosin  0.4 mg Oral Daily  . thiamine  100 mg Oral Daily  . venlafaxine XR  150 mg Oral Q breakfast    Assessment/Plan:  1. Right lower extremity paralysis.  As per neurology no further work-up.  Patient was able to move his toes a little bit today and some of the muscles in his right upper leg and flexed at the knee slightly.  Repeat CT scan of the head negative for acute events.  Patient is on Eliquis already for portal vein thrombosis.  Looking into placement.  MRI of lumbar and thoracic spine negative.  Continue to monitor. 2. Sepsis.  [name] in the blood on 01/20/2019.  Completed a course of antibiotics. 3. Acute encephalopathy.  Possibility of rapidly progressive dementia.  Nonreactive RPR.  B12 in normal range.  TSH normal range.  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  Patient answers some yes or no questions. 4. Depression on Remeron 5. Portal vein thrombosis on Eliquis 6. Chronic hypertension on Norvasc 7. Hyperlipidemia unspecified on atorvastatin 8. COVID-19 test negative.  It was ordered because we are thinking about rehab.  Not because the patient had symptoms.  Code Status:     Code Status Orders  (From  admission, onward)         Start     Ordered   01/20/19 1235  Full code  Continuous     01/20/19 1235        Code Status History    This patient has a current code status but no historical code status.    Advance Directive Documentation     Most Recent Value  Type of Advance Directive  Healthcare Power of Attorney  Pre-existing out of facility DNR order (yellow form or pink MOST form)  -  "MOST" Form in Place?  -     Disposition Plan: Trying to obtain legal guardian.  Time spent: 25 minutes.  Zerah Hilyer [employer]  [employer]

## 2019-03-13 NOTE — Progress Notes (Signed)
Physical Therapy Treatment Patient Details Name: Maxwell GurneyHansy Lipsett MRN: [id number] DOB: [birth year] Today's Date: 03/13/2019    History of Present Illness Pt. is a 58 yo male [facility] c AMS, confusion, and fever, admitted with sepsis and related encephalopathy. 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 Pt showed good improvement in language fluency working with SLP, but memory and attention deficits persisted despite OT services. PTA pt was living with his spouse, AMB freely without assistive device or difficulty, but needed assistance with ADL/IADL and close supervision due to lability and severe ST/LT memory impairment. [facility], pt hd also made good progress working with psychiatry.  PMHx: portal vein thrombosis, CVA, HTN, erythrocytosis.     PT Comments    Patient agreeable to PT with encouragement. AAROM necessary for bed level therapeutic exercises on RLE, some improved RLE activation with extended time. Patient performed supine to sit with modA, step by step sequencing. Pt able to sit EOB ~3210minutes, exhibited fatigue as well as R lateral lean as time progressed. OT in room at end of session to see patient, PT + OT assisted pt to return to supine modAx2. The patient demonstrated limitations in progression towards goals this session. The patient would benefit from continued skilled PT intervention to address limitations and maintain functional mobility.      Follow Up Recommendations  SNF     Equipment Recommendations  Wheelchair (measurements PT);Wheelchair cushion (measurements PT);3in1 (PT)    Recommendations for Other Services       Precautions / Restrictions Precautions Precautions: Fall Restrictions Weight Bearing Restrictions: No    Mobility  Bed Mobility Overal bed mobility:  Needs Assistance Bed Mobility: Sit to Supine     Supine to sit: Mod assist;+2 for safety/equipment Sit to supine: +2 for safety/equipment;Mod assist   General bed mobility comments: Utilized +2 to get back in bed, with minimal effort from Pt  Transfers Overall transfer level: Needs assistance Equipment used: Rolling walker (2 wheeled)   Sit to Stand: Max assist;+2 physical assistance         General transfer comment: Pt attempted to initiate movement, exhibited shakiness, trembling  Ambulation/Gait             General Gait Details: unable to ambulate this session due to fatigue   Stairs             Wheelchair Mobility    Modified Rankin (Stroke Patients Only)       Balance Overall balance assessment: Needs assistance Sitting-balance support: Feet supported   Sitting balance - Comments: Pt demonstrated R lateral lean while sitting today. ~10 minutes exhibited fatigue       Standing balance comment: Patient reliant on RW for support; requires assistance for stabilization and support max A +2                            Cognition Arousal/Alertness: Awake/alert Behavior During Therapy: WFL for tasks assessed/performed Overall Cognitive Status: Within Functional Limits for tasks assessed Area of Impairment: Memory;Attention                   Current Attention Level: Selective Memory: Decreased recall of precautions;Decreased short-term memory Following Commands: Follows one step commands inconsistently Safety/Judgement: Decreased awareness of safety   Problem Solving: Slow processing;Requires tactile cues;Decreased initiation;Difficulty  sequencing;Requires verbal cues General Comments: Baseline heavy ST/LT memory impairment. Pt also has a history of aphasia, now still seems to struggle with language in describing his symptoms and complaints.       Exercises General Exercises - Lower Extremity Ankle Circles/Pumps: AAROM;Right;15  reps;Left Quad Sets: AROM;Left;15 reps Heel Slides: AAROM;Right;15 reps;AROM;Left Other Exercises Other Exercises: Pt. performed BUE strengthening for bilateral horzontal abduction, shoulder flexion, extension bilateral elbow flexion, extension 1 set 10 to 20 reps each with cues to perfrom at a slower pace, and visual demonstration.    General Comments        Pertinent Vitals/Pain Pain Assessment: No/denies pain    Home Living                      Prior Function            PT Goals (current goals can now be found in the care plan section) Progress towards PT goals: (pt demonstrated regression this session)    Frequency    Min 2X/week      PT Plan Current plan remains appropriate    Co-evaluation   Reason for Co-Treatment: Complexity of the patient's impairments (multi-system involvement)   OT goals addressed during session: ADL's and self-care;Strengthening/ROM      AM-PAC PT "6 Clicks" Mobility   Outcome Measure  Help needed turning from your back to your side while in a flat bed without using bedrails?: A Lot Help needed moving from lying on your back to sitting on the side of a flat bed without using bedrails?: A Lot Help needed moving to and from a bed to a chair (including a wheelchair)?: A Lot Help needed standing up from a chair using your arms (e.g., wheelchair or bedside chair)?: A Lot Help needed to walk in hospital room?: A Lot Help needed climbing 3-5 steps with a railing? : Total 6 Click Score: 11    End of Session Equipment Utilized During Treatment: Gait belt Activity Tolerance: Treatment limited secondary to medical complications (Comment) Patient left: in bed;with nursing/sitter in room;with call bell/phone within reach Nurse Communication: Mobility status PT Visit Diagnosis: Unsteadiness on feet (R26.81);Other abnormalities of gait and mobility (R26.89);Adult, failure to thrive (R62.7)     Time: 5102-5852 PT Time Calculation  (min) (ACUTE ONLY): 31 min  Charges:  $Therapeutic Exercise: 8-22 mins $Therapeutic Activity: 8-22 mins                     Olga Coaster PT, DPT 3:59 PM,03/13/19 [phone]

## 2019-03-13 NOTE — Progress Notes (Signed)
Occupational Therapy Treatment Patient Details Name: Maxwell Miller MRN: [id number] DOB: 12/30/1960 Today's Date: 03/13/2019    History of present illness Pt. is a 58 yo male who comes to [facility] on 01/20/19 c AMS, confusion, and fever, admitted with sepsis and related encephalopathy. 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 Pt showed good improvement in language fluency working with SLP, but memory and attention deficits persisted despite OT services. PTA pt was living with his spouse, AMB freely without assistive device or difficulty, but needed assistance with ADL/IADL and close supervision due to lability and severe ST/LT memory impairment. Per neuro notes at [facility], pt hd also made good progress working with psychiatry.  PMHx: portal vein thrombosis, CVA, HTN, erythrocytosis.    OT comments  Pt. continues to present with weakness limiting functional mobility, and ADL functioning. Pt. tolerated UE there. ex. well requiring visual demonstration for proper technique, and for slower pace. Pt. Could benefit from upgrading theraband from red to green resistance next session. Pt. Continues to benefit from OT services for ADL training, A/E training, UE there. Ex.,and pt. education about home modification, and DME. Pt. would benefit from SNF level of care upon discharge. Pt. continues to benefit from follow-up OT services at discharge.   Follow Up Recommendations  SNF;Supervision/Assistance - 24 hour    Equipment Recommendations  3 in 1 bedside commode    Recommendations for Other Services      Precautions / Restrictions Precautions Precautions: Fall Restrictions Weight Bearing Restrictions: No       Mobility Bed Mobility Overal bed mobility: Needs Assistance Bed Mobility: Sit to Supine     Supine to sit: Mod assist;+2 for  safety/equipment Sit to supine: +2 for safety/equipment;Mod assist   General bed mobility comments: Utilized +2 to get back in bed, with minimal effort from Pt  Transfers Overall transfer level: Needs assistance Equipment used: Rolling walker (2 wheeled)   Sit to Stand: Max assist;+2 physical assistance         General transfer comment: Pt attempted to initiate movement, exhibited shakiness, trembling    Balance Overall balance assessment: Needs assistance Sitting-balance support: Feet supported          Standing balance comment: Patient reliant on RW for support; requires assistance for stabilization and support max A +2                           ADL either performed or assessed with clinical judgement   ADL Overall ADL's : Needs assistance/impaired Eating/Feeding: Sitting;Set up;Supervision/ safety   Grooming: Set up   Upper Body Bathing: Set up;Minimal assistance Upper Body Bathing Details (indicate cue type and reason): Pt completed seated bath with set-up assist from OT on this date. Pt required some light VCs for sequencing, but generally did very well with this task. Min assist provided to wash and apply lotion to pt back. Lower Body Bathing: Set up;Moderate assistance;Sitting/lateral leans Lower Body Bathing Details (indicate cue type and reason): Pt declined LB bathing during seated bath on this date. Suspect pt req mod assist for LEs, otherwise appeared very able to clean self during upper body bathing.  Upper Body Dressing : Minimal assistance;Set up   Lower Body Dressing: Maximal assistance               Functional mobility during ADLs: +2 for physical  assistance;Maximal assistance       Vision       Perception     Praxis      Cognition Arousal/Alertness: Awake/alert Behavior During Therapy: WFL for tasks assessed/performed Overall Cognitive Status: Within Functional Limits for tasks assessed Area of Impairment: Memory;Attention                    Current Attention Level: Selective Memory: Decreased recall of precautions;Decreased short-term memory Following Commands: Follows one step commands inconsistently Safety/Judgement: Decreased awareness of safety   Problem Solving: Slow processing;Requires tactile cues;Decreased initiation;Difficulty sequencing;Requires verbal cues General Comments: Baseline heavy ST/LT memory impairment. Pt also has a history of aphasia, now still seems to struggle with language in describing his symptoms and complaints.         Exercises  Other Exercises Other Exercises: Pt. performed BUE strengthening with red theraband for bilateral horzontal abduction, shoulder flexion, extension bilateral elbow flexion, extension 1 set 10 to 20 reps each with cues to perfrom at a slower pace, and visual demonstration.   Shoulder Instructions       General Comments      Pertinent Vitals/ Pain       Pain Assessment: No/denies pain  Home Living                                          Prior Functioning/Environment              Frequency  Min 1X/week        Progress Toward Goals  OT Goals(current goals can now be found in the care plan section)  Progress towards OT goals: Progressing toward goals     Plan Discharge plan remains appropriate;Frequency remains appropriate    Co-evaluation    PT/OT/SLP Co-Evaluation/Treatment: Yes Reason for Co-Treatment: Complexity of the patient's impairments (multi-system involvement)   OT goals addressed during session: ADL's and self-care;Strengthening/ROM      AM-PAC OT "6 Clicks" Daily Activity     Outcome Measure   Help from another person eating meals?: None Help from another person taking care of personal grooming?: None Help from another person toileting, which includes using toliet, bedpan, or urinal?: A Lot Help from another person bathing (including washing, rinsing, drying)?: A Lot Help from  another person to put on and taking off regular upper body clothing?: A Little Help from another person to put on and taking off regular lower body clothing?: A Lot 6 Click Score: 17    End of Session Equipment Utilized During Treatment: Gait belt;Rolling walker  OT Visit Diagnosis: Other abnormalities of gait and mobility (R26.89);Muscle weakness (generalized) (M62.81)   Activity Tolerance Patient tolerated treatment well   Patient Left in bed;with call bell/phone within reach;with bed alarm set   Nurse Communication          Time: [id number]  Co treat with PT [id number] OT Time Calculation (min): 25 min  Charges: OT General Charges $OT Visit: 1 Visit OT Treatments $Self Care/Home Management : 23-37 mins  Olegario MessierElaine Cory Rama, MS, OTR/L   Olegario Messierlaine Jules Baty 03/13/2019, 3:54 PM

## 2019-03-14 NOTE — Progress Notes (Signed)
Patient ID: Jayro Castner, male   DOB: Mar 28, 1961, 58 y.o.   MRN: [id number]  [employer] PROGRESS NOTE  Chatham Bredehoft OFB:[id number] DOB: Aug 13, 1961 DOA: 01/20/2019 PCP: Mable Paris, PA-C  HPI/Subjective:  Patient stares blankly when asked how he is doing and responded   Objective: Vitals:   03/14/19 0445 03/14/19 0935  BP: 119/85 136/87  Pulse: 73 82  Resp: 17 18  Temp: 98.2 F (36.8 C) 97.8 F (36.6 C)  SpO2: 96% 99%    Filed Weights   01/20/19 0826 01/29/19 0340 02/12/19 0437  Weight: 81.6 kg 86 kg 87.6 kg    ROS: Review of Systems  Unable to perform ROS: Dementia  Respiratory: Negative for cough and shortness of breath.   Cardiovascular: Negative for chest pain.  Gastrointestinal: Negative for abdominal pain, constipation, diarrhea, nausea and vomiting.  Genitourinary: Negative for dysuria.  Musculoskeletal: Negative for joint pain.  Neurological: Positive for focal weakness. Negative for dizziness and headaches.   Exam: Physical Exam  HENT:  Nose: No mucosal edema.  Mouth/Throat: No oropharyngeal exudate or posterior oropharyngeal edema.  Eyes: Pupils are equal, round, and reactive to light. Conjunctivae, EOM and lids are normal.  Neck: No JVD present. Carotid bruit is not present. No edema present. No thyroid mass and no thyromegaly present.  Cardiovascular: S1 normal and S2 normal. Exam reveals no gallop.  No murmur heard. Pulses:      Dorsalis pedis pulses are 2+ on the right side and 2+ on the left side.  Respiratory: No respiratory distress. He has no wheezes. He has no rhonchi. He has no rales.  GI: Soft. Bowel sounds are normal. There is no abdominal tenderness.  Musculoskeletal:     Right ankle: He exhibits no swelling.     Left ankle: He exhibits no swelling.  Lymphadenopathy:    He has no cervical adenopathy.  Neurological: He is alert. No cranial nerve deficit.  Skin: Skin is warm. No rash noted. Nails show no clubbing.   Psychiatric: He has a normal mood and affect.      Data Reviewed: Basic Metabolic Panel: Recent Labs  Lab 03/10/19 0419 03/13/19 0954  CREATININE 1.02 1.11   CBC: Recent Labs  Lab 03/10/19 0419 03/13/19 0954  WBC 7.8 7.0  HGB 15.3 15.4  HCT 45.7 46.0  MCV 88.1 88.0  PLT 175 195     Recent Results (from the past 240 hour(s))  Novel Coronavirus, NAA (hospital order; send-out to ref lab)     Status: None   Collection Time: 03/06/19 11:07 AM  Result Value Ref Range Status   SARS-CoV-2, NAA NOT DETECTED NOT DETECTED Final    Comment: (NOTE) This test was developed and its performance characteristics determined by [employer]. This test has not been FDA cleared or approved. This test has been authorized by FDA under an Emergency Use Authorization (EUA). This test has been validated in accordance with the FDA's Guidance Document (Policy for Diagnostics Testing in Laboratories Certified to Perform High Complexity Testing under CLIA prior to Emergency Use Authorization for Coronavirus Disease-2019 during the [facility] Emergency) issued on February 29th, 2020. FDA independent review of this validation is pending. This test is only authorized for the duration of time the declaration that circumstances exist justifying the authorization of the emergency use of in vitro diagnostic tests for detection of SARS-CoV- 2 virus and/or diagnosis of COVID-19 infection under section 564(b)(1) of the Act, 21 U.S.C. 782UMP-5(T)(6), unless the authorization is terminated or revoked sooner. Performed At:  [facility] [place] [address] [place], [place] [id number] Jolene SchimkeNagendra Sanjai MD WU:[id number]:[phone]    Coronavirus Source NASOPHARYNGEAL  Final    Comment: Performed at [facility], [address] [place], [place] [address]      Scheduled Meds: . amLODipine  10 mg Oral Daily  . apixaban  5 mg Oral BID  . atorvastatin  20 mg Oral q1800  . feeding supplement  (ENSURE ENLIVE)  237 mL Oral BID BM  . folic acid  1 mg Oral Daily  . metoprolol tartrate  25 mg Oral BID  . mirtazapine  15 mg Oral QHS  . multivitamin with minerals  1 tablet Oral Daily  . senna-docusate  2 tablet Oral BID  . tamsulosin  0.4 mg Oral Daily  . thiamine  100 mg Oral Daily  . venlafaxine XR  150 mg Oral Q breakfast    Assessment/Plan:  1. Right lower extremity paralysis.  As per neurology no further work-up.  Patient was able to move his toes a little bit today and some of the muscles in his right upper leg and flexed at the knee slightly.  Repeat CT scan of the head negative for acute events.  Patient is on Eliquis already for portal vein thrombosis.  Looking into placement.  MRI of lumbar and thoracic spine negative.  Continue to monitor. 2. Sepsis.  [name] in the blood on 01/20/2019.  Completed a course of antibiotics. 3. Acute encephalopathy.  Possibility of rapidly progressive dementia.  Nonreactive RPR.  B12 in normal range.  TSH normal range.  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  Patient answers some yes or no questions. 4. Depression on Remeron 5. Portal vein thrombosis on Eliquis 6. Chronic hypertension on Norvasc 7. Hyperlipidemia unspecified on atorvastatin 8. COVID-19 test negative.  It was ordered because we are thinking about rehab.  Not because the patient had symptoms.  Code Status:     Code Status Orders  (From admission, onward)         Start     Ordered   01/20/19 1235  Full code  Continuous     01/20/19 1235        Code Status History    This patient has a current code status but no historical code status.    Advance Directive Documentation     Most Recent Value  Type of Advance Directive  Healthcare Power of Attorney  Pre-existing out of facility DNR order (yellow form or pink MOST form)  -  "MOST" Form in Place?  -     Disposition Plan: Trying to obtain legal guardian.  Time spent: 25  minutes.  Blakleigh Straw [employer]  [employer]

## 2019-03-14 NOTE — Progress Notes (Signed)
Physical Therapy Treatment Patient Details Name: Maxwell Miller MRN: [id number] DOB: 04/28/1961 Today's Date: 03/14/2019    History of Present Illness Pt. is a 58 yo male who comes to [facility] on 01/20/19 c AMS, confusion, and fever, admitted with sepsis and related encephalopathy. 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 Pt showed good improvement in language fluency working with SLP, but memory and attention deficits persisted despite OT services. PTA pt was living with his spouse, AMB freely without assistive device or difficulty, but needed assistance with ADL/IADL and close supervision due to lability and severe ST/LT memory impairment. Per neuro notes at [facility], pt hd also made good progress working with psychiatry.  PMHx: portal vein thrombosis, CVA, HTN, erythrocytosis.     PT Comments    Pt continues to be quite limited functionally and has inconsistent limitation, c/o pain and tolerance to activities.  He reported today that his R arm is "dead" and R knee is "dying" and though he struggled to do a lot of purposeful activity with either he clearly had some ability to use both of them in at least a minimal way.  Pt willing and almost eager to try some activity, but then very inconsistent with ability to participate consistently and effectively.   Extremely heavy assist to attain standing, poor balance in sitting at EOB and generally very limited functionally.    Follow Up Recommendations  SNF     Equipment Recommendations  Wheelchair (measurements PT);Wheelchair cushion (measurements PT);3in1 (PT)    Recommendations for Other Services       Precautions / Restrictions Precautions Precautions: Fall Restrictions Weight Bearing Restrictions: No    Mobility  Bed Mobility Overal bed mobility: Needs Assistance Bed Mobility:  Sit to Supine;Supine to Sit     Supine to sit: Max assist Sit to supine: Max assist   General bed mobility comments: No AROM in R, labored/minimal AROM on L  Transfers Overall transfer level: Needs assistance Equipment used: Rolling walker (2 wheeled) Transfers: Sit to/from Stand Sit to Stand: Total assist         General transfer comment: Pt needed excessive assist to attain standing this date.  First attempt to rise from standard height unsuccessfully, then with elevated bed height and very heavy assist from PT we were able to assume standing with heavy reliance on walker and unsteadiness t/o the effort  Ambulation/Gait             General Gait Details: Pt unable to even attempt shift of either LE even minimally to side step, etc at [employer]   Stairs             Wheelchair Mobility    Modified Rankin (Stroke Patients Only)       Balance Overall balance assessment: Needs assistance Sitting-balance support: Feet supported Sitting balance-Leahy Scale: Poor Sitting balance - Comments: Pt exhibited inconsistent relatively quick velocity falling to R LOBs in sitting despite assist to use UEs and position appropriately      Standing balance-Leahy Scale: Zero Standing balance comment: Pt ultimately did very poorly regarding standing balance.  Pt with heavy reliance on walker and still needed mod/max assist the entire time to maintain standing                            Cognition Arousal/Alertness: Awake/alert Behavior During Therapy:  Flat affect(inconsistent with ability to hold appropriate conversation) Overall Cognitive Status: Within Functional Limits for tasks assessed Area of Impairment: Memory;Attention                   Current Attention Level: Selective                  Exercises General Exercises - Lower Extremity Ankle Circles/Pumps: AAROM;15 reps;AROM;Both(AAROM only on R) Heel Slides: Both;AAROM;Strengthening;15  reps(resisted leg extension ) Hip ABduction/ADduction: AAROM;AROM;Both;15 reps(AAROM on R) Straight Leg Raises: AAROM;Both;15 reps    General Comments        Pertinent Vitals/Pain Pain Assessment: No/denies pain    Home Living                      Prior Function            PT Goals (current goals can now be found in the care plan section) Progress towards PT goals: Not progressing toward goals - comment    Frequency    Min 2X/week      PT Plan Current plan remains appropriate    Co-evaluation              AM-PAC PT "6 Clicks" Mobility   Outcome Measure  Help needed turning from your back to your side while in a flat bed without using bedrails?: A Lot Help needed moving from lying on your back to sitting on the side of a flat bed without using bedrails?: A Lot Help needed moving to and from a bed to a chair (including a wheelchair)?: Total Help needed standing up from a chair using your arms (e.g., wheelchair or bedside chair)?: Total Help needed to walk in hospital room?: Total Help needed climbing 3-5 steps with a railing? : Total 6 Click Score: 8    End of Session Equipment Utilized During Treatment: Gait belt Activity Tolerance: Patient limited by pain;Patient limited by fatigue;Treatment limited secondary to medical complications (Comment) Patient left: with bed alarm set;with call bell/phone within reach Nurse Communication: Mobility status PT Visit Diagnosis: Unsteadiness on feet (R26.81);Other abnormalities of gait and mobility (R26.89);Adult, failure to thrive (R62.7)     Time: [id number] PT Time Calculation (min) (ACUTE ONLY): 32 min  Charges:  $Therapeutic Exercise: 8-22 mins $Therapeutic Activity: 8-22 mins                     Malachi ProGalen R Rihanna Marseille, DPT 03/14/2019, 3:36 PM

## 2019-03-14 NOTE — Progress Notes (Signed)
Occupational Therapy Treatment Patient Details Name: Maxwell Miller MRN: [id number] DOB: [birth year] Today's Date: 03/14/2019    History of present illness Pt. is a 58 yo male who comes to [facility] on 01/20/19 c AMS, confusion, and fever, admitted with sepsis and related encephalopathy. 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 Pt showed good improvement in language fluency working with SLP, but memory and attention deficits persisted despite OT services. PTA pt was living with his spouse, AMB freely without assistive device or difficulty, but needed assistance with ADL/IADL and close supervision due to lability and severe ST/LT memory impairment. Per neuro notes at [facility], pt hd also made good progress working with psychiatry.  PMHx: portal vein thrombosis, CVA, HTN, erythrocytosis.    OT comments  Pt. tolerated upgraded resistance for UE there. Ex strengthening well. Pt. continues to benefit from OT services for ADL training, A/E training, UE there. Ex., neuromuscular re-education, and pt. education about home modification, and DME to improve strength, and engagement in ADLs, and IADL tasks in order to return to his PLOF at his previous living environment. Pt. Continues to be appropriate for SNF level of care, with follow-up OT serivces.    Follow Up Recommendations  SNF;Supervision/Assistance - 24 hour    Equipment Recommendations  3 in 1 bedside commode    Recommendations for Other Services      Precautions / Restrictions Precautions Precautions: Fall Restrictions Weight Bearing Restrictions: No       Mobility Bed Mobility Overal bed mobility: Needs Assistance Bed Mobility: Sit to Supine;Supine to Sit     Supine to sit: Max assist Sit to supine: Max assist   General bed mobility comments: Pt. seen at  bedside  Transfers Overall transfer level: Needs assistance Equipment used: Rolling walker (2 wheeled) Transfers: Sit to/from Stand Sit to Stand: Total assist            Balance                            ADL either performed or assessed with clinical judgement   ADL Overall ADL's : Needs assistance/impaired Eating/Feeding: Sitting;Set up;Supervision/ safety   Grooming: Set up Grooming Details (indicate cue type and reason): Pt able to complete seated grooming task on this date including oral care, wash/dry face, and application of deodorant with set-up assist. Pt able to sequence tasks well and maintain seated balance t/o.  Upper Body Bathing: Set up;Minimal assistance   Lower Body Bathing: Set up;Maximal assistance   Upper Body Dressing : Minimal assistance;Set up   Lower Body Dressing: Maximal assistance                       Vision       Perception     Praxis      Cognition Arousal/Alertness: Awake/alert Behavior During Therapy: Flat affect Overall Cognitive Status: Within Functional Limits for tasks assessed Area of Impairment: Memory;Attention                   Current Attention Level: Selective Memory: Decreased recall of precautions;Decreased short-term memory Following Commands: Follows one step commands inconsistently Safety/Judgement: Decreased awareness of safety   Problem Solving: Slow processing;Requires tactile cues;Decreased initiation;Difficulty sequencing;Requires verbal cues          Exercises Pt. tolerated upgraded ther.  ex to  green theraband for bilateral shoulder flexion, extension, horizontal abduction, and blue theraband for bilateral elbow flexion, and extension 1 set 20 reps each with cues for form, and pace.  Shoulder Instructions       General Comments      Pertinent Vitals/ Pain       Pain Assessment: No/denies pain  Home Living                                           Prior Functioning/Environment              Frequency  Min 1X/week        Progress Toward Goals  OT Goals(current goals can now be found in the care plan section)  Progress towards OT goals: Progressing toward goals  Acute Rehab OT Goals Patient Stated Goal: Pt. regain independence OT Goal Formulation: With patient Time For Goal Achievement: 03/18/19 Potential to Achieve Goals: Good  Plan Discharge plan remains appropriate;Frequency remains appropriate    Co-evaluation                 AM-PAC OT "6 Clicks" Daily Activity     Outcome Measure   Help from another person eating meals?: None Help from another person taking care of personal grooming?: None Help from another person toileting, which includes using toliet, bedpan, or urinal?: A Lot Help from another person bathing (including washing, rinsing, drying)?: A Lot Help from another person to put on and taking off regular upper body clothing?: A Little Help from another person to put on and taking off regular lower body clothing?: A Lot 6 Click Score: 17    End of Session Equipment Utilized During Treatment: Gait belt;Rolling walker  OT Visit Diagnosis: Other abnormalities of gait and mobility (R26.89);Muscle weakness (generalized) (M62.81)   Activity Tolerance Patient tolerated treatment well   Patient Left in bed;with call bell/phone within reach;with bed alarm set   Nurse Communication          Time: [id number] OT Time Calculation (min): 23 min  Charges: OT General Charges $OT Visit: 1 Visit OT Treatments $Self Care/Home Management : 23-37 mins  Olegario MessierElaine Zaine Elsass, MS, OTR/L  Olegario Messierlaine Shalla Bulluck 03/14/2019, 4:30 PM

## 2019-03-15 NOTE — Progress Notes (Signed)
Patient ID: Maxwell Miller, male   DOB: [birth year], 58 y.o.   MRN: [id number]  [employer] PROGRESS NOTE  Maxwell Miller [id number]:[id number] DOB: 15-Oct-1961 DOA: 01/20/2019 PCP: Maxwell Paris, PA-C  HPI/Subjective:  Eating breakfast denying any complaints   Objective: Vitals:   03/15/19 0453 03/15/19 0827  BP: 124/86 (!) 146/93  Pulse: 70 77  Resp: 17 18  Temp: 98 F (36.7 C) 98.1 F (36.7 C)  SpO2: 98% 96%    Filed Weights   01/20/19 0826 01/29/19 0340 02/12/19 0437  Weight: 81.6 kg 86 kg 87.6 kg    ROS: Review of Systems  Unable to perform ROS: Dementia  Respiratory: Negative for cough and shortness of breath.   Cardiovascular: Negative for chest pain.  Gastrointestinal: Negative for abdominal pain, constipation, diarrhea, nausea and vomiting.  Genitourinary: Negative for dysuria.  Musculoskeletal: Negative for joint pain.  Neurological: Positive for focal weakness. Negative for dizziness and headaches.   Exam: Physical Exam  HENT:  Nose: No mucosal edema.  Mouth/Throat: No oropharyngeal exudate or posterior oropharyngeal edema.  Eyes: Pupils are equal, round, and reactive to light. Conjunctivae, EOM and lids are normal.  Neck: No JVD present. Carotid bruit is not present. No edema present. No thyroid mass and no thyromegaly present.  Cardiovascular: S1 normal and S2 normal. Exam reveals no gallop.  No murmur heard. Pulses:      Dorsalis pedis pulses are 2+ on the right side and 2+ on the left side.  Respiratory: No respiratory distress. He has no wheezes. He has no rhonchi. He has no rales.  GI: Soft. Bowel sounds are normal. There is no abdominal tenderness.  Musculoskeletal:     Right ankle: He exhibits no swelling.     Left ankle: He exhibits no swelling.  Lymphadenopathy:    He has no cervical adenopathy.  Neurological: He is alert. No cranial nerve deficit.  Skin: Skin is warm. No rash noted. Nails show no clubbing.  Psychiatric: He has a normal  mood and affect.      Data Reviewed: Basic Metabolic Panel: Recent Labs  Lab 03/10/19 0419 03/13/19 0954  CREATININE 1.02 1.11   CBC: Recent Labs  Lab 03/10/19 0419 03/13/19 0954  WBC 7.8 7.0  HGB 15.3 15.4  HCT 45.7 46.0  MCV 88.1 88.0  PLT 175 195     Recent Results (from the past 240 hour(s))  Novel Coronavirus, NAA (hospital order; send-out to ref lab)     Status: None   Collection Time: 03/06/19 11:07 AM  Result Value Ref Range Status   SARS-CoV-2, NAA NOT DETECTED NOT DETECTED Final    Comment: (NOTE) This test was developed and its performance characteristics determined by [employer]. This test has not been FDA cleared or approved. This test has been authorized by FDA under an Emergency Use Authorization (EUA). This test has been validated in accordance with the FDA's Guidance Document (Policy for Diagnostics Testing in Laboratories Certified to Perform High Complexity Testing under CLIA prior to Emergency Use Authorization for Coronavirus Disease-2019 during the [facility] Emergency) issued on February 29th, 2020. FDA independent review of this validation is pending. This test is only authorized for the duration of time the declaration that circumstances exist justifying the authorization of the emergency use of in vitro diagnostic tests for detection of SARS-CoV- 2 virus and/or diagnosis of COVID-19 infection under section 564(b)(1) of the Act, 21 U.S.C. 201EOF-1(Q)(1), unless the authorization is terminated or revoked sooner. Performed At: [facility] [phone]  [address] [place], [place] [id number] Jolene SchimkeNagendra Sanjai MD WU:[id number]:[phone]    Coronavirus Source NASOPHARYNGEAL  Final    Comment: Performed at [facility], [address] [place], [place] [address]      Scheduled Meds: . amLODipine  10 mg Oral Daily  . apixaban  5 mg Oral BID  . atorvastatin  20 mg Oral q1800  . feeding supplement (ENSURE ENLIVE)  237 mL Oral BID  BM  . folic acid  1 mg Oral Daily  . metoprolol tartrate  25 mg Oral BID  . mirtazapine  15 mg Oral QHS  . multivitamin with minerals  1 tablet Oral Daily  . senna-docusate  2 tablet Oral BID  . tamsulosin  0.4 mg Oral Daily  . thiamine  100 mg Oral Daily  . venlafaxine XR  150 mg Oral Q breakfast    Assessment/Plan:  1. Right lower extremity paralysis.  As per neurology no further work-up.  Patient was able to move his toes a little bit today and some of the muscles in his right upper leg and flexed at the knee slightly.  Repeat CT scan of the head negative for acute events.  Patient is on Eliquis already for portal vein thrombosis.  Waiting for placement.  MRI of lumbar and thoracic spine negative.  Continue to monitor. 2. Sepsis.  [name] in the blood on 01/20/2019.  Completed a course of antibiotics. 3. Acute encephalopathy.  Possibility of rapidly progressive dementia.  Nonreactive RPR.  B12 in normal range.  TSH normal range.  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  Patient answers some yes or no questions. 4. Depression continue with Remeron 5. Portal vein thrombosis on Eliquis 6. Chronic hypertension continue Norvasc 7. Hyperlipidemia unspecified on atorvastatin 8. COVID-19 test negative.  It was ordered because we are thinking about rehab.  Not because the patient had symptoms.  Code Status:     Code Status Orders  (From admission, onward)         Start     Ordered   01/20/19 1235  Full code  Continuous     01/20/19 1235        Code Status History    This patient has a current code status but no historical code status.    Advance Directive Documentation     Most Recent Value  Type of Advance Directive  Healthcare Power of Attorney  Pre-existing out of facility DNR order (yellow form or pink MOST form)  -  "MOST" Form in Place?  -     Disposition Plan: Trying to obtain legal guardian.  Time spent: 25 minutes.  Maxwell Miller  [employer]  [employer]

## 2019-03-16 NOTE — Progress Notes (Signed)
OT Cancellation Note  Patient Details Name: Marek Nabors MRN: [id number] DOB: 1961/08/13   Cancelled Treatment:    Reason Eval/Treat Not Completed: Patient declined, no reason specified. Attempted to see pt for OT tx session on this date. Upon arrival to room pt asleep, supine in bed. Pt slow to rouse to VC's. Pt opened eyes on third attempt to wake. OT re-introduced self and asked if pt agreeable to OT tx. Pt requested therapist return at a later time as he was not feeling well and wanted to sleep. Will re-attempt at a later time/date as available and pt medically appropriate for OT tx.   Rockney Ghee, M.S., OTR/L Ascom: [phone] 03/16/19, 11:28 AM

## 2019-03-16 NOTE — [place] FL2 (Signed)
[place] MEDICAID FL2 LEVEL OF CARE SCREENING TOOL     IDENTIFICATION  Patient Name: Maxwell Miller Birthdate: [birth year] Sex: male Admission Date (Current Location): 01/20/2019  [place] and [place] Number:  Chiropodist and Address:  [facility], [address], [place], [place] [address]      Provider Number: [id number]  Attending Physician Name and Address:  Auburn Bilberry, MD  Relative Name and Phone Number:       Current Level of Care: Hospital Recommended Level of Care: Skilled Nursing Facility Prior Approval Number:    Date Approved/Denied:   PASRR Number: pending  Discharge Plan: SNF    Current Diagnoses: Patient Active Problem List   Diagnosis Date Noted  . Malnutrition of moderate degree 01/30/2019  . Sepsis (HCC) 01/20/2019    Orientation RESPIRATION BLADDER Height & Weight     Self, Place  Normal Continent Weight: 193 lb 1.6 oz (87.6 kg) Height:  5\' 11"  (180.3 cm)  BEHAVIORAL SYMPTOMS/MOOD NEUROLOGICAL BOWEL NUTRITION STATUS  (none) (None) Continent Diet(Regular )  AMBULATORY STATUS COMMUNICATION OF NEEDS Skin   Extensive Assist Verbally Normal                       Personal Care Assistance Level of Assistance  Bathing, Feeding, Dressing Bathing Assistance: Limited assistance Feeding assistance: Independent Dressing Assistance: Limited assistance     Functional Limitations Info  Sight, Hearing, Speech Sight Info: Adequate Hearing Info: Adequate Speech Info: Adequate    SPECIAL CARE FACTORS FREQUENCY  PT (By licensed PT), OT (By licensed OT)                    Contractures Contractures Info: Not present    Additional Factors Info  Code Status, Allergies Code Status Info: Full Code  Allergies Info: NKA           Current Medications (03/16/2019):  This is the current hospital active medication list Current Facility-Administered Medications  Medication Dose Route Frequency Provider  Last Rate Last Dose  . acetaminophen (TYLENOL) tablet 650 mg  650 mg Oral Q6H PRN Adrian Saran, MD   650 mg at 02/25/19 2122   Or  . acetaminophen (TYLENOL) suppository 650 mg  650 mg Rectal Q6H PRN Adrian Saran, MD      . amLODipine (NORVASC) tablet 10 mg  10 mg Oral Daily Enedina Finner, MD   10 mg at 03/16/19 0905  . apixaban (ELIQUIS) tablet 5 mg  5 mg Oral BID Auburn Bilberry, MD   5 mg at 03/16/19 5974  . atorvastatin (LIPITOR) tablet 20 mg  20 mg Oral q1800 Auburn Bilberry, MD   20 mg at 03/15/19 1843  . bisacodyl (DULCOLAX) suppository 10 mg  10 mg Rectal Daily PRN Enid Baas, MD   10 mg at 02/01/19 0000  . feeding supplement (ENSURE ENLIVE) (ENSURE ENLIVE) liquid 237 mL  237 mL Oral BID BM Katha Hamming, MD   237 mL at 03/16/19 1306  . folic acid (FOLVITE) tablet 1 mg  1 mg Oral Daily Katha Hamming, MD   1 mg at 03/16/19 0905  . metoprolol tartrate (LOPRESSOR) tablet 25 mg  25 mg Oral BID Enid Baas, MD   25 mg at 03/16/19 0630  . mirtazapine (REMERON SOL-TAB) disintegrating tablet 15 mg  15 mg Oral QHS Enedina Finner, MD   15 mg at 03/14/19 2102  . multivitamin with minerals tablet 1 tablet  1 tablet Oral Daily Katha Hamming, MD  1 tablet at 03/16/19 0905  . ondansetron (ZOFRAN) tablet 4 mg  4 mg Oral Q6H PRN Mody, Sital, MD       Or  . ondansetron (ZOFRAN) injection 4 mg  4 mg Intravenous Q6H PRN Mody, Sital, MD      . polyethylene glycol (MIRALAX / GLYCOLAX) packet 17 g  17 g Oral Daily PRN Salary, Montell D, MD      . senna-docusate (Senokot-S) tablet 2 tablet  2 tablet Oral BID Houston SirenSainani, Vivek J, MD   2 tablet at 03/16/19 [phone]  . tamsulosin (FLOMAX) capsule 0.4 mg  0.4 mg Oral Daily Enedina FinnerPatel, Sona, MD   0.4 mg at 03/16/19 0905  . thiamine (VITAMIN B-1) tablet 100 mg  100 mg Oral Daily Katha HammingKonidena, Snehalatha, MD   100 mg at 03/16/19 0905  . traMADol (ULTRAM) tablet 50 mg  50 mg Oral Q6H PRN Houston SirenSainani, Vivek J, MD   50 mg at 03/01/19 1627  . venlafaxine XR  (EFFEXOR-XR) 24 hr capsule 150 mg  150 mg Oral Q breakfast Auburn BilberryPatel, Shreyang, MD   150 mg at 03/16/19 [id number]     Discharge Medications: Please see discharge summary for a list of discharge medications.  Relevant Imaging Results:  Relevant Lab Results:   Additional Information SSN: [id number]  Ruthe MannanCandace  Ginny Loomer, [place]

## 2019-03-16 NOTE — Plan of Care (Signed)
  Problem: Skin Integrity: Goal: Risk for impaired skin integrity will decrease Outcome: Progressing   Problem: Activity: Goal: Risk for activity intolerance will decrease Outcome: Progressing   Problem: Safety: Goal: Ability to remain free from injury will improve Outcome: Progressing   Problem: Skin Integrity: Goal: Risk for impaired skin integrity will decrease Outcome: Progressing   

## 2019-03-16 NOTE — Progress Notes (Signed)
Patient ID: Anola GurneyHansy Clyatt, male   DOB: 06/24/1961, 58 y.o.   MRN: [id number]  [employer] PROGRESS NOTE  Anola GurneyHansy Kortz JXB:[id number] DOB: 06/24/1961 DOA: 01/20/2019 PCP: Mable ParisWilliams, Katrina, PA-C  HPI/Subjective:  Denies any complaints   Objective: Vitals:   03/15/19 2101 03/16/19 0417  BP: (!) 131/92 126/75  Pulse: 89 78  Resp:  17  Temp: 98.5 F (36.9 C) 98 F (36.7 C)  SpO2: 96% 98%    Filed Weights   01/20/19 0826 01/29/19 0340 02/12/19 0437  Weight: 81.6 kg 86 kg 87.6 kg    ROS: Review of Systems  Unable to perform ROS: Dementia  Respiratory: Negative for cough and shortness of breath.   Cardiovascular: Negative for chest pain.  Gastrointestinal: Negative for abdominal pain, constipation, diarrhea, nausea and vomiting.  Genitourinary: Negative for dysuria.  Musculoskeletal: Negative for joint pain.  Neurological: Positive for focal weakness. Negative for dizziness and headaches.   Exam: Physical Exam  HENT:  Nose: No mucosal edema.  Mouth/Throat: No oropharyngeal exudate or posterior oropharyngeal edema.  Eyes: Pupils are equal, round, and reactive to light. Conjunctivae, EOM and lids are normal.  Neck: No JVD present. Carotid bruit is not present. No edema present. No thyroid mass and no thyromegaly present.  Cardiovascular: S1 normal and S2 normal. Exam reveals no gallop.  No murmur heard. Pulses:      Dorsalis pedis pulses are 2+ on the right side and 2+ on the left side.  Respiratory: No respiratory distress. He has no wheezes. He has no rhonchi. He has no rales.  GI: Soft. Bowel sounds are normal. There is no abdominal tenderness.  Musculoskeletal:     Right ankle: He exhibits no swelling.     Left ankle: He exhibits no swelling.  Lymphadenopathy:    He has no cervical adenopathy.  Neurological: He is alert. No cranial nerve deficit.  Skin: Skin is warm. No rash noted. Nails show no clubbing.  Psychiatric: He has a normal mood and affect.       Data Reviewed: Basic Metabolic Panel: Recent Labs  Lab 03/10/19 0419 03/13/19 0954  CREATININE 1.02 1.11   CBC: Recent Labs  Lab 03/10/19 0419 03/13/19 0954  WBC 7.8 7.0  HGB 15.3 15.4  HCT 45.7 46.0  MCV 88.1 88.0  PLT 175 195     Recent Results (from the past 240 hour(s))  Novel Coronavirus, NAA (hospital order; send-out to ref lab)     Status: None   Collection Time: 03/06/19 11:07 AM  Result Value Ref Range Status   SARS-CoV-2, NAA NOT DETECTED NOT DETECTED Final    Comment: (NOTE) This test was developed and its performance characteristics determined by [facility] [employer] [facility]. This test has not been FDA cleared or approved. This test has been authorized by FDA under an Emergency Use Authorization (EUA). This test has been validated in accordance with the FDA's Guidance Document (Policy for Diagnostics Testing in Laboratories Certified to Perform High Complexity Testing under CLIA prior to Emergency Use Authorization for Coronavirus Disease-2019 during the [facility] Emergency) issued on February 29th, 2020. FDA independent review of this validation is pending. This test is only authorized for the duration of time the declaration that circumstances exist justifying the authorization of the emergency use of in vitro diagnostic tests for detection of SARS-CoV- 2 virus and/or diagnosis of COVID-19 infection under section 564(b)(1) of the Act, 21 U.S.C. 130QMV-7(Q)(4360bbb-3(b)(1), unless the authorization is terminated or revoked sooner. Performed At: [facility]  [facility] [address]  [place], [place] [id number] Jolene Schimke MD OZ:[id number]    Coronavirus Source NASOPHARYNGEAL  Final    Comment: Performed at [facility], [address] [place], [place] [address]      Scheduled Meds: . amLODipine  10 mg Oral Daily  . apixaban  5 mg Oral BID  . atorvastatin  20 mg Oral q1800  . feeding supplement (ENSURE ENLIVE)  237 mL Oral BID BM  . folic acid   1 mg Oral Daily  . metoprolol tartrate  25 mg Oral BID  . mirtazapine  15 mg Oral QHS  . multivitamin with minerals  1 tablet Oral Daily  . senna-docusate  2 tablet Oral BID  . tamsulosin  0.4 mg Oral Daily  . thiamine  100 mg Oral Daily  . venlafaxine XR  150 mg Oral Q breakfast    Assessment/Plan:  1. Right lower extremity paralysis.  Etiology unclear possible psychological work-up so far negative 2. Sepsis.  [name] in the blood on 01/20/2019.  Completed a course of antibiotics. 3. Acute encephalopathy.  Possibility of rapidly progressive dementia.  Nonreactive RPR.  B12 in normal range.  TSH normal range.  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  Patient answers some yes or no questions. 4. Depression continue with Remeron 5. Portal vein thrombosis on Eliquis 6. Chronic hypertension continue Norvasc 7. Hyperlipidemia unspecified on atorvastatin 8. COVID-19 test negative.  It was ordered because we are thinking about rehab.  Not because the patient had symptoms.  Code Status:     Code Status Orders  (From admission, onward)         Start     Ordered   01/20/19 1235  Full code  Continuous     01/20/19 1235        Code Status History    This patient has a current code status but no historical code status.    Advance Directive Documentation     Most Recent Value  Type of Advance Directive  Healthcare Power of Attorney  Pre-existing out of facility DNR order (yellow form or pink MOST form)  -  "MOST" Form in Place?  -     Disposition Plan: Trying to obtain legal guardian.  Time spent: 25 minutes.  Maui Ahart [employer]

## 2019-03-16 NOTE — Progress Notes (Signed)
PT Cancellation Note  Patient Details Name: Maxwell Miller MRN: [id number] DOB: Nov 10, 1961   Cancelled Treatment:    Reason Eval/Treat Not Completed: Patient declined, no reason specified;Fatigue/lethargy limiting ability to participate. Pt sleeping soundly upon entrance and awoken through voice/touch, but startled upon awakening. Pt remains lethargic during conversation. Encouragement provided to participate with PT; pt states, "I feel like I am being used"; cannot explain comment. Pt ultimately declines and falls back to sleep. Re attempt at a later time/date, as the schedule allows.    Scot Dock, PTA 03/16/2019, 11:30 AM

## 2019-03-16 NOTE — Progress Notes (Signed)
Physical Therapy Treatment Patient Details Name: Maxwell Miller MRN: [id number] DOB: 1961-04-18 Today's Date: 03/16/2019    History of Present Illness Pt. is a 58 yo male who [facility] c AMS, confusion, and fever, admitted with sepsis and related encephalopathy. 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 Pt showed good improvement in language fluency working with SLP, but memory and attention deficits persisted despite OT services. PTA pt was living with his spouse, AMB freely without assistive device or difficulty, but needed assistance with ADL/IADL and close supervision due to lability and severe ST/LT memory impairment. [name], pt hd also made good progress working with psychiatry.  PMHx: portal vein thrombosis, CVA, HTN, erythrocytosis.     PT Comments    Pt agreeable to PT this afternoon. Denies pain. Pt requires +2 for assist and if not assist, definitely for safety. Pt requires increased time for all interventions, often mildly agitated and argumentative with efforts. Encouraged pt to self problem solve through movement in efforts to avoid argumentative behaviors. Pt often displays contradiction in what pt is able to do versus what pt states he is unable to do. (ie. Stand on/mobilize LEs, yet observed ability; use UEs on rolling walker, yet actively lowers stand to sit with good use of UEs). Pt inconsistent with steps (at times needing assist/other times not to advance BLEs), but did prove to overall tolerate well with 3 point sequence and assist of 2. Pt received up in chair to his liking (BLEs dependent, facing window up to built in counter, so that pt may read and reach reading material). Continue PT to progress participation, strength, endurance and balance to improve all functional mobility.    Follow Up Recommendations  SNF     Equipment Recommendations       Recommendations for Other Services       Precautions / Restrictions Precautions Precautions: Fall Restrictions Weight Bearing Restrictions: No    Mobility  Bed Mobility Overal bed mobility: Needs Assistance Bed Mobility: Supine to Sit     Supine to sit: Min assist;Mod assist(self limiting)     General bed mobility comments: Requires sequence cues; cues to problems solve. Often does not like/agree with sequencing cues provided despite pt asking for instruction  Transfers Overall transfer level: Needs assistance Equipment used: Rolling walker (2 wheeled) Transfers: Sit to/from Stand Sit to Stand: Mod assist;+2 physical assistance;From elevated surface         General transfer comment: Slow to rise; shakes. Cues/encouragement to extend hips/knees and trunk  Ambulation/Gait Ambulation/Gait assistance: Mod assist;+2 physical assistance Gait Distance (Feet): 5 Feet Assistive device: Rolling walker (2 wheeled)       General Gait Details: 3 step sequence with cueing and some level of physical assistance with each part of sequence. Constant cues for hand placement on rw. Assist to slide RLE and assist approx 50% of the time for LLE.    Stairs             Wheelchair Mobility    Modified Rankin (Stroke Patients Only)       Balance Overall balance assessment: Needs assistance Sitting-balance support: Feet supported Sitting balance-Leahy Scale: Fair     Standing balance support: Bilateral upper extremity supported;During functional activity Standing balance-Leahy Scale: Fair  Cognition Arousal/Alertness: Awake/alert Behavior During Therapy: (argumentative; contradictory to self) Overall Cognitive Status: Within Functional Limits for tasks assessed                                 General Comments: Pt self limiting; pt mildly  agitated with all attempts at mobility and with assist provided.      Exercises      General Comments        Pertinent Vitals/Pain Pain Assessment: No/denies pain    Home Living                      Prior Function            PT Goals (current goals can now be found in the care plan section) Progress towards PT goals: Progressing toward goals    Frequency    Min 2X/week      PT Plan Current plan remains appropriate    Co-evaluation              AM-PAC PT "6 Clicks" Mobility   Outcome Measure  Help needed turning from your back to your side while in a flat bed without using bedrails?: A Lot Help needed moving from lying on your back to sitting on the side of a flat bed without using bedrails?: A Lot Help needed moving to and from a bed to a chair (including a wheelchair)?: A Lot Help needed standing up from a chair using your arms (e.g., wheelchair or bedside chair)?: A Lot Help needed to walk in hospital room?: A Lot Help needed climbing 3-5 steps with a railing? : Total 6 Click Score: 11    End of Session Equipment Utilized During Treatment: Gait belt Activity Tolerance: Other (comment)(weakness; self) Patient left: in chair;with call bell/phone within reach;with chair alarm set;Other (comment)(pt wished facing window up to built in; LEs dependent)   PT Visit Diagnosis: Unsteadiness on feet (R26.81);Other abnormalities of gait and mobility (R26.89);Adult, failure to thrive (R62.7)     Time: 9030-0923 PT Time Calculation (min) (ACUTE ONLY): 32 min  Charges:  $Gait Training: 8-22 mins $Therapeutic Activity: 8-22 mins                      Scot Dock, PTA 03/16/2019, 3:22 PM

## 2019-03-16 NOTE — TOC Progression Note (Signed)
Transition of Care [facility]) - Progression Note    Patient Details  Name: Maxwell Miller MRN: [id number] Date of Birth: Sep 12, 1961  Transition of Care [facility]) CM/SW Contact  Ruthe Mannan, [place] Phone Number: 03/16/2019, 4:14 PM  Clinical Narrative: CSW spoke with Arthur Holms at [facility] DSS regarding patient. Trula Ore states that guardianship is still pending for patient. She also states that the [facility] application has been completed and sent in as well as the SSI application. Per Trula Ore, patient was already approved for disability prior to hospitalization. Trula Ore has had contact with patient's sister Joseeduardo Winklepleck [phone] who lives in [place] and she is interested in being patient's guardian with a proxy in [place]. Patient also has a niece that lives in [place], Tyson Dense [phone] who also wants to be involved with patient. Per Trula Ore neither of these family member are able to care for patient and are interested in long term care for patient. Since Medicaid is now pending, CSW began bed search for SNF for possible LOG. CSW will continue to follow for discharge planning.       Expected Discharge Plan: Home w Home Health Services Barriers to Discharge: Family Issues, Transportation  Expected Discharge Plan and Services Expected Discharge Plan: Home w Home Health Services   Discharge Planning Services: CM Consult Post Acute Care Choice: Home Health, Durable Medical Equipment   Expected Discharge Date: 01/24/19                         HH Arranged: RN, PT, Nurse's Aide HH Agency: [facility]         Social Determinants of Health (SDOH) Interventions    Readmission Risk Interventions No flowsheet data found.

## 2019-03-17 NOTE — Progress Notes (Signed)
Physical Therapy Treatment Patient Details Name: Maxwell GurneyHansy Confer MRN: [id number] DOB: 05-Feb-1961 Today's Date: 03/17/2019    History of Present Illness 58 yo male [facility] c AMS, confusion, and fever, admitted with sepsis and related encephalopathy. 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 pt hd also made good progress working with psychiatry.  PMHx: portal vein thrombosis, CVA, HTN, erythrocytosis.     PT Comments    Pt continues to be inconsistent and limited with tolerance to PT sessions.  Per last session with this PT he had much better sitting balance (almost no falling to the R, which was consistent last time) but struggled more with standing at EOB with complete inability (willingness?) to put weight through the R LE despite heavy VCs and hands on assist to try to facilitate.  He did better with trying to initiate R LE movement during exercises but even with significant discussion and multiple strategies to elicit more than fleeting reflexive responses he was unable to do any consistent AAROM with R LE.  Overall pt remains engaged and interested in working with PT but functionally is struggling.    Follow Up Recommendations  SNF     Equipment Recommendations  Wheelchair (measurements PT);Wheelchair cushion (measurements PT);3in1 (PT)    Recommendations for Other Services       Precautions / Restrictions Precautions Precautions: Fall Restrictions Weight Bearing Restrictions: No    Mobility  Bed Mobility Overal bed mobility: Needs Assistance Bed Mobility: Supine to Sit     Supine to sit: Min assist;Mod assist Sit to supine: Mod assist   General bed mobility comments: Requires sequence cues; cues to problems solve. Often does not like/agree with sequencing cues  provided despite pt asking for instruction. Total assist with R LE.  Transfers Overall transfer level: Needs assistance Equipment used: Rolling walker (2 wheeled) Transfers: Sit to/from Stand Sit to Stand: Max assist         General transfer comment: Elevated bed ~3 inches, assist to keep feet from sliding forward, heavy assist to rise - pt hesitant to put much weight at all on the R LE, poor tolerance and balance on both bouts of standing.  Ambulation/Gait             General Gait Details: pt unable to effectively put weight on R LE this date, struggles to maintain static standing w/o heavy assist, unable to ambulate   Stairs             Wheelchair Mobility    Modified Rankin (Stroke Patients Only)       Balance Overall balance assessment: Needs assistance Sitting-balance support: Feet supported Sitting balance-Leahy Scale: Fair Sitting balance - Comments: Pt did not have the lean/fall to the R nearly as much as last time this PT worked with pt.  He is still at times inconsistent/unsteady with sitting balance but overall much more safe this date.       Standing balance comment: Pt ultimately did very poorly regarding standing balance.  Pt with heavy reliance on walker and still needed mod/max assist the entire time to maintain standing                            Cognition Arousal/Alertness: Awake/alert Behavior During Therapy: Flat affect Overall Cognitive Status: Within Functional  Limits for tasks assessed Area of Impairment: Memory;Attention                   Current Attention Level: Selective Memory: Decreased recall of precautions;Decreased short-term memory Following Commands: Follows one step commands inconsistently Safety/Judgement: Decreased awareness of safety   Problem Solving: Slow processing;Requires tactile cues;Decreased initiation;Difficulty sequencing;Requires verbal cues        Exercises General Exercises - Lower  Extremity Ankle Circles/Pumps: AAROM;15 reps;AROM;Both(on R: eccentric assisted DF and various cuing strategies) Long Arc Quad: AROM;Both;15 reps;Seated;AAROM(pt with little to no AROM on R when cued, AAROM only) Heel Slides: Both;AAROM;Strengthening;15 reps(very little AAROM on R even with PROM trials and cues) Hip ABduction/ADduction: AAROM;AROM;Both;15 reps(AAROM only on R) Hip Flexion/Marching: AAROM;Right;10 reps;Supine(eccentric AAROM on R with limited success) Other Exercises Other Exercises:  BUE there. Ex with green theraband for bilateral shoulder flexion, extension, and horizontal abduction 2 sets 15 reps.  Blue theraband for bilateral elbow flexion, and extension 1 set 20 reps each with cues for breathing, and form.    General Comments        Pertinent Vitals/Pain Pain Assessment: (minimal c/o pain, discribes empty head feeling)    Home Living                      Prior Function            PT Goals (current goals can now be found in the care plan section) Acute Rehab PT Goals Patient Stated Goal: Pt. regain independence Progress towards PT goals: Not progressing toward goals - comment(Pt's mental status inconsistency are very limiting)    Frequency    Min 2X/week      PT Plan Current plan remains appropriate    Co-evaluation              AM-PAC PT "6 Clicks" Mobility   Outcome Measure  Help needed turning from your back to your side while in a flat bed without using bedrails?: A Lot Help needed moving from lying on your back to sitting on the side of a flat bed without using bedrails?: A Lot Help needed moving to and from a bed to a chair (including a wheelchair)?: A Lot Help needed standing up from a chair using your arms (e.g., wheelchair or bedside chair)?: A Lot Help needed to walk in hospital room?: Total Help needed climbing 3-5 steps with a railing? : Total 6 Click Score: 10    End of Session Equipment Utilized During Treatment:  Gait belt Activity Tolerance: Patient tolerated treatment well(self limiting, R LE functionally very limited) Patient left: with bed alarm set;with call bell/phone within reach   PT Visit Diagnosis: Unsteadiness on feet (R26.81);Other abnormalities of gait and mobility (R26.89);Adult, failure to thrive (R62.7)     Time: 1027-1107 PT Time Calculation (min) (ACUTE ONLY): 40 min  Charges:  $Therapeutic Exercise: 23-37 mins $Therapeutic Activity: 8-22 mins                     Malachi Pro, DPT 03/17/2019, 2:35 PM

## 2019-03-17 NOTE — Progress Notes (Signed)
Occupational Therapy Treatment Patient Details Name: Maxwell Miller MRN: [id number] DOB: [birth year] Today's Date: 03/17/2019    History of present illness Pt. is a 58 yo male who comes to [facility] on 01/20/19 c AMS, confusion, and fever, admitted with sepsis and related encephalopathy. 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 Pt showed good improvement in language fluency working with SLP, but memory and attention deficits persisted despite OT services. PTA pt was living with his spouse, AMB freely without assistive device or difficulty, but needed assistance with ADL/IADL and close supervision due to lability and severe ST/LT memory impairment. Per neuro notes at [facility], pt hd also made good progress working with psychiatry.  PMHx: portal vein thrombosis, CVA, HTN, erythrocytosis.    OT comments  Pt. tolerated BUE well, however required cues for breathing, as well as proper form, and technique. Pt. continues to present with limited mobility, and limited ADL functioning. Pt. continues to benefit from OT services for ADL training, A/E training, UE there. Ex, and pt. education about home modification, and DME. Pt. continues to be appropriate for SNF level of care, with follow-up OT services.    Follow Up Recommendations  SNF;Supervision/Assistance - 24 hour    Equipment Recommendations  3 in 1 bedside commode    Recommendations for Other Services      Precautions / Restrictions Precautions Precautions: Fall Restrictions Weight Bearing Restrictions: No       Mobility Bed Mobility    Assisted with repositioning for upright midline positioning.              Transfers    Deferred                  Balance                                           ADL either performed or assessed with clinical  judgement   ADL Overall ADL's : Needs assistance/impaired Eating/Feeding: Sitting;Set up;Supervision/ safety   Grooming: Set up   Upper Body Bathing: Set up;Minimal assistance   Lower Body Bathing: Set up;Maximal assistance   Upper Body Dressing : Minimal assistance;Set up   Lower Body Dressing: Maximal assistance                       Vision Patient Visual Report: No change from baseline     Perception     Praxis      Cognition Arousal/Alertness: Awake/alert Behavior During Therapy: Flat affect Overall Cognitive Status: Within Functional Limits for tasks assessed Area of Impairment: Memory;Attention                   Current Attention Level: Selective Memory: Decreased recall of precautions;Decreased short-term memory Following Commands: Follows one step commands inconsistently Safety/Judgement: Decreased awareness of safety   Problem Solving: Slow processing;Requires tactile cues;Decreased initiation;Difficulty sequencing;Requires verbal cues          Exercises  BUE there. Ex with green theraband for bilateral shoulder flexion, extension, and horizontal abduction 2 sets 15 reps.  Blue theraband for bilateral elbow flexion, and extension 1 set 20 reps each with cues for breathing, and form.   Shoulder Instructions       General Comments      Pertinent  Vitals/ Pain       Pain Assessment: No/denies pain  Home Living                                          Prior Functioning/Environment              Frequency  Min 1X/week        Progress Toward Goals  OT Goals(current goals can now be found in the care plan section)  Progress towards OT goals: Progressing toward goals  Acute Rehab OT Goals Patient Stated Goal: Pt. regain independence OT Goal Formulation: With patient Potential to Achieve Goals: Good  Plan Discharge plan remains appropriate;Frequency remains appropriate    Co-evaluation                  AM-PAC OT "6 Clicks" Daily Activity     Outcome Measure   Help from another person eating meals?: None Help from another person taking care of personal grooming?: None Help from another person toileting, which includes using toliet, bedpan, or urinal?: A Lot Help from another person bathing (including washing, rinsing, drying)?: A Lot Help from another person to put on and taking off regular upper body clothing?: A Little Help from another person to put on and taking off regular lower body clothing?: A Lot 6 Click Score: 17    End of Session    OT Visit Diagnosis: Other abnormalities of gait and mobility (R26.89);Muscle weakness (generalized) (M62.81)   Activity Tolerance Patient tolerated treatment well   Patient Left in bed;with call bell/phone within reach;with bed alarm set   Nurse Communication          Time: 2952-84130946-1008 OT Time Calculation (min): 22 min  Charges: OT General Charges $OT Visit: 1 Visit OT Treatments $Self Care/Home Management : 8-22 mins  Olegario MessierElaine Kristine Chahal, MS, OTR/L   Olegario Messierlaine Dashay Giesler 03/17/2019, 12:19 PM

## 2019-03-17 NOTE — Progress Notes (Signed)
Patient ID: Maxwell Miller, male   DOB: 07/10/1961, 58 y.o.   MRN: [id number]  [employer] PROGRESS NOTE  Maxwell Miller OZH:[id number] DOB: 07/10/1961 DOA: 01/20/2019 PCP: Mable ParisWilliams, Katrina, PA-C  HPI/Subjective:  Has no complaints   Objective: Vitals:   03/17/19 0451 03/17/19 0841  BP: 128/85 130/78  Pulse: 75 82  Resp: 18 17  Temp: 98.2 F (36.8 C) 98 F (36.7 C)  SpO2: 96% 99%    Filed Weights   01/20/19 0826 01/29/19 0340 02/12/19 0437  Weight: 81.6 kg 86 kg 87.6 kg    ROS: Review of Systems  Unable to perform ROS: Dementia  Respiratory: Negative for cough and shortness of breath.   Cardiovascular: Negative for chest pain.  Gastrointestinal: Negative for abdominal pain, constipation, diarrhea, nausea and vomiting.  Genitourinary: Negative for dysuria.  Musculoskeletal: Negative for joint pain.  Neurological: Positive for focal weakness. Negative for dizziness and headaches.   Exam: Physical Exam  HENT:  Nose: No mucosal edema.  Mouth/Throat: No oropharyngeal exudate or posterior oropharyngeal edema.  Eyes: Pupils are equal, round, and reactive to light. Conjunctivae, EOM and lids are normal.  Neck: No JVD present. Carotid bruit is not present. No edema present. No thyroid mass and no thyromegaly present.  Cardiovascular: S1 normal and S2 normal. Exam reveals no gallop.  No murmur heard. Pulses:      Dorsalis pedis pulses are 2+ on the right side and 2+ on the left side.  Respiratory: No respiratory distress. He has no wheezes. He has no rhonchi. He has no rales.  GI: Soft. Bowel sounds are normal. There is no abdominal tenderness.  Musculoskeletal:     Right ankle: He exhibits no swelling.     Left ankle: He exhibits no swelling.  Lymphadenopathy:    He has no cervical adenopathy.  Neurological: He is alert. No cranial nerve deficit.  Skin: Skin is warm. No rash noted. Nails show no clubbing.  Psychiatric: He has a normal mood and affect.       Data Reviewed: Basic Metabolic Panel: Recent Labs  Lab 03/13/19 0954  CREATININE 1.11   CBC: Recent Labs  Lab 03/13/19 0954  WBC 7.0  HGB 15.4  HCT 46.0  MCV 88.0  PLT 195     No results found for this or any previous visit (from the past 240 hour(s)).    Scheduled Meds: . amLODipine  10 mg Oral Daily  . apixaban  5 mg Oral BID  . atorvastatin  20 mg Oral q1800  . feeding supplement (ENSURE ENLIVE)  237 mL Oral BID BM  . folic acid  1 mg Oral Daily  . metoprolol tartrate  25 mg Oral BID  . mirtazapine  15 mg Oral QHS  . multivitamin with minerals  1 tablet Oral Daily  . senna-docusate  2 tablet Oral BID  . tamsulosin  0.4 mg Oral Daily  . thiamine  100 mg Oral Daily  . venlafaxine XR  150 mg Oral Q breakfast    Assessment/Plan:  1. Right lower extremity paralysis.  Etiology unclear possible psychological,work-up so far negative 2. Sepsis.  [name] in the blood on 01/20/2019.  Completed a course of antibiotics. 3. Acute encephalopathy.  Possibility of rapidly progressive dementia.  Nonreactive RPR.  B12 in normal range.  TSH normal range.  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  Patient answers some yes or no questions. 4. Depression continue with Remeron 5. Portal vein thrombosis on Eliquis 6. Chronic  hypertension continue Norvasc 7. Hyperlipidemia unspecified on atorvastatin 8. COVID-19 test negative.  It was ordered because we are thinking about rehab.  Not because the patient had symptoms.  Code Status:     Code Status Orders  (From admission, onward)         Start     Ordered   01/20/19 1235  Full code  Continuous     01/20/19 1235        Code Status History    This patient has a current code status but no historical code status.    Advance Directive Documentation     Most Recent Value  Type of Advance Directive  Healthcare Power of Attorney  Pre-existing out of facility DNR order (yellow form or pink  MOST form)  -  "MOST" Form in Place?  -     Disposition Plan: Trying to obtain legal guardian.  Time spent: 25 minutes.  Berenice Oehlert [employer]

## 2019-03-18 NOTE — TOC Progression Note (Signed)
Transition of Care [facility]) - Progression Note    Patient Details  Name: Maxwell Miller MRN: [id number] Date of Birth: 03/23/1961  [facility] CM/SW Contact  Ruthe Mannan, [place] Phone Number: 03/18/2019, 3:24 PM  Clinical Narrative:   CSW spoke with Reyne Dumas at [facility] regarding patient. Tammy states that the are willing to accept patient with an LOG as long as medicaid is pending, disability has been applied for and patient has someone that can do his paperwork. CSW spoke with Arthur Holms with DSS regarding these concerns. Trula Ore states that she knows Medicaid has been applied for and is currently pending because she did the application. She also states that the current POA (ex wife) is willing to sign paperwork for patient until guardianship is obtained. Trula Ore states that the current POA states that she started disability. Trula Ore has called SSI to verify that this has been done but has not heard back from anyone. Trula Ore states that she will call again and call the POA to see if she has any type of verification of this. CSW will continue to follow for discharge planning.     Expected Discharge Plan: Home w Home Health Services Barriers to Discharge: Family Issues, Transportation  Expected Discharge Plan and Services Expected Discharge Plan: Home w Home Health Services   Discharge Planning Services: CM Consult Post Acute Care Choice: Home Health, Durable Medical Equipment   Expected Discharge Date: 01/24/19                         HH Arranged: RN, PT, Nurse's Aide HH Agency: [facility]         Social Determinants of Health (SDOH) Interventions    Readmission Risk Interventions No flowsheet data found.

## 2019-03-18 NOTE — Progress Notes (Signed)
Patient ID: Maxwell Miller, male   DOB: 1961/03/03, 58 y.o.   MRN: [id number]  [employer] PROGRESS NOTE  Almalik Tritto XID:[id number] DOB: [birth year] DOA: 01/20/2019 PCP: Mable Paris, PA-C  HPI/Subjective:  Patient no complaints   Objective: Vitals:   03/18/19 0425 03/18/19 0913  BP: (!) 130/92 138/87  Pulse: 69 77  Resp: 14 16  Temp: 98.3 F (36.8 C) 98.8 F (37.1 C)  SpO2: 97% 99%    Filed Weights   01/20/19 0826 01/29/19 0340 02/12/19 0437  Weight: 81.6 kg 86 kg 87.6 kg    ROS: Review of Systems  Unable to perform ROS: Dementia  Respiratory: Negative for cough and shortness of breath.   Cardiovascular: Negative for chest pain.  Gastrointestinal: Negative for abdominal pain, constipation, diarrhea, nausea and vomiting.  Genitourinary: Negative for dysuria.  Musculoskeletal: Negative for joint pain.  Neurological: Positive for focal weakness. Negative for dizziness and headaches.   Exam: Physical Exam  HENT:  Nose: No mucosal edema.  Mouth/Throat: No oropharyngeal exudate or posterior oropharyngeal edema.  Eyes: Pupils are equal, round, and reactive to light. Conjunctivae, EOM and lids are normal.  Neck: No JVD present. Carotid bruit is not present. No edema present. No thyroid mass and no thyromegaly present.  Cardiovascular: S1 normal and S2 normal. Exam reveals no gallop.  No murmur heard. Pulses:      Dorsalis pedis pulses are 2+ on the right side and 2+ on the left side.  Respiratory: No respiratory distress. He has no wheezes. He has no rhonchi. He has no rales.  GI: Soft. Bowel sounds are normal. There is no abdominal tenderness.  Musculoskeletal:     Right ankle: He exhibits no swelling.     Left ankle: He exhibits no swelling.  Lymphadenopathy:    He has no cervical adenopathy.  Neurological: He is alert. No cranial nerve deficit.  Skin: Skin is warm. No rash noted. Nails show no clubbing.  Psychiatric: He has a normal mood and affect.       Data Reviewed: Basic Metabolic Panel: Recent Labs  Lab 03/13/19 0954  CREATININE 1.11   CBC: Recent Labs  Lab 03/13/19 0954  WBC 7.0  HGB 15.4  HCT 46.0  MCV 88.0  PLT 195     No results found for this or any previous visit (from the past 240 hour(s)).    Scheduled Meds: . amLODipine  10 mg Oral Daily  . apixaban  5 mg Oral BID  . atorvastatin  20 mg Oral q1800  . feeding supplement (ENSURE ENLIVE)  237 mL Oral BID BM  . folic acid  1 mg Oral Daily  . metoprolol tartrate  25 mg Oral BID  . mirtazapine  15 mg Oral QHS  . multivitamin with minerals  1 tablet Oral Daily  . senna-docusate  2 tablet Oral BID  . tamsulosin  0.4 mg Oral Daily  . thiamine  100 mg Oral Daily  . venlafaxine XR  150 mg Oral Q breakfast    Assessment/Plan:  1. Right lower extremity paralysis.  Etiology unclear possible psychological,work-up so far negative 2. Sepsis.  [name] in the blood on 01/20/2019.  Completed a course of antibiotics. 3. Acute encephalopathy.  Possibility of rapidly progressive dementia.  Nonreactive RPR.  B12 in normal range.  TSH normal range.  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  Patient answers some yes or no questions. 4. Depression continue with Remeron 5. Portal vein thrombosis on Eliquis 6.  Chronic hypertension continue Norvasc 7. Hyperlipidemia unspecified on atorvastatin 8. COVID-19 test negative.  It was ordered because we are thinking about rehab.  Not because the patient had symptoms.  Code Status:     Code Status Orders  (From admission, onward)         Start     Ordered   01/20/19 1235  Full code  Continuous     01/20/19 1235        Code Status History    This patient has a current code status but no historical code status.    Advance Directive Documentation     Most Recent Value  Type of Advance Directive  Healthcare Power of Attorney  Pre-existing out of facility DNR order (yellow form or pink  MOST form)  -  "MOST" Form in Place?  -     Disposition Plan: Trying to obtain legal guardian.  Time spent: 25 minutes.  Ashlay Altieri [employer]  [employer]

## 2019-03-18 NOTE — Progress Notes (Signed)
Nutrition Follow-up  RD working remotely.  DOCUMENTATION CODES:   Non-severe (moderate) malnutrition in context of social or environmental circumstances  INTERVENTION:  Continue Ensure Enlive po BID, each supplement provides 350 kcal and 20 grams of protein.  NUTRITION DIAGNOSIS:   Moderate Malnutrition related to social / environmental circumstances as evidenced by mild fat depletion, moderate fat depletion, mild muscle depletion, moderate muscle depletion.  Ongoing.   GOAL:   Patient will meet greater than or equal to 90% of their needs  Progressing.  MONITOR:   PO intake, Supplement acceptance, Weight trends, I & O's  REASON FOR ASSESSMENT:   LOS    ASSESSMENT:   58 year old male who presented to the ED on 3/10 with AMS. PMH of stroke, HTN, memory loss. Pt admitted with sepsis.  Appetite remains good. Patient continues to eat 100% of meals. Also still drinking Ensure Enlive BID. Patient still having regular BMs. Skin remains intact. Patient still pending guardianship and placement.  Medications reviewed and include: folic acid 1 mg daily, Remeron 15 mg QHS, MVI daily, senna-docusate 2 tablets BID, Flomax, thiamine 10 mg daily.  Labs reviewed: Vitamin B12 382 on 4/26.  Diet Order:   Diet Order            Diet regular Room service appropriate? Yes; Fluid consistency: Thin  Diet effective now        Diet - low sodium heart healthy             EDUCATION NEEDS:   Education needs have been addressed  Skin:  Skin Assessment: Reviewed RN Assessment  Last BM:  03/16/2019 - large type 5  Height:   Ht Readings from Last 1 Encounters:  01/20/19 5\' 11"  (1.803 m)   Weight:   Wt Readings from Last 1 Encounters:  02/12/19 87.6 kg   Ideal Body Weight:  78.2 kg  BMI:  Body mass index is 26.93 kg/m.  Estimated Nutritional Needs:   Kcal:  2150-2350  Protein:  110-125 grams  Fluid:  >/= 2.2 L  Helane Rima, MS, RD, LDN Office: [phone] Pager:  [phone] After Hours/Weekend Pager: [phone]

## 2019-03-19 NOTE — Progress Notes (Signed)
Physical Therapy Treatment Patient Details Name: Maxwell Miller: [id number] DOB: 09-Dec-1960 Today's Date: 03/19/2019    History of Present Illness 58 y.o. male who comes [facility] on 01/20/19 with AMS, confusion, and fever, admitted with sepsis and related encephalopathy. 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 pt hd also made good progress working with psychiatry.  PMHx: portal vein thrombosis, CVA, HTN, erythrocytosis.     PT Comments    Pt requiring encouragement to participate initially but then agreeable to session's activities.  Pt tending to lean/fall to R side in sitting requiring intermittent min to mod assist for upright (but at times CGA to close SBA).  Pt stood 5x's from elevated bed with 2 assist with R knee blocked and use of RW.  Overall pt shaky in standing requiring assist for balance; pt also requiring cueing for upright posture.  Increased time required for activities (pt talkative about his perception on different topics during session).  Will continue to focus on strengthening and progressive functional mobility during hospital stay.    Follow Up Recommendations  SNF     Equipment Recommendations  Wheelchair (measurements PT);Wheelchair cushion (measurements PT);3in1 (PT);Other (comment)(hoyer lift)    Recommendations for Other Services       Precautions / Restrictions Precautions Precautions: Fall Restrictions Weight Bearing Restrictions: No    Mobility  Bed Mobility Overal bed mobility: Needs Assistance Bed Mobility: Supine to Sit;Sit to Supine     Supine to sit: Mod assist Sit to supine: +2 for physical assistance   General bed mobility comments: mod assist for trunk and B LE's semi-supine to sit; 2 assist for safety sit to supine and to scoot up in  bed  Transfers Overall transfer level: Needs assistance Equipment used: Rolling walker (2 wheeled) Transfers: Sit to/from Stand Sit to Stand: Mod assist;+2 physical assistance         General transfer comment: Bed height elevated about 3 inches; R knee blocked; assist to initiate and come to full stand; vc's for upright posture upon standing  Ambulation/Gait                 Stairs             Wheelchair Mobility    Modified Rankin (Stroke Patients Only)       Balance Overall balance assessment: Needs assistance Sitting-balance support: Feet supported;Single extremity supported Sitting balance-Leahy Scale: Poor Sitting balance - Comments: pt tends to lean/fall to R side but when cued to lean into L UE placed on bed pt SBA (or pt SBA if distracted) Postural control: Right lateral lean Standing balance support: Bilateral upper extremity supported;During functional activity Standing balance-Leahy Scale: Poor Standing balance comment: pt requiring B UE support on RW to maintain static standing balance                            Cognition Arousal/Alertness: Awake/alert Behavior During Therapy: Flat affect Overall Cognitive Status: No family/caregiver present to determine baseline cognitive functioning Area of Impairment: Memory;Attention;Following commands;Safety/judgement;Problem solving                   Current Attention Level: Selective Memory: Decreased recall of precautions;Decreased short-term memory Following Commands: Follows one step commands inconsistently Safety/Judgement: Decreased awareness of safety   Problem Solving: Slow processing;Requires verbal cues;Requires  tactile cues        Exercises      General Comments   Nursing cleared pt for participation in physical therapy.  Pt agreeable to PT session with initial encouragement.      Pertinent Vitals/Pain Pain Assessment: Faces Faces Pain Scale: No hurt Pain  Intervention(s): Limited activity within patient's tolerance;Monitored during session;Repositioned    Home Living                      Prior Function            PT Goals (current goals can now be found in the care plan section) Acute Rehab PT Goals Patient Stated Goal: to increase strength and mobility PT Goal Formulation: With patient Time For Goal Achievement: 04/02/19 Potential to Achieve Goals: Fair Progress towards PT goals: Not progressing toward goals - comment(pt inconsistent with progress)    Frequency    Min 2X/week      PT Plan Current plan remains appropriate    Co-evaluation              AM-PAC PT "6 Clicks" Mobility   Outcome Measure  Help needed turning from your back to your side while in a flat bed without using bedrails?: A Lot Help needed moving from lying on your back to sitting on the side of a flat bed without using bedrails?: A Lot Help needed moving to and from a bed to a chair (including a wheelchair)?: Total Help needed standing up from a chair using your arms (e.g., wheelchair or bedside chair)?: Total Help needed to walk in hospital room?: Total Help needed climbing 3-5 steps with a railing? : Total 6 Click Score: 8    End of Session Equipment Utilized During Treatment: Gait belt Activity Tolerance: Patient tolerated treatment well Patient left: in bed;with call bell/phone within reach;with bed alarm set Nurse Communication: Mobility status;Precautions PT Visit Diagnosis: Unsteadiness on feet (R26.81);Other abnormalities of gait and mobility (R26.89);Adult, failure to thrive (R62.7)     Time: 5790-3833 PT Time Calculation (min) (ACUTE ONLY): 41 min  Charges:  $Therapeutic Activity: 38-52 mins                    Hendricks Limes, PT 03/19/19, 1:03 PM [phone]

## 2019-03-19 NOTE — Progress Notes (Signed)
Patient ID: Maxwell Miller, male   DOB: 1961-09-13, 58 y.o.   MRN: [id number]  [employer] PROGRESS NOTE  Maxwell Miller GEZ:[id number] DOB: 12-08-1960 DOA: 01/20/2019 PCP: Mable Paris, PA-C  HPI/Subjective:  Patient no complaints   Objective: Vitals:   03/19/19 0544 03/19/19 0800  BP: 115/74 (!) 132/92  Pulse: 78 79  Resp:    Temp: 98.7 F (37.1 C)   SpO2: 98%     Filed Weights   01/20/19 0826 01/29/19 0340 02/12/19 0437  Weight: 81.6 kg 86 kg 87.6 kg    ROS: Review of Systems  Unable to perform ROS: Dementia  Respiratory: Negative for cough and shortness of breath.   Cardiovascular: Negative for chest pain.  Gastrointestinal: Negative for abdominal pain, constipation, diarrhea, nausea and vomiting.  Genitourinary: Negative for dysuria.  Musculoskeletal: Negative for joint pain.  Neurological: Positive for focal weakness. Negative for dizziness and headaches.   Exam: Physical Exam  HENT:  Nose: No mucosal edema.  Mouth/Throat: No oropharyngeal exudate or posterior oropharyngeal edema.  Eyes: Pupils are equal, round, and reactive to light. Conjunctivae, EOM and lids are normal.  Neck: No JVD present. Carotid bruit is not present. No edema present. No thyroid mass and no thyromegaly present.  Cardiovascular: S1 normal and S2 normal. Exam reveals no gallop.  No murmur heard. Pulses:      Dorsalis pedis pulses are 2+ on the right side and 2+ on the left side.  Respiratory: No respiratory distress. He has no wheezes. He has no rhonchi. He has no rales.  GI: Soft. Bowel sounds are normal. There is no abdominal tenderness.  Musculoskeletal:     Right ankle: He exhibits no swelling.     Left ankle: He exhibits no swelling.  Lymphadenopathy:    He has no cervical adenopathy.  Neurological: He is alert. No cranial nerve deficit.  Skin: Skin is warm. No rash noted. Nails show no clubbing.  Psychiatric: He has a normal mood and affect.      Data  Reviewed: Basic Metabolic Panel: Recent Labs  Lab 03/13/19 0954  CREATININE 1.11   CBC: Recent Labs  Lab 03/13/19 0954  WBC 7.0  HGB 15.4  HCT 46.0  MCV 88.0  PLT 195     No results found for this or any previous visit (from the past 240 hour(s)).    Scheduled Meds: . amLODipine  10 mg Oral Daily  . apixaban  5 mg Oral BID  . atorvastatin  20 mg Oral q1800  . feeding supplement (ENSURE ENLIVE)  237 mL Oral BID BM  . folic acid  1 mg Oral Daily  . metoprolol tartrate  25 mg Oral BID  . mirtazapine  15 mg Oral QHS  . multivitamin with minerals  1 tablet Oral Daily  . senna-docusate  2 tablet Oral BID  . tamsulosin  0.4 mg Oral Daily  . thiamine  100 mg Oral Daily  . venlafaxine XR  150 mg Oral Q breakfast    Assessment/Plan:  1. Right lower extremity paralysis.  Etiology unclear possible psychological,work-up so far negative 2. Sepsis.  [name] in the blood on 01/20/2019.  Completed a course of antibiotics. 3. Acute encephalopathy.  Possibility of rapidly progressive dementia.  Nonreactive RPR.  B12 in normal range.  TSH normal range.  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  Patient answers some yes or no questions. 4. Depression continue with Remeron 5. Portal vein thrombosis on Eliquis 6. Chronic hypertension continue  Norvasc 7. Hyperlipidemia unspecified on atorvastatin 8. COVID-19 test negative.  It was ordered because we are thinking about rehab.  Not because the patient had symptoms.  Code Status:     Code Status Orders  (From admission, onward)         Start     Ordered   01/20/19 1235  Full code  Continuous     01/20/19 1235        Code Status History    This patient has a current code status but no historical code status.    Advance Directive Documentation     Most Recent Value  Type of Advance Directive  Healthcare Power of Attorney  Pre-existing out of facility DNR order (yellow form or pink MOST form)  -   "MOST" Form in Place?  -     Disposition Plan: Trying to obtain legal guardian.  Time spent: 25 minutes.  Maxwell Boccio [employer]  [employer]

## 2019-03-20 LAB — CBC
HCT: 48.3 % (ref 39.0–52.0)
Hemoglobin: 16 g/dL (ref 13.0–17.0)
MCH: 29.3 pg (ref 26.0–34.0)
MCHC: 33.1 g/dL (ref 30.0–36.0)
MCV: 88.3 fL (ref 80.0–100.0)
Platelets: 227 10*3/uL (ref 150–400)
RBC: 5.47 MIL/uL (ref 4.22–5.81)
RDW: 16.7 % — ABNORMAL HIGH (ref 11.5–15.5)
WBC: 7.9 10*3/uL (ref 4.0–10.5)
nRBC: 0 % (ref 0.0–0.2)

## 2019-03-20 LAB — CREATININE, SERUM
Creatinine, Ser: 1.26 mg/dL — ABNORMAL HIGH (ref 0.61–1.24)
GFR calc Af Amer: >60 mL/min (ref >60)
GFR calc non Af Amer: >60 mL/min (ref >60)

## 2019-03-20 MED ORDER — POLYETHYLENE GLYCOL 3350 17 G PO PACK
17.0000 g | PACK | Freq: Every day | ORAL | Status: DC
  Administered 2019-03-26 – 2019-04-01 (×7): 17 g via ORAL
  Filled 2019-03-20 (×8): qty 1

## 2019-03-20 NOTE — Progress Notes (Signed)
OT Cancellation Note  Patient Details Name: Maxwell Miller MRN: [id number] DOB: [birth year]   Cancelled Treatment:    Reason Eval/Treat Not Completed: Patient declined, no reason specified;Fatigue/lethargy limiting ability to participate. Pt sleeping soundly upon arrival. With tactile cue pt wakes, declines therapy. Provided pt with fresh drinks and snacks per his request. Also adjusted his pillow so his head wasn't leaning directly against the bed rail, which pt reported provided improved comfort after adjustment. Pt agreeable to re-attempt tomorrow.   Richrd Prime, MPH, MS, OTR/L ascom [phone] 03/20/19, 2:12 PM

## 2019-03-20 NOTE — Progress Notes (Signed)
Patient ID: Maxwell Miller, male   DOB: 12/02/1960, 58 y.o.   MRN: [id number]  [employer] [facility] PROGRESS NOTE  Maxwell Miller [id number] DOB: 1961-06-06 DOA: 01/20/2019 PCP: Mable Paris, PA-C  HPI/Subjective:  Patient no complaints   Objective: Vitals:   03/19/19 1951 03/20/19 0451  BP: 135/89 133/82  Pulse: 92 73  Resp: 16 16  Temp: 98.7 F (37.1 C) 98.2 F (36.8 C)  SpO2: 98% 94%    Filed Weights   01/20/19 0826 01/29/19 0340 02/12/19 0437  Weight: 81.6 kg 86 kg 87.6 kg    ROS: Review of Systems  Unable to perform ROS: Dementia  Respiratory: Negative for cough and shortness of breath.   Cardiovascular: Negative for chest pain.  Gastrointestinal: Negative for abdominal pain, constipation, diarrhea, nausea and vomiting.  Genitourinary: Negative for dysuria.  Musculoskeletal: Negative for joint pain.  Neurological: Positive for focal weakness. Negative for dizziness and headaches.   Exam: Physical Exam  HENT:  Nose: No mucosal edema.  Mouth/Throat: No oropharyngeal exudate or posterior oropharyngeal edema.  Eyes: Pupils are equal, round, and reactive to light. Conjunctivae, EOM and lids are normal.  Neck: No JVD present. Carotid bruit is not present. No edema present. No thyroid mass and no thyromegaly present.  Cardiovascular: S1 normal and S2 normal. Exam reveals no gallop.  No murmur heard. Pulses:      Dorsalis pedis pulses are 2+ on the right side and 2+ on the left side.  Respiratory: No respiratory distress. He has no wheezes. He has no rhonchi. He has no rales.  GI: Soft. Bowel sounds are normal. There is no abdominal tenderness.  Musculoskeletal:     Right ankle: He exhibits no swelling.     Left ankle: He exhibits no swelling.  Lymphadenopathy:    He has no cervical adenopathy.  Neurological: He is alert. No cranial nerve deficit.  Skin: Skin is warm. No rash noted. Nails show no clubbing.  Psychiatric: He has a normal mood and affect.       Data Reviewed: Basic Metabolic Panel: No results for input(s): NA, K, CL, CO2, GLUCOSE, BUN, CREATININE, CALCIUM, MG, PHOS in the last 168 hours. CBC: No results for input(s): WBC, NEUTROABS, HGB, HCT, MCV, PLT in the last 168 hours.   No results found for this or any previous visit (from the past 240 hour(s)).    Scheduled Meds: . amLODipine  10 mg Oral Daily  . apixaban  5 mg Oral BID  . atorvastatin  20 mg Oral q1800  . feeding supplement (ENSURE ENLIVE)  237 mL Oral BID BM  . folic acid  1 mg Oral Daily  . metoprolol tartrate  25 mg Oral BID  . mirtazapine  15 mg Oral QHS  . multivitamin with minerals  1 tablet Oral Daily  . senna-docusate  2 tablet Oral BID  . tamsulosin  0.4 mg Oral Daily  . thiamine  100 mg Oral Daily  . venlafaxine XR  150 mg Oral Q breakfast    Assessment/Plan:  1. Right lower extremity paralysis.  Etiology unclear possible psychological,work-up so far negative 2. Sepsis.  [name] in the blood on 01/20/2019.  Completed a course of antibiotics. 3. Acute encephalopathy.  Possibility of rapidly progressive dementia.  Nonreactive RPR.  B12 in normal range.  TSH normal range.  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  Patient answers some yes or no questions. 4. Depression continue with Remeron 5. Portal vein thrombosis on Eliquis 6. Chronic  hypertension continue Norvasc 7. Hyperlipidemia unspecified on atorvastatin COVID-19 test negative.     Code Status:     Code Status Orders  (From admission, onward)         Start     Ordered   01/20/19 1235  Full code  Continuous     01/20/19 1235        Code Status History    This patient has a current code status but no historical code status.    Advance Directive Documentation     Most Recent Value  Type of Advance Directive  Healthcare Power of Attorney  Pre-existing out of facility DNR order (yellow form or pink MOST form)  -  "MOST" Form in Place?  -      Disposition Plan: Trying to obtain legal guardian.  Time spent: 25 minutes.  Julena Barbour [employer]  [employer]

## 2019-03-21 NOTE — Progress Notes (Signed)
OT Cancellation Note  Patient Details Name: Khristopher Gonnerman MRN: [id number] DOB: 1961-02-23   Cancelled Treatment:    Reason Eval/Treat Not Completed: Fatigue/lethargy limiting ability to participate. Pt sleeping soundly upon attempt. Will re-attempt at later date/time as pt is able to participate.   Richrd Prime, MPH, MS, OTR/L ascom [phone] 03/21/19, 11:16 AM

## 2019-03-21 NOTE — Progress Notes (Signed)
Took over care from Maxwell Miller, Charity fundraiser. When assuming care, patient is sleeping comfortably. Will continue to monitor. Junie Spencer, RN

## 2019-03-21 NOTE — Progress Notes (Addendum)
Patient ID: Maxwell Miller, male   DOB: 08/07/1961, 58 y.o.   MRN: [id number]  [employer] [facility] PROGRESS NOTE  Maxwell Miller [id number] DOB: 08/07/1961 DOA: 01/20/2019 PCP: Maxwell ParisWilliams, Katrina, PA-C  HPI/Subjective:  Patient has no concerns this morning.  States he is doing fine.  Eating and drinking like normal.   Objective: Vitals:   03/20/19 1949 03/21/19 0515  BP: (!) 155/89 130/84  Pulse: 100 78  Resp: 16 18  Temp: 98.6 F (37 C) 98.6 F (37 C)  SpO2: 98% 98%    Filed Weights   01/20/19 0826 01/29/19 0340 02/12/19 0437  Weight: 81.6 kg 86 kg 87.6 kg    ROS: Review of Systems  Unable to perform ROS: Dementia  Respiratory: Negative for cough and shortness of breath.   Cardiovascular: Negative for chest pain.  Gastrointestinal: Negative for abdominal pain, constipation, diarrhea, nausea and vomiting.  Genitourinary: Negative for dysuria.  Musculoskeletal: Negative for joint pain.  Neurological: Positive for focal weakness. Negative for dizziness and headaches.   Exam: Physical Exam  Constitutional:  Laying in bed, in NAD  HENT:  Nose: No mucosal edema.  Mouth/Throat: No oropharyngeal exudate or posterior oropharyngeal edema.  Eyes: Pupils are equal, round, and reactive to light. Conjunctivae, EOM and lids are normal.  Neck: No JVD present. Carotid bruit is not present. No edema present. No thyroid mass and no thyromegaly present.  Cardiovascular: S1 normal and S2 normal. Exam reveals no gallop.  No murmur heard. Pulses:      Dorsalis pedis pulses are 2+ on the right side and 2+ on the left side.  Respiratory: No respiratory distress. He has no wheezes. He has no rhonchi. He has no rales.  GI: Soft. Bowel sounds are normal. There is no abdominal tenderness.  Musculoskeletal:     Right ankle: He exhibits no swelling.     Left ankle: He exhibits no swelling.  Lymphadenopathy:    He has no cervical adenopathy.  Neurological: He is alert. No cranial nerve  deficit.  Skin: Skin is warm. No rash noted. Nails show no clubbing.  Psychiatric: He has a normal mood and affect.      Data Reviewed: Basic Metabolic Panel: Recent Labs  Lab 03/20/19 1246  CREATININE 1.26*   CBC: Recent Labs  Lab 03/20/19 1246  WBC 7.9  HGB 16.0  HCT 48.3  MCV 88.3  PLT 227     No results found for this or any previous visit (from the past 240 hour(s)).    Scheduled Meds: . amLODipine  10 mg Oral Daily  . apixaban  5 mg Oral BID  . atorvastatin  20 mg Oral q1800  . feeding supplement (ENSURE ENLIVE)  237 mL Oral BID BM  . folic acid  1 mg Oral Daily  . metoprolol tartrate  25 mg Oral BID  . mirtazapine  15 mg Oral QHS  . multivitamin with minerals  1 tablet Oral Daily  . polyethylene glycol  17 g Oral Daily  . senna-docusate  2 tablet Oral BID  . tamsulosin  0.4 mg Oral Daily  . thiamine  100 mg Oral Daily  . venlafaxine XR  150 mg Oral Q breakfast    Assessment/Plan:  1. Right lower extremity paralysis.  Etiology unclear possible psychological, work-up so far negative 2. Sepsis due to [name] bacteremia- resolved.  [name] in the blood on 01/20/2019.  Completed course of antibiotics. 3. Acute encephalopathy.  Possibility of rapidly progressive dementia.  RPR, B12, TSH all  normal.  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  Answering questions appropriately this morning. 4. Depression continue with Remeron 5. Portal vein thrombosis on Eliquis 6. Chronic hypertension continue Norvasc 7. Hyperlipidemia unspecified on atorvastatin 8. Elevated creatinine- Cr increased from 1.11 to 1.26. Not on any nephrotoxic agents. Recheck Cr in the morning. COVID-19 test negative.     Code Status:     Code Status Orders  (From admission, onward)         Start     Ordered   01/20/19 1235  Full code  Continuous     01/20/19 1235        Code Status History    This patient has a current code status but no historical  code status.    Advance Directive Documentation     Most Recent Value  Type of Advance Directive  Healthcare Power of Attorney  Pre-existing out of facility DNR order (yellow form or pink MOST form)  -  "MOST" Form in Place?  -     Disposition Plan: Trying to obtain legal guardian.  Time spent: 25 minutes.  [name]  [employer]

## 2019-03-22 LAB — CREATININE, SERUM
Creatinine, Ser: 0.98 mg/dL (ref 0.61–1.24)
GFR calc Af Amer: >60 mL/min (ref >60)
GFR calc non Af Amer: >60 mL/min (ref >60)

## 2019-03-22 NOTE — Progress Notes (Signed)
Patient ID: Maxwell Miller Matthews, male   DOB: 05/11/1961, 58 y.o.   MRN: [id number]  [facility] PROGRESS NOTE  Maxwell Miller Mutch [id number] DOB: 05/11/1961 DOA: 01/20/2019 PCP: Mable ParisWilliams, Katrina, PA-C  HPI/Subjective:  Patient continues to do well this morning.  When asked how he is doing, he states "you tell me".  No other concerns.   Objective: Vitals:   03/21/19 1958 03/22/19 0439  BP: 137/82 132/84  Pulse: 88 72  Resp:  18  Temp: 98.8 F (37.1 C) 97.9 F (36.6 C)  SpO2: 96% 95%    Filed Weights   01/20/19 0826 01/29/19 0340 02/12/19 0437  Weight: 81.6 kg 86 kg 87.6 kg    ROS: Review of Systems  Unable to perform ROS: Dementia  Respiratory: Negative for cough and shortness of breath.   Cardiovascular: Negative for chest pain.  Gastrointestinal: Negative for abdominal pain, constipation, diarrhea, nausea and vomiting.  Genitourinary: Negative for dysuria.  Musculoskeletal: Negative for joint pain.  Neurological: Positive for focal weakness. Negative for dizziness and headaches.   Exam: Physical Exam  Constitutional:  Laying in bed, in NAD  HENT:  Nose: No mucosal edema.  Mouth/Throat: No oropharyngeal exudate or posterior oropharyngeal edema.  Eyes: Pupils are equal, round, and reactive to light. Conjunctivae, EOM and lids are normal.  Neck: No JVD present. Carotid bruit is not present. No edema present. No thyroid mass and no thyromegaly present.  Cardiovascular: S1 normal and S2 normal. Exam reveals no gallop.  No murmur heard. Pulses:      Dorsalis pedis pulses are 2+ on the right side and 2+ on the left side.  Respiratory: No respiratory distress. He has no wheezes. He has no rhonchi. He has no rales.  GI: Soft. Bowel sounds are normal. There is no abdominal tenderness.  Musculoskeletal:     Right ankle: He exhibits no swelling.     Left ankle: He exhibits no swelling.  Lymphadenopathy:    He has no cervical adenopathy.  Neurological: He is alert. No  cranial nerve deficit.  Skin: Skin is warm. No rash noted. Nails show no clubbing.  Psychiatric: He has a normal mood and affect.      Data Reviewed: Basic Metabolic Panel: Recent Labs  Lab 03/20/19 1246 03/22/19 0429  CREATININE 1.26* 0.98   CBC: Recent Labs  Lab 03/20/19 1246  WBC 7.9  HGB 16.0  HCT 48.3  MCV 88.3  PLT 227     No results found for this or any previous visit (from the past 240 hour(s)).    Scheduled Meds: . amLODipine  10 mg Oral Daily  . apixaban  5 mg Oral BID  . atorvastatin  20 mg Oral q1800  . feeding supplement (ENSURE ENLIVE)  237 mL Oral BID BM  . folic acid  1 mg Oral Daily  . metoprolol tartrate  25 mg Oral BID  . mirtazapine  15 mg Oral QHS  . multivitamin with minerals  1 tablet Oral Daily  . polyethylene glycol  17 g Oral Daily  . senna-docusate  2 tablet Oral BID  . tamsulosin  0.4 mg Oral Daily  . thiamine  100 mg Oral Daily  . venlafaxine XR  150 mg Oral Q breakfast    Assessment/Plan:  1. Right lower extremity paralysis.  Etiology unclear possible psychological, work-up so far negative 2. Sepsis due to [name] bacteremia- resolved.  [name] in the blood on 01/20/2019.  Completed course of antibiotics. 3. Acute encephalopathy.  Possibility of rapidly  progressive dementia.  RPR, B12, TSH all normal.  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  Answering questions appropriately this morning. 4. Depression continue with Remeron 5. Portal vein thrombosis on Eliquis 6. Chronic hypertension continue Norvasc 7. Hyperlipidemia unspecified on atorvastatin 8. Elevated creatinine- Cr now back to baseline. COVID-19 test negative.     Code Status:     Code Status Orders  (From admission, onward)         Start     Ordered   01/20/19 1235  Full code  Continuous     01/20/19 1235        Code Status History    This patient has a current code status but no historical code status.    Advance  Directive Documentation     Most Recent Value  Type of Advance Directive  Healthcare Power of Attorney  Pre-existing out of facility DNR order (yellow form or pink MOST form)  -  "MOST" Form in Place?  -     Disposition Plan: Trying to obtain legal guardian.  Time spent: 20 minutes.  Jinny Blossom   [employer]

## 2019-03-22 NOTE — Progress Notes (Signed)
OT Cancellation Note  Patient Details Name: Maxwell Miller MRN: [id number] DOB: 12/11/1960   Cancelled Treatment:    Reason Eval/Treat Not Completed: Patient declined, no reason specified. Pt declining therapy this date, despite encouragement. Pt states, "I just want something to munch on." Snacks provided to pt, per his request. Continues to politely decline therapy. Will re-attempt at later date/time as pt is agreeable to participating.   Richrd Prime, MPH, MS, OTR/L ascom [phone] 03/22/19, 2:49 PM

## 2019-03-23 LAB — CBC
HCT: 48.2 % (ref 39.0–52.0)
Hemoglobin: 16 g/dL (ref 13.0–17.0)
MCH: 29 pg (ref 26.0–34.0)
MCHC: 33.2 g/dL (ref 30.0–36.0)
MCV: 87.3 fL (ref 80.0–100.0)
Platelets: 197 10*3/uL (ref 150–400)
RBC: 5.52 MIL/uL (ref 4.22–5.81)
RDW: 16.1 % — ABNORMAL HIGH (ref 11.5–15.5)
WBC: 7.6 10*3/uL (ref 4.0–10.5)
nRBC: 0.3 % — ABNORMAL HIGH (ref 0.0–0.2)

## 2019-03-23 NOTE — Progress Notes (Signed)
Physical Therapy Treatment Patient Details Name: Maxwell Miller MRN: [id number] DOB: 02/22/1961 Today's Date: 03/23/2019    History of Present Illness 58 y.o. male who comes [facility] on 01/20/19 with AMS, confusion, and fever, admitted with sepsis and related encephalopathy. 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 Per neuro notes at [facility], pt hd also made good progress working with psychiatry.  PMHx: portal vein thrombosis, CVA, HTN, erythrocytosis.     PT Comments    Patient is in bed upon PT entering room. Initially hesitant to PT however was agreeable to supine exercises. After performance of multiple interventions patient was agreeable to sitting EOB with Mod A. Upon sitting EOB patient became dizzy, which was relieved with cuing for breathing. Focus of maintaining/obtaining midline required cueing via tactile and verbal array with patient progressively leaning more with fatigue. 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 Patient's current POC remains appropriate at this time.    Follow Up Recommendations  SNF     Equipment Recommendations  Wheelchair (measurements PT);Wheelchair cushion (measurements PT);3in1 (PT);Other (comment)(hoyer lift)    Recommendations for Other Services       Precautions / Restrictions Precautions Precautions: Fall Restrictions Weight Bearing Restrictions: No    Mobility  Bed Mobility Overal bed mobility: Needs Assistance Bed Mobility: Supine to Sit;Sit to Supine     Supine to sit: Mod assist;HOB elevated Sit to supine: Mod assist;HOB elevated   General bed mobility comments: mod assist for trunk and B LE's, patient requiring firm simple commands. Needs assistance bringing legs off and onto bed.   Transfers                 General transfer  comment: Not attempted this session due to dizziness and fatigue seated   Ambulation/Gait                 Stairs             Wheelchair Mobility    Modified Rankin (Stroke Patients Only)       Balance Overall balance assessment: Needs assistance Sitting-balance support: Feet supported;Single extremity supported Sitting balance-Leahy Scale: Poor Sitting balance - Comments: pt tends to lean/fall to R side but when cued to lean into L UE placed on bed pt SBA (or pt SBA if distracted) Postural control: Right lateral lean                                  Cognition Arousal/Alertness: Awake/alert Behavior During Therapy: Flat affect Overall Cognitive Status: No family/caregiver present to determine baseline cognitive functioning Area of Impairment: Memory;Attention                   Current Attention Level: Selective Memory: Decreased recall of precautions;Decreased short-term memory Following Commands: Follows one step commands inconsistently Safety/Judgement: Decreased awareness of safety   Problem Solving: Slow processing;Requires verbal cues;Requires tactile cues General Comments: Patient is self limiting, becomes philosophical and verbose to attempt to redirect away from mobility       Exercises General Exercises - Lower Extremity Ankle Circles/Pumps: AAROM;15 reps;AROM;Both(tactile cueing with quick stretch for RLE) Quad Sets: AROM;Left;15 reps;AAROM;Right(quickstretch muscle activation performed) Gluteal Sets: AROM;Both;10 reps;Supine Heel Slides: Both;AAROM;Strengthening;15 reps(AAROM with PT assist RLE, AROM LLE) Hip ABduction/ADduction: AAROM;AROM;Both;15 reps(AAROM only on R) Straight Leg Raises:  AAROM;Both;15 reps(AAROM BLE) Other Exercises Other Exercises: bridging in supine position with PT assitance for LE stabilization, max cueing for encouragement, limited ability to clear bed however noted bilateral muscle contraction 10x, LE  rotation in hooklying for low back muscle tension release Other Exercises: seated trunk control sitting EOB with CGA and tactile/verbal cueing for midline positioning. sitting discussing patient's previous career [place] Trunk lean progressively worsening with fatigue ~12 minutes     General Comments General comments (skin integrity, edema, etc.): patient requries extra time for processing and sequencing of motor task/muscle initiation. Requires redirection for safety and sequencing       Pertinent Vitals/Pain Pain Assessment: No/denies pain    Home Living                      Prior Function            PT Goals (current goals can now be found in the care plan section) Acute Rehab PT Goals Patient Stated Goal: to increase strength and mobility PT Goal Formulation: With patient Time For Goal Achievement: 04/02/19 Potential to Achieve Goals: Fair Progress towards PT goals: Progressing toward goals(slowly, patient inconsistant with progression )    Frequency    Min 2X/week      PT Plan Current plan remains appropriate    Co-evaluation              AM-PAC PT "6 Clicks" Mobility   Outcome Measure  Help needed turning from your back to your side while in a flat bed without using bedrails?: A Lot Help needed moving from lying on your back to sitting on the side of a flat bed without using bedrails?: A Lot Help needed moving to and from a bed to a chair (including a wheelchair)?: Total Help needed standing up from a chair using your arms (e.g., wheelchair or bedside chair)?: Total Help needed to walk in hospital room?: Total Help needed climbing 3-5 steps with a railing? : Total 6 Click Score: 8    End of Session Equipment Utilized During Treatment: Gait belt Activity Tolerance: Patient tolerated treatment well Patient left: in bed;with call bell/phone within reach;with bed alarm set Nurse Communication: Mobility status;Precautions PT  Visit Diagnosis: Unsteadiness on feet (R26.81);Other abnormalities of gait and mobility (R26.89);Adult, failure to thrive (R62.7)     Time: 0926-0955 PT Time Calculation (min) (ACUTE ONLY): 29 min  Charges:  $Therapeutic Exercise: 8-22 mins $Therapeutic Activity: 8-22 mins                     Precious BardMarina Krystin Keeven, PT, DPT     Precious BardMarina Lubna Stegeman 03/23/2019, 10:12 AM

## 2019-03-23 NOTE — Progress Notes (Signed)
Patient ID: Maxwell Miller, male   DOB: 1961-01-12, 58 y.o.   MRN: [id number]  [facility] PROGRESS NOTE  Maxwell Miller [id number] DOB: 1961-01-12 DOA: 01/20/2019 PCP: Mable ParisWilliams, Katrina, PA-C  HPI/Subjective:  No concerns this morning.  Eating and drinking without any issues.  No chest pain or shortness of breath.   Objective: Vitals:   03/23/19 0423 03/23/19 0904  BP: 123/84 122/76  Pulse: 78 87  Resp: 18 18  Temp: 98.4 F (36.9 C)   SpO2: 96% 97%    Filed Weights   01/20/19 0826 01/29/19 0340 02/12/19 0437  Weight: 81.6 kg 86 kg 87.6 kg    ROS: Review of Systems  Unable to perform ROS: Dementia  Respiratory: Negative for cough and shortness of breath.   Cardiovascular: Negative for chest pain.  Gastrointestinal: Negative for abdominal pain, constipation, diarrhea, nausea and vomiting.  Genitourinary: Negative for dysuria.  Musculoskeletal: Negative for joint pain.  Neurological: Positive for focal weakness. Negative for dizziness and headaches.   Exam: Physical Exam  Constitutional:  Laying in bed, in NAD  HENT:  Nose: No mucosal edema.  Mouth/Throat: No oropharyngeal exudate or posterior oropharyngeal edema.  Eyes: Pupils are equal, round, and reactive to light. Conjunctivae, EOM and lids are normal.  Neck: No JVD present. Carotid bruit is not present. No edema present. No thyroid mass and no thyromegaly present.  Cardiovascular: S1 normal and S2 normal. Exam reveals no gallop.  No murmur heard. Pulses:      Dorsalis pedis pulses are 2+ on the right side and 2+ on the left side.  Respiratory: No respiratory distress. He has no wheezes. He has no rhonchi. He has no rales.  GI: Soft. Bowel sounds are normal. There is no abdominal tenderness.  Musculoskeletal:     Right ankle: He exhibits no swelling.     Left ankle: He exhibits no swelling.  Lymphadenopathy:    He has no cervical adenopathy.  Neurological: He is alert. No cranial nerve deficit.   Skin: Skin is warm. No rash noted. Nails show no clubbing.  Psychiatric: He has a normal mood and affect.      Data Reviewed: Basic Metabolic Panel: Recent Labs  Lab 03/20/19 1246 03/22/19 0429  CREATININE 1.26* 0.98   CBC: Recent Labs  Lab 03/20/19 1246 03/23/19 0513  WBC 7.9 7.6  HGB 16.0 16.0  HCT 48.3 48.2  MCV 88.3 87.3  PLT 227 197     No results found for this or any previous visit (from the past 240 hour(s)).    Scheduled Meds: . amLODipine  10 mg Oral Daily  . apixaban  5 mg Oral BID  . atorvastatin  20 mg Oral q1800  . feeding supplement (ENSURE ENLIVE)  237 mL Oral BID BM  . folic acid  1 mg Oral Daily  . metoprolol tartrate  25 mg Oral BID  . mirtazapine  15 mg Oral QHS  . multivitamin with minerals  1 tablet Oral Daily  . polyethylene glycol  17 g Oral Daily  . senna-docusate  2 tablet Oral BID  . tamsulosin  0.4 mg Oral Daily  . thiamine  100 mg Oral Daily  . venlafaxine XR  150 mg Oral Q breakfast    Assessment/Plan:  1. Right lower extremity paralysis.  Etiology unclear possible psychological, work-up so far negative 2. Sepsis due to [name] bacteremia- resolved.  [name] in the blood on 01/20/2019.  Completed course of antibiotics. 3. Acute encephalopathy.  Possibility of rapidly  progressive dementia.  RPR, B12, TSH all normal.  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  Answering questions appropriately this morning. 4. Depression continue with Remeron 5. Portal vein thrombosis on Eliquis 6. Chronic hypertension continue Norvasc 7. Hyperlipidemia unspecified on atorvastatin 8. Elevated creatinine- Cr now back to baseline. COVID-19 test negative.    Plan for discharge to SNF.   Code Status:     Code Status Orders  (From admission, onward)         Start     Ordered   01/20/19 1235  Full code  Continuous     01/20/19 1235        Code Status History    This patient has a current code status but  no historical code status.    Advance Directive Documentation     Most Recent Value  Type of Advance Directive  Healthcare Power of Attorney  Pre-existing out of facility DNR order (yellow form or pink MOST form)  -  "MOST" Form in Place?  -     Disposition Plan: Trying to obtain legal guardian.  Time spent: 20 minutes.  Jinny Blossom Tiandra Swoveland  [employer]

## 2019-03-24 NOTE — Progress Notes (Signed)
PT Cancellation Note  Patient Details Name: Maxwell Miller MRN: [id number] DOB: 08/21/1961   Cancelled Treatment:    Reason Eval/Treat Not Completed: Fatigue/lethargy limiting ability to participate(Patient sleeping/pretending to sleep upon PT attempt. )Will attempt again at later time/date.   Precious Bard, PT, DPT   03/24/2019, 10:01 AM

## 2019-03-24 NOTE — Progress Notes (Signed)
Patient ID: Maxwell Miller, male   DOB: 10-May-1961, 58 y.o.   MRN: [id number]  [facility] PROGRESS NOTE  Maxwell Miller [id number] DOB: Dec 01, 1960 DOA: 01/20/2019 PCP: Mable Paris, PA-C  HPI/Subjective:  States he is still doing well this morning.  He has been reading to pass the time.  He is ready to leave the hospital.  Per social work, patient may be able to be placed later this week.  Patient has no concerns this morning.   Objective: Vitals:   03/23/19 2006 03/24/19 0449  BP: (!) 145/86 134/87  Pulse: 91 77  Resp: 19 18  Temp: 98.6 F (37 C) 98.2 F (36.8 C)  SpO2: 100% 98%    Filed Weights   01/20/19 0826 01/29/19 0340 02/12/19 0437  Weight: 81.6 kg 86 kg 87.6 kg    ROS: Review of Systems  Unable to perform ROS: Dementia  Respiratory: Negative for cough and shortness of breath.   Cardiovascular: Negative for chest pain.  Gastrointestinal: Negative for abdominal pain, constipation, diarrhea, nausea and vomiting.  Genitourinary: Negative for dysuria.  Musculoskeletal: Negative for joint pain.  Neurological: Positive for focal weakness. Negative for dizziness and headaches.   Exam: Physical Exam  Constitutional:  Laying in bed, in NAD  HENT:  Nose: No mucosal edema.  Mouth/Throat: No oropharyngeal exudate or posterior oropharyngeal edema.  Eyes: Pupils are equal, round, and reactive to light. Conjunctivae, EOM and lids are normal.  Neck: No JVD present. Carotid bruit is not present. No edema present. No thyroid mass and no thyromegaly present.  Cardiovascular: S1 normal and S2 normal. Exam reveals no gallop.  No murmur heard. Pulses:      Dorsalis pedis pulses are 2+ on the right side and 2+ on the left side.  Respiratory: No respiratory distress. He has no wheezes. He has no rhonchi. He has no rales.  GI: Soft. Bowel sounds are normal. There is no abdominal tenderness.  Musculoskeletal:     Right ankle: He exhibits no swelling.     Left  ankle: He exhibits no swelling.  Lymphadenopathy:    He has no cervical adenopathy.  Neurological: He is alert. No cranial nerve deficit.  Skin: Skin is warm. No rash noted. Nails show no clubbing.  Psychiatric: He has a normal mood and affect.      Data Reviewed: Basic Metabolic Panel: Recent Labs  Lab 03/20/19 1246 03/22/19 0429  CREATININE 1.26* 0.98   CBC: Recent Labs  Lab 03/20/19 1246 03/23/19 0513  WBC 7.9 7.6  HGB 16.0 16.0  HCT 48.3 48.2  MCV 88.3 87.3  PLT 227 197     No results found for this or any previous visit (from the past 240 hour(s)).    Scheduled Meds: . amLODipine  10 mg Oral Daily  . apixaban  5 mg Oral BID  . atorvastatin  20 mg Oral q1800  . feeding supplement (ENSURE ENLIVE)  237 mL Oral BID BM  . folic acid  1 mg Oral Daily  . metoprolol tartrate  25 mg Oral BID  . mirtazapine  15 mg Oral QHS  . multivitamin with minerals  1 tablet Oral Daily  . polyethylene glycol  17 g Oral Daily  . senna-docusate  2 tablet Oral BID  . tamsulosin  0.4 mg Oral Daily  . thiamine  100 mg Oral Daily  . venlafaxine XR  150 mg Oral Q breakfast    Assessment/Plan:  1. Right lower extremity paralysis.  Etiology unclear possible psychological, work-up  so far negative 2. Sepsis due to [name] bacteremia- resolved.  [name] in the blood on 01/20/2019.  Completed course of antibiotics. 3. Acute encephalopathy.  Possibility of rapidly progressive dementia.  RPR, B12, TSH all normal.  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  Answering questions appropriately this morning. 4. Depression continue with Remeron 5. Portal vein thrombosis on Eliquis 6. Chronic hypertension continue Norvasc 7. Hyperlipidemia unspecified on atorvastatin 8. Elevated creatinine- Cr now back to baseline. COVID-19 test negative.    Plan for discharge to SNF.  CSW assisting with placement.   Code Status:     Code Status Orders  (From admission,  onward)         Start     Ordered   01/20/19 1235  Full code  Continuous     01/20/19 1235        Code Status History    This patient has a current code status but no historical code status.    Advance Directive Documentation     Most Recent Value  Type of Advance Directive  Healthcare Power of Attorney  Pre-existing out of facility DNR order (yellow form or pink MOST form)  -  "MOST" Form in Place?  -     Disposition Plan: Trying to obtain legal guardian.  Time spent: 20 minutes.  Jinny BlossomKaty D Mayo  [employer]

## 2019-03-25 LAB — CREATININE, SERUM
Creatinine, Ser: 1.06 mg/dL (ref 0.61–1.24)
GFR calc Af Amer: >60 mL/min (ref >60)
GFR calc non Af Amer: >60 mL/min (ref >60)

## 2019-03-25 NOTE — Progress Notes (Signed)
Patient ID: Maxwell Miller, male   DOB: 02/27/1961, 58 y.o.   MRN: [id number]  [facility] PROGRESS NOTE  Maxwell Miller GOT:[id number] DOB: [birth year] DOA: 01/20/2019 PCP: Mable Paris, PA-C  HPI/Subjective:  No concerns this morning.  No chest pain, no shortness of breath, no abdominal pain.   Objective: Vitals:   03/24/19 1945 03/25/19 0347  BP: (!) 143/98 136/90  Pulse: 92 76  Resp: 15 15  Temp: 98.5 F (36.9 C) 98.2 F (36.8 C)  SpO2: 97% 97%    Filed Weights   01/20/19 0826 01/29/19 0340 02/12/19 0437  Weight: 81.6 kg 86 kg 87.6 kg    ROS: Review of Systems  Unable to perform ROS: Dementia  Respiratory: Negative for cough and shortness of breath.   Cardiovascular: Negative for chest pain.  Gastrointestinal: Negative for abdominal pain, constipation, diarrhea, nausea and vomiting.  Genitourinary: Negative for dysuria.  Musculoskeletal: Negative for joint pain.  Neurological: Positive for focal weakness. Negative for dizziness and headaches.   Exam: Physical Exam  Constitutional:  Laying in bed, in NAD  HENT:  Nose: No mucosal edema.  Mouth/Throat: No oropharyngeal exudate or posterior oropharyngeal edema.  Eyes: Pupils are equal, round, and reactive to light. Conjunctivae, EOM and lids are normal.  Neck: No JVD present. Carotid bruit is not present. No edema present. No thyroid mass and no thyromegaly present.  Cardiovascular: S1 normal and S2 normal. Exam reveals no gallop.  No murmur heard. Pulses:      Dorsalis pedis pulses are 2+ on the right side and 2+ on the left side.  Respiratory: No respiratory distress. He has no wheezes. He has no rhonchi. He has no rales.  GI: Soft. Bowel sounds are normal. There is no abdominal tenderness.  Musculoskeletal:     Right ankle: He exhibits no swelling.     Left ankle: He exhibits no swelling.  Lymphadenopathy:    He has no cervical adenopathy.  Neurological: He is alert. No cranial nerve deficit.   Skin: Skin is warm. No rash noted. Nails show no clubbing.  Psychiatric: He has a normal mood and affect.      Data Reviewed: Basic Metabolic Panel: Recent Labs  Lab 03/20/19 1246 03/22/19 0429 03/25/19 0418  CREATININE 1.26* 0.98 1.06   CBC: Recent Labs  Lab 03/20/19 1246 03/23/19 0513  WBC 7.9 7.6  HGB 16.0 16.0  HCT 48.3 48.2  MCV 88.3 87.3  PLT 227 197     No results found for this or any previous visit (from the past 240 hour(s)).    Scheduled Meds: . amLODipine  10 mg Oral Daily  . apixaban  5 mg Oral BID  . atorvastatin  20 mg Oral q1800  . feeding supplement (ENSURE ENLIVE)  237 mL Oral BID BM  . folic acid  1 mg Oral Daily  . metoprolol tartrate  25 mg Oral BID  . mirtazapine  15 mg Oral QHS  . multivitamin with minerals  1 tablet Oral Daily  . polyethylene glycol  17 g Oral Daily  . senna-docusate  2 tablet Oral BID  . tamsulosin  0.4 mg Oral Daily  . thiamine  100 mg Oral Daily  . venlafaxine XR  150 mg Oral Q breakfast    Assessment/Plan:  1. Right lower extremity paralysis.  Etiology unclear possible psychological, work-up so far negative 2. Sepsis due to [name] bacteremia- resolved.  [name] in the blood on 01/20/2019.  Completed course of antibiotics. 3. Acute encephalopathy.  Possibility of rapidly progressive dementia.  RPR, B12, TSH all normal.  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  Answering questions appropriately this morning. 4. Depression continue with Remeron 5. Portal vein thrombosis on Eliquis 6. Chronic hypertension continue Norvasc 7. Hyperlipidemia unspecified on atorvastatin 8. Elevated creatinine- Cr now back to baseline. COVID-19 test negative.    Plan for discharge to SNF.  CSW assisting with placement. Hopeful for placement this week.   Code Status:     Code Status Orders  (From admission, onward)         Start     Ordered   01/20/19 1235  Full code  Continuous     01/20/19  1235        Code Status History    This patient has a current code status but no historical code status.    Advance Directive Documentation     Most Recent Value  Type of Advance Directive  Healthcare Power of Attorney  Pre-existing out of facility DNR order (yellow form or pink MOST form)  -  "MOST" Form in Place?  -     Disposition Plan: Trying to obtain legal guardian.  Time spent: 20 minutes.  Jinny BlossomKaty D Mane Consolo  [employer]

## 2019-03-25 NOTE — Progress Notes (Signed)
PT Cancellation Note  Patient Details Name: Maxwell Miller MRN: [id number] DOB: 06-21-1961   Cancelled Treatment:    Reason Eval/Treat Not Completed: Other (comment)(Patient eating/drinking upon PT attempt. Will return at later time/date when patient available. )  Precious Bard, PT, DPT   03/25/2019, 1:33 PM

## 2019-03-25 NOTE — TOC Progression Note (Signed)
Transition of Care [facility]) - Progression Note    Patient Details  Name: Maxwell Miller MRN: [id number] Date of Birth: 09-26-1961  Transition of Care [facility]) CM/SW Contact  Ruthe Mannan, [place] Phone Number: 03/25/2019, 2:46 PM  Clinical Narrative:  CSW spoke with Arthur Holms, DSS worker following patient. Trula Ore states that patient's estranged wife has applied for SSI and she has applied for medicaid for patient. CSW spoke with patient's estranged wife Melodie Bouillon and she confirmed that she has applied for both SSI and Medicaid. Per wife, both are pending. CSW spoke with Tammy at [facility] in [place] and she will check to see if they can still offer on patient. CSW will continue to follow for discharge planning.     Expected Discharge Plan: Home w Home Health Services Barriers to Discharge: Family Issues, Transportation  Expected Discharge Plan and Services Expected Discharge Plan: Home w Home Health Services   Discharge Planning Services: CM Consult Post Acute Care Choice: Home Health, Durable Medical Equipment   Expected Discharge Date: 03/22/19                         HH Arranged: RN, PT, Nurse's Aide HH Agency: [facility]         Social Determinants of Health (SDOH) Interventions    Readmission Risk Interventions No flowsheet data found.

## 2019-03-25 NOTE — Progress Notes (Signed)
Occupational Therapy Re-Evaluation Patient Details Name: Maxwell Miller MRN: [id number] DOB: [birth year] Today's Date: 03/25/2019    History of present illness 58 y.o. male who comes to [facility] on 01/20/19 with AMS, confusion, and fever, admitted with sepsis and related encephalopathy. 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 Per neuro notes at [facility], pt hd also made good progress working with psychiatry.  PMHx: portal vein thrombosis, CVA, HTN, erythrocytosis.    OT comments  Pt seen for OT re-evaluation and treatment this date. Pt continues to vacillate between various assist levels for functional mobility and ADL tasks. Pt appears to be self limiting at times. Today pt eager to attempt to get to recliner. Mod-Max A +2 with cues for sequencing and total assist for RLE, pt performed sup>sit EOB. Once EOB pt reports dizziness (room spinning), BUE shaking (R worse than L), and requiring varying SBA to Mod A to correct for R lateral lean while sitting. Min-Mod A to support leaning forward and to R/L for functional reaching and sitting balance task. Pt BP 140/86. Pt reports that dizziness did not resolve or get any better once laying back down. RN and NT in room at end of session for medications and pt care. Pt continues to benefit from skilled OT Services to maximize safety/indep and minimize risk of falls, functional decline, and increased caregiver burden. STR remains appropriate. OT goals updated to reflect pt progress.    Follow Up Recommendations  SNF;Supervision/Assistance - 24 hour    Equipment Recommendations  3 in 1 bedside commode    Recommendations for Other Services      Precautions / Restrictions Precautions Precautions: Fall Restrictions Weight Bearing Restrictions: No       Mobility Bed Mobility Overal bed  mobility: Needs Assistance Bed Mobility: Supine to Sit;Sit to Supine     Supine to sit: Mod assist;HOB elevated;+2 for physical assistance Sit to supine: Mod assist;+2 for physical assistance   General bed mobility comments: VC for sequencing   Transfers                 General transfer comment: unsafe to attempt this session    Balance Overall balance assessment: Needs assistance Sitting-balance support: Bilateral upper extremity supported;Feet supported Sitting balance-Leahy Scale: Poor Sitting balance - Comments: SBA to Mod A to correct R lateral lean and excessive shaking of BUE (R worse than L) Postural control: Right lateral lean                                 ADL either performed or assessed with clinical judgement   ADL Overall ADL's : Needs assistance/impaired Eating/Feeding: Sitting;Independent   Grooming: Sitting;Set up                                       Vision Patient Visual Report: No change from baseline     Perception     Praxis      Cognition Arousal/Alertness: Awake/alert Behavior During Therapy: Flat affect Overall Cognitive Status: No family/caregiver present to determine baseline cognitive functioning Area of Impairment: Memory;Attention                   Current Attention Level: Selective Memory: Decreased recall of precautions;Decreased short-term  memory Following Commands: Follows one step commands inconsistently Safety/Judgement: Decreased awareness of safety   Problem Solving: Slow processing;Requires verbal cues;Requires tactile cues General Comments: Patient is self limiting, becomes philosophical and verbose to attempt to redirect away from mobility         Exercises  Other Exercises Other Exercises: seated reaching in and outside BOS requiring Min-Mod A and cues to lean forward and to each side to improve reach   Shoulder Instructions       General Comments      Pertinent  Vitals/ Pain       Pain Assessment: 0-10 Pain Score: 4  Pain Location: back Pain Descriptors / Indicators: Aching;Discomfort Pain Intervention(s): Limited activity within patient's tolerance;Monitored during session;Repositioned  Home Living                                          Prior Functioning/Environment              Frequency  Min 1X/week        Progress Toward Goals  OT Goals(current goals can now be found in the care plan section)  Progress towards OT goals: OT to reassess next treatment  Acute Rehab OT Goals Patient Stated Goal: to increase strength and mobility OT Goal Formulation: With patient Time For Goal Achievement: 04/08/19 Potential to Achieve Goals: Fair ADL Goals Pt Will Perform Upper Body Dressing: with mod assist;with min assist;sitting Pt Will Perform Lower Body Dressing: with max assist;with mod assist;sitting/lateral leans Pt Will Transfer to Toilet: stand pivot transfer;bedside commode;with mod assist  Plan Discharge plan remains appropriate;Frequency remains appropriate    Co-evaluation                 AM-PAC OT "6 Clicks" Daily Activity     Outcome Measure   Help from another person eating meals?: None Help from another person taking care of personal grooming?: None Help from another person toileting, which includes using toliet, bedpan, or urinal?: A Lot Help from another person bathing (including washing, rinsing, drying)?: A Lot Help from another person to put on and taking off regular upper body clothing?: A Little Help from another person to put on and taking off regular lower body clothing?: A Lot 6 Click Score: 17    End of Session    OT Visit Diagnosis: Other abnormalities of gait and mobility (R26.89);Muscle weakness (generalized) (M62.81)   Activity Tolerance Patient tolerated treatment well   Patient Left in bed;with call bell/phone within reach;with bed alarm set;with nursing/sitter in room    Nurse Communication          Time: [id number] OT Time Calculation (min): 26 min  Charges: OT General Charges $OT Visit: 1 Visit OT Evaluation $OT Re-eval: 1 Re-eval OT Treatments $Therapeutic Activity: 8-22 mins  Richrd PrimeJamie Stiller, MPH, MS, OTR/L ascom [phone] 03/25/19, 4:45 PM

## 2019-03-25 NOTE — Progress Notes (Signed)
Took over pt care at 1515, pt alert, watching tv, no complaints

## 2019-03-25 NOTE — Progress Notes (Signed)
Physical Therapy Treatment Patient Details Name: Maxwell Miller MRN: [id number] DOB: [birth year] Today's Date: 03/25/2019    History of Present Illness 58 y.o. male who [facility] on 01/20/19 with AMS, confusion, and fever, admitted with sepsis and related encephalopathy. 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, pt hd also made good progress working with psychiatry.  PMHx: portal vein thrombosis, CVA, HTN, erythrocytosis.     PT Comments    Patient is in bed upon PT arrival, states he is tired but not tired. Agreeable to supine LE activities however continuously attempted redirection to delay exercise interventions. Attempted EOB with patient with Mod A with excessive shaking of RUE in sitting position resulting in need for Mod A to maintain sitting balance. Patient declines further mobility and was returned to bed. Nurse notified of shaking of RUE with movement. Current POC remains appropriate at this time.     Follow Up Recommendations  SNF     Equipment Recommendations  Wheelchair (measurements PT);Wheelchair cushion (measurements PT);3in1 (PT);Other (comment)(hoyer lift)    Recommendations for Other Services       Precautions / Restrictions Precautions Precautions: Fall Restrictions Weight Bearing Restrictions: No    Mobility  Bed Mobility Overal bed mobility: Needs Assistance Bed Mobility: Supine to Sit;Sit to Supine     Supine to sit: Mod assist;HOB elevated Sit to supine: Mod assist;HOB elevated   General bed mobility comments: mod assist for trunk and B LE's, patient requiring firm simple commands. Needs assistance bringing legs off and onto bed. excessive shaking of R UE with sitting EOB  Transfers                 General transfer comment: not able to be  attempted. patient shaking and declined further mobility   Ambulation/Gait                 Stairs             Wheelchair Mobility    Modified Rankin (Stroke Patients Only)       Balance Overall balance assessment: Needs assistance Sitting-balance support: Feet supported;Single extremity supported Sitting balance-Leahy Scale: Poor Sitting balance - Comments: pt tends to lean/fall to R side, this session had excessive shaking of R arm resulting in Mod A to maintain EOB sitting Postural control: Right lateral lean;Posterior lean                                  Cognition Arousal/Alertness: Awake/alert Behavior During Therapy: Flat affect Overall Cognitive Status: No family/caregiver present to determine baseline cognitive functioning Area of Impairment: Memory;Attention                   Current Attention Level: Selective Memory: Decreased recall of precautions;Decreased short-term memory Following Commands: Follows one step commands inconsistently Safety/Judgement: Decreased awareness of safety   Problem Solving: Slow processing;Requires verbal cues;Requires tactile cues General Comments: Patient is self limiting, becomes philosophical and verbose to attempt to redirect away from mobility       Exercises General Exercises - Lower Extremity Ankle Circles/Pumps: AAROM;15 reps;AROM;Both(tactile cueing with quick stretch for RLE) Quad Sets: AROM;Left;15 reps;AAROM;Right(quickstretch muscle activation performed) Gluteal Sets: AROM;Both;10 reps;Supine Heel Slides: Both;AAROM;Strengthening;15 reps(AAROM with PT assist RLE, AROM LLE) Hip ABduction/ADduction: AAROM;AROM;Both;15 reps(AAROM only on R) Straight  Leg Raises: AAROM;Both;15 reps(AAROM BLE) Other Exercises Other Exercises: bridging in supine position with PT assitance for LE stabilization, max cueing for encouragement, limited ability to clear bed however noted bilateral muscle  contraction 10x, LE rotation in hooklying for low back muscle tension release Other Exercises: seated trunk control sitting EOB, terminated with excessive shaking of R arm resulting in more reduced stability and patient requesting return to supine position     General Comments        Pertinent Vitals/Pain Pain Assessment: 0-10 Pain Score: 2  Pain Location: reports some back pain with LE movement Pain Descriptors / Indicators: Aching;Discomfort Pain Intervention(s): Limited activity within patient's tolerance;Repositioned;Monitored during session    Home Living                      Prior Function            PT Goals (current goals can now be found in the care plan section) Acute Rehab PT Goals Patient Stated Goal: to increase strength and mobility PT Goal Formulation: With patient Time For Goal Achievement: 04/02/19 Potential to Achieve Goals: Fair Progress towards PT goals: Not progressing toward goals - comment(increased assistance EOB today)    Frequency    Min 2X/week      PT Plan Current plan remains appropriate    Co-evaluation              AM-PAC PT "6 Clicks" Mobility   Outcome Measure  Help needed turning from your back to your side while in a flat bed without using bedrails?: A Lot Help needed moving from lying on your back to sitting on the side of a flat bed without using bedrails?: A Lot Help needed moving to and from a bed to a chair (including a wheelchair)?: Total Help needed standing up from a chair using your arms (e.g., wheelchair or bedside chair)?: Total Help needed to walk in hospital room?: Total Help needed climbing 3-5 steps with a railing? : Total 6 Click Score: 8    End of Session Equipment Utilized During Treatment: Gait belt Activity Tolerance: Patient tolerated treatment well Patient left: in bed;with call bell/phone within reach;with bed alarm set Nurse Communication: Mobility status;Precautions(shaking of R arm in  seated EOB) PT Visit Diagnosis: Unsteadiness on feet (R26.81);Other abnormalities of gait and mobility (R26.89);Adult, failure to thrive (R62.7)     Time: 2694-8546 PT Time Calculation (min) (ACUTE ONLY): 31 min  Charges:  $Therapeutic Exercise: 8-22 mins $Therapeutic Activity: 8-22 mins                     Precious Bard, PT, DPT     Precious Bard 03/25/2019, 3:55 PM

## 2019-03-25 NOTE — Progress Notes (Signed)
Nutrition Follow-up  RD working remotely.  DOCUMENTATION CODES:   Non-severe (moderate) malnutrition in context of social or environmental circumstances  INTERVENTION:  Continue Ensure Enlive po BID, each supplement provides 350 kcal and 20 grams of protein.  NUTRITION DIAGNOSIS:   Moderate Malnutrition related to social / environmental circumstances as evidenced by mild fat depletion, moderate fat depletion, mild muscle depletion, moderate muscle depletion.  Ongoing - addressing with nutrition interventions.  GOAL:   Patient will meet greater than or equal to 90% of their needs  Progressing - likely met but unable to estimate calorie/protein intake working remotely.  MONITOR:   PO intake, Supplement acceptance, Weight trends, I & O's  REASON FOR ASSESSMENT:   LOS    ASSESSMENT:   57-year-old male who presented to the ED on 3/10 with AMS. PMH of stroke, HTN, memory loss. Pt admitted with sepsis.  Patient continues to have good appetite. He is still eating 100% of meals and drinking Ensure Enlive 1-2 bottles per day. Per chart patient may be able to be placed later this week.  Medications reviewed and include: Eliquis, folic acid 1 mg daily PO, Remeron 15 mg QHS, MVI daily, Miralax 17 grams daily, Flomax, thiamine 100 mg daily.  Labs reviewed.  Diet Order:   Diet Order            Diet - low sodium heart healthy        Diet regular Room service appropriate? Yes; Fluid consistency: Thin  Diet effective now        Diet - low sodium heart healthy             EDUCATION NEEDS:   Education needs have been addressed  Skin:  Skin Assessment: Reviewed RN Assessment  Last BM:  03/24/2019 - large type 4  Height:   Ht Readings from Last 1 Encounters:  01/20/19 5' 11" (1.803 m)   Weight:   Wt Readings from Last 1 Encounters:  02/12/19 87.6 kg   Ideal Body Weight:  78.2 kg  BMI:  Body mass index is 26.93 kg/m.  Estimated Nutritional Needs:   Kcal:   2150-2350  Protein:  110-125 grams  Fluid:  >/= 2.2 L   Stephens, MS, RD, LDN Office: [phone] Pager: [phone] After Hours/Weekend Pager: [phone]  

## 2019-03-26 NOTE — Progress Notes (Signed)
Physical Therapy Treatment Patient Details Name: Maxwell Miller MRN: [id number] DOB: Aug 26, 1961 Today's Date: 03/26/2019    History of Present Illness 58 y.o. male who [facility] with AMS, confusion, and fever, admitted with sepsis and related encephalopathy. 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, pt hd also made good progress working with psychiatry.  PMHx: portal vein thrombosis, CVA, HTN, erythrocytosis.     PT Comments    Patient in bed eating chips upon PT entering room. Agreeable to PT, stating he is glad to do something different as he is feeling depressed. Patient had noted muscle contraction with df overpressure and quad quick stretch. Patient unable to maintain seated EOB position for as long as previous sessions due to excessive shaking of UE's (R>L). Patient momentarily could support self with SBA however quickly devolved to Mod A with pushing into R/posterior direction. Patient returned to supine position and repositioned in bed. Current POC remains appropriate at this time.     Follow Up Recommendations  SNF     Equipment Recommendations  Wheelchair (measurements PT);Wheelchair cushion (measurements PT);3in1 (PT);Other (comment)(hoyer lift)    Recommendations for Other Services       Precautions / Restrictions Precautions Precautions: Fall Restrictions Weight Bearing Restrictions: No    Mobility  Bed Mobility Overal bed mobility: Needs Assistance Bed Mobility: Supine to Sit;Sit to Supine     Supine to sit: Mod assist;HOB elevated Sit to supine: Mod assist;HOB elevated   General bed mobility comments: mod assist for trunk and B LE's, patient requiring firm simple commands. Needs assistance bringing legs off and onto bed. excessive shaking of R UE with  sitting EOB  Transfers                 General transfer comment: not able to be attempted. patient shaking and declined further mobility   Ambulation/Gait                 Stairs             Wheelchair Mobility    Modified Rankin (Stroke Patients Only)       Balance Overall balance assessment: Needs assistance Sitting-balance support: Bilateral upper extremity supported;Feet supported Sitting balance-Leahy Scale: Poor Sitting balance - Comments: SBA to Mod A to correct R lateral lean and excessive shaking of BUE (R worse than L) Postural control: Right lateral lean;Posterior lean                                  Cognition Arousal/Alertness: Awake/alert Behavior During Therapy: Flat affect Overall Cognitive Status: No family/caregiver present to determine baseline cognitive functioning Area of Impairment: Memory;Attention                   Current Attention Level: Selective Memory: Decreased recall of precautions;Decreased short-term memory Following Commands: Follows one step commands inconsistently Safety/Judgement: Decreased awareness of safety   Problem Solving: Slow processing;Requires verbal cues;Requires tactile cues General Comments: Patient is self limiting, becomes philosophical and verbose to attempt to redirect away from mobility       Exercises General Exercises - Lower Extremity Ankle Circles/Pumps: AAROM;15 reps;AROM;Both(tactile cueing with quick stretch for RLE) Quad Sets: AROM;Left;15 reps;AAROM;Right(quickstretch muscle activation performed) Gluteal Sets: AROM;Both;10 reps;Supine Short Arc Quad: AAROM;Right;Both;15 reps Heel Slides:  Both;AAROM;Strengthening;15 reps(AAROM with PT assist RLE, AROM LLE) Hip ABduction/ADduction: AAROM;AROM;Both;15 reps(AAROM only on R) Straight Leg Raises: AAROM;Both;15 reps(AAROM BLE) Other Exercises Other Exercises: bridging in supine position with PT assitance for LE  stabilization, max cueing for encouragement, limited ability to clear bed however noted bilateral muscle contraction 10x, LE rotation in hooklying for low back muscle tension release Other Exercises: seated trunk control sitting EOB, terminated with excessive shaking of R arm resulting in more reduced stability and patient requesting return to supine position     General Comments        Pertinent Vitals/Pain Pain Assessment: No/denies pain    Home Living                      Prior Function            PT Goals (current goals can now be found in the care plan section) Acute Rehab PT Goals Patient Stated Goal: to increase strength and mobility PT Goal Formulation: With patient Time For Goal Achievement: 04/02/19 Potential to Achieve Goals: Fair Progress towards PT goals: Not progressing toward goals - comment(inconsistant progress)    Frequency    Min 2X/week      PT Plan Current plan remains appropriate    Co-evaluation              AM-PAC PT "6 Clicks" Mobility   Outcome Measure  Help needed turning from your back to your side while in a flat bed without using bedrails?: A Lot Help needed moving from lying on your back to sitting on the side of a flat bed without using bedrails?: A Lot Help needed moving to and from a bed to a chair (including a wheelchair)?: Total Help needed standing up from a chair using your arms (e.g., wheelchair or bedside chair)?: Total Help needed to walk in hospital room?: Total Help needed climbing 3-5 steps with a railing? : Total 6 Click Score: 8    End of Session Equipment Utilized During Treatment: Gait belt Activity Tolerance: Patient tolerated treatment well Patient left: in bed;with call bell/phone within reach;with bed alarm set Nurse Communication: Mobility status;Precautions(shaking of R arm in seated EOB) PT Visit Diagnosis: Unsteadiness on feet (R26.81);Other abnormalities of gait and mobility (R26.89);Adult,  failure to thrive (R62.7)     Time: 7622-6333 PT Time Calculation (min) (ACUTE ONLY): 24 min  Charges:  $Therapeutic Exercise: 8-22 mins $Therapeutic Activity: 8-22 mins                    Precious Bard, PT, DPT     Precious Bard 03/26/2019, 3:26 PM

## 2019-03-26 NOTE — Progress Notes (Signed)
PT Cancellation Note  Patient Details Name: Maxwell Miller MRN: [id number] DOB: [birth year]   Cancelled Treatment:    Reason Eval/Treat Not Completed: Other (comment)(patient having a bowel movement. Will attempt again at later time/date. )  Precious Bard, PT, DPT   03/26/2019, 1:48 PM

## 2019-03-26 NOTE — Progress Notes (Signed)
   03/26/19 1300  Clinical Encounter Type  Visited With Patient  Visit Type Follow-up   Chaplain received a referral for a visit from the patient's nurse because he has had an extended hospital stay (65 days) and seems to be a bit down. Upon arrival, the patient was using the restroom; will follow up later.

## 2019-03-26 NOTE — Progress Notes (Signed)
OT Cancellation Note  Patient Details Name: Maxwell Miller MRN: [id number] DOB: 1961/04/16   Cancelled Treatment:    Reason Eval/Treat Not Completed: Patient at procedure or test/ unavailable(Pt. in the process of using the bedpan. WIll reattempt at a later time/date.)  Olegario Messier, MS, OTR/L 03/26/2019, 1:20 PM

## 2019-03-26 NOTE — Progress Notes (Signed)
[employer] -  at [facility]   PATIENT NAME: Maxwell Miller    MR#:  [id number]  DATE OF BIRTH:  November 21, 1960  SUBJECTIVE:  CHIEF COMPLAINT:   Chief Complaint  Patient presents with  . Altered Mental Status   -Awaiting disposition to a rehab. -Has been having right lower extremity weakness for a few days now.  He says he notices involuntary movement in that leg.  REVIEW OF SYSTEMS:  Review of Systems  Unable to perform ROS: Dementia    DRUG ALLERGIES:  No Known Allergies  VITALS:  Blood pressure 128/84, pulse 72, temperature 98 F (36.7 C), temperature source Oral, resp. rate 16, height 5\' 11"  (1.803 m), weight 87.6 kg, SpO2 97 %.  PHYSICAL EXAMINATION:  Physical Exam   GENERAL:  58 y.o.-year-old patient lying in the bed with no acute distress.  EYES: Pupils equal, round, reactive to light and accommodation. No scleral icterus. Extraocular muscles intact.  HEENT: Head atraumatic, normocephalic. Oropharynx and nasopharynx clear.  NECK:  Supple, no jugular venous distention. No thyroid enlargement, no tenderness.  LUNGS: Normal breath sounds bilaterally, no wheezing, rales,rhonchi or crepitation. No use of accessory muscles of respiration.  CARDIOVASCULAR: S1, S2 normal. No murmurs, rubs, or gallops.  ABDOMEN: Soft, nontender, nondistended. Bowel sounds present. No organomegaly or mass.  EXTREMITIES: No pedal edema, cyanosis, or clubbing.  NEUROLOGIC: Cranial nerves II through XII are intact. Muscle strength 5/5 in all extremities except right lower extremity, it is 1/5.  Has hyperreflexia in right lower extremity.  Sensation intact. Gait not checked.  PSYCHIATRIC: The patient is alert but disoriented SKIN: No obvious rash, lesion, or ulcer.    LABORATORY PANEL:   CBC Recent Labs  Lab 03/23/19 0513  WBC 7.6  HGB 16.0  HCT 48.2  PLT 197    ------------------------------------------------------------------------------------------------------------------  Chemistries  Recent Labs  Lab 03/25/19 0418  CREATININE 1.06   ------------------------------------------------------------------------------------------------------------------  Cardiac Enzymes No results for input(s): TROPONINI in the last 168 hours. ------------------------------------------------------------------------------------------------------------------  RADIOLOGY:  No results found.  EKG:   Orders placed or performed during the hospital encounter of 01/20/19  . EKG 12-Lead  . EKG 12-Lead  . ED EKG 12-Lead  . ED EKG 12-Lead    ASSESSMENT AND PLAN:   58 year old male with history of intracranial hemorrhage x2, short-term memory loss and erythrocytosis who presents with generalized weakness, confusion and fever almost 60 days ago.  *New right lower extremity weakness-with involuntary movements.  Concern for psychological weakness. -Previous stroke work-up has been negative.  Last CT head was 3 weeks ago.  *Acute Sepsis - on admission-  due to proctitis and now resolved. [name] in the blood on 01/20/19 Completed course of antibiotics with p.o. Bactrim - currently afebrile, hemodynamically stable.   *Confusion, short-term memory loss ??Had rapidly progressive dementia - ? Underlying vascular dementia as well. 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 No acute findings or no features of meningitis seen. - mental status currently at baseline.   *depression - improved. Continue Remeron   *Portal vein thrombosis -Continue eliquis. No acute issues  *Generalized weakness PT recommending SNF/STR but pt. Is awaiting guardianship.   *Hypertension, chronic Continue Norvasc  Patient awaiting legal guardianship and has no safe disposition presently. will need rehab at  discharge. - Adult Protective Services have been identified. Await further disposition guidance as per social work.      All the records are reviewed and case discussed with Care  Management/Social Workerr. Management plans discussed with the patient, family and they are in agreement.  CODE STATUS: Full code  TOTAL TIME TAKING CARE OF THIS PATIENT: 38 minutes.   POSSIBLE D/C IN ? DAYS, DEPENDING ON CLINICAL CONDITION.   Enid Baas M.D on 03/26/2019 at 10:19 AM  Between 7am to 6pm - Pager - [phone]  After 6pm go to [email or web address] - Social research officer, government  [facility] [place]  Office  [phone]  CC: Primary care physician; Mable Paris, PA-C

## 2019-03-27 NOTE — Progress Notes (Signed)
Physical Therapy Treatment Patient Details Name: Anola GurneyHansy Vetrano MRN: [id number] DOB: 1961-06-07 Today's Date: 03/27/2019    History of Present Illness 58 y.o. male who [facility]19 with AMS, confusion, and fever, admitted with sepsis and related encephalopathy. 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, pt hd also made good progress working with psychiatry.  PMHx: portal vein thrombosis, CVA, HTN, erythrocytosis.     PT Comments    Patient required heavy encourangement to participate in PT session to work towards standing and potential walking. Patient educated on bed mobility with using LLE to advance RLE off bed. He exhibits heavy right posterior lean upon sitting edge of bed initially requiring mod A +1 for sitting balance. However following instruction he was able to progress to sitting supervision for short duration. Patient instructed in sit<>stand transfer with RW with cues for hand placement and to improve forward weight shift for better transfer ability; Patient tolerated fair. He was abel to exhibit better posture with less lean following transfer. He would benefit from additional skilled PT Intervention to improve strength, balance and gait safety; Current plan remains appropriate. Patient left in bed, bed alarm on, needs in reach;    Follow Up Recommendations  SNF     Equipment Recommendations  Wheelchair (measurements PT);Wheelchair cushion (measurements PT);3in1 (PT);Other (comment)(hoyer lift)    Recommendations for Other Services       Precautions / Restrictions Precautions Precautions: Fall Restrictions Weight Bearing Restrictions: No    Mobility  Bed Mobility Overal bed mobility: Needs Assistance Bed Mobility: Supine to Sit;Sit to Supine     Supine to sit:  Mod assist;HOB elevated Sit to supine: Min assist;HOB elevated   General bed mobility comments: patient educated on using LLE to advance RLE out of bed as patient unable to advance RLE independently; Patient requires mod A for pulling self up to sitting edge of bed with cues for hand placement and trunk positioning; Patient initially exhibits increased posterior lean with poor trunk control;   Transfers Overall transfer level: Needs assistance Equipment used: Rolling walker (2 wheeled) Transfers: Sit to/from Stand Sit to Stand: Mod assist        Lateral/Scoot Transfers: Mod assist General transfer comment: able to transfer sit<>Stand from elevated bed with RW with cues for hand placement and to improve forward trunk lean for better transfer ability; Requires mod A +1 x2 reps; Upon standing, patient able to scoot to right slightly for better positioning in bed;   Ambulation/Gait             General Gait Details: not able at this time;    Stairs             Wheelchair Mobility    Modified Rankin (Stroke Patients Only)       Balance Overall balance assessment: Needs assistance Sitting-balance support: Bilateral upper extremity supported;Feet supported;No upper extremity supported Sitting balance-Leahy Scale: Poor Sitting balance - Comments: Initially patient required mod A for sitting balance unsupported with heavy right/posterior lean; Instructed patient in sitting position and balance with tactile cues for neutral position. Following repeated education and unsupported sitting, patient able to sit unsupported with CGA to close supervision with neutral position;  Postural control: Right lateral lean;Posterior lean   Standing balance-Leahy Scale: Poor Standing balance comment: Requires RW and min-mod A for standing balance;  Cognition Arousal/Alertness: Awake/alert Behavior During Therapy: Flat affect Overall Cognitive Status:  No family/caregiver present to determine baseline cognitive functioning Area of Impairment: Memory;Attention                   Current Attention Level: Selective Memory: Decreased recall of precautions;Decreased short-term memory Following Commands: Follows one step commands inconsistently Safety/Judgement: Decreased awareness of safety   Problem Solving: Slow processing;Requires verbal cues;Requires tactile cues General Comments: Patient is self limiting, becomes philosophical and verbose to attempt to redirect away from mobility       Exercises Other Exercises Other Exercises: Instructed patient sitting balance exercise to address sitting posture x10 min; see balance; patient able to progress to sitting edge of bed, supervision with better positioning and less lateral lean following instruction;  Other Exercises: following transfer patient able to sit edge of bed without loss of balance supervision with better trunk control;     General Comments        Pertinent Vitals/Pain Pain Assessment: No/denies pain Faces Pain Scale: No hurt Pain Location: pt reports, "its about the same"  Pain Intervention(s): Limited activity within patient's tolerance;Monitored during session    Home Living Family/patient expects to be discharged to:: Unsure Living Arrangements: Other (Comment)(Pt states he is unable to remember living arragements. )                  Prior Function        Comments: Pt with memory loss. Is unable to recall PLOF, although on this date he states he remembers driving "at one time". Does not recall ADL/IADLs prior to hospital admission.    PT Goals (current goals can now be found in the care plan section) Acute Rehab PT Goals Patient Stated Goal: to increase strength and mobility PT Goal Formulation: With patient Time For Goal Achievement: 04/02/19 Potential to Achieve Goals: Fair Progress towards PT goals: Not progressing toward goals -  comment(inconsistent)    Frequency    Min 2X/week      PT Plan Current plan remains appropriate    Co-evaluation              AM-PAC PT "6 Clicks" Mobility   Outcome Measure  Help needed turning from your back to your side while in a flat bed without using bedrails?: A Lot Help needed moving from lying on your back to sitting on the side of a flat bed without using bedrails?: A Lot Help needed moving to and from a bed to a chair (including a wheelchair)?: Total Help needed standing up from a chair using your arms (e.g., wheelchair or bedside chair)?: Total Help needed to walk in hospital room?: Total Help needed climbing 3-5 steps with a railing? : Total 6 Click Score: 8    End of Session Equipment Utilized During Treatment: Gait belt Activity Tolerance: Patient tolerated treatment well Patient left: in bed;with call bell/phone within reach;with bed alarm set Nurse Communication: Mobility status;Precautions(shaking of R arm in seated EOB) PT Visit Diagnosis: Unsteadiness on feet (R26.81);Other abnormalities of gait and mobility (R26.89);Adult, failure to thrive (R62.7)     Time: 3009-2330 PT Time Calculation (min) (ACUTE ONLY): 42 min  Charges:  $Therapeutic Activity: 23-37 mins $Neuromuscular Re-education: 8-22 mins                        Domonick Sittner PT, DPT 03/27/2019, 4:29 PM

## 2019-03-27 NOTE — Progress Notes (Signed)
OT Cancellation Note  Patient Details Name: Maxwell Miller MRN: [id number] DOB: 02/05/1961   Cancelled Treatment:    Reason Eval/Treat Not Completed: Other (comment). Pt with PT. Will re-attempt at later date/time as pt is available.   Richrd Prime, MPH, MS, OTR/L ascom [phone] 03/27/19, 4:14 PM

## 2019-03-27 NOTE — Progress Notes (Signed)
[employer] - [place] at [facility]   PATIENT NAME: Josai Hoverson    MR#:  [id number]  DATE OF BIRTH:  1961-11-04  SUBJECTIVE:  CHIEF COMPLAINT:   Chief Complaint  Patient presents with  . Altered Mental Status   -Has upper extremity tremors and complains of right leg weakness.  No further change  REVIEW OF SYSTEMS:  Review of Systems  Unable to perform ROS: Dementia    DRUG ALLERGIES:  No Known Allergies  VITALS:  Blood pressure 138/86, pulse 79, temperature 97.8 F (36.6 C), temperature source Oral, resp. rate 17, height 5\' 11"  (1.803 m), weight 87.6 kg, SpO2 97 %.  PHYSICAL EXAMINATION:  Physical Exam   GENERAL:  58 y.o.-year-old patient lying in the bed with no acute distress.  EYES: Pupils equal, round, reactive to light and accommodation. No scleral icterus. Extraocular muscles intact.  HEENT: Head atraumatic, normocephalic. Oropharynx and nasopharynx clear.  NECK:  Supple, no jugular venous distention. No thyroid enlargement, no tenderness.  LUNGS: Normal breath sounds bilaterally, no wheezing, rales,rhonchi or crepitation. No use of accessory muscles of respiration.  CARDIOVASCULAR: S1, S2 normal. No murmurs, rubs, or gallops.  ABDOMEN: Soft, nontender, nondistended. Bowel sounds present. No organomegaly or mass.  EXTREMITIES: No pedal edema, cyanosis, or clubbing.  NEUROLOGIC: Cranial nerves II through XII are intact. Muscle strength 5/5 in all extremities except right lower extremity, it is 1/5.  Has hyperreflexia in right lower extremity.  Has significant upper extremity tremors.  Sensation intact. Gait not checked.  PSYCHIATRIC: The patient is alert but disoriented SKIN: No obvious rash, lesion, or ulcer.    LABORATORY PANEL:   CBC Recent Labs  Lab 03/23/19 0513  WBC 7.6  HGB 16.0  HCT 48.2  PLT 197   ------------------------------------------------------------------------------------------------------------------  Chemistries   Recent Labs  Lab 03/25/19 0418  CREATININE 1.06   ------------------------------------------------------------------------------------------------------------------  Cardiac Enzymes No results for input(s): TROPONINI in the last 168 hours. ------------------------------------------------------------------------------------------------------------------  RADIOLOGY:  No results found.  EKG:   Orders placed or performed during the hospital encounter of 01/20/19  . EKG 12-Lead  . EKG 12-Lead  . ED EKG 12-Lead  . ED EKG 12-Lead    ASSESSMENT AND PLAN:   58 year old male with history of intracranial hemorrhage x2, short-term memory loss and erythrocytosis who presents with generalized weakness, confusion and fever almost 60 days ago.  *New right lower extremity weakness-with involuntary movements.  Concern for psychological weakness. -Previous stroke work-up has been negative.  Last CT head was 3 weeks ago.  *Acute Sepsis - on admission-  due to proctitis and now resolved. [name] in the blood on 01/20/19 Completed course of antibiotics with p.o. Bactrim - currently afebrile, hemodynamically stable.   *Confusion, short-term memory loss ??Had rapidly progressive dementia - ? Underlying vascular dementia as well. 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 No acute findings or no features of meningitis seen. - mental status currently at baseline.   *depression - improved. Continue Remeron   *Portal vein thrombosis -Continue eliquis. No acute issues  *Generalized weakness PT recommending SNF/STR    *Hypertension, chronic Continue Norvasc   -Patient has a place that has accepted to take him in [place] under letter of guarantee.  Awaiting to hear from Child psychotherapist.      All the records are reviewed and case discussed with Care Management/Social Workerr. Management plans discussed with  the patient, family and they are in agreement.  CODE STATUS:  Full code  TOTAL TIME TAKING CARE OF THIS PATIENT: 38 minutes.   POSSIBLE D/C IN ? DAYS, DEPENDING ON CLINICAL CONDITION.   Enid Baasadhika Yaeli Hartung M.D on 03/27/2019 at 11:17 AM  Between 7am to 6pm - Pager - [phone]  After 6pm go to [email or web address] - Social research officer, governmentpassword [facility]  [facility] [place] [facility]  Office  [phone]  CC: Primary care physician; Mable ParisWilliams, Katrina, PA-C

## 2019-03-28 NOTE — Progress Notes (Signed)
[employer] [facility] - [place] at [facility]   PATIENT NAME: Maxwell Miller    MR#:  [id number]  DATE OF BIRTH:  10/16/1961  SUBJECTIVE:  CHIEF COMPLAINT:   Chief Complaint  Patient presents with  . Altered Mental Status   No new complaints morning.  Patient was resting comfortably.  No fevers.  REVIEW OF SYSTEMS:  ROS Unobtainable due to underlying dementia. DRUG ALLERGIES:  No Known Allergies VITALS:  Blood pressure (!) 138/94, pulse 77, temperature 98 F (36.7 C), resp. rate 19, height 5\' 11"  (1.803 m), weight 87.6 kg, SpO2 98 %. PHYSICAL EXAMINATION:  Physical Exam  GENERAL:  58 y.o.-year-old patient lying in the bed with no acute distress.  EYES: Pupils equal, round, reactive to light and accommodation. No scleral icterus. Extraocular muscles intact.  HEENT: Head atraumatic, normocephalic. Oropharynx and nasopharynx clear.  NECK:  Supple, no jugular venous distention. No thyroid enlargement, no tenderness.  LUNGS: Normal breath sounds bilaterally, no wheezing, rales,rhonchi or crepitation. No use of accessory muscles of respiration.  CARDIOVASCULAR: S1, S2 normal. No murmurs, rubs, or gallops.  ABDOMEN: Soft, nontender, nondistended. Bowel sounds present. No organomegaly or mass.  EXTREMITIES: No pedal edema, cyanosis, or clubbing.  NEUROLOGIC:  Muscle strength 5/5 in all extremities except right lower extremity, it is 1/5.  Has hyperreflexia in right lower extremity.  Has significant upper extremity tremors.  Sensation intact. Gait not checked.  PSYCHIATRIC: The patient is alert but disoriented SKIN: No obvious rash, lesion, or ulcer.  LABORATORY PANEL:  Male CBC Recent Labs  Lab 03/23/19 0513  WBC 7.6  HGB 16.0  HCT 48.2  PLT 197   ------------------------------------------------------------------------------------------------------------------ Chemistries  Recent Labs  Lab 03/25/19 0418  CREATININE 1.06   RADIOLOGY:  No results found.  ASSESSMENT AND PLAN:   58 year old male with history of intracranial hemorrhage x2, short-term memory loss and erythrocytosis who presents with generalized weakness, confusion and fever almost 60 days ago.  *New right lower extremity weakness-with involuntary movements.  Concern for psychological weakness. -Previous stroke work-up has been negative.  Last CT head was 3 weeks ago.  *Acute Sepsis - on admission- due to proctitis and now resolved. [name] in the blood on 01/20/19 Completed course of antibiotics with p.o. Bactrim - currently afebrile, hemodynamically stable.   *Confusion, short-term memory loss ??Had rapidly progressive dementia - ? Underlying vascular dementia as well. 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 No acute findings or no features of meningitis seen. - mental status currently at baseline.   *depression - improved. Continue Remeron   *Portal vein thrombosis -Continue eliquis. No acute issues  *Generalized weakness PT recommending SNF/STR    *Hypertension, chronic Continue Norvasc  -Patient has a place that has accepted to take him in [place] under letter of guarantee.  Awaiting to hear from Child psychotherapistsocial worker.   All the records are reviewed and case discussed with Care Management/Social Worker. Management plans discussed with the patient, family and they are in agreement.  CODE STATUS: Full Code  TOTAL TIME TAKING CARE OF THIS PATIENT: 37 minutes.   More than 50% of the time was spent in counseling/coordination of care: YES  POSSIBLE D/C IN 2 DAYS, DEPENDING ON CLINICAL CONDITION.   Ravleen Ries M.D on 03/28/2019 at 1:15 PM  Between 7am to 6pm - Pager - [phone]  After 6pm go to [email or web address] - Therapist, nutritionalpassword EPAS [facility]  [facility] [place] [facility]  Office  [phone]  CC: Primary care  physician; Mable Paris, PA-C  Note: This dictation was  prepared with Dragon dictation along with smaller phrase technology. Any transcriptional errors that result from this process are unintentional.

## 2019-03-29 NOTE — Progress Notes (Signed)
OT Cancellation Note  Patient Details Name: Maxwell Miller MRN: [id number] DOB: May 07, 1961   Cancelled Treatment:    Reason Eval/Treat Not Completed: Patient declined, no reason specified. OT attempted to see this pt for tx on this date. Upon arrival to room, pt asleep in bed. Awoke to VC's. Pt endorsed feeling sleepy. Declined to participate in OT tx at this time. Will re-attempt at a later time/date as available and pt medically appropriate for OT tx.   Rockney Ghee, M.S., OTR/L Ascom: [phone] 03/29/19, 1:26 PM    [name] 03/29/2019, 1:25 PM

## 2019-03-29 NOTE — Progress Notes (Signed)
[employer] - [place] at [facility]   PATIENT NAME: Maxwell Miller    MR#:  [id number]  DATE OF BIRTH:  06-02-1961  SUBJECTIVE:  CHIEF COMPLAINT:   Chief Complaint  Patient presents with  . Altered Mental Status   No new complaints morning.  Patient was resting comfortably.  No fevers.  REVIEW OF SYSTEMS:  ROS Unobtainable due to underlying dementia. DRUG ALLERGIES:  No Known Allergies VITALS:  Blood pressure 132/87, pulse 71, temperature 98 F (36.7 C), temperature source Oral, resp. rate 18, height 5\' 11"  (1.803 m), weight 87.6 kg, SpO2 96 %. PHYSICAL EXAMINATION:  Physical Exam  GENERAL:  58 y.o.-year-old patient lying in the bed with no acute distress.  EYES: Pupils equal, round, reactive to light and accommodation. No scleral icterus. Extraocular muscles intact.  HEENT: Head atraumatic, normocephalic. Oropharynx and nasopharynx clear.  NECK:  Supple, no jugular venous distention. No thyroid enlargement, no tenderness.  LUNGS: Normal breath sounds bilaterally, no wheezing, rales,rhonchi or crepitation. No use of accessory muscles of respiration.  CARDIOVASCULAR: S1, S2 normal. No murmurs, rubs, or gallops.  ABDOMEN: Soft, nontender, nondistended. Bowel sounds present. No organomegaly or mass.  EXTREMITIES: No pedal edema, cyanosis, or clubbing.  NEUROLOGIC:  Muscle strength 5/5 in all extremities except right lower extremity, it is 1/5.  Has hyperreflexia in right lower extremity.  Has significant upper extremity tremors.  Sensation intact. Gait not checked.  PSYCHIATRIC: The patient is alert but disoriented SKIN: No obvious rash, lesion, or ulcer.  LABORATORY PANEL:  Male CBC Recent Labs  Lab 03/23/19 0513  WBC 7.6  HGB 16.0  HCT 48.2  PLT 197   ------------------------------------------------------------------------------------------------------------------ Chemistries  Recent Labs  Lab 03/25/19 0418  CREATININE 1.06   RADIOLOGY:  No  results found. ASSESSMENT AND PLAN:   58 year old male with history of intracranial hemorrhage x2, short-term memory loss and erythrocytosis who presents with generalized weakness, confusion and fever   *New right lower extremity weakness-with involuntary movements.  Concern for psychological weakness.  Improved. -Previous stroke work-up has been negative.  Last CT head was 3 weeks ago.  *Acute Sepsis - on admission- due to proctitis and now resolved. [name] in the blood on 01/20/19 Completed course of antibiotics with p.o. Bactrim - currently afebrile, hemodynamically stable.   *Confusion, short-term memory loss ??Had rapidly progressive dementia - ? Underlying vascular dementia as well. 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 No acute findings or no features of meningitis seen. - mental status currently at baseline.   *depression - improved. Continue Remeron   *Portal vein thrombosis -Continue eliquis. No acute issues  *Generalized weakness PT recommending SNF/STR    *Hypertension, chronic Continue Norvasc  -Patient has a place that has accepted to take him in [place] under letter of guarantee.  Awaiting to hear from Child psychotherapist.   All the records are reviewed and case discussed with Care Management/Social Worker. Management plans discussed with the patient, family and they are in agreement.  CODE STATUS: Full Code  TOTAL TIME TAKING CARE OF THIS PATIENT: 35 minutes.   More than 50% of the time was spent in counseling/coordination of care: YES  POSSIBLE D/C IN 1-2 DAYS, DEPENDING ON CLINICAL CONDITION.   Kodah Maret M.D on 03/29/2019 at 12:05 PM  Between 7am to 6pm - Pager - [phone]  After 6pm go to [email or web address] - Therapist, nutritional Hospitalists  Office  [phone]  CC: Primary  care physician; Mable ParisWilliams, Katrina, PA-C  Note: This  dictation was prepared with Dragon dictation along with smaller phrase technology. Any transcriptional errors that result from this process are unintentional.

## 2019-03-30 LAB — CBC
HCT: 47.5 % (ref 39.0–52.0)
Hemoglobin: 15.8 g/dL (ref 13.0–17.0)
MCH: 29.1 pg (ref 26.0–34.0)
MCHC: 33.3 g/dL (ref 30.0–36.0)
MCV: 87.5 fL (ref 80.0–100.0)
Platelets: 198 10*3/uL (ref 150–400)
RBC: 5.43 MIL/uL (ref 4.22–5.81)
RDW: 16.2 % — ABNORMAL HIGH (ref 11.5–15.5)
WBC: 6.9 10*3/uL (ref 4.0–10.5)
nRBC: 0 % (ref 0.0–0.2)

## 2019-03-30 LAB — BASIC METABOLIC PANEL
Anion gap: 8 (ref 5–15)
BUN: 15 mg/dL (ref 6–20)
CO2: 24 mmol/L (ref 22–32)
Calcium: 8.4 mg/dL — ABNORMAL LOW (ref 8.9–10.3)
Chloride: 102 mmol/L (ref 98–111)
Creatinine, Ser: 1.11 mg/dL (ref 0.61–1.24)
GFR calc Af Amer: >60 mL/min (ref >60)
GFR calc non Af Amer: >60 mL/min (ref >60)
Glucose, Bld: 311 mg/dL — ABNORMAL HIGH (ref 70–99)
Potassium: 4 mmol/L (ref 3.5–5.1)
Sodium: 134 mmol/L — ABNORMAL LOW (ref 135–145)

## 2019-03-30 LAB — MAGNESIUM: Magnesium: 2.1 mg/dL (ref 1.7–2.4)

## 2019-03-30 NOTE — TOC Progression Note (Signed)
Transition of Care [facility]) - Progression Note    Patient Details  Name: Maxwell Miller MRN: [id number] Date of Birth: [birth year]  Transition of Care [facility]) CM/SW Contact  Ruthe Mannan, [place] Phone Number: 03/30/2019, 3:08 PM  Clinical Narrative:   CSW got LOG approved for patient for 30 days at [facility] in [place]. CSW awaiting confirmation from SNF and when a bed will be available. CSW will continue to follow for discharge planning.     Expected Discharge Plan: Home w Home Health Services Barriers to Discharge: Family Issues, Transportation  Expected Discharge Plan and Services Expected Discharge Plan: Home w Home Health Services   Discharge Planning Services: CM Consult Post Acute Care Choice: Home Health, Durable Medical Equipment   Expected Discharge Date: 03/22/19                         HH Arranged: RN, PT, Nurse's Aide HH Agency: [facility]         Social Determinants of Health (SDOH) Interventions    Readmission Risk Interventions No flowsheet data found.

## 2019-03-30 NOTE — Progress Notes (Addendum)
PT Cancellation Note  Patient Details Name: Maxwell Miller MRN: [id number] DOB: Mar 25, 1961   Cancelled Treatment:    Reason Eval/Treat Not Completed: Fatigue/lethargy limiting ability to participate Chart reviewed. Nursing consulted. Patient sleeping on PT arrival. Patient awakens to loud auditory stimulus but declines to participate in therapy. PT will follow-up at a later time/date.  Sheria Lang PT, DPT [phone] 03/30/2019, 3:26 PM

## 2019-03-30 NOTE — Progress Notes (Signed)
[employer] [facility] - [place] at [facility]   PATIENT NAME: Maxwell Miller    MR#:  [id number]  DATE OF BIRTH:  December 10, 1960  SUBJECTIVE:  CHIEF COMPLAINT:   Chief Complaint  Patient presents with  . Altered Mental Status   No new complaints morning.  No fevers.  REVIEW OF SYSTEMS:  ROS Unobtainable due to underlying dementia. DRUG ALLERGIES:  No Known Allergies VITALS:  Blood pressure 137/79, pulse 77, temperature 98.5 F (36.9 C), temperature source Oral, resp. rate 19, height 5\' 11"  (1.803 m), weight 87.6 kg, SpO2 96 %. PHYSICAL EXAMINATION:  Physical Exam  GENERAL:  58 y.o.-year-old patient lying in the bed with no acute distress.  EYES: Pupils equal, round, reactive to light and accommodation. No scleral icterus. Extraocular muscles intact.  HEENT: Head atraumatic, normocephalic. Oropharynx and nasopharynx clear.  NECK:  Supple, no jugular venous distention. No thyroid enlargement, no tenderness.  LUNGS: Normal breath sounds bilaterally, no wheezing, rales,rhonchi or crepitation. No use of accessory muscles of respiration.  CARDIOVASCULAR: S1, S2 normal. No murmurs, rubs, or gallops.  ABDOMEN: Soft, nontender, nondistended. Bowel sounds present. No organomegaly or mass.  EXTREMITIES: No pedal edema, cyanosis, or clubbing.  NEUROLOGIC:  Muscle strength 5/5 in all extremities except right lower extremity, it is 1/5.  Has hyperreflexia in right lower extremity.  Has significant upper extremity tremors.  Sensation intact. Gait not checked.  PSYCHIATRIC: The patient is alert but disoriented SKIN: No obvious rash, lesion, or ulcer.  LABORATORY PANEL:  Male CBC Recent Labs  Lab 03/30/19 0448  WBC 6.9  HGB 15.8  HCT 47.5  PLT 198   ------------------------------------------------------------------------------------------------------------------ Chemistries  Recent Labs  Lab 03/30/19 0448  NA 134*  K 4.0  CL 102  CO2 24  GLUCOSE 311*  BUN 15   CREATININE 1.11  CALCIUM 8.4*  MG 2.1   RADIOLOGY:  No results found. ASSESSMENT AND PLAN:   58 year old male with history of intracranial hemorrhage x2, short-term memory loss and erythrocytosis who presents with generalized weakness, confusion and fever   *New right lower extremity weakness-with involuntary movements.  Concern for psychological weakness.  Improved. -Previous stroke work-up has been negative.  Last CT head was 3 weeks ago.  *Acute Sepsis - on admission- due to proctitis and now resolved. [name] in the blood on 01/20/19 Completed course of antibiotics with p.o. Bactrim - currently afebrile, hemodynamically stable.   *Confusion, short-term memory loss ??Had rapidly progressive dementia - ? Underlying vascular dementia as well. 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 No acute findings or no features of meningitis seen. - mental status currently at baseline.   *depression - improved. Continue Remeron   *Portal vein thrombosis -Continue eliquis. No acute issues  *Generalized weakness PT recommending SNF/STR    *Hypertension, chronic Continue Norvasc  -Patient has a place that has accepted to take him in [place] under letter of guarantee.    To skilled nursing facility once bed available.  All the records are reviewed and case discussed with Care Management/Social Worker. Management plans discussed with the patient, family and they are in agreement.  CODE STATUS: Full Code  TOTAL TIME TAKING CARE OF THIS PATIENT: 34 minutes.   More than 50% of the time was spent in counseling/coordination of care: YES  POSSIBLE D/C IN 1-2 DAYS, DEPENDING ON CLINICAL CONDITION.   Maxwell Miller M.D on 03/30/2019 at 1:01 PM  Between 7am to 6pm - Pager - [phone]  After 6pm go to [email or web address] - Social research officer, governmentpassword EPAS [facility]  [facility] [place] [facility]  Office  [phone]  CC:  Primary care physician; Mable ParisWilliams, Katrina, PA-C  Note: This dictation was prepared with Dragon dictation along with smaller phrase technology. Any transcriptional errors that result from this process are unintentional.

## 2019-03-31 LAB — NOVEL CORONAVIRUS, NAA (HOSP ORDER, SEND-OUT TO REF LAB; TAT 18-24 HRS): SARS-CoV-2, NAA: NOT DETECTED

## 2019-03-31 NOTE — Progress Notes (Signed)
[employer] - [place] at [facility]   PATIENT NAME: Maxwell Miller    MR#:  [id number]  DATE OF BIRTH:  [birth year]  SUBJECTIVE:  CHIEF COMPLAINT:   Chief Complaint  Patient presents with  . Altered Mental Status   No new complaints morning.  No fevers.  REVIEW OF SYSTEMS:  ROS Unobtainable due to underlying dementia. DRUG ALLERGIES:  No Known Allergies VITALS:  Blood pressure (!) 137/93, pulse 70, temperature 98.3 F (36.8 C), temperature source Oral, resp. rate 18, height 5\' 11"  (1.803 m), weight 87.6 kg, SpO2 96 %. PHYSICAL EXAMINATION:  Physical Exam  GENERAL:  58 y.o.-year-old patient lying in the bed with no acute distress.  EYES: Pupils equal, round, reactive to light and accommodation. No scleral icterus. Extraocular muscles intact.  HEENT: Head atraumatic, normocephalic. Oropharynx and nasopharynx clear.  NECK:  Supple, no jugular venous distention. No thyroid enlargement, no tenderness.  LUNGS: Normal breath sounds bilaterally, no wheezing, rales,rhonchi or crepitation. No use of accessory muscles of respiration.  CARDIOVASCULAR: S1, S2 normal. No murmurs, rubs, or gallops.  ABDOMEN: Soft, nontender, nondistended. Bowel sounds present. No organomegaly or mass.  EXTREMITIES: No pedal edema, cyanosis, or clubbing.  NEUROLOGIC:  Muscle strength 5/5 in all extremities except right lower extremity, it is 1/5.  Has hyperreflexia in right lower extremity.  Has significant upper extremity tremors.  Sensation intact. Gait not checked.  PSYCHIATRIC: The patient is alert but disoriented SKIN: No obvious rash, lesion, or ulcer.  LABORATORY PANEL:  Male CBC Recent Labs  Lab 03/30/19 0448  WBC 6.9  HGB 15.8  HCT 47.5  PLT 198   ------------------------------------------------------------------------------------------------------------------ Chemistries  Recent Labs  Lab 03/30/19 0448  NA 134*  K 4.0  CL 102  CO2 24  GLUCOSE 311*  BUN 15   CREATININE 1.11  CALCIUM 8.4*  MG 2.1   RADIOLOGY:  No results found. ASSESSMENT AND PLAN:   58 year old male with history of intracranial hemorrhage x2, short-term memory loss and erythrocytosis who presents with generalized weakness, confusion and fever   1. New right lower extremity weakness-with involuntary movements.  Concern for psychological weakness.  Improved. -Previous stroke work-up has been negative.  Last CT head was 3 weeks ago.  2. Acute Sepsis - on admission- due to proctitis and now resolved. [name] in the blood on 01/20/19 Completed course of antibiotics with p.o. Bactrim - currently afebrile, hemodynamically stable.   3. Confusion, short-term memory loss ??Had rapidly progressive dementia - ? Underlying vascular dementia as well. 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 No acute findings or no features of meningitis seen. - mental status currently at baseline.   4. depression - improved. Continue Remeron   5. Portal vein thrombosis -Continue eliquis. No acute issues  6. Generalized weakness PT recommending SNF/STR    7. Hypertension, chronic Continue Norvasc  -Patient has a place that has accepted to take him in [place] under letter of guarantee.    To skilled nursing facility once bed available.  All the records are reviewed and case discussed with Care Management/Social Worker. Management plans discussed with the patient, family and they are in agreement.  CODE STATUS: Full Code  TOTAL TIME TAKING CARE OF THIS PATIENT: 28 minutes.   More than 50% of the time was spent in counseling/coordination of care: YES  POSSIBLE D/C IN 1-2 DAYS, DEPENDING ON CLINICAL CONDITION.   Delesia Martinek M.D on 03/31/2019 at 12:42 PM  Between  7am to 6pm - Pager - [phone]  After 6pm go to [email or web address] - Social research officer, governmentpassword EPAS [facility]  [facility] [place] [facility]  Office   [phone]  CC: Primary care physician; Mable ParisWilliams, Katrina, PA-C  Note: This dictation was prepared with Dragon dictation along with smaller phrase technology. Any transcriptional errors that result from this process are unintentional.

## 2019-03-31 NOTE — Progress Notes (Signed)
OT Cancellation Note  Patient Details Name: Maxwell Miller MRN: [id number] DOB: 10-07-1961   Cancelled Treatment:    Reason Eval/Treat Not Completed: Patient declined, no reason specified. Pt politely declining therapy, but requesting snacks. Therapist provided fresh ice for his drink and crackers, per pt request. Pt agreeable to OT re-attempting next date.   Richrd Prime, MPH, MS, OTR/L ascom [phone] 03/31/19, 2:06 PM

## 2019-04-01 ENCOUNTER — Inpatient Hospital Stay: Payer: Medicaid Other

## 2019-04-01 LAB — GLUCOSE, CAPILLARY: Glucose-Capillary: 293 mg/dL — ABNORMAL HIGH (ref 70–99)

## 2019-04-01 MED ORDER — MIRTAZAPINE 15 MG PO TBDP: 15.0000 mg | ORAL_TABLET | Freq: Every day | ORAL | 1 refills | Status: DC

## 2019-04-01 MED ORDER — METOPROLOL TARTRATE 25 MG PO TABS: 25.0000 mg | ORAL_TABLET | Freq: Two times a day (BID) | ORAL | 2 refills | Status: AC

## 2019-04-01 NOTE — TOC Transition Note (Signed)
Transition of Care [facility]) - CM/SW Discharge Note   Patient Details  Name: Maxwell Miller MRN: [id number] Date of Birth: [birth year]  Transition of Care [facility]) CM/SW Contact:  Ruthe Mannan, LCSWA Phone Number: 04/01/2019, 1:32 PM   Clinical Narrative:   Patient is medically ready for discharge today. Patient will discharge to [facility] today. Patient's wife has been notified as well as DSS. Patient will be transported by EMS. RN to call report and call for transport.     Final next level of care: Skilled Nursing Facility Barriers to Discharge: No Barriers Identified   Patient Goals and CMS Choice   CMS Medicare.gov Compare Post Acute Care list provided to:: Patient Represenative (must comment) Choice offered to / list presented to : Spouse  Discharge Placement              Patient chooses bed at: [facility] ) Patient to be transferred to facility by: EMS Name of family member notified: Wife  Patient and family notified of of transfer: 04/01/19  Discharge Plan and Services   Discharge Planning Services: CM Consult Post Acute Care Choice: Home Health, Durable Medical Equipment                    HH Arranged: RN, PT, Nurse's Aide HH Agency: [facility]        Social Determinants of Health (SDOH) Interventions     Readmission Risk Interventions No flowsheet data found.

## 2019-04-01 NOTE — Progress Notes (Signed)
Nutrition Follow-up  RD working remotely.  DOCUMENTATION CODES:   Non-severe (moderate) malnutrition in context of social or environmental circumstances  INTERVENTION:  Continue Ensure Enlive po BID, each supplement provides 350 kcal and 20 grams of protein.  NUTRITION DIAGNOSIS:   Moderate Malnutrition related to social / environmental circumstances as evidenced by mild fat depletion, moderate fat depletion, mild muscle depletion, moderate muscle depletion.  Ongoing - addressing with intervention.  GOAL:   Patient will meet greater than or equal to 90% of their needs  Progressing.  MONITOR:   PO intake, Supplement acceptance, Weight trends, I & O's  REASON FOR ASSESSMENT:   LOS    ASSESSMENT:   58 year old male who presented to the ED on 3/10 with AMS. PMH of stroke, HTN, memory loss. Pt admitted with sepsis.  Patient continues to have good appetite. He is eating 100% of meals and drinking Ensure Enlive BID. Also noted in chart he has been requesting snacks. Patient has been approved for 30 day stay at [facility] in [place] and can go once bed available.  Medications reviewed and include: Eliquis, folic acid 1 mg daily, Remeron QHS, MVI daily, Miralax, senna-docusate, Flomax, thiamine 100 mg daily.  Labs reviewed.  Diet Order:   Diet Order            Diet - low sodium heart healthy        Diet regular Room service appropriate? Yes; Fluid consistency: Thin  Diet effective now        Diet - low sodium heart healthy             EDUCATION NEEDS:   Education needs have been addressed  Skin:  Skin Assessment: Reviewed RN Assessment  Last BM:  03/31/2019 - large type 5  Height:   Ht Readings from Last 1 Encounters:  01/20/19 5\' 11"  (1.803 m)   Weight:   Wt Readings from Last 1 Encounters:  02/12/19 87.6 kg   Ideal Body Weight:  78.2 kg  BMI:  Body mass index is 26.93 kg/m.  Estimated Nutritional Needs:   Kcal:  2150-2350  Protein:   110-125 grams  Fluid:  >/= 2.2 L  Helane Rima, MS, RD, LDN Office: [phone] Pager: [phone] After Hours/Weekend Pager: [phone]

## 2019-04-01 NOTE — Discharge Summary (Signed)
[facility] [place] at [facility]   PATIENT NAME: Maxwell GurneyHansy Miller    MR#:  [id number]  DATE OF BIRTH:  Apr 21, 1961  DATE OF ADMISSION:  01/20/2019   ADMITTING PHYSICIAN: Adrian SaranSital Mody, MD  DATE OF DISCHARGE:  04/01/19  PRIMARY CARE PHYSICIAN: Mable ParisWilliams, Katrina, PA-C   ADMISSION DIAGNOSIS:   Sepsis without acute organ dysfunction, due to unspecified organism (HCC) [A41.9]  DISCHARGE DIAGNOSIS:   Active Problems:   Sepsis (HCC)   Malnutrition of moderate degree   SECONDARY DIAGNOSIS:   Past Medical History:  Diagnosis Date  . Hypertension   . Stroke [facility](HCC)     HOSPITAL COURSE:   58 year old male with history of intracranial hemorrhage x2, short-term memory loss and erythrocytosis who presents with generalized weakness, confusion and fever   1.  right lower extremity weakness-with involuntary movements. Concern for psychological weakness.   . -Previous complete stroke work-up this admission has been negative. Last CT head was 3 weeks ago. -repeat CT head prior to discharge today  2. Acute Sepsis - on admission- due to proctitis and now resolved. [name] in the blood on 01/20/19 Completed course of antibiotics with p.o. Bactrim - currently afebrile, hemodynamically stable.   3. Confusion, short-term memory loss ??Had rapidly progressive dementia - ? Underlying vascular dementia as well. 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 No acute findings or no features of meningitis seen. - mental status currently at baseline. does not interact much- at times, he just sleeps and sometimes he interacts  4. depression - improved. Continue Remeron, effexor   5. Portal vein thrombosis -Continue eliquis. No acute issues  6. Generalized weakness PT recommending SNF/STR   7. Hypertension, chronic Continue Norvasc, metoprolol  -Patient has a place that [place] accepted [place] letter of guarantee.    [facility] [place].  DISCHARGE CONDITIONS:   Guarded  CONSULTS OBTAINED:   Treatment Team:  Auburn BilberryPatel, Shreyang, MD Pauletta BrownsZeylikman, Yuriy, MD  DRUG ALLERGIES:   No Known Allergies DISCHARGE MEDICATIONS:   Allergies as of 04/01/2019   No Known Allergies     Medication List    STOP taking these medications   lisinopril 40 MG tablet Commonly known as:  ZESTRIL   QUEtiapine 50 MG tablet Commonly known as:  SEROQUEL     TAKE these medications   amLODipine 10 MG tablet Commonly known as:  NORVASC Take 10 mg by mouth daily.   apixaban 5 MG Tabs tablet Commonly known as:  ELIQUIS Take 5 mg by mouth 2 (two) times daily.   atorvastatin 20 MG tablet Commonly known as:  LIPITOR Take 20 mg by mouth daily.   clonazePAM 1 MG tablet Commonly known as:  KLONOPIN Take 1 mg by mouth 2 (two) times daily.   metoprolol tartrate 25 MG tablet Commonly known as:  LOPRESSOR Take 1 tablet (25 mg total) by mouth 2 (two) times daily.   mirtazapine 15 MG disintegrating tablet Commonly known as:  REMERON SOL-TAB Take 1 tablet (15 mg total) by mouth at bedtime.   tamsulosin 0.4 MG Caps capsule Commonly known as:  FLOMAX Take 1 capsule (0.4 mg total) by mouth daily.   venlafaxine XR 150 MG 24 hr capsule Commonly known as:  EFFEXOR-XR Take 150 mg by mouth daily.            Durable Medical Equipment  (From admission, onward)  Start     Ordered   01/24/19 0749  For home use only DME Walker tall  [facility])  Once    Question:  Patient needs a walker to treat with the following condition  Answer:  Generalized weakness   01/24/19 0749           DISCHARGE INSTRUCTIONS:   1. PCP f/u in 1-2 weeks  DIET:   Low sodium diet  ACTIVITY:   Activity as tolerated  OXYGEN:   Home Oxygen: No.  Oxygen Delivery: room air  DISCHARGE LOCATION:   nursing home   If you experience  worsening of your admission symptoms, develop shortness of breath, life threatening emergency, suicidal or homicidal thoughts you must seek medical attention immediately by calling 911 or calling your MD immediately  if symptoms less severe.  You Must read complete instructions/literature along with all the possible adverse reactions/side effects for all the Medicines you take and that have been prescribed to you. Take any new Medicines after you have completely understood and accpet all the possible adverse reactions/side effects.   Please note  You were cared for by a hospitalist during your hospital stay. If you have any questions about your discharge medications or the care you received while you were in the hospital after you are discharged, you can call the unit and asked to speak with the hospitalist on call if the hospitalist that took care of you is not available. Once you are discharged, your primary care physician will handle any further medical issues. Please note that NO REFILLS for any discharge medications will be authorized once you are discharged, as it is imperative that you return to your primary care physician (or establish a relationship with a primary care physician if you do not have one) for your aftercare needs so that they can reassess your need for medications and monitor your lab values.    On the day of Discharge:  VITAL SIGNS:   Blood pressure 140/85, pulse 81, temperature 98.3 F (36.8 C), resp. rate 17, height 5\' 11"  (1.803 m), weight 87.6 kg, SpO2 96 %.  PHYSICAL EXAMINATION:   GENERAL:  58 y.o.-year-old patient lying in the bed with no acute distress.  EYES: Pupils equal, round, reactive to light and accommodation. No scleral icterus. Extraocular muscles intact.  HEENT: Head atraumatic, normocephalic. Oropharynx and nasopharynx clear.  NECK:  Supple, no jugular venous distention. No thyroid enlargement, no tenderness.  LUNGS: Normal breath sounds bilaterally,  no wheezing, rales,rhonchi or crepitation. No use of accessory muscles of respiration.  CARDIOVASCULAR: S1, S2 normal. No murmurs, rubs, or gallops.  ABDOMEN: Soft, nontender, nondistended. Bowel sounds present. No organomegaly or mass.  EXTREMITIES: No pedal edema, cyanosis, or clubbing.  NEUROLOGIC: Cranial nerves II through XII are intact. Muscle strength 5/5 in all extremities except right lower extremity, it is 1/5.  hyporelfexia in right lower extremity today.  Has significant upper extremity tremors.  Sensation intact. Gait not checked.  PSYCHIATRIC: The patient is alert but disoriented SKIN: No obvious rash, lesion, or ulcer.   DATA REVIEW:   CBC Recent Labs  Lab 03/30/19 0448  WBC 6.9  HGB 15.8  HCT 47.5  PLT 198    Chemistries  Recent Labs  Lab 03/30/19 0448  NA 134*  K 4.0  CL 102  CO2 24  GLUCOSE 311*  BUN 15  CREATININE 1.11  CALCIUM 8.4*  MG 2.1     Microbiology Results  Results for orders placed or performed during the  hospital encounter of 01/20/19  Blood Culture (routine x 2)     Status: None   Collection Time: 01/20/19  8:43 AM  Result Value Ref Range Status   Specimen Description BLOOD RIGHT [facility]  Final   Special Requests   Final    BOTTLES DRAWN AEROBIC AND ANAEROBIC Blood Culture adequate volume   Culture   Final    NO GROWTH 5 DAYS Performed at [facility], [address], [place], [place] [address]    Report Status 01/25/2019 FINAL  Final  Blood Culture (routine x 2)     Status: Abnormal   Collection Time: 01/20/19  9:14 AM  Result Value Ref Range Status   Specimen Description   Final    BLOOD BLOOD LEFT FOREARM Performed at [facility], [address], [place], [place] [address]    Special Requests   Final    BOTTLES DRAWN AEROBIC AND ANAEROBIC Blood Culture results may not be optimal due to an excessive volume of blood received in culture bottles Performed at [facility], [address],  [place], [place] [address]    Culture  Setup Time   Final    ANAEROBIC BOTTLE ONLY GRAM NEGATIVE RODS CRITICAL RESULT CALLED TO, READ BACK BY AND VERIFIED WITH: CHRISTIAN MURRELL AT 0753 ON 01/21/2019 JJB Performed at [facility], [address], [place], [place] [address]    Culture ESCHERICHIA COLI (A)  Final   Report Status 01/23/2019 FINAL  Final   Organism ID, Bacteria ESCHERICHIA COLI  Final      Susceptibility   Escherichia coli - MIC*    AMPICILLIN <=2 SENSITIVE Sensitive     CEFAZOLIN <=4 SENSITIVE Sensitive     CEFEPIME <=1 SENSITIVE Sensitive     CEFTAZIDIME <=1 SENSITIVE Sensitive     CEFTRIAXONE <=1 SENSITIVE Sensitive     CIPROFLOXACIN >=4 RESISTANT Resistant     GENTAMICIN <=1 SENSITIVE Sensitive     IMIPENEM <=0.25 SENSITIVE Sensitive     TRIMETH/SULFA <=20 SENSITIVE Sensitive     AMPICILLIN/SULBACTAM <=2 SENSITIVE Sensitive     PIP/TAZO <=4 SENSITIVE Sensitive     Extended ESBL NEGATIVE Sensitive     * ESCHERICHIA COLI  Blood Culture ID Panel (Reflexed)     Status: Abnormal   Collection Time: 01/20/19  9:14 AM  Result Value Ref Range Status   Enterococcus species NOT DETECTED NOT DETECTED Final   Listeria monocytogenes NOT DETECTED NOT DETECTED Final   Staphylococcus species NOT DETECTED NOT DETECTED Final   Staphylococcus aureus (BCID) NOT DETECTED NOT DETECTED Final   Streptococcus species NOT DETECTED NOT DETECTED Final   Streptococcus agalactiae NOT DETECTED NOT DETECTED Final   Streptococcus pneumoniae NOT DETECTED NOT DETECTED Final   Streptococcus pyogenes NOT DETECTED NOT DETECTED Final   Acinetobacter baumannii NOT DETECTED NOT DETECTED Final   Enterobacteriaceae species DETECTED (A) NOT DETECTED Final    Comment: Enterobacteriaceae represent a large family of gram-negative bacteria, not a single organism. CRITICAL RESULT CALLED TO, READ BACK BY AND VERIFIED WITH: CHRISTIAN MURRELL AT 0753 ON 01/21/2019 JJB    Enterobacter cloacae complex NOT  DETECTED NOT DETECTED Final   Escherichia coli DETECTED (A) NOT DETECTED Final    Comment: CRITICAL RESULT CALLED TO, READ BACK BY AND VERIFIED WITH: CHRISTIAN MURRELL AT 0753 ON 01/21/2019 JJB    Klebsiella oxytoca NOT DETECTED NOT DETECTED Final   Klebsiella pneumoniae NOT DETECTED NOT DETECTED Final   Proteus species NOT DETECTED NOT DETECTED Final  Serratia marcescens NOT DETECTED NOT DETECTED Final   Carbapenem resistance NOT DETECTED NOT DETECTED Final   Haemophilus influenzae NOT DETECTED NOT DETECTED Final   Neisseria meningitidis NOT DETECTED NOT DETECTED Final   Pseudomonas aeruginosa NOT DETECTED NOT DETECTED Final   Candida albicans NOT DETECTED NOT DETECTED Final   Candida glabrata NOT DETECTED NOT DETECTED Final   Candida krusei NOT DETECTED NOT DETECTED Final   Candida parapsilosis NOT DETECTED NOT DETECTED Final   Candida tropicalis NOT DETECTED NOT DETECTED Final    Comment: Performed at [facility] [address], [place], [place] [address]  Urine culture     Status: None   Collection Time: 01/20/19  9:58 AM  Result Value Ref Range Status   Specimen Description   Final    URINE, RANDOM Performed at [facility], [address] [place], [place] [address]    Special Requests   Final    NONE Performed at [facility], [address], [place], [place] [address]    Culture   Final    NO GROWTH Performed at [facility], [address], [place], [place] [address]    Report Status 01/21/2019 FINAL  Final  MRSA PCR Screening     Status: None   Collection Time: 01/20/19 12:45 PM  Result Value Ref Range Status   MRSA by PCR NEGATIVE NEGATIVE Final    Comment:        The GeneXpert MRSA Assay (FDA approved for NASAL specimens only), is one component of a comprehensive MRSA colonization surveillance program. It is not intended to diagnose MRSA infection nor to guide or monitor treatment for MRSA infections. Performed at  [facility], [address], [place], [place] [address]   CSF culture     Status: None   Collection Time: 01/20/19  1:32 PM  Result Value Ref Range Status   Specimen Description   Final    CSF Performed at [facility], [address] [place], [place] [address]    Special Requests   Final    NONE Performed at [facility], [address] [place], [place] [address]    Gram Stain   Final    NO ORGANISMS SEEN WBC SEEN RED BLOOD CELLS Performed at [facility], [address], [place], [place] [address]    Culture   Final    NO GROWTH 3 DAYS Performed at [facility], [address] [place], [place] [address]    Report Status 01/24/2019 FINAL  Final  Novel Coronavirus, NAA (hospital order; send-out to ref lab)     Status: None   Collection Time: 03/06/19 11:07 AM  Result Value Ref Range Status   SARS-CoV-2, NAA NOT DETECTED NOT DETECTED Final    Comment: (NOTE) This test was developed and its performance characteristics determined by [employer]. This test has not been FDA cleared or approved. This test has been authorized by FDA under an Emergency Use Authorization (EUA). This test has been validated in accordance with the FDA's Guidance Document (Policy for Diagnostics Testing in Laboratories Certified to Perform High Complexity Testing under CLIA prior to Emergency Use Authorization for Coronavirus Disease-2019 during the [facility] Emergency) issued on February 29th, 2020. FDA independent review of this validation is pending. This test is only authorized for the duration of time the declaration that circumstances exist justifying the authorization of the emergency use of in vitro diagnostic tests for detection of SARS-CoV- 2  virus and/or diagnosis of COVID-19 infection under section 564(b)(1) of the Act, 21 U.S.C. 161WRU-0(A)(5), unless the authorization is terminated or revoked sooner. Performed At: [facility] [address] [place], [place] [id number] Jolene Schimke MD NW:[id number]    Coronavirus Source NASOPHARYNGEAL  Final    Comment: Performed at [facility], [address] [place], [place] [address]  Novel Coronavirus, NAA (hospital order; send-out to ref lab)     Status: None   Collection Time: 03/30/19  4:24 PM  Result Value Ref Range Status   SARS-CoV-2, NAA NOT DETECTED NOT DETECTED Final    Comment: (NOTE) This test was developed and its performance characteristics determined by [employer]. This test has not been FDA cleared or approved. This test has been authorized by FDA under an Emergency Use Authorization (EUA). This test is only authorized for the duration of time the declaration that circumstances exist justifying the authorization of the emergency use of in vitro diagnostic tests for detection of SARS-CoV-2 virus and/or diagnosis of COVID-19 infection under section 564(b)(1) of the Act, 21 U.S.C. 962XBM-8(U)(1), unless the authorization is terminated or revoked sooner. When diagnostic testing is negative, the possibility of a false negative result should be considered in the context of a patient's recent exposures and the presence of clinical signs and symptoms consistent with COVID-19. An individual without symptoms of COVID-19 and who is not shedding SARS-CoV-2 virus would expect to have a negative (not detected) result in this assay. Performed  At: [facility] [address] [place], [place] [id number] Jolene Schimke MD OZ:[id number]    Coronavirus Source NASOPHARYNGEAL  Final    Comment: Performed at [facility], [address] [place], [place] [address]    RADIOLOGY:  No results found.   Management plans discussed with the patient, family and they are in agreement.  CODE STATUS:     Code Status Orders  (From admission, onward)         Start     Ordered   01/20/19 1235  Full code   Continuous     01/20/19 1235        Code Status History    This patient has a current code status but no historical code status.    Advance Directive Documentation     Most Recent Value  Type of Advance Directive  Healthcare Power of Attorney  Pre-existing out of facility DNR order (yellow form or pink MOST form)  -  "MOST" Form in Place?  -      TOTAL TIME TAKING CARE OF THIS PATIENT: 38 minutes.    Enid Baas M.D on 04/01/2019 at 12:44 PM  Between 7am to 6pm - Pager - [phone]  After 6pm go to [email or web address] - Social research officer, government  [facility] [place] [facility]  Office  [phone]  CC: Primary care physician; Mable Paris, PA-C   Note: This dictation was prepared with Dragon dictation along with smaller phrase technology. Any transcriptional errors that result from this process are unintentional.

## 2019-04-08 ENCOUNTER — Telehealth: Payer: Self-pay | Admitting: Physician Assistant

## 2019-04-08 NOTE — Telephone Encounter (Signed)
APS worker contacted CSW to find out where patient discharged to.CSW updated her that patient has gone to [facility].    By Darleene Cleaver, LCSW

## 2019-09-17 ENCOUNTER — Inpatient Hospital Stay (HOSPITAL_COMMUNITY)
Admission: EM | Admit: 2019-09-17 | Discharge: 2019-10-18 | DRG: 871 | Disposition: A | Discharge status: Skilled Nursing Facility | Payer: 59 | Source: Skilled Nursing Facility | Attending: Family Medicine | Admitting: Family Medicine

## 2019-09-17 ENCOUNTER — Emergency Department (HOSPITAL_COMMUNITY): Payer: 59

## 2019-09-17 ENCOUNTER — Inpatient Hospital Stay (HOSPITAL_COMMUNITY): Payer: 59

## 2019-09-17 ENCOUNTER — Encounter (HOSPITAL_COMMUNITY): Payer: Self-pay | Admitting: Family Medicine

## 2019-09-17 ENCOUNTER — Other Ambulatory Visit: Payer: Self-pay

## 2019-09-17 DIAGNOSIS — E7220 Disorder of urea cycle metabolism, unspecified: Secondary | ICD-10-CM | POA: Diagnosis not present

## 2019-09-17 DIAGNOSIS — J9691 Respiratory failure, unspecified with hypoxia: Secondary | ICD-10-CM

## 2019-09-17 DIAGNOSIS — R739 Hyperglycemia, unspecified: Secondary | ICD-10-CM | POA: Diagnosis not present

## 2019-09-17 DIAGNOSIS — Z66 Do not resuscitate: Secondary | ICD-10-CM | POA: Diagnosis not present

## 2019-09-17 DIAGNOSIS — A419 Sepsis, unspecified organism: Secondary | ICD-10-CM | POA: Diagnosis present

## 2019-09-17 DIAGNOSIS — F0150 Vascular dementia without behavioral disturbance: Secondary | ICD-10-CM | POA: Diagnosis present

## 2019-09-17 DIAGNOSIS — I10 Essential (primary) hypertension: Secondary | ICD-10-CM

## 2019-09-17 DIAGNOSIS — E861 Hypovolemia: Secondary | ICD-10-CM | POA: Diagnosis present

## 2019-09-17 DIAGNOSIS — J1289 Other viral pneumonia: Secondary | ICD-10-CM | POA: Diagnosis present

## 2019-09-17 DIAGNOSIS — R197 Diarrhea, unspecified: Secondary | ICD-10-CM | POA: Diagnosis present

## 2019-09-17 DIAGNOSIS — G9341 Metabolic encephalopathy: Secondary | ICD-10-CM | POA: Diagnosis present

## 2019-09-17 DIAGNOSIS — E1165 Type 2 diabetes mellitus with hyperglycemia: Secondary | ICD-10-CM | POA: Diagnosis present

## 2019-09-17 DIAGNOSIS — I81 Portal vein thrombosis: Secondary | ICD-10-CM

## 2019-09-17 DIAGNOSIS — J1282 Pneumonia due to coronavirus disease 2019: Secondary | ICD-10-CM

## 2019-09-17 DIAGNOSIS — E872 Acidosis: Secondary | ICD-10-CM | POA: Diagnosis present

## 2019-09-17 DIAGNOSIS — E860 Dehydration: Secondary | ICD-10-CM | POA: Diagnosis present

## 2019-09-17 DIAGNOSIS — A4189 Other specified sepsis: Secondary | ICD-10-CM | POA: Diagnosis present

## 2019-09-17 DIAGNOSIS — R1310 Dysphagia, unspecified: Secondary | ICD-10-CM | POA: Diagnosis present

## 2019-09-17 DIAGNOSIS — E870 Hyperosmolality and hypernatremia: Secondary | ICD-10-CM | POA: Diagnosis present

## 2019-09-17 DIAGNOSIS — E669 Obesity, unspecified: Secondary | ICD-10-CM | POA: Diagnosis present

## 2019-09-17 DIAGNOSIS — D751 Secondary polycythemia: Secondary | ICD-10-CM | POA: Diagnosis present

## 2019-09-17 DIAGNOSIS — E876 Hypokalemia: Secondary | ICD-10-CM | POA: Diagnosis present

## 2019-09-17 DIAGNOSIS — U071 COVID-19: Secondary | ICD-10-CM | POA: Diagnosis present

## 2019-09-17 DIAGNOSIS — F331 Major depressive disorder, recurrent, moderate: Secondary | ICD-10-CM | POA: Diagnosis present

## 2019-09-17 DIAGNOSIS — Z7901 Long term (current) use of anticoagulants: Secondary | ICD-10-CM

## 2019-09-17 DIAGNOSIS — R6520 Severe sepsis without septic shock: Secondary | ICD-10-CM | POA: Diagnosis present

## 2019-09-17 DIAGNOSIS — D689 Coagulation defect, unspecified: Secondary | ICD-10-CM | POA: Diagnosis present

## 2019-09-17 DIAGNOSIS — J9601 Acute respiratory failure with hypoxia: Secondary | ICD-10-CM | POA: Diagnosis present

## 2019-09-17 DIAGNOSIS — F419 Anxiety disorder, unspecified: Secondary | ICD-10-CM | POA: Diagnosis present

## 2019-09-17 DIAGNOSIS — Z86718 Personal history of other venous thrombosis and embolism: Secondary | ICD-10-CM | POA: Diagnosis not present

## 2019-09-17 DIAGNOSIS — R8271 Bacteriuria: Secondary | ICD-10-CM

## 2019-09-17 DIAGNOSIS — N179 Acute kidney failure, unspecified: Secondary | ICD-10-CM | POA: Diagnosis present

## 2019-09-17 DIAGNOSIS — Z8673 Personal history of transient ischemic attack (TIA), and cerebral infarction without residual deficits: Secondary | ICD-10-CM

## 2019-09-17 DIAGNOSIS — N400 Enlarged prostate without lower urinary tract symptoms: Secondary | ICD-10-CM | POA: Diagnosis present

## 2019-09-17 DIAGNOSIS — Z6825 Body mass index (BMI) 25.0-25.9, adult: Secondary | ICD-10-CM

## 2019-09-17 DIAGNOSIS — E782 Mixed hyperlipidemia: Secondary | ICD-10-CM | POA: Diagnosis present

## 2019-09-17 DIAGNOSIS — R000 Tachycardia, unspecified: Secondary | ICD-10-CM | POA: Diagnosis present

## 2019-09-17 DIAGNOSIS — Z79899 Other long term (current) drug therapy: Secondary | ICD-10-CM

## 2019-09-17 DIAGNOSIS — Z6830 Body mass index (BMI) 30.0-30.9, adult: Secondary | ICD-10-CM

## 2019-09-17 DIAGNOSIS — E440 Moderate protein-calorie malnutrition: Secondary | ICD-10-CM | POA: Diagnosis not present

## 2019-09-17 DIAGNOSIS — D696 Thrombocytopenia, unspecified: Secondary | ICD-10-CM | POA: Diagnosis not present

## 2019-09-17 HISTORY — DX: Other specified diseases of anus and rectum: K62.89

## 2019-09-17 HISTORY — DX: Mixed hyperlipidemia: E78.2

## 2019-09-17 HISTORY — DX: Major depressive disorder, recurrent, moderate: F33.1

## 2019-09-17 HISTORY — DX: Vascular dementia, unspecified severity, without behavioral disturbance, psychotic disturbance, mood disturbance, and anxiety: F01.50

## 2019-09-17 HISTORY — DX: Nontraumatic intracerebral hemorrhage, unspecified: I61.9

## 2019-09-17 HISTORY — DX: Anxiety disorder, unspecified: F41.9

## 2019-09-17 HISTORY — DX: Benign prostatic hyperplasia without lower urinary tract symptoms: N40.0

## 2019-09-17 HISTORY — DX: Other malaise: R53.81

## 2019-09-17 HISTORY — DX: Portal vein thrombosis: I81

## 2019-09-17 HISTORY — DX: COVID-19: U07.1

## 2019-09-17 LAB — BLOOD GAS, VENOUS
Acid-base deficit: 0.5 mmol/L (ref 0.0–2.0)
Bicarbonate: 24.5 mmol/L (ref 20.0–28.0)
O2 Saturation: 82.4 %
Patient temperature: 98.6
pCO2, Ven: 42.8 mmHg — ABNORMAL LOW (ref 44.0–60.0)
pH, Ven: 7.377 (ref 7.250–7.430)
pO2, Ven: 47.4 mmHg — ABNORMAL HIGH (ref 32.0–45.0)

## 2019-09-17 LAB — CBC WITH DIFFERENTIAL/PLATELET
Abs Immature Granulocytes: 0.1 10*3/uL — ABNORMAL HIGH (ref 0.00–0.07)
Band Neutrophils: 2 %
Basophils Absolute: 0 10*3/uL (ref 0.0–0.1)
Basophils Relative: 0 %
Eosinophils Absolute: 0 10*3/uL (ref 0.0–0.5)
Eosinophils Relative: 0 %
HCT: 69.4 % — ABNORMAL HIGH (ref 39.0–52.0)
Hemoglobin: 21.5 g/dL (ref 13.0–17.0)
Lymphocytes Relative: 16 %
Lymphs Abs: 1 10*3/uL (ref 0.7–4.0)
MCH: 28.3 pg (ref 26.0–34.0)
MCHC: 31 g/dL (ref 30.0–36.0)
MCV: 91.3 fL (ref 80.0–100.0)
Monocytes Absolute: 0.2 10*3/uL (ref 0.1–1.0)
Monocytes Relative: 3 %
Myelocytes: 1 %
Neutro Abs: 5 10*3/uL (ref 1.7–7.7)
Neutrophils Relative %: 78 %
Platelets: 115 10*3/uL — ABNORMAL LOW (ref 150–400)
RBC: 7.6 MIL/uL — ABNORMAL HIGH (ref 4.22–5.81)
RDW: 18.5 % — ABNORMAL HIGH (ref 11.5–15.5)
WBC: 6.3 10*3/uL (ref 4.0–10.5)
nRBC: 0 % (ref 0.0–0.2)

## 2019-09-17 LAB — BASIC METABOLIC PANEL
Anion gap: 15 (ref 5–15)
Anion gap: 11 (ref 5–15)
Anion gap: 7 (ref 5–15)
BUN: 34 mg/dL — ABNORMAL HIGH (ref 6–20)
BUN: 30 mg/dL — ABNORMAL HIGH (ref 6–20)
BUN: 29 mg/dL — ABNORMAL HIGH (ref 6–20)
CO2: 21 mmol/L — ABNORMAL LOW (ref 22–32)
CO2: 22 mmol/L (ref 22–32)
CO2: 25 mmol/L (ref 22–32)
Calcium: 8.2 mg/dL — ABNORMAL LOW (ref 8.9–10.3)
Calcium: 8 mg/dL — ABNORMAL LOW (ref 8.9–10.3)
Calcium: 8.5 mg/dL — ABNORMAL LOW (ref 8.9–10.3)
Chloride: 117 mmol/L — ABNORMAL HIGH (ref 98–111)
Chloride: 120 mmol/L — ABNORMAL HIGH (ref 98–111)
Chloride: 122 mmol/L — ABNORMAL HIGH (ref 98–111)
Creatinine, Ser: 1.67 mg/dL — ABNORMAL HIGH (ref 0.61–1.24)
Creatinine, Ser: 1.55 mg/dL — ABNORMAL HIGH (ref 0.61–1.24)
Creatinine, Ser: 1.57 mg/dL — ABNORMAL HIGH (ref 0.61–1.24)
GFR calc Af Amer: 52 mL/min — ABNORMAL LOW (ref >60)
GFR calc Af Amer: 57 mL/min — ABNORMAL LOW (ref >60)
GFR calc Af Amer: 56 mL/min — ABNORMAL LOW (ref >60)
GFR calc non Af Amer: 45 mL/min — ABNORMAL LOW (ref >60)
GFR calc non Af Amer: 49 mL/min — ABNORMAL LOW (ref >60)
GFR calc non Af Amer: 48 mL/min — ABNORMAL LOW (ref >60)
Glucose, Bld: 461 mg/dL — ABNORMAL HIGH (ref 70–99)
Glucose, Bld: 402 mg/dL — ABNORMAL HIGH (ref 70–99)
Glucose, Bld: 252 mg/dL — ABNORMAL HIGH (ref 70–99)
Potassium: 4.4 mmol/L (ref 3.5–5.1)
Potassium: 4.4 mmol/L (ref 3.5–5.1)
Potassium: 4.4 mmol/L (ref 3.5–5.1)
Sodium: 153 mmol/L — ABNORMAL HIGH (ref 135–145)
Sodium: 153 mmol/L — ABNORMAL HIGH (ref 135–145)
Sodium: 154 mmol/L — ABNORMAL HIGH (ref 135–145)

## 2019-09-17 LAB — COMPREHENSIVE METABOLIC PANEL
ALT: 27 U/L (ref 0–44)
AST: 41 U/L (ref 15–41)
Albumin: 3.5 g/dL (ref 3.5–5.0)
Alkaline Phosphatase: 67 U/L (ref 38–126)
Anion gap: 18 — ABNORMAL HIGH (ref 5–15)
BUN: 36 mg/dL — ABNORMAL HIGH (ref 6–20)
CO2: 20 mmol/L — ABNORMAL LOW (ref 22–32)
Calcium: 8.8 mg/dL — ABNORMAL LOW (ref 8.9–10.3)
Chloride: 111 mmol/L (ref 98–111)
Creatinine, Ser: 1.97 mg/dL — ABNORMAL HIGH (ref 0.61–1.24)
GFR calc Af Amer: 42 mL/min — ABNORMAL LOW (ref >60)
GFR calc non Af Amer: 37 mL/min — ABNORMAL LOW (ref >60)
Glucose, Bld: 525 mg/dL (ref 70–99)
Potassium: 4.7 mmol/L (ref 3.5–5.1)
Sodium: 149 mmol/L — ABNORMAL HIGH (ref 135–145)
Total Bilirubin: 1.7 mg/dL — ABNORMAL HIGH (ref 0.3–1.2)
Total Protein: 8.8 g/dL — ABNORMAL HIGH (ref 6.5–8.1)

## 2019-09-17 LAB — CBG MONITORING, ED
Glucose-Capillary: 489 mg/dL — ABNORMAL HIGH (ref 70–99)
Glucose-Capillary: 427 mg/dL — ABNORMAL HIGH (ref 70–99)
Glucose-Capillary: 464 mg/dL — ABNORMAL HIGH (ref 70–99)
Glucose-Capillary: 440 mg/dL — ABNORMAL HIGH (ref 70–99)
Glucose-Capillary: 368 mg/dL — ABNORMAL HIGH (ref 70–99)
Glucose-Capillary: 369 mg/dL — ABNORMAL HIGH (ref 70–99)

## 2019-09-17 LAB — URINALYSIS, ROUTINE W REFLEX MICROSCOPIC
Bilirubin Urine: NEGATIVE
Glucose, UA: >=500 mg/dL — AB
Ketones, ur: 20 mg/dL — AB
Leukocytes,Ua: NEGATIVE
Nitrite: NEGATIVE
Protein, ur: >=300 mg/dL — AB
Specific Gravity, Urine: 1.037 — ABNORMAL HIGH (ref 1.005–1.030)
pH: 5 (ref 5.0–8.0)

## 2019-09-17 LAB — GLUCOSE, CAPILLARY
Glucose-Capillary: 331 mg/dL — ABNORMAL HIGH (ref 70–99)
Glucose-Capillary: 281 mg/dL — ABNORMAL HIGH (ref 70–99)
Glucose-Capillary: 225 mg/dL — ABNORMAL HIGH (ref 70–99)
Glucose-Capillary: 234 mg/dL — ABNORMAL HIGH (ref 70–99)
Glucose-Capillary: 191 mg/dL — ABNORMAL HIGH (ref 70–99)

## 2019-09-17 LAB — PROCALCITONIN: Procalcitonin: 0.11 ng/mL

## 2019-09-17 LAB — SARS CORONAVIRUS 2 (TAT 6-24 HRS): SARS Coronavirus 2: POSITIVE — AB

## 2019-09-17 LAB — LACTIC ACID, PLASMA
Lactic Acid, Venous: 2.5 mmol/L (ref 0.5–1.9)
Lactic Acid, Venous: 2.4 mmol/L (ref 0.5–1.9)

## 2019-09-17 LAB — PROTIME-INR
INR: 1.5 — ABNORMAL HIGH (ref 0.8–1.2)
Prothrombin Time: 18.2 seconds — ABNORMAL HIGH (ref 11.4–15.2)

## 2019-09-17 LAB — FIBRINOGEN: Fibrinogen: 723 mg/dL — ABNORMAL HIGH (ref 210–475)

## 2019-09-17 LAB — D-DIMER, QUANTITATIVE: D-Dimer, Quant: 2.01 ug/mL-FEU — ABNORMAL HIGH (ref 0.00–0.50)

## 2019-09-17 LAB — FERRITIN: Ferritin: 2461 ng/mL — ABNORMAL HIGH (ref 24–336)

## 2019-09-17 LAB — LACTATE DEHYDROGENASE: LDH: 576 U/L — ABNORMAL HIGH (ref 98–192)

## 2019-09-17 LAB — C-REACTIVE PROTEIN: CRP: 14.1 mg/dL — ABNORMAL HIGH (ref <1.0)

## 2019-09-17 LAB — MRSA PCR SCREENING: MRSA by PCR: NEGATIVE

## 2019-09-17 LAB — ABO/RH: ABO/RH(D): O NEG

## 2019-09-17 LAB — APTT: aPTT: 38 seconds — ABNORMAL HIGH (ref 24–36)

## 2019-09-17 LAB — HEMOGLOBIN A1C
Hgb A1c MFr Bld: 12.8 % — ABNORMAL HIGH (ref 4.8–5.6)
Mean Plasma Glucose: 320.66 mg/dL

## 2019-09-17 LAB — TRIGLYCERIDES: Triglycerides: 278 mg/dL — ABNORMAL HIGH (ref <150)

## 2019-09-17 MED ORDER — POTASSIUM CHLORIDE 10 MEQ/100ML IV SOLN
10.0000 meq | INTRAVENOUS | Status: AC
  Administered 2019-09-17 (×2): 10 meq via INTRAVENOUS
  Filled 2019-09-17 (×2): qty 100

## 2019-09-17 MED ORDER — ONDANSETRON HCL 4 MG/2ML IJ SOLN: 4.0000 mg | Freq: Four times a day (QID) | INTRAMUSCULAR | Status: DC | PRN

## 2019-09-17 MED ORDER — ACETAMINOPHEN 650 MG RE SUPP
650.0000 mg | Freq: Once | RECTAL | Status: AC
  Administered 2019-09-17: 650 mg via RECTAL
  Filled 2019-09-17: qty 1

## 2019-09-17 MED ORDER — ORAL CARE MOUTH RINSE
15.0000 mL | Freq: Two times a day (BID) | OROMUCOSAL | Status: DC
  Administered 2019-09-17 – 2019-10-18 (×51): 15 mL via OROMUCOSAL

## 2019-09-17 MED ORDER — INSULIN ASPART 100 UNIT/ML ~~LOC~~ SOLN
0.0000 [IU] | SUBCUTANEOUS | Status: DC
  Filled 2019-09-17: qty 0.09

## 2019-09-17 MED ORDER — PROPOFOL 1000 MG/100ML IV EMUL
5.0000 ug/kg/min | INTRAVENOUS | Status: DC
  Filled 2019-09-17: qty 100

## 2019-09-17 MED ORDER — THIAMINE HCL 100 MG/ML IJ SOLN
100.0000 mg | Freq: Every day | INTRAMUSCULAR | Status: DC
  Administered 2019-09-17 – 2019-09-27 (×11): 100 mg via INTRAVENOUS
  Filled 2019-09-17 (×7): qty 1
  Filled 2019-09-17: qty 2
  Filled 2019-09-17: qty 1
  Filled 2019-09-17: qty 2
  Filled 2019-09-17: qty 1

## 2019-09-17 MED ORDER — SODIUM CHLORIDE 0.9 % IV SOLN
INTRAVENOUS | Status: DC | PRN
  Administered 2019-09-17: 500 mL via INTRAVENOUS
  Administered 2019-09-17: 250 mL via INTRAVENOUS

## 2019-09-17 MED ORDER — LABETALOL HCL 5 MG/ML IV SOLN
5.0000 mg | Freq: Four times a day (QID) | INTRAVENOUS | Status: DC | PRN
  Administered 2019-09-17: 5 mg via INTRAVENOUS
  Filled 2019-09-17 (×2): qty 4

## 2019-09-17 MED ORDER — SODIUM CHLORIDE 0.9 % IV SOLN
INTRAVENOUS | Status: DC
  Administered 2019-09-17: 13:00:00 via INTRAVENOUS

## 2019-09-17 MED ORDER — INSULIN DETEMIR 100 UNIT/ML ~~LOC~~ SOLN
0.0750 [IU]/kg | Freq: Two times a day (BID) | SUBCUTANEOUS | Status: DC
  Filled 2019-09-17 (×2): qty 0.06

## 2019-09-17 MED ORDER — LACTATED RINGERS IV SOLN
INTRAVENOUS | Status: DC
  Administered 2019-09-18 – 2019-09-19 (×2): via INTRAVENOUS

## 2019-09-17 MED ORDER — DEXTROSE-NACL 5-0.45 % IV SOLN
INTRAVENOUS | Status: AC
  Administered 2019-09-17: 21:00:00 via INTRAVENOUS

## 2019-09-17 MED ORDER — CHLORHEXIDINE GLUCONATE CLOTH 2 % EX PADS
6.0000 | MEDICATED_PAD | Freq: Every day | CUTANEOUS | Status: DC
  Administered 2019-09-17 – 2019-10-05 (×12): 6 via TOPICAL

## 2019-09-17 MED ORDER — SODIUM CHLORIDE 0.9 % IV SOLN
200.0000 mg | Freq: Once | INTRAVENOUS | Status: AC
  Administered 2019-09-17: 200 mg via INTRAVENOUS
  Filled 2019-09-17: qty 40

## 2019-09-17 MED ORDER — LACTATED RINGERS IV BOLUS
1000.0000 mL | Freq: Once | INTRAVENOUS | Status: AC
  Administered 2019-09-17: 1000 mL via INTRAVENOUS

## 2019-09-17 MED ORDER — INSULIN REGULAR(HUMAN) IN NACL 100-0.9 UT/100ML-% IV SOLN
INTRAVENOUS | Status: AC
  Administered 2019-09-17: 13.9 [IU]/h via INTRAVENOUS
  Administered 2019-09-17: 3.7 [IU]/h via INTRAVENOUS
  Filled 2019-09-17 (×2): qty 100

## 2019-09-17 MED ORDER — LACTATED RINGERS IV BOLUS
1000.0000 mL | Freq: Once | INTRAVENOUS | Status: AC
  Administered 2019-09-17: 09:00:00 1000 mL via INTRAVENOUS

## 2019-09-17 MED ORDER — ACETAMINOPHEN 325 MG PO TABS: 650.0000 mg | ORAL_TABLET | Freq: Four times a day (QID) | ORAL | Status: DC | PRN

## 2019-09-17 MED ORDER — VANCOMYCIN HCL 10 G IV SOLR
1750.0000 mg | Freq: Once | INTRAVENOUS | Status: AC
  Administered 2019-09-17: 1750 mg via INTRAVENOUS
  Filled 2019-09-17: qty 1750

## 2019-09-17 MED ORDER — ROCURONIUM BROMIDE 50 MG/5ML IV SOLN
80.0000 mg | Freq: Once | INTRAVENOUS | Status: DC
  Filled 2019-09-17: qty 8

## 2019-09-17 MED ORDER — SODIUM CHLORIDE 0.45 % IV SOLN
INTRAVENOUS | Status: DC
  Administered 2019-09-17: 14:00:00 via INTRAVENOUS

## 2019-09-17 MED ORDER — DEXAMETHASONE SODIUM PHOSPHATE 10 MG/ML IJ SOLN
6.0000 mg | INTRAMUSCULAR | Status: DC
  Administered 2019-09-17 – 2019-09-23 (×8): 6 mg via INTRAVENOUS
  Filled 2019-09-17 (×8): qty 1

## 2019-09-17 MED ORDER — SODIUM CHLORIDE 0.9 % IV SOLN
100.0000 mg | INTRAVENOUS | Status: AC
  Administered 2019-09-18 – 2019-09-21 (×4): 100 mg via INTRAVENOUS
  Filled 2019-09-17 (×4): qty 20

## 2019-09-17 MED ORDER — ETOMIDATE 2 MG/ML IV SOLN
20.0000 mg | Freq: Once | INTRAVENOUS | Status: DC
  Filled 2019-09-17: qty 10

## 2019-09-17 MED ORDER — ACETAMINOPHEN 10 MG/ML IV SOLN
1000.0000 mg | Freq: Once | INTRAVENOUS | Status: AC
  Administered 2019-09-17: 1000 mg via INTRAVENOUS
  Filled 2019-09-17: qty 100

## 2019-09-17 MED ORDER — SODIUM CHLORIDE 0.9 % IV SOLN
2.0000 g | Freq: Once | INTRAVENOUS | Status: AC
  Administered 2019-09-17: 2 g via INTRAVENOUS
  Filled 2019-09-17: qty 2

## 2019-09-17 MED ORDER — ONDANSETRON HCL 4 MG PO TABS: 4.0000 mg | ORAL_TABLET | Freq: Four times a day (QID) | ORAL | Status: DC | PRN

## 2019-09-17 MED ORDER — ACETAMINOPHEN 650 MG RE SUPP
650.0000 mg | Freq: Four times a day (QID) | RECTAL | Status: DC | PRN
  Administered 2019-09-18: 650 mg via RECTAL
  Filled 2019-09-17: qty 1

## 2019-09-17 MED ORDER — DEXAMETHASONE SODIUM PHOSPHATE 10 MG/ML IJ SOLN: 6.0000 mg | Freq: Every day | INTRAMUSCULAR | Status: DC

## 2019-09-17 MED ORDER — FAMOTIDINE IN NACL 20-0.9 MG/50ML-% IV SOLN
20.0000 mg | Freq: Two times a day (BID) | INTRAVENOUS | Status: DC
  Administered 2019-09-17 – 2019-09-27 (×20): 20 mg via INTRAVENOUS
  Filled 2019-09-17 (×20): qty 50

## 2019-09-17 MED ORDER — ENOXAPARIN SODIUM 80 MG/0.8ML ~~LOC~~ SOLN
1.0000 mg/kg | Freq: Two times a day (BID) | SUBCUTANEOUS | Status: DC
  Administered 2019-09-18: 80 mg via SUBCUTANEOUS
  Filled 2019-09-17 (×2): qty 0.8

## 2019-09-17 MED ORDER — VITAMIN C 500 MG PO TABS: 500.0000 mg | ORAL_TABLET | Freq: Every day | ORAL | Status: DC

## 2019-09-17 MED ORDER — ZINC SULFATE 220 (50 ZN) MG PO CAPS: 220.0000 mg | ORAL_CAPSULE | Freq: Every day | ORAL | Status: DC

## 2019-09-17 NOTE — Progress Notes (Signed)
  Afternoon rounds  Brief interval: I had the opportunity to speak with the patient's niece Robbi Garter and his sister Joh Rao both via phone.  I expressed to them both my concern about his progressive acute lung injury in the setting of Covid pneumonia and progressive hypoxic respiratory failure.  We discussed his multiple baseline medical comorbidities and poor prognosis associated with Covid pneumonia when progresses to the point when needs mechanical ventilation.  I have recommended DO NOT RESUSCITATE/DO NOT INTUBATE, the family wanted more time to discuss this, Ms. Randol Kern was planning on discussing CODE STATUS, and goals of care with her mother and other involved family members with plan to get back to the team.  As of time of dictation I have yet to hear back.  Hospital course/ER course thus far: Remains tachycardic, tachypneic, and febrile Mental status appears the same, responds to noxious stimulus such as pain Has been started on insulin infusion for hyperglycemia Has received Decadron, remdesivir, empiric vancomycin and cefepime, as well as IV hydration. Still exhibiting some accessory muscle use when agitated Remains on 4 L nasal cannula  Exam Blood Pressure (Abnormal) 173/100   Pulse (Abnormal) 134   Temperature (Abnormal) 102.3 F (39.1 C) (Rectal)   Respiration (Abnormal) 26   Height (Significant) 5\' 11"  (1.803 m)   Weight (Significant) 81.6 kg   Oxygen Saturation 98%   Body Mass Index 25.09 kg/m   General exam: 58 year old black male currently remains lethargic on nasal cannula support HEENT mucous membranes dry, neck veins flat, sclera nonicteric Pulmonary: Clear, diminished, accessory use with any exertion Cardiac: Tachycardic regular rate and rhythm Abdomen soft nontender Extremities warm and dry Neuro opens eyes to painful stimulus withdraws to pain  Laboratory review: Blood glucose remains above 400, has been initiated on insulin drip Sodium up to  153, chloride 117, serum creatinine improving from 1.97 down to 1.67.  Impression/plan Acute hypoxic respiratory failure COVID-19 positive test (U07.1, COVID-19) with Acute Pneumonia (J12.89, Other viral pneumonia) Possible HCAP Acute metabolic encephalopathy Fever Systemic inflammatory response syndrome Lactic acidosis AKI Hyperglycemia Volume depletion Diabetes Polycythemia  Discussion He looks about the same compared to when examined earlier today.  I continued to hope we can avoid intubation as I think he will do poorly should things progress towards this, see above as I have discussed this with his family at [place] as outlined Awaiting transfer to ICU at [place] [facility] Pager # [phone] OR # [phone] if no answer

## 2019-09-17 NOTE — ED Notes (Signed)
Patient transported to CT 

## 2019-09-17 NOTE — ED Notes (Signed)
[facility] at bedside 

## 2019-09-17 NOTE — Consult Note (Signed)
Maxwell Miller, MRN:  [id number], DOB:  October 10, 1961, LOS: 0 ADMISSION DATE:  09/17/2019, CONSULTATION DATE:  11/5 REFERRING MD:  [name], CHIEF COMPLAINT:  Acute respiratory failure and COVID PNA  Brief History   59 year old male patient with history of vascular dementia residing at skilled nursing facility diagnosed with COVID-19 infection on 10/28.  Admitted 11/5 with bibasilar pneumonia, progressive respiratory failure and worsening mental status  Temp 105.4 Initial lab eval: LDH 576 , Procalcitonin 0.11, INR 1.5, fibrinogen 723, D-dimer 2.01, CRP 14.1.  Ferritin 2461 History of present illness    This is a 58 year old black male who lives at a skilled nursing facility secondary to cognitive deficits and right lower extremity weakness following ICH and vascular dementia.  He was recently diagnosed with Covid-19 infection on 10/28 at the skilled nursing facility.  EMS was summoned to the skilled nursing facility on 11/5 When patient was found to be more lethargic, had elevated glucose, and significant fever over 105 Fahrenheit.  On arrival to the emergency room the patient was tachypneic with respiratory rate in the 30s, exhibited some paradoxical respiratory efforts, and portable chest x-ray which demonstrated left greater than right basilar airspace disease. He is placed on supplemental oxygen, cultures were obtained, empiric antibiotics for nosocomial infection started and pulmonary asked to evaluate. Past Medical History  Vascular dementia, he is a resident of the skilled nursing facility, nontraumatic ICH, portal vein thrombosis on Xarelto.  Diagnosed with COVID-19 on 10/28 -Functional status he is able to carry on a conversation at baseline  Significant Hospital Events   10/28 diagnosed with COVID-19 infection at skilled nursing facility 11/5: Admitted for respiratory failure  Consults:  Pulmonary consult 11/5  Procedures:    Significant Diagnostic Tests:    Micro  Data:    Antimicrobials:  Vancomycin 11/5:  Cefepime 11/5: Remdesivir 11/5  Interim history/subjective:  Responds to pain   Objective   Blood pressure (Abnormal) 155/98, pulse (Abnormal) 129, temperature (Abnormal) 102.3 F (39.1 C), temperature source Rectal, resp. rate (Abnormal) 32, height (Significant) 5\' 11"  (1.803 m), weight (Significant) 81.6 kg, SpO2 97 %.        Intake/Output Summary (Last 24 hours) at 09/17/2019 1029 Last data filed at 09/17/2019 1016 Gross per 24 hour  Intake 2100 ml  Output no documentation  Net 2100 ml   Filed Weights   09/17/19 0822  Weight: (Significant) 81.6 kg    Examination: General: 58 year old black male who is currently normally responsive lying in stretcher HENT: Mucous membranes dry sclera nonicteric no temporal wasting neck veins flat no nasal flare Lungs: Tachypneic crackles both bases paradoxical efforts  cardiovascular: Tachycardic regular rhythm Abdomen: Soft not tender Extremities: No edema dry skin pulses palpable Neuro: Difficult to arouse moves extremities GU: Due to void  Resolved Hospital Problem list     Assessment & Plan:  Acute hypoxic respiratory failure 2/2 COVID-19 PNA and what appears to be evolving ARDS -This portable chest x-ray was personally reviewed demonstrates bibasilar left greater than right airspace disease with what appears to be evolving acute lung injury Plan Continue supplemental oxygen Initiate IV Decadron and remdesivir Serial inflammatory markers daily Pulse oximetry Continue empiric coverage for HCAP day #1 vancomycin and cefepime I am reaching out to family, I think he is a poor candidate for intubation/ventilation  Acute metabolic encephalopathy 2/2 fever and sepsis from COVID-19 infection superimposed on underlying vascular dementia -baseline communicative. Has RLE weakness.  Plan Treat fever Supportive care N.p.o.  Fever/SIRS/sepsis: Primarily secondary  to Covid pneumonia, cannot  exclude secondary HCAP Plan IV hydration Antibiotics and antivirals as above Admit to [facility] ICU  Lactic acidosis: In the setting of sepsis as well as high fever and work of breathing Plan IV hydration Repeat lactic acid Supportive care  AKI 2/2 volume depletion  Plan Continue IV hydration Renal dose medications Strict intake and output A.m. chemistry  Fluid & electrolyte imbalance: hypernatremia  2/2 hypovolemia/volume depletion (suspect he is having significant insensible losses from fever) Plan IV hydration Strict intake output A.m. chemistry  DM w/ hyperglycemia Plan Check beta hydroxybutyric acid Will likely need insulin drip given hyperglycemia and need for IV steroids  Polycythemia. Suspect that this is more 2/2 volume depletion and reflects hemoconcentration Plan Repeat CBC following hydration  Mild Coagulopathy Plan Therapeutic low molecular weight heparin  Thrombocytopenia Plan Close observation of CBC  Best practice:  Diet: N.p.o. Pain/Anxiety/Delirium protocol (if indicated): Not applicable as of yet VAP protocol (if indicated): Not applicable as of yet DVT prophylaxis: Therapeutic low molecular weight heparin GI prophylaxis: PPI Glucose control: Sliding scale insulin will likely need infusion Mobility: Bedrest Code Status: Full code as of now but reaching out to family Family Communication: primary  Disposition:   Labs   CBC: Recent Labs  Lab 09/17/19 0754  WBC 6.3  NEUTROABS 5.0  HGB 21.5*  HCT 69.4*  MCV 91.3  PLT 115*    Basic Metabolic Panel: Recent Labs  Lab 09/17/19 0754  NA 149*  K 4.7  CL 111  CO2 20*  GLUCOSE 525*  BUN 36*  CREATININE 1.97*  CALCIUM 8.8*   GFR: Estimated Creatinine Clearance: 44.1 mL/min (A) (by C-G formula based on SCr of 1.97 mg/dL (H)). Recent Labs  Lab 09/17/19 0753 09/17/19 0754 09/17/19 0800  PROCALCITON  --   --  0.11  WBC  --  6.3  --   LATICACIDVEN 2.5*  --   --     Liver  Function Tests: Recent Labs  Lab 09/17/19 0754  AST 41  ALT 27  ALKPHOS 67  BILITOT 1.7*  PROT 8.8*  ALBUMIN 3.5   No results for input(s): LIPASE, AMYLASE in the last 168 hours. No results for input(s): AMMONIA in the last 168 hours.  ABG    Component Value Date/Time   HCO3 24.5 09/17/2019 0808   ACIDBASEDEF 0.5 09/17/2019 0808   O2SAT 82.4 09/17/2019 0808     Coagulation Profile: Recent Labs  Lab 09/17/19 0754  INR 1.5*    Cardiac Enzymes: No results for input(s): CKTOTAL, CKMB, CKMBINDEX, TROPONINI in the last 168 hours.  HbA1C: No results found for: HGBA1C  CBG: Recent Labs  Lab 09/17/19 0732  GLUCAP 489*    Review of Systems:   Not able   Past Medical History  He,  has a past medical history of Anal or rectal pain, Anxiety disorder, Clinical diagnosis of COVID-19, Hyperplasia of prostate without lower urinary tract symptoms (LUTS), Hypertension, Major depressive disorder, recurrent episode, moderate ([place]), Mixed hyperlipidemia, Nontraumatic intracerebral hemorrhage ([place]), Other malaise, Portal vein thrombosis, Stroke [facility]), and Vascular dementia without behavioral disturbance ([place]).   Surgical History   History reviewed. No pertinent surgical history.   Social History   reports that he has never smoked. He has never used smokeless tobacco. He reports that he does not drink alcohol or use drugs.   Family History   His family history is not on file.   Allergies No Known Allergies   Home Medications  Prior to  Admission medications   Medication Sig Start Date End Date Taking? Authorizing Provider  acetaminophen (TYLENOL) 325 MG tablet Take 650 mg by mouth every 6 (six) hours as needed for mild pain or fever.   Yes [provider]  amLODipine (NORVASC) 10 MG tablet Take 10 mg by mouth daily. 10/21/15  Yes [provider]  atorvastatin (LIPITOR) 20 MG tablet Take 20 mg by mouth at bedtime.  07/28/18  Yes [provider]   cholecalciferol (VITAMIN D3) 25 MCG (1000 UT) tablet Take 1,000 Units by mouth daily.   Yes [provider]  clonazePAM (KLONOPIN) 1 MG tablet Take 1 mg by mouth 2 (two) times daily. 03/07/16  Yes [provider]  cloNIDine (CATAPRES) 0.1 MG tablet Take 0.1 mg by mouth every 8 (eight) hours as needed. SBP above 160 or DBP above 100 08/31/19  Yes [provider]  docusate sodium (COLACE) 100 MG capsule Take 100 mg by mouth daily.   Yes [provider]  metoprolol tartrate (LOPRESSOR) 25 MG tablet Take 1 tablet (25 mg total) by mouth 2 (two) times daily. 04/01/19  Yes Enid BaasKalisetti, Radhika, MD  mirtazapine (REMERON SOL-TAB) 15 MG disintegrating tablet Take 1 tablet (15 mg total) by mouth at bedtime. 04/01/19  Yes Enid BaasKalisetti, Radhika, MD  OXYGEN Inhale 2-3 L into the lungs as needed (SOB related to Covid).   Yes [provider]  tamsulosin (FLOMAX) 0.4 MG CAPS capsule Take 1 capsule (0.4 mg total) by mouth daily. Patient taking differently: Take 0.4 mg by mouth at bedtime.  01/24/19  Yes Enedina FinnerPatel, Sona, MD  venlafaxine XR (EFFEXOR-XR) 150 MG 24 hr capsule Take 150 mg by mouth daily with breakfast.   Yes [provider]  XARELTO 20 MG TABS tablet Take 20 mg by mouth daily with supper.  09/10/19  Yes [provider]     Critical care time: 44 minutes.     Simonne MartinetPeter E Shirlee Whitmire ACNP-BC [facility] Pulmonary/Critical Care Pager # [phone] OR # [phone] if no answer

## 2019-09-17 NOTE — H&P (Addendum)
History and Physical  Maxwell Miller BBC:[id number] DOB: 1961-06-27 DOA: 09/17/2019  PCP: Ancil Boozer, PA-C Patient coming from: SNF  I have personally briefly reviewed patient's old medical records in [place]   Chief Complaint: Fever, Covid Positive  HPI: Maxwell Miller is a 58 y.o. male with past medical history significant for vascular dementia, nontraumatic intracerebral hemorrhage, history of portal vein thrombosis on Xarelto, recent diagnosis of COVID-19 on 10/28 who presents to the emergency department with elevated blood sugar, fever and altered mental status.  Per report patient at baseline is able to carry conversation.  Over the last 24 hours his symptoms has worsened, he is lethargic, febrile. Patient seen and examined.  Patient is lethargic, nonresponsive, move passively lower extremity.  Not following commands.  Unable to obtain history from patient.   Evaluation in the ED: Sodium 149, potassium 4.7, glucose 525, BUN 36, creatinine 1.9 anion gap 18, bilirubin 1.7, LDH 576, triglyceride 278, lactic acid 2.5, procalcitonin level 0.11, hemoglobin 20.1, white blood cell 6.3, platelets 115, fibrinogen 723, D-dimer 2.0, INR 1.5, glucose 525, UA white blood cell 11-20 chest x-ray:Lower lung volumes with indistinct basilar predominant bilateral pulmonary opacity greater on the left. Consider Acute Viral/atypical respiratory infection. Asymmetric pulmonary edema felt less likely. CCM has been consulted, they are recommending patient to be transferred to [facility], under triad care.  Review of Systems: All systems reviewed and apart from history of presenting illness, are negative.  Past Medical History:  Diagnosis Date  . Anal or rectal pain   . Anxiety disorder   . Clinical diagnosis of COVID-19   . Hyperplasia of prostate without lower urinary tract symptoms (LUTS)   . Hypertension   . Major depressive disorder, recurrent episode, moderate ([place])   .  Mixed hyperlipidemia   . Nontraumatic intracerebral hemorrhage ([place])   . Other malaise   . Portal vein thrombosis   . Stroke ([place])   . Vascular dementia without behavioral disturbance ([place])    History reviewed. No pertinent surgical history. Social History:  reports that he has never smoked. He has never used smokeless tobacco. He reports that he does not drink alcohol or use drugs.   No Known Allergies  Family history: Unable to obtain from patient.  Prior to Admission medications   Medication Sig Start Date End Date Taking? Authorizing Provider  acetaminophen (TYLENOL) 325 MG tablet Take 650 mg by mouth every 6 (six) hours as needed for mild pain or fever.   Yes [provider]  amLODipine (NORVASC) 10 MG tablet Take 10 mg by mouth daily. 10/21/15  Yes [provider]  atorvastatin (LIPITOR) 20 MG tablet Take 20 mg by mouth at bedtime.  07/28/18  Yes [provider]  cholecalciferol (VITAMIN D3) 25 MCG (1000 UT) tablet Take 1,000 Units by mouth daily.   Yes [provider]  clonazePAM (KLONOPIN) 1 MG tablet Take 1 mg by mouth 2 (two) times daily. 03/07/16  Yes [provider]  cloNIDine (CATAPRES) 0.1 MG tablet Take 0.1 mg by mouth every 8 (eight) hours as needed. SBP above 160 or DBP above 100 08/31/19  Yes [provider]  docusate sodium (COLACE) 100 MG capsule Take 100 mg by mouth daily.   Yes [provider]  metoprolol tartrate (LOPRESSOR) 25 MG tablet Take 1 tablet (25 mg total) by mouth 2 (two) times daily. 04/01/19  Yes Gladstone Lighter, MD  mirtazapine (REMERON SOL-TAB) 15 MG disintegrating tablet Take 1 tablet (15 mg  total) by mouth at bedtime. 04/01/19  Yes Gladstone Lighter, MD  OXYGEN Inhale 2-3 L into the lungs as needed (SOB related to Covid).   Yes [provider]  tamsulosin (FLOMAX) 0.4 MG CAPS capsule Take 1 capsule (0.4 mg total) by mouth daily. Patient taking differently: Take 0.4 mg by mouth at  bedtime.  01/24/19  Yes Fritzi Mandes, MD  venlafaxine XR (EFFEXOR-XR) 150 MG 24 hr capsule Take 150 mg by mouth daily with breakfast.   Yes [provider]  XARELTO 20 MG TABS tablet Take 20 mg by mouth daily with supper.  09/10/19  Yes [provider]   Physical Exam: Vitals:   09/17/19 0930 09/17/19 1000 09/17/19 1008 09/17/19 1030  BP: (!) 158/105 (!) 155/98  (!) 152/99  Pulse: (!) 130 (!) 129  (!) 131  Resp: (!) 29 (!) 32  (!) 32  Temp:   (!) 102.3 F (39.1 C)   TempSrc:   Rectal   SpO2: 97% 97%  98%  Weight:      Height:         General exam: Obese, lethargic, not following commands.  Head, eyes and ENT: Nontraumatic and normocephalic. Pupils equally reacting to light and accommodation. Oral mucosa dry.   Neck: Supple. No JVD, carotid bruit or thyromegaly.  Lymphatics: No lymphadenopathy.  Respiratory system: Tachypneic, decreased breath sounds bilaterally  Cardiovascular system: S1 and S2 heard, tachycardic no JVD, murmurs, gallops, clicks or pedal edema.  Gastrointestinal system: Abdomen is nondistended, soft and nontender. Normal bowel sounds heard. No organomegaly or masses appreciated.  Nontender  Central nervous system: Lethargic, not following commands  Extremities: No edema .peripheral pulses symmetrically felt.   Skin: No rashes or acute findings.  Psychiatry: Unable to obtain due to altered mental status   Labs on Admission:  Basic Metabolic Panel: Recent Labs  Lab 09/17/19 0754  NA 149*  K 4.7  CL 111  CO2 20*  GLUCOSE 525*  BUN 36*  CREATININE 1.97*  CALCIUM 8.8*   Liver Function Tests: Recent Labs  Lab 09/17/19 0754  AST 41  ALT 27  ALKPHOS 67  BILITOT 1.7*  PROT 8.8*  ALBUMIN 3.5   No results for input(s): LIPASE, AMYLASE in the last 168 hours. No results for input(s): AMMONIA in the last 168 hours. CBC: Recent Labs  Lab 09/17/19 0754  WBC 6.3  NEUTROABS 5.0  HGB 21.5*  HCT 69.4*  MCV 91.3  PLT 115*    Cardiac Enzymes: No results for input(s): CKTOTAL, CKMB, CKMBINDEX, TROPONINI in the last 168 hours.  BNP (last 3 results) No results for input(s): PROBNP in the last 8760 hours. CBG: Recent Labs  Lab 09/17/19 0732  GLUCAP 489*    Radiological Exams on Admission: Dg Chest Port 1 View  Result Date: 09/17/2019 CLINICAL DATA:  58 year old male found unresponsive. Fever and hyperglycemia. EXAM: PORTABLE CHEST 1 VIEW COMPARISON:  Portable chest 01/20/2019 and earlier. FINDINGS: Portable AP semi upright view at 0821 hours. Lower lung volumes. Patchy and indistinct basilar predominant pulmonary opacity greater on the left. Low lung volumes accentuate mediastinal contours which are probably stable. Visualized tracheal air column is within normal limits. No pneumothorax or pleural effusion. Paucity of bowel gas in the upper abdomen. No acute osseous abnormality identified. IMPRESSION: Lower lung volumes with indistinct basilar predominant bilateral pulmonary opacity greater on the left. Consider Acute Viral/atypical respiratory infection. Asymmetric pulmonary edema felt less likely. Electronically Signed   By: Genevie Ann M.D.   On:  09/17/2019 08:53    EKG: Independently reviewed.  Cardiac telemetry monitor.  Assessment/Plan Active Problems:   Sepsis ([place])   Malnutrition of moderate degree   Hypertension   Vascular dementia without behavioral disturbance (HCC)   Portal vein thrombosis   AKI (acute kidney injury) ([place])   Dehydration   Respiratory failure with hypoxia ([place])   Acute metabolic encephalopathy   1-Acute Hypoxic Respiratory failure secondary to Covid pneumonia: -Admit to ICU, [employer],  high risk for decompensation. -Start IV dexamethasone and Remdesivir.  -SARS coronavirus in-house repeated. -Patient was reported to be  Positive for COVID-19 at SNF.  -Test in house pending.   2-Acute metabolic encephalopathy: In the setting of viral illness, fever. -Monitor in the  ICU level. -Avoid sedatives.  -Support care.  -We will get CT head.Marland Kitchen  3-History of portal vein thrombosis: On Xarelto.  Plan to start Lovenox while n.p.o.   4-Sepsis; secondary to Covid 19.  IV fluids, antipyretics.   5-AKI; Likely pre renal, hypovolemia.  Received 2 L fluids.  Continue with LR at 75 cc.   6-Hypernatremia; On IV fluids.  Related to hypovolemia.   7-DM, Hyperglycemia; Early DKA; gap at 18, bicarb 20 ketone in urine.  Glucose 425, has not received dexamethasone. Gap was at 18, bicarb 20, ketone in urine. Will start insulin Gtt, check B-met, transition to long acting and SSI when CBG better controlled.   DVT Prophylaxis: Lovenox Code Status: Full code Family Communication: CCM will discussed care with family.  Disposition Plan: admit to ICU patient with Sepsis, respiratory failure, multiple organ failure requiring ICU level.    Time spent: 75 minutes.   Elmarie Shiley MD [facility]   09/17/2019, 10:38 AM

## 2019-09-17 NOTE — ED Provider Notes (Signed)
[facility] DEPT Provider Note   CSN: [id number] Arrival date & time: 09/17/19  2694     History   Chief Complaint Chief Complaint  Patient presents with   COVID Positive   Hyperglycemia   Fever    HPI Maxwell Miller is a 58 y.o. male.     Patient with history of vascular dementia currently residing at [facility] facility, history of nontraumatic intracerebral hemorrhage, history of portal vein thrombosis on Xarelto, recent diagnosis of COVID-19 --presents to the emergency department today with elevated blood sugars as well as fever.  No further information from EMS.  Level 5 caveat due to altered mental status.  Spoke with RN Elmyra Ricks who advises that patient is able to carry on conversations at baseline.  Since being diagnosed with coronavirus on 10/28 he has been more tired than normal.  She was not with patient last night but it appears that his symptoms have worsened in the past 24 hours.     Past Medical History:  Diagnosis Date   Anal or rectal pain    Anxiety disorder    Clinical diagnosis of COVID-19    Hyperplasia of prostate without lower urinary tract symptoms (LUTS)    Hypertension    Major depressive disorder, recurrent episode, moderate (HCC)    Mixed hyperlipidemia    Nontraumatic intracerebral hemorrhage (HCC)    Other malaise    Portal vein thrombosis    Stroke [facility])    Vascular dementia without behavioral disturbance [facility])     Patient Active Problem List   Diagnosis Date Noted   Malnutrition of moderate degree 01/30/2019   Sepsis ([place]) 01/20/2019    History reviewed. No pertinent surgical history.      Home Medications    Prior to Admission medications   Medication Sig Start Date End Date Taking? Authorizing Provider  amLODipine (NORVASC) 10 MG tablet Take 10 mg by mouth daily. 10/21/15   [provider]  apixaban (ELIQUIS) 5 MG TABS tablet Take 5 mg by mouth 2 (two) times  daily. 08/31/16   [provider]  atorvastatin (LIPITOR) 20 MG tablet Take 20 mg by mouth daily. 07/28/18   [provider]  clonazePAM (KLONOPIN) 1 MG tablet Take 1 mg by mouth 2 (two) times daily. 03/07/16   [provider]  metoprolol tartrate (LOPRESSOR) 25 MG tablet Take 1 tablet (25 mg total) by mouth 2 (two) times daily. 04/01/19   Gladstone Lighter, MD  mirtazapine (REMERON SOL-TAB) 15 MG disintegrating tablet Take 1 tablet (15 mg total) by mouth at bedtime. 04/01/19   Gladstone Lighter, MD  tamsulosin (FLOMAX) 0.4 MG CAPS capsule Take 1 capsule (0.4 mg total) by mouth daily. 01/24/19   Fritzi Mandes, MD  venlafaxine XR (EFFEXOR-XR) 150 MG 24 hr capsule Take 150 mg by mouth daily. 03/07/16   [provider]    Family History History reviewed. No pertinent family history.  Social History Social History   Tobacco Use   Smoking status: Never Smoker   Smokeless tobacco: Never Used  Substance Use Topics   Alcohol use: Never    Frequency: Never   Drug use: Never     Allergies   Patient has no known allergies.   Review of Systems Review of Systems  Unable to perform ROS: Dementia     Physical Exam Updated Vital Signs BP (!) 143/105 (BP Location: Right Arm)    Pulse (!) 134    Temp (S) (!) 105.4 F (40.8 C) (  Rectal)    Resp 13    SpO2 94%   Physical Exam Vitals signs and nursing note reviewed.  Constitutional:      Appearance: He is well-developed. He is ill-appearing.     Comments: Patient withdraws from painful stimulus.  Does not awaken to voice.  HENT:     Head: Normocephalic and atraumatic.  Eyes:     General:        Right eye: Discharge present.        Left eye: Discharge present.    Comments: Yellow-green crusting to bilateral eyes.  Neck:     Musculoskeletal: Normal range of motion and neck supple.  Cardiovascular:     Rate and Rhythm: Regular rhythm. Tachycardia present.     Heart sounds: Normal heart sounds.      Comments: Tachycardic. Pulmonary:     Effort: Pulmonary effort is normal.     Breath sounds: Normal breath sounds. No wheezing or rales.     Comments: Mild tachypnea. Abdominal:     Palpations: Abdomen is soft.     Tenderness: There is no abdominal tenderness.     Comments: No apparent tenderness, patient does not respond with palpation of the abdomen.  Musculoskeletal:        General: No swelling.     Comments: No obvious lower extremity swelling or signs of infection.  Skin:    General: Skin is warm.     Comments: Patient is diaphoretic on exam.  Neurological:     Mental Status: He is unresponsive.     Comments: Patient seems obtunded, responsive to painful stimulus only.  Unable to complete full neuro exam due to patient's mental status.  Extremities with good tone.      ED Treatments / Results  Labs (all labs ordered are listed, but only abnormal results are displayed) Labs Reviewed  LACTIC ACID, PLASMA - Abnormal; Notable for the following components:      Result Value   Lactic Acid, Venous 2.5 (*)    All other components within normal limits  COMPREHENSIVE METABOLIC PANEL - Abnormal; Notable for the following components:   Sodium 149 (*)    CO2 20 (*)    Glucose, Bld 525 (*)    BUN 36 (*)    Creatinine, Ser 1.97 (*)    Calcium 8.8 (*)    Total Protein 8.8 (*)    Total Bilirubin 1.7 (*)    GFR calc non Af Amer 37 (*)    GFR calc Af Amer 42 (*)    Anion gap 18 (*)    All other components within normal limits  CBC WITH DIFFERENTIAL/PLATELET - Abnormal; Notable for the following components:   RBC 7.60 (*)    Hemoglobin 21.5 (*)    HCT 69.4 (*)    RDW 18.5 (*)    Platelets 115 (*)    Abs Immature Granulocytes 0.10 (*)    All other components within normal limits  URINALYSIS, ROUTINE W REFLEX MICROSCOPIC - Abnormal; Notable for the following components:   Color, Urine AMBER (*)    APPearance CLOUDY (*)    Specific Gravity, Urine 1.037 (*)    Glucose, UA >=500  (*)    Hgb urine dipstick LARGE (*)    Ketones, ur 20 (*)    Protein, ur >=300 (*)    Bacteria, UA MANY (*)    All other components within normal limits  BLOOD GAS, VENOUS - Abnormal; Notable for the following components:   pCO2, Peter MiniumVen  42.8 (*)    pO2, Ven 47.4 (*)    All other components within normal limits  LACTATE DEHYDROGENASE - Abnormal; Notable for the following components:   LDH 576 (*)    All other components within normal limits  TRIGLYCERIDES - Abnormal; Notable for the following components:   Triglycerides 278 (*)    All other components within normal limits  CBG MONITORING, ED - Abnormal; Notable for the following components:   Glucose-Capillary 489 (*)    All other components within normal limits  CULTURE, BLOOD (ROUTINE X 2)  CULTURE, BLOOD (ROUTINE X 2)  URINE CULTURE  LACTIC ACID, PLASMA  PROCALCITONIN  FERRITIN  C-REACTIVE PROTEIN  D-DIMER, QUANTITATIVE (NOT AT [facility])  FIBRINOGEN  PROTIME-INR  APTT  I-STAT VENOUS BLOOD GAS, ED    ED ECG REPORT   Date: 09/17/2019  Rate: 143  Rhythm: sinus tachycardia  QRS Axis: normal  Intervals: normal  ST/T Wave abnormalities: nonspecific T wave changes  Conduction Disutrbances:none  Narrative Interpretation:   Old EKG Reviewed: appears similar  I have personally reviewed the EKG tracing and agree with the computerized printout as noted.  Radiology Dg Chest Port 1 View  Result Date: 09/17/2019 CLINICAL DATA:  58 year old male found unresponsive. Fever and hyperglycemia. EXAM: PORTABLE CHEST 1 VIEW COMPARISON:  Portable chest 01/20/2019 and earlier. FINDINGS: Portable AP semi upright view at 0821 hours. Lower lung volumes. Patchy and indistinct basilar predominant pulmonary opacity greater on the left. Low lung volumes accentuate mediastinal contours which are probably stable. Visualized tracheal air column is within normal limits. No pneumothorax or pleural effusion. Paucity of bowel gas in the upper abdomen. No acute  osseous abnormality identified. IMPRESSION: Lower lung volumes with indistinct basilar predominant bilateral pulmonary opacity greater on the left. Consider Acute Viral/atypical respiratory infection. Asymmetric pulmonary edema felt less likely. Electronically Signed   By: Odessa Fleming M.D.   On: 09/17/2019 08:53    Procedures Procedures (including critical care time)  Medications Ordered in ED Medications  vancomycin (VANCOCIN) 1,750 mg in sodium chloride 0.9 % 500 mL IVPB (1,750 mg Intravenous New Bag/Given 09/17/19 0904)  0.9 %  sodium chloride infusion (500 mLs Intravenous New Bag/Given 09/17/19 0805)  etomidate (AMIDATE) injection 20 mg (has no administration in time range)  rocuronium (ZEMURON) injection 80 mg (has no administration in time range)  propofol (DIPRIVAN) 1000 MG/100ML infusion (has no administration in time range)  acetaminophen (TYLENOL) suppository 650 mg (650 mg Rectal Given 09/17/19 0738)  ceFEPIme (MAXIPIME) 2 g in sodium chloride 0.9 % 100 mL IVPB (0 g Intravenous Stopped 09/17/19 0837)  lactated ringers bolus 1,000 mL (0 mLs Intravenous Stopped 09/17/19 0902)  lactated ringers bolus 1,000 mL (1,000 mLs Intravenous New Bag/Given 09/17/19 0904)     Initial Impression / Assessment and Plan / ED Course  I have reviewed the triage vital signs and the nursing notes.  Pertinent labs & imaging results that were available during my care of the patient were reviewed by me and considered in my medical decision making (see chart for details).        Patient seen and examined.  Delay in obtaining vital signs and temperature.  Patient appears septic on exam.  Reported blood sugar 340 by EMS in route.  Rectal temperature here is 105.4 F.  Patient discussed with and seen by Dr. Juleen China.  Sepsis lab work and empiric antibiotics ordered as well as fluids.  Vital signs reviewed and are as follows: BP (!) 143/105 (BP Location: Right  Arm)    Pulse (!) 134    Temp (S) (!) 105.4 F (40.8 C)  (Rectal)    Resp 13    SpO2 94%   9:09 AM Labs noted. Continuing resuscitation with IV fluids.  Patient remains tachycardic.  Chest x-ray reviewed.  Will admit to medicine.  9:37 AM Spoke with hospitalist who requested that we discuss case with PCCM. I spoke with PCCM, they will see, but given current status, patient should likely be intubated given obtundation. D/w Dr. Juleen China. Plan to intubate.   I attempted to contact family, no answer.   I attempted again, still no answer.   CRITICAL CARE Performed by: Renne Crigler PA-C Total critical care time: 50 minutes Critical care time was exclusive of separately billable procedures and treating other patients. Critical care was necessary to treat or prevent imminent or life-threatening deterioration. Critical care was time spent personally by me on the following activities: development of treatment plan with patient and/or surrogate as well as nursing, discussions with consultants, evaluation of patient's response to treatment, examination of patient, obtaining history from patient or surrogate, ordering and performing treatments and interventions, ordering and review of laboratory studies, ordering and review of radiographic studies, pulse oximetry and re-evaluation of patient's condition.   PCCM at bedside, now asking to not intubate until goals of care discussed with family -- patient has a  poor prognosis.    Final Clinical Impressions(s) / ED Diagnoses   Final diagnoses:  Sepsis with acute renal failure without septic shock, due to unspecified organism, unspecified acute renal failure type (HCC)  Pneumonia due to COVID-19 virus  Hyperglycemia without ketosis  Bacteria in urine  Polycythemia   Admit.   ED Discharge Orders    None       Renne Crigler, Cordelia Poche 09/17/19 1001    Raeford Razor, MD 09/17/19 1044

## 2019-09-17 NOTE — ED Notes (Signed)
Attempted report x1, [facility] RN unable to take report at this time d/t "2 transfers going out."  Will continue to monitor.

## 2019-09-17 NOTE — Progress Notes (Signed)
Pt arrived to [facility] via [facility], admitted into 204-3. Insulin and half-normal saline infusing. ST 120s, 97% on 4L [place]. BP 140s/90s. Febrile 101.5 axillary.   Admitting MD paged to notify of arrival.

## 2019-09-17 NOTE — ED Triage Notes (Signed)
Patient is from [place] and transported via [facility]. EMS was called out for fever and hyperglycemia.

## 2019-09-17 NOTE — ED Notes (Signed)
Report given to [facility]. 

## 2019-09-17 NOTE — Progress Notes (Addendum)
Pharmacy: Remdesivir   Patient is a 58 y.o. male with COVID.  Pharmacy has been consulted for remdesivir dosing.   - ALT: 27 - CXR shows: requiring supplemental oxygen, evidence of lower respiratory infection on chest imaging    A/P:  - Patient meets criteria for remdesivir. Will initiate remdesivir 200 mg once followed by 100 mg daily x 4 days.  - Daily CMET while on remdesivir - Will f/u pt's ALT and clinical condition  Dolly Rias RPh 09/17/2019, 11:23 AM   Pharmacy also consulted for therapeutic enoxaparin.  Pt takes xarelto for history of portal vein thrombosis.  Last dose 11/4 @ 1700  Scr 1.97, CrCl ~ 44.19mls/min Hgb 21.5, plts 115  Plan: At 1800 start enoxaparin 1mg /kg (80mg ) SQ q12h Follow renal function, signs and symptoms of bleeding CBC q72h  Dolly Rias RPh 09/17/2019, 1:11 PM

## 2019-09-18 ENCOUNTER — Other Ambulatory Visit: Payer: Self-pay

## 2019-09-18 DIAGNOSIS — G9341 Metabolic encephalopathy: Secondary | ICD-10-CM | POA: Diagnosis not present

## 2019-09-18 DIAGNOSIS — U071 COVID-19: Secondary | ICD-10-CM | POA: Diagnosis not present

## 2019-09-18 DIAGNOSIS — D751 Secondary polycythemia: Secondary | ICD-10-CM | POA: Diagnosis not present

## 2019-09-18 DIAGNOSIS — R739 Hyperglycemia, unspecified: Secondary | ICD-10-CM | POA: Diagnosis not present

## 2019-09-18 DIAGNOSIS — J1289 Other viral pneumonia: Secondary | ICD-10-CM

## 2019-09-18 LAB — BASIC METABOLIC PANEL
Anion gap: 10 (ref 5–15)
Anion gap: 8 (ref 5–15)
Anion gap: 6 (ref 5–15)
BUN: 30 mg/dL — ABNORMAL HIGH (ref 6–20)
BUN: 31 mg/dL — ABNORMAL HIGH (ref 6–20)
BUN: 32 mg/dL — ABNORMAL HIGH (ref 6–20)
CO2: 25 mmol/L (ref 22–32)
CO2: 24 mmol/L (ref 22–32)
CO2: 25 mmol/L (ref 22–32)
Calcium: 8.5 mg/dL — ABNORMAL LOW (ref 8.9–10.3)
Calcium: 8.6 mg/dL — ABNORMAL LOW (ref 8.9–10.3)
Calcium: 8.3 mg/dL — ABNORMAL LOW (ref 8.9–10.3)
Chloride: 123 mmol/L — ABNORMAL HIGH (ref 98–111)
Chloride: 124 mmol/L — ABNORMAL HIGH (ref 98–111)
Chloride: 125 mmol/L — ABNORMAL HIGH (ref 98–111)
Creatinine, Ser: 1.5 mg/dL — ABNORMAL HIGH (ref 0.61–1.24)
Creatinine, Ser: 1.59 mg/dL — ABNORMAL HIGH (ref 0.61–1.24)
Creatinine, Ser: 1.68 mg/dL — ABNORMAL HIGH (ref 0.61–1.24)
GFR calc Af Amer: 59 mL/min — ABNORMAL LOW (ref >60)
GFR calc Af Amer: 55 mL/min — ABNORMAL LOW (ref >60)
GFR calc Af Amer: 51 mL/min — ABNORMAL LOW (ref >60)
GFR calc non Af Amer: 51 mL/min — ABNORMAL LOW (ref >60)
GFR calc non Af Amer: 47 mL/min — ABNORMAL LOW (ref >60)
GFR calc non Af Amer: 44 mL/min — ABNORMAL LOW (ref >60)
Glucose, Bld: 110 mg/dL — ABNORMAL HIGH (ref 70–99)
Glucose, Bld: 138 mg/dL — ABNORMAL HIGH (ref 70–99)
Glucose, Bld: 151 mg/dL — ABNORMAL HIGH (ref 70–99)
Potassium: 4.8 mmol/L (ref 3.5–5.1)
Potassium: 4.4 mmol/L (ref 3.5–5.1)
Potassium: 4.6 mmol/L (ref 3.5–5.1)
Sodium: 158 mmol/L — ABNORMAL HIGH (ref 135–145)
Sodium: 156 mmol/L — ABNORMAL HIGH (ref 135–145)
Sodium: 156 mmol/L — ABNORMAL HIGH (ref 135–145)

## 2019-09-18 LAB — RESPIRATORY PANEL BY PCR

## 2019-09-18 LAB — CBC WITH DIFFERENTIAL/PLATELET
Abs Immature Granulocytes: 0.04 10*3/uL (ref 0.00–0.07)
Basophils Absolute: 0.1 10*3/uL (ref 0.0–0.1)
Basophils Relative: 1 %
Eosinophils Absolute: 0 10*3/uL (ref 0.0–0.5)
Eosinophils Relative: 0 %
HCT: 65.4 % — ABNORMAL HIGH (ref 39.0–52.0)
Hemoglobin: 19.8 g/dL — ABNORMAL HIGH (ref 13.0–17.0)
Immature Granulocytes: 1 %
Lymphocytes Relative: 25 %
Lymphs Abs: 1.6 10*3/uL (ref 0.7–4.0)
MCH: 27.8 pg (ref 26.0–34.0)
MCHC: 30.3 g/dL (ref 30.0–36.0)
MCV: 91.7 fL (ref 80.0–100.0)
Monocytes Absolute: 1.1 10*3/uL — ABNORMAL HIGH (ref 0.1–1.0)
Monocytes Relative: 16 %
Neutro Abs: 3.8 10*3/uL (ref 1.7–7.7)
Neutrophils Relative %: 57 %
Platelets: 115 10*3/uL — ABNORMAL LOW (ref 150–400)
RBC: 7.13 MIL/uL — ABNORMAL HIGH (ref 4.22–5.81)
RDW: 18.5 % — ABNORMAL HIGH (ref 11.5–15.5)
WBC: 6.6 10*3/uL (ref 4.0–10.5)
nRBC: 0 % (ref 0.0–0.2)

## 2019-09-18 LAB — GLUCOSE, CAPILLARY
Glucose-Capillary: 112 mg/dL — ABNORMAL HIGH (ref 70–99)
Glucose-Capillary: 113 mg/dL — ABNORMAL HIGH (ref 70–99)
Glucose-Capillary: 133 mg/dL — ABNORMAL HIGH (ref 70–99)
Glucose-Capillary: 137 mg/dL — ABNORMAL HIGH (ref 70–99)
Glucose-Capillary: 143 mg/dL — ABNORMAL HIGH (ref 70–99)
Glucose-Capillary: 145 mg/dL — ABNORMAL HIGH (ref 70–99)
Glucose-Capillary: 148 mg/dL — ABNORMAL HIGH (ref 70–99)
Glucose-Capillary: 149 mg/dL — ABNORMAL HIGH (ref 70–99)
Glucose-Capillary: 149 mg/dL — ABNORMAL HIGH (ref 70–99)
Glucose-Capillary: 155 mg/dL — ABNORMAL HIGH (ref 70–99)
Glucose-Capillary: 159 mg/dL — ABNORMAL HIGH (ref 70–99)
Glucose-Capillary: 162 mg/dL — ABNORMAL HIGH (ref 70–99)
Glucose-Capillary: 166 mg/dL — ABNORMAL HIGH (ref 70–99)
Glucose-Capillary: 235 mg/dL — ABNORMAL HIGH (ref 70–99)
Glucose-Capillary: 242 mg/dL — ABNORMAL HIGH (ref 70–99)
Glucose-Capillary: 276 mg/dL — ABNORMAL HIGH (ref 70–99)

## 2019-09-18 LAB — URINE CULTURE: Culture: NO GROWTH

## 2019-09-18 LAB — COMPREHENSIVE METABOLIC PANEL
ALT: 21 U/L (ref 0–44)
AST: 37 U/L (ref 15–41)
Albumin: 3 g/dL — ABNORMAL LOW (ref 3.5–5.0)
Alkaline Phosphatase: 52 U/L (ref 38–126)
Anion gap: 9 (ref 5–15)
BUN: 29 mg/dL — ABNORMAL HIGH (ref 6–20)
CO2: 25 mmol/L (ref 22–32)
Calcium: 8.5 mg/dL — ABNORMAL LOW (ref 8.9–10.3)
Chloride: 124 mmol/L — ABNORMAL HIGH (ref 98–111)
Creatinine, Ser: 1.49 mg/dL — ABNORMAL HIGH (ref 0.61–1.24)
GFR calc Af Amer: 60 mL/min — ABNORMAL LOW (ref >60)
GFR calc non Af Amer: 51 mL/min — ABNORMAL LOW (ref >60)
Glucose, Bld: 129 mg/dL — ABNORMAL HIGH (ref 70–99)
Potassium: 4.2 mmol/L (ref 3.5–5.1)
Sodium: 158 mmol/L — ABNORMAL HIGH (ref 135–145)
Total Bilirubin: 1.3 mg/dL — ABNORMAL HIGH (ref 0.3–1.2)
Total Protein: 7.7 g/dL (ref 6.5–8.1)

## 2019-09-18 LAB — MAGNESIUM: Magnesium: 2.5 mg/dL — ABNORMAL HIGH (ref 1.7–2.4)

## 2019-09-18 LAB — FERRITIN: Ferritin: 1897 ng/mL — ABNORMAL HIGH (ref 24–336)

## 2019-09-18 LAB — LACTIC ACID, PLASMA: Lactic Acid, Venous: 1.4 mmol/L (ref 0.5–1.9)

## 2019-09-18 LAB — C-REACTIVE PROTEIN: CRP: 7.9 mg/dL — ABNORMAL HIGH (ref <1.0)

## 2019-09-18 LAB — D-DIMER, QUANTITATIVE: D-Dimer, Quant: 2.27 ug/mL-FEU — ABNORMAL HIGH (ref 0.00–0.50)

## 2019-09-18 LAB — PHOSPHORUS: Phosphorus: 1.3 mg/dL — ABNORMAL LOW (ref 2.5–4.6)

## 2019-09-18 MED ORDER — INSULIN ASPART 100 UNIT/ML ~~LOC~~ SOLN
0.0000 [IU] | SUBCUTANEOUS | Status: DC
  Administered 2019-09-18: 5 [IU] via SUBCUTANEOUS
  Administered 2019-09-18: 8 [IU] via SUBCUTANEOUS
  Administered 2019-09-18: 5 [IU] via SUBCUTANEOUS
  Administered 2019-09-19 (×2): 3 [IU] via SUBCUTANEOUS
  Administered 2019-09-19 (×2): 8 [IU] via SUBCUTANEOUS
  Administered 2019-09-19: 3 [IU] via SUBCUTANEOUS
  Administered 2019-09-19 – 2019-09-20 (×3): 5 [IU] via SUBCUTANEOUS
  Administered 2019-09-20 (×2): 3 [IU] via SUBCUTANEOUS
  Administered 2019-09-20: 5 [IU] via SUBCUTANEOUS
  Administered 2019-09-21: 3 [IU] via SUBCUTANEOUS
  Administered 2019-09-21 (×4): 5 [IU] via SUBCUTANEOUS
  Administered 2019-09-21: 8 [IU] via SUBCUTANEOUS
  Administered 2019-09-22: 3 [IU] via SUBCUTANEOUS
  Administered 2019-09-22: 5 [IU] via SUBCUTANEOUS
  Administered 2019-09-22: 3 [IU] via SUBCUTANEOUS
  Administered 2019-09-22: 8 [IU] via SUBCUTANEOUS
  Administered 2019-09-22: 5 [IU] via SUBCUTANEOUS
  Administered 2019-09-22: 11 [IU] via SUBCUTANEOUS
  Administered 2019-09-23: 2 [IU] via SUBCUTANEOUS
  Administered 2019-09-23 (×3): 11 [IU] via SUBCUTANEOUS
  Administered 2019-09-23: 5 [IU] via SUBCUTANEOUS
  Administered 2019-09-24 (×2): 3 [IU] via SUBCUTANEOUS
  Administered 2019-09-24: 11 [IU] via SUBCUTANEOUS
  Administered 2019-09-24: 3 [IU] via SUBCUTANEOUS
  Administered 2019-09-24 – 2019-09-25 (×2): 5 [IU] via SUBCUTANEOUS
  Administered 2019-09-25: 2 [IU] via SUBCUTANEOUS
  Administered 2019-09-25: 11 [IU] via SUBCUTANEOUS
  Administered 2019-09-26: 5 [IU] via SUBCUTANEOUS
  Administered 2019-09-26: 15 [IU] via SUBCUTANEOUS
  Administered 2019-09-26: 2 [IU] via SUBCUTANEOUS
  Administered 2019-09-26: 8 [IU] via SUBCUTANEOUS
  Administered 2019-09-27: 11 [IU] via SUBCUTANEOUS
  Administered 2019-09-27 (×2): 3 [IU] via SUBCUTANEOUS
  Administered 2019-09-27: 2 [IU] via SUBCUTANEOUS
  Administered 2019-09-28 (×2): 3 [IU] via SUBCUTANEOUS
  Administered 2019-09-28: 5 [IU] via SUBCUTANEOUS
  Administered 2019-09-29: 2 [IU] via SUBCUTANEOUS
  Administered 2019-09-29: 3 [IU] via SUBCUTANEOUS
  Administered 2019-09-29: 8 [IU] via SUBCUTANEOUS
  Administered 2019-09-29: 3 [IU] via SUBCUTANEOUS
  Administered 2019-09-30: 2 [IU] via SUBCUTANEOUS
  Administered 2019-09-30 (×2): 3 [IU] via SUBCUTANEOUS
  Administered 2019-10-01 (×3): 2 [IU] via SUBCUTANEOUS
  Administered 2019-10-01: 3 [IU] via SUBCUTANEOUS
  Administered 2019-10-01: 11 [IU] via SUBCUTANEOUS
  Administered 2019-10-02: 2 [IU] via SUBCUTANEOUS
  Administered 2019-10-02: 3 [IU] via SUBCUTANEOUS
  Administered 2019-10-02: 8 [IU] via SUBCUTANEOUS
  Administered 2019-10-02: 2 [IU] via SUBCUTANEOUS
  Administered 2019-10-03: 5 [IU] via SUBCUTANEOUS
  Administered 2019-10-03 (×2): 2 [IU] via SUBCUTANEOUS
  Administered 2019-10-03: 3 [IU] via SUBCUTANEOUS
  Administered 2019-10-03 – 2019-10-04 (×3): 2 [IU] via SUBCUTANEOUS
  Administered 2019-10-04: 5 [IU] via SUBCUTANEOUS
  Administered 2019-10-04: 2 [IU] via SUBCUTANEOUS

## 2019-09-18 MED ORDER — ENOXAPARIN SODIUM 100 MG/ML ~~LOC~~ SOLN
1.0000 mg/kg | Freq: Two times a day (BID) | SUBCUTANEOUS | Status: DC
  Administered 2019-09-18 – 2019-09-24 (×12): 100 mg via SUBCUTANEOUS
  Filled 2019-09-18 (×14): qty 1

## 2019-09-18 MED ORDER — ACETAMINOPHEN 650 MG RE SUPP
650.0000 mg | Freq: Four times a day (QID) | RECTAL | Status: DC | PRN
  Administered 2019-09-19: 650 mg via RECTAL
  Filled 2019-09-18 (×2): qty 1

## 2019-09-18 MED ORDER — INSULIN GLARGINE 100 UNIT/ML ~~LOC~~ SOLN
18.0000 [IU] | Freq: Every day | SUBCUTANEOUS | Status: DC
  Administered 2019-09-18 – 2019-09-21 (×4): 18 [IU] via SUBCUTANEOUS
  Filled 2019-09-18 (×4): qty 0.18

## 2019-09-18 NOTE — Progress Notes (Signed)
Got phone call from [facility]. The family has decided that they would indeed want DNI status but should he have arrhythmia at this point would still want CPR  -I have updated this in the chart  -also they have inquired about moving the patient to a hospital closer to family like [facility] or [facility]. I told Ms Randol Kern I thought that it would be unlikely and that if her were to survive transfer to SNF there might be more realistic but will defer to primary team.   Erick Colace ACNP-BC [place] Pager # [phone] OR # [phone] if no answer

## 2019-09-18 NOTE — Plan of Care (Signed)

## 2019-09-18 NOTE — Progress Notes (Signed)
  [facility] PCCM  Seen briefly on rounds.   Stable overnight and this morning. To PCU PCCM available prn  Roselie Awkward, MD [facility] PCCM Pager: [phone] Cell: [phone] If no response, call [phone]

## 2019-09-18 NOTE — Progress Notes (Signed)
CCMD called this nurse to notify of 11 beath run of SVT with heart rate increasing to 175. Patient returned to [place] 120's which has been his baseline since arriving to [facility]. Patient was being turned and receiving Tylenol suppository to alleviate fever during SVT episode. Patient remains obtunded. Will not answer questions and has not spoken since arrival. Will follow simple commands such as "squeeze my hand" or "wiggle your toes", but will not open eyes. Grip strength weak. Patient pulled off condom catheter, new one replaced. Bed alarm on and patient resting quietly at this time. Will continue to monitor.

## 2019-09-18 NOTE — Progress Notes (Addendum)
Maxwell Miller  VHQ:[id number] DOB: 11-15-1960 DOA: 09/17/2019 PCP: Ancil Boozer, PA-C    Brief Narrative:  58 year old with a history of vascular dementia, nontraumatic intracerebral hemorrhage, portal vein thrombosis on chronic Xarelto, and a diagnosis of COVID-19 10/28 who presented to the ED with 24 hours of severely elevated blood sugars, fever to 105, and altered mental status.  The patient is reportedly conversant at baseline.  Significant Events: 11/5 [facility]  COVID-19 specific Treatment: Remdesivir 11/5 > Decadron 11/5 >  Subjective: The patient does not respond to my voice or exam.  His respirations appear comfortable.  He does not appear to be in uncontrolled pain.  The nurse confirms he has not responded to her either.  Assessment & Plan:  Covid pneumonia - ARDS - acute hypoxic respiratory failure Continue steroids and remdesivir -oxygenation improving with patient now requiring only 2 L conventional nasal cannula -perhaps fever is a consequence of this viral infection alone  SIRS -lactic acidosis Due to above -improving  Acute toxic metabolic encephalopathy No acute findings on CT head -suspect this is metabolic related to SIRS and severe hyperglycemia and hypernatremia/dehydration -continue to treat underlying issues and follow mental status - check ammonia, B12, folate  Portal vein thrombosis Chronically on Xarelto -utilizing Lovenox until patient able to take oral medications  Acute kidney injury  Following with volume resuscitation Recent Labs  Lab 09/17/19 1726 09/17/19 2125 09/18/19 0130 09/18/19 0455 09/18/19 0920  CREATININE 1.55* 1.57* 1.50* 1.49* 1.59*    Polycythemia Likely reflective of severe hemoconcentration/dehydration -follow trend with volume expansion  Recent Labs  Lab 09/17/19 0754 09/18/19 0455  HGB 21.5* 19.8*   Hypernatremia - dehydration Increase free water volume expansion and monitor  trend - patient is severely dehydrated  Uncontrolled DM with severe hyperglycemia CBGs now controlled therefore we will transition off insulin drip -it appears he did not have a prior diagnosis of DM 2 but his elevated A1c at 12.8 confirms this diagnosis -initiate Lantus and sliding scale insulin and follow trend  DVT prophylaxis: Full dose Lovenox Code Status: DO NOT INTUBATE  Family Communication: call placed to family contact w/ no answer 11/6 Disposition Plan: Transfer to progressive care bed  Consultants:  PCCM  Antimicrobials:  Vancomycin 11/5 Cefepime 11/time  Objective: Blood pressure (!) 145/100, pulse (!) 116, temperature 98.8 F (37.1 C), temperature source Oral, resp. rate (!) 22, height 5\' 11"  (1.803 m), weight 98.4 kg, SpO2 97 %.  Intake/Output Summary (Last 24 hours) at 09/18/2019 1305 Last data filed at 09/18/2019 1200 Gross per 24 hour  Intake 2456.76 ml  Output 550 ml  Net 1906.76 ml   Filed Weights   09/17/19 0822 09/17/19 2200  Weight: (S) 81.6 kg 98.4 kg    Examination: General: No acute respiratory distress -obtunded Lungs: Clear to auscultation bilaterally without wheezes or crackles Cardiovascular: Regular rate and rhythm without murmur gallop or rub normal S1 and S2 Abdomen: Nontender, nondistended, soft, bowel sounds positive, no rebound, no ascites, no appreciable mass Extremities: No significant cyanosis, clubbing, or edema bilateral lower extremities  CBC: Recent Labs  Lab 09/17/19 0754 09/18/19 0455  WBC 6.3 6.6  NEUTROABS 5.0 3.8  HGB 21.5* 19.8*  HCT 69.4* 65.4*  MCV 91.3 91.7  PLT 115* 413*   Basic Metabolic Panel: Recent Labs  Lab 09/18/19 0130 09/18/19 0455 09/18/19 0920  NA 158* 158* 156*  K 4.8 4.2 4.4  CL 123* 124* 124*  CO2 25 25 24  GLUCOSE 110* 129* 138*  BUN 30* 29* 31*  CREATININE 1.50* 1.49* 1.59*  CALCIUM 8.5* 8.5* 8.6*  MG  --  2.5*  --   PHOS  --  1.3*  --    GFR: Estimated Creatinine Clearance: 61.3  mL/min (A) (by C-G formula based on SCr of 1.59 mg/dL (H)).  Liver Function Tests: Recent Labs  Lab 09/17/19 0754 09/18/19 0455  AST 41 37  ALT 27 21  ALKPHOS 67 52  BILITOT 1.7* 1.3*  PROT 8.8* 7.7  ALBUMIN 3.5 3.0*    Coagulation Profile: Recent Labs  Lab 09/17/19 0754  INR 1.5*    HbA1C: Hgb A1c MFr Bld  Date/Time Value Ref Range Status  09/17/2019 12:42 PM 12.8 (H) 4.8 - 5.6 % Final    Comment:    (NOTE) Pre diabetes:          5.7%-6.4% Diabetes:              >6.4% Glycemic control for   <7.0% adults with diabetes     CBG: Recent Labs  Lab 09/18/19 0808 09/18/19 0920 09/18/19 1025 09/18/19 1124 09/18/19 1229  GLUCAP 149* 148* 149* 145* 162*    Recent Results (from the past 240 hour(s))  Blood Culture (routine x 2)     Status: None (Preliminary result)   Collection Time: 09/17/19  7:54 AM   Specimen: BLOOD  Result Value Ref Range Status   Specimen Description   Final    BLOOD RIGHT ANTECUBITAL Performed at [facility], [address] [place], [place] [address]    Special Requests   Final    BOTTLES DRAWN AEROBIC AND ANAEROBIC Blood Culture adequate volume Performed at [facility], [address] [place], [place] [address]    Culture   Final    NO GROWTH 1 DAY Performed at [facility], [address] [place], [place] [address]    Report Status PENDING  Incomplete  Urine culture     Status: None   Collection Time: 09/17/19  7:54 AM   Specimen: In/Out Cath Urine  Result Value Ref Range Status   Specimen Description   Final    IN/OUT CATH URINE Performed at [facility], [address] [place], [place] [address]    Special Requests   Final    NONE Performed at [facility], [address] [place], [place] [address]    Culture   Final    NO GROWTH Performed at [facility], [address] [place], [place] [address]    Report Status 09/18/2019  FINAL  Final  Blood Culture (routine x 2)     Status: None (Preliminary result)   Collection Time: 09/17/19  7:57 AM   Specimen: BLOOD  Result Value Ref Range Status   Specimen Description   Final    BLOOD LEFT ANTECUBITAL Performed at [facility], [address] [place], [place] [address]    Special Requests   Final    BOTTLES DRAWN AEROBIC AND ANAEROBIC Blood Culture adequate volume Performed at [facility], [address] [place], [place] [address]    Culture   Final    NO GROWTH 1 DAY Performed at [facility], [address] [place], [place] [address]    Report Status PENDING  Incomplete  SARS CORONAVIRUS 2 (TAT 6-24 HRS) Nasopharyngeal Nasopharyngeal Swab     Status: Abnormal   Collection Time: 09/17/19  10:22 AM   Specimen: Nasopharyngeal Swab  Result Value Ref Range Status   SARS Coronavirus 2 POSITIVE (A) NEGATIVE Final    Comment: RESULT CALLED TO, READ BACK BY AND VERIFIED WITH: S.WEST,RN [id number] I.MANNING (NOTE) SARS-CoV-2 target nucleic acids are DETECTED. The SARS-CoV-2 RNA is generally detectable in upper and lower respiratory specimens during the acute phase of infection. Positive results are indicative of active infection with SARS-CoV-2. Clinical  correlation with patient history and other diagnostic information is necessary to determine patient infection status. Positive results do  not rule out bacterial infection or co-infection with other viruses. The expected result is Negative. Fact Sheet for Patients: HairSlick.no Fact Sheet for Healthcare Providers: quierodirigir.com This test is not yet approved or cleared by the [place] FDA and  has been authorized for detection and/or diagnosis of SARS-CoV-2 by FDA under an Emergency Use Authorization (EUA). This EUA will remain  in effect (meaning this test can be used) for the  duration of the  COVID-19 declaration under Section 564(b)(1) of the Act, 21 U.S.C. section 360bbb-3(b)(1), unless the authorization is terminated or revoked sooner. Performed at [facility], [address] [place], [place] [address]   MRSA PCR Screening     Status: None   Collection Time: 09/17/19  8:15 PM   Specimen: Nasal Mucosa; Nasopharyngeal  Result Value Ref Range Status   MRSA by PCR NEGATIVE NEGATIVE Final    Comment:        The GeneXpert MRSA Assay (FDA approved for NASAL specimens only), is one component of a comprehensive MRSA colonization surveillance program. It is not intended to diagnose MRSA infection nor to guide or monitor treatment for MRSA infections. Performed at [facility], [address], [place], [place] [address]      Scheduled Meds: . Chlorhexidine Gluconate Cloth  6 each Topical Daily  . dexamethasone (DECADRON) injection  6 mg Intravenous Q24H  . enoxaparin (LOVENOX) injection  1 mg/kg Subcutaneous Q12H  . mouth rinse  15 mL Mouth Rinse BID  . thiamine injection  100 mg Intravenous Daily  . vitamin C  500 mg Oral Daily  . zinc sulfate  220 mg Oral Daily   Continuous Infusions: . sodium chloride 75 mL/hr at 09/17/19 2100  . sodium chloride Stopped (09/18/19 1144)  . dextrose 5 % and 0.45% NaCl 75 mL/hr at 09/18/19 1200  . famotidine (PEPCID) IV Stopped (09/18/19 1138)  . insulin 3 mL/hr at 09/18/19 1200  . lactated ringers Stopped (09/17/19 1335)  . remdesivir 100 mg in NS 250 mL 500 mL/hr at 09/18/19 1200     LOS: 1 day   Lonia Blood, MD [facility] Office  [phone] Pager - Text Page per Amion  If 7PM-7AM, please contact night-coverage per Amion 09/18/2019, 1:05 PM

## 2019-09-19 DIAGNOSIS — R739 Hyperglycemia, unspecified: Secondary | ICD-10-CM | POA: Diagnosis not present

## 2019-09-19 DIAGNOSIS — D751 Secondary polycythemia: Secondary | ICD-10-CM | POA: Diagnosis not present

## 2019-09-19 DIAGNOSIS — U071 COVID-19: Secondary | ICD-10-CM | POA: Diagnosis not present

## 2019-09-19 DIAGNOSIS — G9341 Metabolic encephalopathy: Secondary | ICD-10-CM | POA: Diagnosis not present

## 2019-09-19 LAB — COMPREHENSIVE METABOLIC PANEL
ALT: 22 U/L (ref 0–44)
AST: 36 U/L (ref 15–41)
Albumin: 2.8 g/dL — ABNORMAL LOW (ref 3.5–5.0)
Alkaline Phosphatase: 53 U/L (ref 38–126)
Anion gap: 11 (ref 5–15)
BUN: 33 mg/dL — ABNORMAL HIGH (ref 6–20)
CO2: 26 mmol/L (ref 22–32)
Calcium: 8.4 mg/dL — ABNORMAL LOW (ref 8.9–10.3)
Chloride: 123 mmol/L — ABNORMAL HIGH (ref 98–111)
Creatinine, Ser: 1.67 mg/dL — ABNORMAL HIGH (ref 0.61–1.24)
GFR calc Af Amer: 52 mL/min — ABNORMAL LOW (ref >60)
GFR calc non Af Amer: 45 mL/min — ABNORMAL LOW (ref >60)
Glucose, Bld: 174 mg/dL — ABNORMAL HIGH (ref 70–99)
Potassium: 4.4 mmol/L (ref 3.5–5.1)
Sodium: 160 mmol/L — ABNORMAL HIGH (ref 135–145)
Total Bilirubin: 1 mg/dL (ref 0.3–1.2)
Total Protein: 7.2 g/dL (ref 6.5–8.1)

## 2019-09-19 LAB — CBC WITH DIFFERENTIAL/PLATELET
Abs Immature Granulocytes: 0.03 10*3/uL (ref 0.00–0.07)
Basophils Absolute: 0.1 10*3/uL (ref 0.0–0.1)
Basophils Relative: 1 %
Eosinophils Absolute: 0 10*3/uL (ref 0.0–0.5)
Eosinophils Relative: 0 %
HCT: 64.4 % — ABNORMAL HIGH (ref 39.0–52.0)
Hemoglobin: 19.6 g/dL — ABNORMAL HIGH (ref 13.0–17.0)
Immature Granulocytes: 0 %
Lymphocytes Relative: 22 %
Lymphs Abs: 1.8 10*3/uL (ref 0.7–4.0)
MCH: 28.3 pg (ref 26.0–34.0)
MCHC: 30.4 g/dL (ref 30.0–36.0)
MCV: 92.9 fL (ref 80.0–100.0)
Monocytes Absolute: 0.9 10*3/uL (ref 0.1–1.0)
Monocytes Relative: 10 %
Neutro Abs: 5.7 10*3/uL (ref 1.7–7.7)
Neutrophils Relative %: 67 %
Platelets: 127 10*3/uL — ABNORMAL LOW (ref 150–400)
RBC: 6.93 MIL/uL — ABNORMAL HIGH (ref 4.22–5.81)
RDW: 18.6 % — ABNORMAL HIGH (ref 11.5–15.5)
WBC: 8.4 10*3/uL (ref 4.0–10.5)
nRBC: 0 % (ref 0.0–0.2)

## 2019-09-19 LAB — IRON AND TIBC
Iron: 118 ug/dL (ref 45–182)
Saturation Ratios: 67 % — ABNORMAL HIGH (ref 17.9–39.5)
TIBC: 175 ug/dL — ABNORMAL LOW (ref 250–450)
UIBC: 57 ug/dL

## 2019-09-19 LAB — RETICULOCYTES
Immature Retic Fract: 8.7 % (ref 2.3–15.9)
RBC.: 6.93 MIL/uL — ABNORMAL HIGH (ref 4.22–5.81)
Retic Count, Absolute: 20.8 10*3/uL (ref 19.0–186.0)
Retic Ct Pct: 0.8 % (ref 0.4–3.1)

## 2019-09-19 LAB — BASIC METABOLIC PANEL
Anion gap: 11 (ref 5–15)
BUN: 33 mg/dL — ABNORMAL HIGH (ref 6–20)
CO2: 25 mmol/L (ref 22–32)
Calcium: 8.1 mg/dL — ABNORMAL LOW (ref 8.9–10.3)
Chloride: 119 mmol/L — ABNORMAL HIGH (ref 98–111)
Creatinine, Ser: 1.65 mg/dL — ABNORMAL HIGH (ref 0.61–1.24)
GFR calc Af Amer: 53 mL/min — ABNORMAL LOW (ref >60)
GFR calc non Af Amer: 45 mL/min — ABNORMAL LOW (ref >60)
Glucose, Bld: 318 mg/dL — ABNORMAL HIGH (ref 70–99)
Potassium: 4 mmol/L (ref 3.5–5.1)
Sodium: 155 mmol/L — ABNORMAL HIGH (ref 135–145)

## 2019-09-19 LAB — MAGNESIUM: Magnesium: 2.2 mg/dL (ref 1.7–2.4)

## 2019-09-19 LAB — FERRITIN: Ferritin: 1222 ng/mL — ABNORMAL HIGH (ref 24–336)

## 2019-09-19 LAB — GLUCOSE, CAPILLARY
Glucose-Capillary: 169 mg/dL — ABNORMAL HIGH (ref 70–99)
Glucose-Capillary: 192 mg/dL — ABNORMAL HIGH (ref 70–99)
Glucose-Capillary: 287 mg/dL — ABNORMAL HIGH (ref 70–99)

## 2019-09-19 LAB — VITAMIN B12: Vitamin B-12: 773 pg/mL (ref 180–914)

## 2019-09-19 LAB — FOLATE: Folate: 18.1 ng/mL (ref >5.9)

## 2019-09-19 LAB — D-DIMER, QUANTITATIVE: D-Dimer, Quant: 1.42 ug/mL-FEU — ABNORMAL HIGH (ref 0.00–0.50)

## 2019-09-19 LAB — AMMONIA: Ammonia: 60 umol/L — ABNORMAL HIGH (ref 9–35)

## 2019-09-19 LAB — PHOSPHORUS: Phosphorus: 2.2 mg/dL — ABNORMAL LOW (ref 2.5–4.6)

## 2019-09-19 LAB — C-REACTIVE PROTEIN: CRP: 4 mg/dL — ABNORMAL HIGH (ref <1.0)

## 2019-09-19 MED ORDER — SODIUM CHLORIDE 0.45 % IV BOLUS
500.0000 mL | Freq: Once | INTRAVENOUS | Status: AC
  Administered 2019-09-19: 500 mL via INTRAVENOUS

## 2019-09-19 MED ORDER — METOPROLOL TARTRATE 5 MG/5ML IV SOLN
5.0000 mg | Freq: Three times a day (TID) | INTRAVENOUS | Status: DC
  Administered 2019-09-19 – 2019-09-27 (×24): 5 mg via INTRAVENOUS
  Filled 2019-09-19 (×24): qty 5

## 2019-09-19 MED ORDER — DEXTROSE 5 % IV SOLN
INTRAVENOUS | Status: DC
  Administered 2019-09-19 (×2): via INTRAVENOUS

## 2019-09-19 MED ORDER — SODIUM CHLORIDE 0.45 % IV SOLN
INTRAVENOUS | Status: DC
  Administered 2019-09-19 – 2019-09-20 (×2): via INTRAVENOUS

## 2019-09-19 NOTE — Plan of Care (Signed)

## 2019-09-19 NOTE — Progress Notes (Signed)
Called and updated patient's niece, Maxwell Miller. All questions were answered and she was extremely appreciative for the call. I gave her the main desk number to call to check on her uncle. She said she had spoken to Dr. Thereasa Solo earlier today and they talked about a temporary feeding tube. She also asked about his sodium levels which I relayed to her. She again thanked me for the phone call.

## 2019-09-19 NOTE — Progress Notes (Addendum)
Maxwell Miller  IWP:[id number] DOB: February 02, 1961 DOA: 09/17/2019 PCP: Mable Paris, PA-C    Brief Narrative:  58 year old [facility] resident with a history of vascular dementia, nontraumatic intracerebral hemorrhage, portal vein thrombosis on chronic Xarelto, and a diagnosis of COVID-19 10/28 who presented to the ED with 24 hours of severely elevated blood sugars, fever to 105, and altered mental status.  The patient is reportedly conversant at baseline.  Significant Events: 11/5 admit to [facility] ICU via [facility] ED 11/6 transfer to progressive care unit  COVID-19 specific Treatment: Remdesivir 11/5 > Decadron 11/5 >  Subjective: Remains noncommunicative.  Nurse reports he will open her eyes to her but does not do the same to me.  No evidence of respiratory distress.  No evidence of uncontrolled pain.  Assessment & Plan:  Covid pneumonia - ARDS - acute hypoxic respiratory failure Continue steroids and remdesivir - oxygenation markedly improved with patient now on room air - suspect fever is a consequence of this viral infection alone  SIRS - lactic acidosis Due to above  Acute toxic metabolic encephalopathy No acute findings on CT head -suspect this is metabolic related to SIRS and severe hyperglycemia and hypernatremia/dehydration and elevated ammonia -continue to treat underlying issues and follow mental status - B12, folate both normal  Elevated ammonia Hydrate - follow - consider lactulose if able to take orals and if remains elevated   Portal vein thrombosis Chronically on Xarelto -utilizing Lovenox until patient able to take oral medications  Acute kidney injury  Following with volume resuscitation -remains significantly dehydrated -adjusting IV therapies today  Recent Labs  Lab 09/18/19 0130 09/18/19 0455 09/18/19 0920 09/18/19 1316 09/19/19 0509  CREATININE 1.50* 1.49* 1.59* 1.68* 1.67*    Polycythemia Likely reflective of severe  hemoconcentration/dehydration -follow trend with volume expansion  Recent Labs  Lab 09/17/19 0754 09/18/19 0455 09/19/19 0509  HGB 21.5* 19.8* 19.6*   Hypernatremia - dehydration patient is severely dehydrated -increase free water via IV and follow  Uncontrolled DM with severe hyperglycemia appears he did not have a prior diagnosis of DM 2 but his A1c at 12.8 confirms this diagnosis -CBG much improved at this time  DVT prophylaxis: Full dose Lovenox Code Status: DO NOT INTUBATE  Family Communication:  Disposition Plan: Progressive care  Consultants:  PCCM  Antimicrobials:  Vancomycin 11/5 Cefepime 11/5  Objective: Blood pressure (!) 129/100, pulse (!) 112, temperature 99.6 F (37.6 C), temperature source Axillary, resp. rate 19, height 5\' 11"  (1.803 m), weight 98.4 kg, SpO2 96 %.  Intake/Output Summary (Last 24 hours) at 09/19/2019 0935 Last data filed at 09/18/2019 1700 Gross per 24 hour  Intake 1094.56 ml  Output 300 ml  Net 794.56 ml   Filed Weights   09/17/19 0822 09/17/19 2200  Weight: (S) 81.6 kg 98.4 kg    Examination: General: Remains obtunded -no acute respiratory distress Lungs: Good air movement throughout bilateral fields without wheezing or crackles Cardiovascular: Tachycardic but regular without murmur or rub Abdomen: Nondistended, soft, bowel sounds positive, no rebound Extremities: No significant edema bilateral lower extremities  CBC: Recent Labs  Lab 09/17/19 0754 09/18/19 0455 09/19/19 0509  WBC 6.3 6.6 8.4  NEUTROABS 5.0 3.8 5.7  HGB 21.5* 19.8* 19.6*  HCT 69.4* 65.4* 64.4*  MCV 91.3 91.7 92.9  PLT 115* 115* 127*   Basic Metabolic Panel: Recent Labs  Lab 09/18/19 0455 09/18/19 0920 09/18/19 1316 09/19/19 0509  NA 158* 156* 156* 160*  K 4.2 4.4 4.6 4.4  CL 124* 124* 125* 123*  CO2 25 24 25 26   GLUCOSE 129* 138* 151* 174*  BUN 29* 31* 32* 33*  CREATININE 1.49* 1.59* 1.68* 1.67*  CALCIUM 8.5* 8.6* 8.3* 8.4*  MG 2.5*  --   --   2.2  PHOS 1.3*  --   --  2.2*   GFR: Estimated Creatinine Clearance: 58.3 mL/min (A) (by C-G formula based on SCr of 1.67 mg/dL (H)).  Liver Function Tests: Recent Labs  Lab 09/17/19 0754 09/18/19 0455 09/19/19 0509  AST 41 37 36  ALT 27 21 22   ALKPHOS 67 52 53  BILITOT 1.7* 1.3* 1.0  PROT 8.8* 7.7 7.2  ALBUMIN 3.5 3.0* 2.8*    Coagulation Profile: Recent Labs  Lab 09/17/19 0754  INR 1.5*    HbA1C: Hgb A1c MFr Bld  Date/Time Value Ref Range Status  09/17/2019 12:42 PM 12.8 (H) 4.8 - 5.6 % Final    Comment:    (NOTE) Pre diabetes:          5.7%-6.4% Diabetes:              >6.4% Glycemic control for   <7.0% adults with diabetes     CBG: Recent Labs  Lab 09/18/19 1333 09/18/19 1555 09/18/19 1959 09/18/19 2325 09/19/19 0753  GLUCAP 143* 242* 276* 235* 169*    Recent Results (from the past 240 hour(s))  Blood Culture (routine x 2)     Status: None (Preliminary result)   Collection Time: 09/17/19  7:54 AM   Specimen: BLOOD  Result Value Ref Range Status   Specimen Description   Final    BLOOD RIGHT ANTECUBITAL Performed at [facility], [address] [place], [place] [address]    Special Requests   Final    BOTTLES DRAWN AEROBIC AND ANAEROBIC Blood Culture adequate volume Performed at [facility], [address] [place], [place] [address]    Culture   Final    NO GROWTH 2 DAYS Performed at [facility], [address] [place], [place] [address]    Report Status PENDING  Incomplete  Urine culture     Status: None   Collection Time: 09/17/19  7:54 AM   Specimen: In/Out Cath Urine  Result Value Ref Range Status   Specimen Description   Final    IN/OUT CATH URINE Performed at [facility], [address] [place], [place] [address]    Special Requests   Final    NONE Performed at [facility], [address] [place], [place] [address]    Culture   Final     NO GROWTH Performed at [facility], [address] [place], [place] [address]    Report Status 09/18/2019 FINAL  Final  Blood Culture (routine x 2)     Status: None (Preliminary result)   Collection Time: 09/17/19  7:57 AM   Specimen: BLOOD  Result Value Ref Range Status   Specimen Description   Final    BLOOD LEFT ANTECUBITAL Performed at [facility], [address] [place], [place] [address]    Special Requests   Final    BOTTLES DRAWN AEROBIC AND ANAEROBIC Blood Culture adequate volume Performed at [facility], [address] [place], [place] [address]    Culture   Final    NO GROWTH 2 DAYS Performed at [facility], [address] [place], [place] [address]  Report Status PENDING  Incomplete  SARS CORONAVIRUS 2 (TAT 6-24 HRS) Nasopharyngeal Nasopharyngeal Swab     Status: Abnormal   Collection Time: 09/17/19 10:22 AM   Specimen: Nasopharyngeal Swab  Result Value Ref Range Status   SARS Coronavirus 2 POSITIVE (A) NEGATIVE Final    Comment: RESULT CALLED TO, READ BACK BY AND VERIFIED WITH: S.WEST,RN [id number] I.MANNING (NOTE) SARS-CoV-2 target nucleic acids are DETECTED. The SARS-CoV-2 RNA is generally detectable in upper and lower respiratory specimens during the acute phase of infection. Positive results are indicative of active infection with SARS-CoV-2. Clinical  correlation with patient history and other diagnostic information is necessary to determine patient infection status. Positive results do  not rule out bacterial infection or co-infection with other viruses. The expected result is Negative. Fact Sheet for Patients: SugarRoll.be Fact Sheet for Healthcare Providers: https://www.woods-mathews.com/ This test is not yet approved or cleared by the [place] FDA and  has been authorized for detection and/or diagnosis of SARS-CoV-2 by FDA under an Emergency Use  Authorization (EUA). This EUA will remain  in effect (meaning this test can be used) for the  duration of the COVID-19 declaration under Section 564(b)(1) of the Act, 21 U.S.C. section 360bbb-3(b)(1), unless the authorization is terminated or revoked sooner. Performed at [facility], [place] [address], [place], [place] [address]   MRSA PCR Screening     Status: None   Collection Time: 09/17/19  8:15 PM   Specimen: Nasal Mucosa; Nasopharyngeal  Result Value Ref Range Status   MRSA by PCR NEGATIVE NEGATIVE Final    Comment:        The GeneXpert MRSA Assay (FDA approved for NASAL specimens only), is one component of a comprehensive MRSA colonization surveillance program. It is not intended to diagnose MRSA infection nor to guide or monitor treatment for MRSA infections. Performed at [facility], [place] [address] [place], [place] [address]      Scheduled Meds: . Chlorhexidine Gluconate Cloth  6 each Topical Daily  . dexamethasone (DECADRON) injection  6 mg Intravenous Q24H  . enoxaparin (LOVENOX) injection  1 mg/kg Subcutaneous Q12H  . insulin aspart  0-15 Units Subcutaneous Q4H  . insulin glargine  18 Units Subcutaneous Daily  . mouth rinse  15 mL Mouth Rinse BID  . thiamine injection  100 mg Intravenous Daily    LOS: 2 days   Cherene Altes, MD [facility] Office  [phone] Pager - Text Page per Shea Evans  If 7PM-7AM, please contact night-coverage per Amion 09/19/2019, 9:35 AM

## 2019-09-19 NOTE — Progress Notes (Signed)
Notified MD RED MEWS score. Monitoring VS .

## 2019-09-20 DIAGNOSIS — E860 Dehydration: Secondary | ICD-10-CM | POA: Diagnosis not present

## 2019-09-20 DIAGNOSIS — I10 Essential (primary) hypertension: Secondary | ICD-10-CM

## 2019-09-20 DIAGNOSIS — R739 Hyperglycemia, unspecified: Secondary | ICD-10-CM | POA: Diagnosis not present

## 2019-09-20 DIAGNOSIS — G9341 Metabolic encephalopathy: Secondary | ICD-10-CM | POA: Diagnosis not present

## 2019-09-20 LAB — CBC WITH DIFFERENTIAL/PLATELET
Abs Immature Granulocytes: 0.03 10*3/uL (ref 0.00–0.07)
Basophils Absolute: 0 10*3/uL (ref 0.0–0.1)
Basophils Relative: 0 %
Eosinophils Absolute: 0.2 10*3/uL (ref 0.0–0.5)
Eosinophils Relative: 3 %
HCT: 58.4 % — ABNORMAL HIGH (ref 39.0–52.0)
Hemoglobin: 17.9 g/dL — ABNORMAL HIGH (ref 13.0–17.0)
Immature Granulocytes: 0 %
Lymphocytes Relative: 20 %
Lymphs Abs: 1.4 10*3/uL (ref 0.7–4.0)
MCH: 28.1 pg (ref 26.0–34.0)
MCHC: 30.7 g/dL (ref 30.0–36.0)
MCV: 91.8 fL (ref 80.0–100.0)
Monocytes Absolute: 0.7 10*3/uL (ref 0.1–1.0)
Monocytes Relative: 9 %
Neutro Abs: 4.9 10*3/uL (ref 1.7–7.7)
Neutrophils Relative %: 68 %
Platelets: 134 10*3/uL — ABNORMAL LOW (ref 150–400)
RBC: 6.36 MIL/uL — ABNORMAL HIGH (ref 4.22–5.81)
RDW: 17.9 % — ABNORMAL HIGH (ref 11.5–15.5)
WBC: 7.2 10*3/uL (ref 4.0–10.5)
nRBC: 0 % (ref 0.0–0.2)

## 2019-09-20 LAB — C-REACTIVE PROTEIN: CRP: 2.6 mg/dL — ABNORMAL HIGH (ref <1.0)

## 2019-09-20 LAB — COMPREHENSIVE METABOLIC PANEL
ALT: 22 U/L (ref 0–44)
AST: 35 U/L (ref 15–41)
Albumin: 2.7 g/dL — ABNORMAL LOW (ref 3.5–5.0)
Alkaline Phosphatase: 51 U/L (ref 38–126)
Anion gap: 11 (ref 5–15)
BUN: 33 mg/dL — ABNORMAL HIGH (ref 6–20)
CO2: 25 mmol/L (ref 22–32)
Calcium: 8.5 mg/dL — ABNORMAL LOW (ref 8.9–10.3)
Chloride: 120 mmol/L — ABNORMAL HIGH (ref 98–111)
Creatinine, Ser: 1.33 mg/dL — ABNORMAL HIGH (ref 0.61–1.24)
GFR calc Af Amer: >60 mL/min (ref >60)
GFR calc non Af Amer: 59 mL/min — ABNORMAL LOW (ref >60)
Glucose, Bld: 199 mg/dL — ABNORMAL HIGH (ref 70–99)
Potassium: 4.3 mmol/L (ref 3.5–5.1)
Sodium: 156 mmol/L — ABNORMAL HIGH (ref 135–145)
Total Bilirubin: 1 mg/dL (ref 0.3–1.2)
Total Protein: 6.9 g/dL (ref 6.5–8.1)

## 2019-09-20 LAB — D-DIMER, QUANTITATIVE: D-Dimer, Quant: 0.75 ug/mL-FEU — ABNORMAL HIGH (ref 0.00–0.50)

## 2019-09-20 LAB — GLUCOSE, CAPILLARY
Glucose-Capillary: 172 mg/dL — ABNORMAL HIGH (ref 70–99)
Glucose-Capillary: 178 mg/dL — ABNORMAL HIGH (ref 70–99)
Glucose-Capillary: 228 mg/dL — ABNORMAL HIGH (ref 70–99)
Glucose-Capillary: 236 mg/dL — ABNORMAL HIGH (ref 70–99)
Glucose-Capillary: 240 mg/dL — ABNORMAL HIGH (ref 70–99)
Glucose-Capillary: 245 mg/dL — ABNORMAL HIGH (ref 70–99)

## 2019-09-20 LAB — MAGNESIUM: Magnesium: 2.3 mg/dL (ref 1.7–2.4)

## 2019-09-20 LAB — FERRITIN: Ferritin: 790 ng/mL — ABNORMAL HIGH (ref 24–336)

## 2019-09-20 LAB — PHOSPHORUS: Phosphorus: 2.6 mg/dL (ref 2.5–4.6)

## 2019-09-20 LAB — AMMONIA: Ammonia: 26 umol/L (ref 9–35)

## 2019-09-20 LAB — PROCALCITONIN: Procalcitonin: 0.1 ng/mL

## 2019-09-20 MED ORDER — DEXTROSE 5 % IV SOLN
INTRAVENOUS | Status: DC
  Administered 2019-09-20 – 2019-09-21 (×3): via INTRAVENOUS

## 2019-09-20 NOTE — Progress Notes (Signed)
Patient resting comfortably, no respiratory distress, condom cath on, bilateral mitts on to prevent pulling the catheter off.  Call made to update niece Myrna, no answer, left general voicemail, will continue to monitor.

## 2019-09-20 NOTE — Progress Notes (Signed)
While  at bedside to measure urine output , noted him  Opening eyes and turning over on his side and also noted him to pull off condom catheter. Attempts made to educate on the intended use of the condom catheter for incontinence, he lays in bed w/ eyes closed during verbal communication. He only opens his eyes and turns over as this nurse walks away . Will not respond to any questions or follow any verbal commands at this time. Refusing mouthcare while clenching teeth together and not allowing mouthcare to be done. Continuing to monitor.

## 2019-09-20 NOTE — Plan of Care (Signed)

## 2019-09-20 NOTE — Progress Notes (Signed)
While turning patient to change his bedpads and clean him up after being incontinent of urine, patient opened eyes and said "yes" when I called out his name. He quickly closed his eyes and did not say anything further, however, he was able to follow some commands and assist with turning from side to side. He also nodded when asked if he was warm enough.

## 2019-09-20 NOTE — Progress Notes (Signed)
ANTICOAGULATION CONSULT NOTE - Follow Up Consult  Pharmacy Consult for Lovenox Indication: VTE prophylaxis  (PTA Xarelto for portal vein thrombosis)  No Known Allergies  Patient Measurements: Height: 5\' 11"  (180.3 cm) Weight: 216 lb 14.9 oz (98.4 kg) IBW/kg (Calculated) : 75.3 Heparin Dosing Weight:  Vital Signs: Temp: 98.7 F (37.1 C) (11/08 1141) Temp Source: Axillary (11/08 1141) BP: 142/100 (11/08 1141) Pulse Rate: 72 (11/08 1141)  Labs: Recent Labs    09/18/19 0455  09/19/19 0509 09/19/19 1750 09/20/19 0409  HGB 19.8*  --  19.6*  --  17.9*  HCT 65.4*  --  64.4*  --  58.4*  PLT 115*  --  127*  --  134*  CREATININE 1.49*   < > 1.67* 1.65* 1.33*   < > = values in this interval not displayed.    Estimated Creatinine Clearance: 73.2 mL/min (A) (by C-G formula based on SCr of 1.33 mg/dL (H)).   Medications:  Scheduled:  . Chlorhexidine Gluconate Cloth  6 each Topical Daily  . dexamethasone (DECADRON) injection  6 mg Intravenous Q24H  . enoxaparin (LOVENOX) injection  1 mg/kg Subcutaneous Q12H  . insulin aspart  0-15 Units Subcutaneous Q4H  . insulin glargine  18 Units Subcutaneous Daily  . mouth rinse  15 mL Mouth Rinse BID  . metoprolol tartrate  5 mg Intravenous Q8H  . thiamine injection  100 mg Intravenous Daily   Infusions:  . dextrose 100 mL/hr at 09/20/19 1212  . famotidine (PEPCID) IV 20 mg (09/20/19 0931)  . remdesivir 100 mg in NS 250 mL 100 mg (09/20/19 1141)    Assessment: 49 yoM admitted on 11/5 with COVID-19 pneumonia.  PMH is significant for portal vein thrombosis on chronic Xarelto anticoagulation.  He was transitioned to Lovenox until able to take PO medications.   SCr decreased to 1.33, CrCl > 30 ml/min.  Hgb remains elevated at 17.9 and Plt remain low/improved at 134k.  No bleeding or complications reported.  D-dimer 0.75.  Goal of Therapy:  Anti-Xa level 0.6-1 units/ml 4hrs after LMWH dose given Monitor platelets by anticoagulation  protocol: Yes   Plan:  Continue Lovenox 100 mg SQ q12h  Follow renal function, signs and symptoms of bleeding CBC q72h  Gretta Arab PharmD, BCPS Clinical pharmacist phone 7am- 5pm: [phone] 09/20/2019 1:51 PM

## 2019-09-20 NOTE — Progress Notes (Signed)
Refused mouthcare today by clenching teeth down hard. Continues to remove condom catheter not long after it is placed and voiding all over the bed as he did yesterday as well. Opens eyes to verbal stimuli at times and other times as if he does not hear what others are saying, but was just awake prior. Attempted to state his name earlier at lunch when assessing mental status. Continuing to monitor.

## 2019-09-20 NOTE — Progress Notes (Signed)
Maxwell Miller  BJY:[id number] DOB: January 29, 1961 DOA: 09/17/2019 PCP: Mable Paris, PA-C    Brief Narrative:  58 year old [facility] resident with a history of vascular dementia, nontraumatic intracerebral hemorrhage, portal vein thrombosis on chronic Xarelto, and a diagnosis of COVID-19 10/28 who presented to the ED with 24 hours of severely elevated blood sugars, fever to 105, and altered mental status.  The patient is reportedly conversant at baseline.  Significant Events: 11/5 admit to [facility] ICU via [facility] long ED 11/6 transfer to progressive care unit  COVID-19 specific Treatment: Remdesivir 11/5 > Decadron 11/5 >  Subjective: Patient remains noncommunicative, significant events overnight per staff, patient open eyes occasionally, and turn himself, but does not follow commands or respond .  Assessment & Plan:  Covid pneumonia - ARDS - acute hypoxic respiratory failure -Continue with steroids  -Continue with remdesivir  -He is currently on room air  -Continue to follow inflammatory markers closely, trending down which is reassuring . -Follow procalcitonin, but so far I see no indication for antibiotics COVID-19 Labs  Recent Labs    09/18/19 0455 09/18/19 0815 09/19/19 0509 09/20/19 0409  DDIMER DUE TO ELEVATED HEMATOCRIT, BLOOD:ANTICOAGULANT RATIO IN TUBE IS UNSATISFACTORY. RESULTS MAY BE AFFECTED. PLEASE RECOLLECT IN ANTICOAGULANT-ADJUSTED TUBE, OBTAINABLE FROM THE LAB. 2.27* 1.42* 0.75*  FERRITIN 1,897*  --  1,222* 790*  CRP 7.9*  --  4.0* 2.6*    Lab Results  Component Value Date   SARSCOV2NAA POSITIVE (A) 09/17/2019   SARSCOV2NAA NOT DETECTED 03/30/2019   SARSCOV2NAA NOT DETECTED 03/06/2019     SIRS - lactic acidosis Due to above  Acute toxic metabolic encephalopathy - No acute findings on CT head  -This is most likely metabolic encephalopathy in the setting of electrolyte derangement, mainly hypernatremia and COVID-19 infection.  As  well severe hyperglycemia and dehydration with elevated ammonia level. -Patient had elevated ammonia level yesterday, but this has normalized today. - B12, folate both normal  Elevated ammonia -This has normalized  Portal vein thrombosis Chronically on Xarelto -utilizing Lovenox until patient able to take oral medications  Acute kidney injury  Following with volume resuscitation -remains significantly dehydrated -adjusting IV therapies today  Recent Labs  Lab 09/18/19 0920 09/18/19 1316 09/19/19 0509 09/19/19 1750 09/20/19 0409  CREATININE 1.59* 1.68* 1.67* 1.65* 1.33*    Polycythemia Likely reflective of severe hemoconcentration/dehydration -follow trend with volume expansion  Recent Labs  Lab 09/17/19 0754 09/18/19 0455 09/19/19 0509 09/20/19 0409  HGB 21.5* 19.8* 19.6* 17.9*   Hypernatremia - dehydration -Continue with IV fluids, I will change his fluid to D5W at 100 cc/h.  Uncontrolled DM with severe hyperglycemia appears he did not have a prior diagnosis of DM 2 but his A1c at 12.8 confirms this diagnosis  -CBG currently controlled, but I anticipate it will increase after we start D5W, so I will increase his Lantus.  DVT prophylaxis: Full dose Lovenox Code Status: DO NOT INTUBATE  Family Communication: Will update family today Disposition Plan: Progressive care  Consultants:  PCCM  Antimicrobials:  Vancomycin 11/5 Cefepime 11/5  Objective: Blood pressure (!) 142/100, pulse 72, temperature 98.7 F (37.1 C), temperature source Axillary, resp. rate 18, height  (1.803 m), weight 98.4 kg, SpO2 90 %.  Intake/Output Summary (Last 24 hours) at 09/20/2019 1156 Last data filed at 09/20/2019 0600 Gross per 24 hour  Intake 4542.94 ml  Output 425 ml  Net 4117.94 ml   Filed Weights   09/17/19 0822 09/17/19 2200  Weight: (S) 81.6  kg 98.4 kg    Examination:  Lethargic, noncommunicative, does not follow commands or answer any questions Symmetrical Chest  wall movement, Good air movement bilaterally, CTAB RRR,No Gallops,Rubs or new Murmurs, No Parasternal Heave +ve B.Sounds, Abd Soft, No tenderness, No rebound - guarding or rigidity. No Cyanosis, Clubbing or edema, No new Rash or bruise    CBC: Recent Labs  Lab 09/18/19 0455 09/19/19 0509 09/20/19 0409  WBC 6.6 8.4 7.2  NEUTROABS 3.8 5.7 4.9  HGB 19.8* 19.6* 17.9*  HCT 65.4* 64.4* 58.4*  MCV 91.7 92.9 91.8  PLT 115* 127* 134*   Basic Metabolic Panel: Recent Labs  Lab 09/18/19 0455  09/19/19 0509 09/19/19 1750 09/20/19 0409  NA 158*   < > 160* 155* 156*  K 4.2   < > 4.4 4.0 4.3  CL 124*   < > 123* 119* 120*  CO2 25   < > 26 25 25   GLUCOSE 129*   < > 174* 318* 199*  BUN 29*   < > 33* 33* 33*  CREATININE 1.49*   < > 1.67* 1.65* 1.33*  CALCIUM 8.5*   < > 8.4* 8.1* 8.5*  MG 2.5*  --  2.2  --  2.3  PHOS 1.3*  --  2.2*  --  2.6   < > = values in this interval not displayed.   GFR: Estimated Creatinine Clearance: 73.2 mL/min (A) (by C-G formula based on SCr of 1.33 mg/dL (H)).  Liver Function Tests: Recent Labs  Lab 09/17/19 0754 09/18/19 0455 09/19/19 0509 09/20/19 0409  AST 41 37 36 35  ALT 27 21 22 22   ALKPHOS 67 52 53 51  BILITOT 1.7* 1.3* 1.0 1.0  PROT 8.8* 7.7 7.2 6.9  ALBUMIN 3.5 3.0* 2.8* 2.7*    Coagulation Profile: Recent Labs  Lab 09/17/19 0754  INR 1.5*    HbA1C: Hgb A1c MFr Bld  Date/Time Value Ref Range Status  09/17/2019 12:42 PM 12.8 (H) 4.8 - 5.6 % Final    Comment:    (NOTE) Pre diabetes:          5.7%-6.4% Diabetes:              >6.4% Glycemic control for   <7.0% adults with diabetes     CBG: Recent Labs  Lab 09/19/19 1122 09/19/19 2003 09/19/19 2336 09/20/19 0331 09/20/19 0802  GLUCAP 192* 287* 245* 228* 172*    Recent Results (from the past 240 hour(s))  Blood Culture (routine x 2)     Status: None (Preliminary result)   Collection Time: 09/17/19  7:54 AM   Specimen: BLOOD  Result Value Ref Range Status   Specimen  Description   Final    BLOOD RIGHT ANTECUBITAL Performed at [facility], [address] [place], [place] [address]    Special Requests   Final    BOTTLES DRAWN AEROBIC AND ANAEROBIC Blood Culture adequate volume Performed at [facility], [address] [place], [place] [address]    Culture   Final    NO GROWTH 3 DAYS Performed at [facility], [address] [place], [place] [address]    Report Status PENDING  Incomplete  Urine culture     Status: None   Collection Time: 09/17/19  7:54 AM   Specimen: In/Out Cath Urine  Result Value Ref Range Status   Specimen Description   Final    IN/OUT CATH URINE Performed at [facility], [address], [place], [place] [address]    Special Requests   Final    NONE Performed at [facility], [place] [address], [place], [place] [address]    Culture   Final    NO GROWTH Performed at [facility], [place] [address], [place], [place] [address]    Report Status 09/18/2019 FINAL  Final  Blood Culture (routine x 2)     Status: None (Preliminary result)   Collection Time: 09/17/19  7:57 AM   Specimen: BLOOD  Result Value Ref Range Status   Specimen Description   Final    BLOOD LEFT ANTECUBITAL Performed at [place] [address] [place], [place] [address]    Special Requests   Final    BOTTLES DRAWN AEROBIC AND ANAEROBIC Blood Culture adequate volume Performed at [place] [address], [place], [place] [address]    Culture   Final    NO GROWTH 3 DAYS Performed at [facility], [place] [address], [place], [place] [address]    Report Status PENDING  Incomplete  SARS CORONAVIRUS 2 (TAT 6-24 HRS) Nasopharyngeal Nasopharyngeal Swab     Status: Abnormal   Collection Time: 09/17/19 10:22 AM   Specimen: Nasopharyngeal Swab  Result Value Ref Range Status   SARS Coronavirus 2 POSITIVE (A) NEGATIVE Final     Comment: RESULT CALLED TO, READ BACK BY AND VERIFIED WITH: S.WEST,RN [id number] I.MANNING (NOTE) SARS-CoV-2 target nucleic acids are DETECTED. The SARS-CoV-2 RNA is generally detectable in upper and lower respiratory specimens during the acute phase of infection. Positive results are indicative of active infection with SARS-CoV-2. Clinical  correlation with patient history and other diagnostic information is necessary to determine patient infection status. Positive results do  not rule out bacterial infection or co-infection with other viruses. The expected result is Negative. Fact Sheet for Patients: SugarRoll.be Fact Sheet for Healthcare Providers: https://www.woods-mathews.com/ This test is not yet approved or cleared by the [place] FDA and  has been authorized for detection and/or diagnosis of SARS-CoV-2 by FDA under an Emergency Use Authorization (EUA). This EUA will remain  in effect (meaning this test can be used) for the  duration of the COVID-19 declaration under Section 564(b)(1) of the Act, 21 U.S.C. section 360bbb-3(b)(1), unless the authorization is terminated or revoked sooner. Performed at [facility], [place] [address] [place], [place] [address]   MRSA PCR Screening     Status: None   Collection Time: 09/17/19  8:15 PM   Specimen: Nasal Mucosa; Nasopharyngeal  Result Value Ref Range Status   MRSA by PCR NEGATIVE NEGATIVE Final    Comment:        The GeneXpert MRSA Assay (FDA approved for NASAL specimens only), is one component of a comprehensive MRSA colonization surveillance program. It is not intended to diagnose MRSA infection nor to guide or monitor treatment for MRSA infections. Performed at [facility], [place] [address], [place], [place] [address]      Scheduled Meds: . Chlorhexidine Gluconate Cloth  6 each Topical Daily  . dexamethasone (DECADRON) injection  6 mg Intravenous  Q24H  . enoxaparin (LOVENOX) injection  1 mg/kg Subcutaneous Q12H  . insulin aspart  0-15 Units Subcutaneous Q4H  . insulin glargine  18 Units Subcutaneous Daily  . mouth rinse  15 mL Mouth Rinse BID  . metoprolol tartrate  5 mg Intravenous Q8H  . thiamine injection  100 mg Intravenous Daily  LOS: 3 days   Huey Bienenstock, MD [facility] Office  [phone] Pager - Text Page per Amion  If 7PM-7AM, please contact night-coverage per Amion 09/20/2019, 11:56 AM

## 2019-09-21 DIAGNOSIS — N179 Acute kidney failure, unspecified: Secondary | ICD-10-CM | POA: Diagnosis not present

## 2019-09-21 DIAGNOSIS — G9341 Metabolic encephalopathy: Secondary | ICD-10-CM | POA: Diagnosis not present

## 2019-09-21 DIAGNOSIS — E860 Dehydration: Secondary | ICD-10-CM | POA: Diagnosis not present

## 2019-09-21 LAB — CBC WITH DIFFERENTIAL/PLATELET
Abs Immature Granulocytes: 0.03 10*3/uL (ref 0.00–0.07)
Basophils Absolute: 0 10*3/uL (ref 0.0–0.1)
Basophils Relative: 0 %
Eosinophils Absolute: 0.1 10*3/uL (ref 0.0–0.5)
Eosinophils Relative: 1 %
HCT: 56.4 % — ABNORMAL HIGH (ref 39.0–52.0)
Hemoglobin: 17.7 g/dL — ABNORMAL HIGH (ref 13.0–17.0)
Immature Granulocytes: 1 %
Lymphocytes Relative: 34 %
Lymphs Abs: 2.1 10*3/uL (ref 0.7–4.0)
MCH: 28.5 pg (ref 26.0–34.0)
MCHC: 31.4 g/dL (ref 30.0–36.0)
MCV: 90.7 fL (ref 80.0–100.0)
Monocytes Absolute: 0.7 10*3/uL (ref 0.1–1.0)
Monocytes Relative: 10 %
Neutro Abs: 3.4 10*3/uL (ref 1.7–7.7)
Neutrophils Relative %: 54 %
Platelets: 102 10*3/uL — ABNORMAL LOW (ref 150–400)
RBC: 6.22 MIL/uL — ABNORMAL HIGH (ref 4.22–5.81)
RDW: 17.2 % — ABNORMAL HIGH (ref 11.5–15.5)
WBC: 6.3 10*3/uL (ref 4.0–10.5)
nRBC: 0.5 % — ABNORMAL HIGH (ref 0.0–0.2)

## 2019-09-21 LAB — COMPREHENSIVE METABOLIC PANEL
ALT: 24 U/L (ref 0–44)
AST: 45 U/L — ABNORMAL HIGH (ref 15–41)
Albumin: 2.8 g/dL — ABNORMAL LOW (ref 3.5–5.0)
Alkaline Phosphatase: 52 U/L (ref 38–126)
Anion gap: 8 (ref 5–15)
BUN: 25 mg/dL — ABNORMAL HIGH (ref 6–20)
CO2: 28 mmol/L (ref 22–32)
Calcium: 8.3 mg/dL — ABNORMAL LOW (ref 8.9–10.3)
Chloride: 113 mmol/L — ABNORMAL HIGH (ref 98–111)
Creatinine, Ser: 1.19 mg/dL (ref 0.61–1.24)
GFR calc Af Amer: >60 mL/min (ref >60)
GFR calc non Af Amer: >60 mL/min (ref >60)
Glucose, Bld: 202 mg/dL — ABNORMAL HIGH (ref 70–99)
Potassium: 3 mmol/L — ABNORMAL LOW (ref 3.5–5.1)
Sodium: 149 mmol/L — ABNORMAL HIGH (ref 135–145)
Total Bilirubin: 1.3 mg/dL — ABNORMAL HIGH (ref 0.3–1.2)
Total Protein: 7 g/dL (ref 6.5–8.1)

## 2019-09-21 LAB — PHOSPHORUS: Phosphorus: 2.2 mg/dL — ABNORMAL LOW (ref 2.5–4.6)

## 2019-09-21 LAB — MAGNESIUM: Magnesium: 2.1 mg/dL (ref 1.7–2.4)

## 2019-09-21 LAB — GLUCOSE, CAPILLARY
Glucose-Capillary: 181 mg/dL — ABNORMAL HIGH (ref 70–99)
Glucose-Capillary: 184 mg/dL — ABNORMAL HIGH (ref 70–99)
Glucose-Capillary: 208 mg/dL — ABNORMAL HIGH (ref 70–99)
Glucose-Capillary: 218 mg/dL — ABNORMAL HIGH (ref 70–99)
Glucose-Capillary: 228 mg/dL — ABNORMAL HIGH (ref 70–99)
Glucose-Capillary: 242 mg/dL — ABNORMAL HIGH (ref 70–99)
Glucose-Capillary: 264 mg/dL — ABNORMAL HIGH (ref 70–99)
Glucose-Capillary: 268 mg/dL — ABNORMAL HIGH (ref 70–99)

## 2019-09-21 LAB — D-DIMER, QUANTITATIVE: D-Dimer, Quant: 0.71 ug/mL-FEU — ABNORMAL HIGH (ref 0.00–0.50)

## 2019-09-21 LAB — PROCALCITONIN: Procalcitonin: <0.1 ng/mL

## 2019-09-21 LAB — FERRITIN: Ferritin: 645 ng/mL — ABNORMAL HIGH (ref 24–336)

## 2019-09-21 LAB — C-REACTIVE PROTEIN: CRP: 1.8 mg/dL — ABNORMAL HIGH (ref <1.0)

## 2019-09-21 MED ORDER — POTASSIUM CHLORIDE 10 MEQ/100ML IV SOLN: 10.0000 meq | INTRAVENOUS | Status: DC

## 2019-09-21 MED ORDER — POTASSIUM CHLORIDE 10 MEQ/100ML IV SOLN
10.0000 meq | INTRAVENOUS | Status: AC
  Administered 2019-09-21 (×4): 10 meq via INTRAVENOUS
  Filled 2019-09-21 (×2): qty 100

## 2019-09-21 MED ORDER — POTASSIUM PHOSPHATES 15 MMOLE/5ML IV SOLN
20.0000 mmol | Freq: Once | INTRAVENOUS | Status: AC
  Administered 2019-09-21: 20 mmol via INTRAVENOUS
  Filled 2019-09-21: qty 6.67

## 2019-09-21 MED ORDER — INSULIN GLARGINE 100 UNIT/ML ~~LOC~~ SOLN
23.0000 [IU] | Freq: Every day | SUBCUTANEOUS | Status: DC
  Administered 2019-09-22 – 2019-09-23 (×2): 23 [IU] via SUBCUTANEOUS
  Filled 2019-09-21 (×3): qty 0.23

## 2019-09-21 NOTE — Progress Notes (Addendum)
Maxwell Miller  VVO:[id number] DOB: 1961-01-04 DOA: 09/17/2019 PCP: Mable Paris, PA-C    Brief Narrative:  58 year old [facility] resident with a history of vascular dementia, nontraumatic intracerebral hemorrhage, portal vein thrombosis on chronic Xarelto, and a diagnosis of COVID-19 10/28 who presented to the ED with 24 hours of severely elevated blood sugars, fever to 105, and altered mental status.  The patient is reportedly conversant at baseline.  Significant Events: 11/5 admit to [facility] ICU via [name] long ED 11/6 transfer to progressive care unit  COVID-19 specific Treatment: Remdesivir 11/5 > Decadron 11/5 >  Subjective: Cussed with staff, no significant events overnight, patient was seen by SLP and started on dysphagia 2 diet  Assessment & Plan:  Covid pneumonia - ARDS - acute hypoxic respiratory failure -Continue with steroids  -Continue with remdesivir  -He is currently on room air  -Continue to follow inflammatory markers closely, trending down which is reassuring . -Follow procalcitonin, but so far I see no indication for antibiotics COVID-19 Labs  Recent Labs    09/19/19 0509 09/20/19 0409 09/21/19 0605  DDIMER 1.42* 0.75* 0.71*  FERRITIN 1,222* 790* 645*  CRP 4.0* 2.6* 1.8*    Lab Results  Component Value Date   SARSCOV2NAA POSITIVE (A) 09/17/2019   SARSCOV2NAA NOT DETECTED 03/30/2019   SARSCOV2NAA NOT DETECTED 03/06/2019     SIRS - lactic acidosis Due to above  Acute toxic metabolic encephalopathy - No acute findings on CT head  -This is most likely metabolic encephalopathy in the setting of electrolyte derangement, mainly hypernatremia and COVID-19 infection.  As well severe hyperglycemia and dehydration with elevated ammonia level. -Patient had elevated ammonia level initially, but this has normalized without intervention - B12, folate both normal -Patient appears to be improving, passed swallow evaluation today and  started on diet  Elevated ammonia -This has normalized  Portal vein thrombosis Chronically on Xarelto -utilizing Lovenox until patient able to take oral medications  Acute kidney injury  Following with volume resuscitation -remains significantly dehydrated -adjusting IV therapies today  Recent Labs  Lab 09/18/19 1316 09/19/19 0509 09/19/19 1750 09/20/19 0409 09/21/19 0605  CREATININE 1.68* 1.67* 1.65* 1.33* 1.19    Polycythemia Likely reflective of severe hemoconcentration/dehydration -follow trend with volume expansion  Recent Labs  Lab 09/17/19 0754 09/18/19 0455 09/19/19 0509 09/20/19 0409 09/21/19 0605  HGB 21.5* 19.8* 19.6* 17.9* 17.7*   Hypernatremia - dehydration -Continue with IV fluids, I will change his fluid to D5W at 100 cc/h.  Uncontrolled DM with severe hyperglycemia appears he did not have a prior diagnosis of DM 2 but his A1c at 12.8 confirms this diagnosis  -CBG currently controlled, but I anticipate it will increase after we start D5W, his Lantus was increased to 23 units subcu daily  Hypokalemia/hypophosphatemia -Repleted, recheck in a.m.   DVT prophylaxis: Full dose Lovenox Code Status: DO NOT INTUBATE  Disposition Plan: Progressive care  Consultants:  PCCM  Antimicrobials:  Vancomycin 11/5 Cefepime 11/5  Objective: Blood pressure (!) 133/120, pulse 90, temperature 99 F (37.2 C), temperature source Oral, resp. rate 20, height 5\' 11"  (1.803 m), weight 98.4 kg, SpO2 95 %.  Intake/Output Summary (Last 24 hours) at 09/21/2019 1513 Last data filed at 09/21/2019 1038 Gross per 24 hour  Intake 1937.54 ml  Output 1900 ml  Net 37.54 ml   Filed Weights   09/17/19 0822 09/17/19 2200  Weight: (S) 81.6 kg 98.4 kg    Examination:  Patient appears to be more awake and  alert today, nodding heads, minimally communicative, does follow simple commands Symmetrical Chest wall movement, Good air movement bilaterally, CTAB RRR,No Gallops,Rubs or  new Murmurs, No Parasternal Heave +ve B.Sounds, Abd Soft, No tenderness, No rebound - guarding or rigidity. No Cyanosis, Clubbing or edema, No new Rash or bruise     CBC: Recent Labs  Lab 09/19/19 0509 09/20/19 0409 09/21/19 0605  WBC 8.4 7.2 6.3  NEUTROABS 5.7 4.9 3.4  HGB 19.6* 17.9* 17.7*  HCT 64.4* 58.4* 56.4*  MCV 92.9 91.8 90.7  PLT 127* 134* 948*   Basic Metabolic Panel: Recent Labs  Lab 09/19/19 0509 09/19/19 1750 09/20/19 0409 09/21/19 0605  NA 160* 155* 156* 149*  K 4.4 4.0 4.3 3.0*  CL 123* 119* 120* 113*  CO2 26 25 25 28   GLUCOSE 174* 318* 199* 202*  BUN 33* 33* 33* 25*  CREATININE 1.67* 1.65* 1.33* 1.19  CALCIUM 8.4* 8.1* 8.5* 8.3*  MG 2.2  --  2.3 2.1  PHOS 2.2*  --  2.6 2.2*   GFR: Estimated Creatinine Clearance: 81.9 mL/min (by C-G formula based on SCr of 1.19 mg/dL).  Liver Function Tests: Recent Labs  Lab 09/18/19 0455 09/19/19 0509 09/20/19 0409 09/21/19 0605  AST 37 36 35 45*  ALT 21 22 22 24   ALKPHOS 52 53 51 52  BILITOT 1.3* 1.0 1.0 1.3*  PROT 7.7 7.2 6.9 7.0  ALBUMIN 3.0* 2.8* 2.7* 2.8*    Coagulation Profile: Recent Labs  Lab 09/17/19 0754  INR 1.5*    HbA1C: Hgb A1c MFr Bld  Date/Time Value Ref Range Status  09/17/2019 12:42 PM 12.8 (H) 4.8 - 5.6 % Final    Comment:    (NOTE) Pre diabetes:          5.7%-6.4% Diabetes:              >6.4% Glycemic control for   <7.0% adults with diabetes     CBG: Recent Labs  Lab 09/20/19 2018 09/21/19 0007 09/21/19 0337 09/21/19 0918 09/21/19 1134  GLUCAP 236* 218* 208* 228* 184*    Recent Results (from the past 240 hour(s))  Blood Culture (routine x 2)     Status: None (Preliminary result)   Collection Time: 09/17/19  7:54 AM   Specimen: BLOOD  Result Value Ref Range Status   Specimen Description   Final    BLOOD RIGHT ANTECUBITAL Performed at [facility], [place] [address], [place], [place] [address]    Special Requests   Final    BOTTLES DRAWN  AEROBIC AND ANAEROBIC Blood Culture adequate volume Performed at [place] [address] [place], [place] [address]    Culture   Final    NO GROWTH 4 DAYS Performed at [facility], [place] [address], [place], [place] [address]    Report Status PENDING  Incomplete  Urine culture     Status: None   Collection Time: 09/17/19  7:54 AM   Specimen: In/Out Cath Urine  Result Value Ref Range Status   Specimen Description   Final    IN/OUT CATH URINE Performed at [place] [address], [place], [place] [address]    Special Requests   Final    NONE Performed at [facility], [place] [address], [place], [place] [address]    Culture   Final    NO GROWTH Performed at [facility], [place] [address], [place], [place] [address]    Report  Status 09/18/2019 FINAL  Final  Blood Culture (routine x 2)     Status: None (Preliminary result)   Collection Time: 09/17/19  7:57 AM   Specimen: BLOOD  Result Value Ref Range Status   Specimen Description   Final    BLOOD LEFT ANTECUBITAL Performed at [facility], [address] [place], [place] [address]    Special Requests   Final    BOTTLES DRAWN AEROBIC AND ANAEROBIC Blood Culture adequate volume Performed at [facility], [address] [place], [place] [address]    Culture   Final    NO GROWTH 4 DAYS Performed at [facility], [address] [place], [place] [address]    Report Status PENDING  Incomplete  SARS CORONAVIRUS 2 (TAT 6-24 HRS) Nasopharyngeal Nasopharyngeal Swab     Status: Abnormal   Collection Time: 09/17/19 10:22 AM   Specimen: Nasopharyngeal Swab  Result Value Ref Range Status   SARS Coronavirus 2 POSITIVE (A) NEGATIVE Final    Comment: RESULT CALLED TO, READ BACK BY AND VERIFIED WITH: S.WEST,RN [id number] I.MANNING (NOTE) SARS-CoV-2 target nucleic acids are DETECTED. The SARS-CoV-2 RNA is generally  detectable in upper and lower respiratory specimens during the acute phase of infection. Positive results are indicative of active infection with SARS-CoV-2. Clinical  correlation with patient history and other diagnostic information is necessary to determine patient infection status. Positive results do  not rule out bacterial infection or co-infection with other viruses. The expected result is Negative. Fact Sheet for Patients: HairSlick.nohttps://www.fda.gov/media/138098/download Fact Sheet for Healthcare Providers: quierodirigir.comhttps://www.fda.gov/media/138095/download This test is not yet approved or cleared by the [place] FDA and  has been authorized for detection and/or diagnosis of SARS-CoV-2 by FDA under an Emergency Use Authorization (EUA). This EUA will remain  in effect (meaning this test can be used) for the  duration of the COVID-19 declaration under Section 564(b)(1) of the Act, 21 U.S.C. section 360bbb-3(b)(1), unless the authorization is terminated or revoked sooner. Performed at [facility], [address] [place], [place] [address]   Respiratory Panel by PCR     Status: None   Collection Time: 09/17/19 11:30 AM  Result Value Ref Range Status   Adenovirus NOT DETECTED NOT DETECTED Final   Coronavirus 229E NOT DETECTED NOT DETECTED Final    Comment: (NOTE) The Coronavirus on the Respiratory Panel, DOES NOT test for the novel  Coronavirus (2019 nCoV)    Coronavirus HKU1 NOT DETECTED NOT DETECTED Final   Coronavirus NL63 NOT DETECTED NOT DETECTED Final   Coronavirus OC43 NOT DETECTED NOT DETECTED Final   Metapneumovirus NOT DETECTED NOT DETECTED Final   Rhinovirus / Enterovirus NOT DETECTED NOT DETECTED Final   Influenza A NOT DETECTED NOT DETECTED Final   Influenza B NOT DETECTED NOT DETECTED Final   Parainfluenza Virus 1 NOT DETECTED NOT DETECTED Final   Parainfluenza Virus 2 NOT DETECTED NOT DETECTED Final   Parainfluenza Virus 3 NOT DETECTED NOT DETECTED Final    Parainfluenza Virus 4 NOT DETECTED NOT DETECTED Final   Respiratory Syncytial Virus NOT DETECTED NOT DETECTED Final   Bordetella pertussis NOT DETECTED NOT DETECTED Final   Chlamydophila pneumoniae NOT DETECTED NOT DETECTED Final   Mycoplasma pneumoniae NOT DETECTED NOT DETECTED Final    Comment: Performed at [facility], [address] [place], [place] [address]  MRSA PCR Screening     Status: None   Collection Time: 09/17/19  8:15 PM   Specimen:  Nasal Mucosa; Nasopharyngeal  Result Value Ref Range Status   MRSA by PCR NEGATIVE NEGATIVE Final    Comment:        The GeneXpert MRSA Assay (FDA approved for NASAL specimens only), is one component of a comprehensive MRSA colonization surveillance program. It is not intended to diagnose MRSA infection nor to guide or monitor treatment for MRSA infections. Performed at [facility], [address] [place], [place] [address]      Scheduled Meds: . Chlorhexidine Gluconate Cloth  6 each Topical Daily  . dexamethasone (DECADRON) injection  6 mg Intravenous Q24H  . enoxaparin (LOVENOX) injection  1 mg/kg Subcutaneous Q12H  . insulin aspart  0-15 Units Subcutaneous Q4H  . insulin glargine  18 Units Subcutaneous Daily  . mouth rinse  15 mL Mouth Rinse BID  . metoprolol tartrate  5 mg Intravenous Q8H  . thiamine injection  100 mg Intravenous Daily    LOS: 4 days   Huey Bienenstock, MD [facility] Office  [phone] Pager - Text Page per Loretha Stapler  If 7PM-7AM, please contact night-coverage per Amion 09/21/2019, 3:13 PM

## 2019-09-21 NOTE — Progress Notes (Signed)
Patient has been opening eyes more often, has pulled of 4 condom catheters even with bilateral mitts on, has been frequently repositioning himself from side to side but does not help much when staff is turning him,  has maintained in the mid to high 90's on RA, no resp distress, Will grunt/moan to say yes/no at times, also was incontinent of a very large BM last night. Condom cath not reapplied at this time due to irritation on penis.

## 2019-09-21 NOTE — Progress Notes (Signed)
Spoke with Myrna, questions answered and updated on the patient's status.  Would like a call from [place] and the MD.

## 2019-09-21 NOTE — Progress Notes (Signed)
Called Myrna, no answer. Asencion Partridge updated

## 2019-09-21 NOTE — Progress Notes (Signed)
Inpatient Diabetes Program Recommendations  AACE/ADA: New Consensus Statement on Inpatient Glycemic Control   Target Ranges:  Prepandial:   less than 140 mg/dL      Peak postprandial:   less than 180 mg/dL (1-2 hours)      Critically ill patients:  140 - 180 mg/dL   Results for JHAIR, WITHERINGTON (MRN [id number]) as of 09/21/2019 12:10  Ref. Range 09/20/2019 08:02 09/20/2019 11:54 09/20/2019 15:16 09/20/2019 20:18 09/21/2019 00:07 09/21/2019 03:37 09/21/2019 09:18 09/21/2019 11:34  Glucose-Capillary Latest Ref Range: 70 - 99 mg/dL 172 (H) 178 (H) 240 (H) 236 (H) 218 (H) 208 (H) 228 (H) 184 (H)   Review of Glycemic Control  Current orders for Inpatient glycemic control: Lantus 18 units daily, Novolog 0-15 units Q4H; Decadron 6 mg Q24H, D5@ 100 ml/hr  Inpatient Diabetes Program Recommendations:   Insulin-Basal: If steroids are continued, please consider increasing Lantus to 23 units daily.  Thanks, Barnie Alderman, RN, MSN, CDE Diabetes Coordinator Inpatient Diabetes Program [phone] (Team Pager from 8am to 5pm)

## 2019-09-21 NOTE — Evaluation (Signed)
Clinical/Bedside Swallow Evaluation Patient Details  Name: Maxwell Miller MRN: [id number] Date of Birth: 1961-11-08  Today's Date: 09/21/2019 Time: SLP Start Time (ACUTE ONLY): 6967 SLP Stop Time (ACUTE ONLY): 1421 SLP Time Calculation (min) (ACUTE ONLY): 14 min  Past Medical History:  Past Medical History:  Diagnosis Date  . Anal or rectal pain   . Anxiety disorder   . Clinical diagnosis of COVID-19   . Hyperplasia of prostate without lower urinary tract symptoms (LUTS)   . Hypertension   . Major depressive disorder, recurrent episode, moderate ([place])   . Mixed hyperlipidemia   . Nontraumatic intracerebral hemorrhage ([place])   . Other malaise   . Portal vein thrombosis   . Stroke ([place])   . Vascular dementia without behavioral disturbance [facility])    Past Surgical History: History reviewed. No pertinent surgical history. HPI:  Pt is a 58 yo male admitted from SNF with fever and AMS, positive for COVID-19. PMH: dementia, ICH   Assessment / Plan / Recommendation Clinical Impression  Pt has slow processing and a flat affect. He does not consistently follow commands, but this seems to be improving compared to the last few days when he was not sufficiently alert to attempt POs. Today he has slow mastication and needs liquid washes for oral clearance, but he shows no overt s/s of aspiration. Recommend starting Dys 2 diet (finely chopped) and thin liquids. Will f/u for tolerance and potential to advance.   SLP Visit Diagnosis: Dysphagia, unspecified (R13.10)    Aspiration Risk  Mild aspiration risk    Diet Recommendation Dysphagia 2 (Fine chop);Thin liquid   Liquid Administration via: Cup;Straw Medication Administration: Whole meds with puree Supervision: Patient able to self feed;Full supervision/cueing for compensatory strategies Compensations: Slow rate;Small sips/bites;Minimize environmental distractions Postural Changes: Seated upright at 90 degrees    Other  Recommendations  Oral Care Recommendations: Oral care BID   Follow up Recommendations Skilled Nursing facility      Frequency and Duration min 2x/week  2 weeks       Prognosis Prognosis for Safe Diet Advancement: Good Barriers to Reach Goals: Cognitive deficits      Swallow Study   General HPI: Pt is a 58 yo male admitted from SNF with fever and AMS, positive for COVID-19. PMH: dementia, ICH Type of Study: Bedside Swallow Evaluation Previous Swallow Assessment: none in chart Diet Prior to this Study: NPO Temperature Spikes Noted: No Respiratory Status: Room air History of Recent Intubation: No Behavior/Cognition: Alert;Cooperative;Requires cueing Oral Cavity Assessment: Within Functional Limits Oral Care Completed by SLP: No Oral Cavity - Dentition: Adequate natural dentition Patient Positioning: Upright in bed Baseline Vocal Quality: Low vocal intensity Volitional Swallow: Able to elicit    Oral/Motor/Sensory Function Overall Oral Motor/Sensory Function: Within functional limits(not following all commands, but from what could be seen)   Ice Chips Ice chips: Not tested   Thin Liquid Thin Liquid: Within functional limits Presentation: Cup;Straw    Nectar Thick Nectar Thick Liquid: Not tested   Honey Thick Honey Thick Liquid: Not tested   Puree Puree: Impaired Presentation: Spoon Oral Phase Functional Implications: Prolonged oral transit   Solid     Solid: Impaired Oral Phase Functional Implications: Prolonged oral transit;Oral residue      Venita Sheffield Jamont Mellin 09/21/2019,4:07 PM   Pollyann Glen, M.A. [place] Acute Environmental education officer [phone] Office [phone]

## 2019-09-22 DIAGNOSIS — R739 Hyperglycemia, unspecified: Secondary | ICD-10-CM | POA: Diagnosis not present

## 2019-09-22 DIAGNOSIS — E860 Dehydration: Secondary | ICD-10-CM | POA: Diagnosis not present

## 2019-09-22 DIAGNOSIS — N179 Acute kidney failure, unspecified: Secondary | ICD-10-CM | POA: Diagnosis not present

## 2019-09-22 DIAGNOSIS — G9341 Metabolic encephalopathy: Secondary | ICD-10-CM | POA: Diagnosis not present

## 2019-09-22 LAB — CBC WITH DIFFERENTIAL/PLATELET
Abs Immature Granulocytes: 0.05 10*3/uL (ref 0.00–0.07)
Basophils Absolute: 0 10*3/uL (ref 0.0–0.1)
Basophils Relative: 1 %
Eosinophils Absolute: 0.1 10*3/uL (ref 0.0–0.5)
Eosinophils Relative: 1 %
HCT: 58 % — ABNORMAL HIGH (ref 39.0–52.0)
Hemoglobin: 19 g/dL — ABNORMAL HIGH (ref 13.0–17.0)
Immature Granulocytes: 1 %
Lymphocytes Relative: 43 %
Lymphs Abs: 2.5 10*3/uL (ref 0.7–4.0)
MCH: 28.7 pg (ref 26.0–34.0)
MCHC: 32.8 g/dL (ref 30.0–36.0)
MCV: 87.6 fL (ref 80.0–100.0)
Monocytes Absolute: 0.6 10*3/uL (ref 0.1–1.0)
Monocytes Relative: 10 %
Neutro Abs: 2.6 10*3/uL (ref 1.7–7.7)
Neutrophils Relative %: 44 %
Platelets: 119 10*3/uL — ABNORMAL LOW (ref 150–400)
RBC: 6.62 MIL/uL — ABNORMAL HIGH (ref 4.22–5.81)
RDW: 16.4 % — ABNORMAL HIGH (ref 11.5–15.5)
WBC: 5.8 10*3/uL (ref 4.0–10.5)
nRBC: 0 % (ref 0.0–0.2)

## 2019-09-22 LAB — COMPREHENSIVE METABOLIC PANEL
ALT: 35 U/L (ref 0–44)
AST: 54 U/L — ABNORMAL HIGH (ref 15–41)
Albumin: 3 g/dL — ABNORMAL LOW (ref 3.5–5.0)
Alkaline Phosphatase: 56 U/L (ref 38–126)
Anion gap: 10 (ref 5–15)
BUN: 20 mg/dL (ref 6–20)
CO2: 24 mmol/L (ref 22–32)
Calcium: 8.2 mg/dL — ABNORMAL LOW (ref 8.9–10.3)
Chloride: 105 mmol/L (ref 98–111)
Creatinine, Ser: 0.98 mg/dL (ref 0.61–1.24)
GFR calc Af Amer: >60 mL/min (ref >60)
GFR calc non Af Amer: >60 mL/min (ref >60)
Glucose, Bld: 149 mg/dL — ABNORMAL HIGH (ref 70–99)
Potassium: 3 mmol/L — ABNORMAL LOW (ref 3.5–5.1)
Sodium: 139 mmol/L (ref 135–145)
Total Bilirubin: 1.4 mg/dL — ABNORMAL HIGH (ref 0.3–1.2)
Total Protein: 7.3 g/dL (ref 6.5–8.1)

## 2019-09-22 LAB — D-DIMER, QUANTITATIVE: D-Dimer, Quant: 0.54 ug/mL-FEU — ABNORMAL HIGH (ref 0.00–0.50)

## 2019-09-22 LAB — PHOSPHORUS: Phosphorus: 2.6 mg/dL (ref 2.5–4.6)

## 2019-09-22 LAB — CULTURE, BLOOD (ROUTINE X 2)
Culture: NO GROWTH
Culture: NO GROWTH
Special Requests: ADEQUATE
Special Requests: ADEQUATE

## 2019-09-22 LAB — MAGNESIUM: Magnesium: 2 mg/dL (ref 1.7–2.4)

## 2019-09-22 LAB — GLUCOSE, CAPILLARY
Glucose-Capillary: 170 mg/dL — ABNORMAL HIGH (ref 70–99)
Glucose-Capillary: 194 mg/dL — ABNORMAL HIGH (ref 70–99)
Glucose-Capillary: 214 mg/dL — ABNORMAL HIGH (ref 70–99)
Glucose-Capillary: 244 mg/dL — ABNORMAL HIGH (ref 70–99)
Glucose-Capillary: 248 mg/dL — ABNORMAL HIGH (ref 70–99)
Glucose-Capillary: 278 mg/dL — ABNORMAL HIGH (ref 70–99)
Glucose-Capillary: 319 mg/dL — ABNORMAL HIGH (ref 70–99)

## 2019-09-22 LAB — FERRITIN: Ferritin: 741 ng/mL — ABNORMAL HIGH (ref 24–336)

## 2019-09-22 LAB — PROCALCITONIN: Procalcitonin: <0.1 ng/mL

## 2019-09-22 LAB — C-REACTIVE PROTEIN: CRP: 2 mg/dL — ABNORMAL HIGH (ref <1.0)

## 2019-09-22 MED ORDER — DEXTROSE 5 % IV SOLN: INTRAVENOUS | Status: DC

## 2019-09-22 MED ORDER — POTASSIUM CHLORIDE CRYS ER 20 MEQ PO TBCR
40.0000 meq | EXTENDED_RELEASE_TABLET | Freq: Once | ORAL | Status: AC
  Administered 2019-09-22: 40 meq via ORAL
  Filled 2019-09-22: qty 2

## 2019-09-22 NOTE — Plan of Care (Signed)
The patient has diminished cognitive ability which negatively impacts his capacity to understand and retain new information.

## 2019-09-22 NOTE — Progress Notes (Addendum)
Maxwell Miller  ZOX:[id number] DOB: Dec 20, 1960 DOA: 09/17/2019 PCP: Mable Paris, PA-C    Brief Narrative:   57 year old [place] SNF resident with a history of vascular dementia, nontraumatic intracerebral hemorrhage, portal vein thrombosis on chronic Xarelto, and a diagnosis of COVID-19 10/28 who presented to the ED with 24 hours of severely elevated blood sugars, fever to 105, and altered mental status.  The patient is reportedly conversant at baseline.  Significant Events: 11/5 admit to [facility] ICU via [facility] long ED 11/6 transfer to progressive care unit  COVID-19 specific Treatment: Remdesivir 11/5 > Decadron 11/5 >  Subjective:  No significant events overnight as discussed with staff, patient himself denies any complaints today, more conversant and appropriate, passed swallow evaluation and started on dysphagia 2 diet yesterday.  Assessment & Plan:  COVID-19 infection/pneumonia -With significant fever 105.4 on presentation, encephalopathic  -Continue with steroids  -Continue with remdesivir  -He is currently on room air  -Continue to follow inflammatory markers closely, trending down which is reassuring . -Follow procalcitonin, but so far I see no indication for antibiotics COVID-19 Labs  Recent Labs    09/20/19 0409 09/21/19 0605 09/22/19 1050  DDIMER 0.75* 0.71* 0.54*  FERRITIN 790* 645* 741*  CRP 2.6* 1.8* 2.0*    Lab Results  Component Value Date   SARSCOV2NAA POSITIVE (A) 09/17/2019   SARSCOV2NAA NOT DETECTED 03/30/2019   SARSCOV2NAA NOT DETECTED 03/06/2019     SIRS - lactic acidosis Due to above  Acute toxic metabolic encephalopathy - No acute findings on CT head  -This is most likely metabolic encephalopathy in the setting of electrolyte derangement, mainly hypernatremia and COVID-19 infection.  As well severe hyperglycemia and dehydration with elevated ammonia level. -Patient had elevated ammonia level initially, but this has  normalized without intervention - B12, folate both normal -Patient significantly improved, more conversant and appropriate today getting close to baseline .t  Elevated ammonia -This has normalized without any intervention  Portal vein thrombosis -Chronically on Xarelto, will keep on Lovenox during hospital stay, can be resumed back on Xarelto once stable .  Acute kidney injury  -Volume depletion and dehydration, resolved with IV fluids, back to baseline  Recent Labs  Lab 09/19/19 0509 09/19/19 1750 09/20/19 0409 09/21/19 0605 09/22/19 1050  CREATININE 1.67* 1.65* 1.33* 1.19 0.98    Polycythemia -This appears to be chronic, but likely worsened due to hemoconcentration and dehydration .  Recent Labs  Lab 09/18/19 0455 09/19/19 0509 09/20/19 0409 09/21/19 0605 09/22/19 1050  HGB 19.8* 19.6* 17.9* 17.7* 19.0*   Hypernatremia -Secondary to dehydration, resolved with D5W, now he is eating and drinking will DC IV fluids.  Uncontrolled DM with severe hyperglycemia - appears he did not have a prior diagnosis of DM 2 but his A1c at 12.8 confirms this diagnosis  -BG better controlled on sliding scale and Lantus 23 units, especially after we stopped his D5W .  Hypokalemia/hypophosphatemia -Repleted   DVT prophylaxis: Full dose Lovenox Code Status: DO NOT INTUBATE  Family communication: D/W niece today Disposition Plan: Will need SNF placement, niece wants different SNF facility, will consult social worker  Consultants:  PCCM  Antimicrobials:  Vancomycin 11/5 Cefepime 11/5  Objective: Blood pressure (!) 140/91, pulse 89, temperature 97.6 F (36.4 C), temperature source Oral, resp. rate 19, height  (1.803 m), weight 98.4 kg, SpO2 98 %.  Intake/Output Summary (Last 24 hours) at 09/22/2019 1418 Last data filed at 09/22/2019 1300 Gross per 24 hour  Intake 2198.22 ml  Output 925 ml  Net 1273.22 ml   Filed Weights   09/17/19 0822 09/17/19 2200  Weight: (S)  81.6 kg 98.4 kg    Examination:  Patient appears to be more awake and alert today, nodding heads, minimally communicative, does follow simple commands Symmetrical Chest wall movement, Good air movement bilaterally, CTAB RRR,No Gallops,Rubs or new Murmurs, No Parasternal Heave +ve B.Sounds, Abd Soft, No tenderness, No rebound - guarding or rigidity. No Cyanosis, Clubbing or edema, No new Rash or bruise     CBC: Recent Labs  Lab 09/20/19 0409 09/21/19 0605 09/22/19 1050  WBC 7.2 6.3 5.8  NEUTROABS 4.9 3.4 2.6  HGB 17.9* 17.7* 19.0*  HCT 58.4* 56.4* 58.0*  MCV 91.8 90.7 87.6  PLT 134* 102* 119*   Basic Metabolic Panel: Recent Labs  Lab 09/20/19 0409 09/21/19 0605 09/22/19 1050  NA 156* 149* 139  K 4.3 3.0* 3.0*  CL 120* 113* 105  CO2 25 28 24   GLUCOSE 199* 202* 149*  BUN 33* 25* 20  CREATININE 1.33* 1.19 0.98  CALCIUM 8.5* 8.3* 8.2*  MG 2.3 2.1 2.0  PHOS 2.6 2.2* 2.6   GFR: Estimated Creatinine Clearance: 99.4 mL/min (by C-G formula based on SCr of 0.98 mg/dL).  Liver Function Tests: Recent Labs  Lab 09/19/19 0509 09/20/19 0409 09/21/19 0605 09/22/19 1050  AST 36 35 45* 54*  ALT 22 22 24  35  ALKPHOS 53 51 52 56  BILITOT 1.0 1.0 1.3* 1.4*  PROT 7.2 6.9 7.0 7.3  ALBUMIN 2.8* 2.7* 2.8* 3.0*    Coagulation Profile: Recent Labs  Lab 09/17/19 0754  INR 1.5*    HbA1C: Hgb A1c MFr Bld  Date/Time Value Ref Range Status  09/17/2019 12:42 PM 12.8 (H) 4.8 - 5.6 % Final    Comment:    (NOTE) Pre diabetes:          5.7%-6.4% Diabetes:              >6.4% Glycemic control for   <7.0% adults with diabetes     CBG: Recent Labs  Lab 09/21/19 2110 09/22/19 0105 09/22/19 0430 09/22/19 0801 09/22/19 1153  GLUCAP 268* 244* 214* 194* 170*    Recent Results (from the past 240 hour(s))  Blood Culture (routine x 2)     Status: None   Collection Time: 09/17/19  7:54 AM   Specimen: BLOOD  Result Value Ref Range Status   Specimen Description   Final     BLOOD RIGHT ANTECUBITAL Performed at [facility], [address] [place], [place]    Special Requests   Final    BOTTLES DRAWN AEROBIC AND ANAEROBIC Blood Culture adequate volume Performed at [facility], [address] [place], [place]    Culture   Final    NO GROWTH 5 DAYS Performed at [facility], [address], [place], [address] [place]    Report Status 09/22/2019 FINAL  Final  Urine culture     Status: None   Collection Time: 09/17/19  7:54 AM   Specimen: In/Out Cath Urine  Result Value Ref Range Status   Specimen Description   Final    IN/OUT CATH URINE Performed at [facility], [address], [place], [place]    Special Requests   Final    NONE Performed at [facility], [address] [place], [place]    Culture   Final  NO GROWTH Performed at [facility], [address] [place], [place] [address]    Report Status 09/18/2019 FINAL  Final  Blood Culture (routine x 2)     Status: None   Collection Time: 09/17/19  7:57 AM   Specimen: BLOOD  Result Value Ref Range Status   Specimen Description   Final    BLOOD LEFT ANTECUBITAL Performed at [facility], [address] [place], [place] [address]    Special Requests   Final    BOTTLES DRAWN AEROBIC AND ANAEROBIC Blood Culture adequate volume Performed at [facility], [address] [place], [place] [address]    Culture   Final    NO GROWTH 5 DAYS Performed at [facility], [address] [place], [place] [address]    Report Status 09/22/2019 FINAL  Final  SARS CORONAVIRUS 2 (TAT 6-24 HRS) Nasopharyngeal Nasopharyngeal Swab     Status: Abnormal   Collection Time: 09/17/19 10:22 AM   Specimen: Nasopharyngeal Swab  Result Value Ref Range Status   SARS Coronavirus 2 POSITIVE (A) NEGATIVE Final    Comment: RESULT CALLED TO, READ  BACK BY AND VERIFIED WITH: S.WEST,RN [id number] I.MANNING (NOTE) SARS-CoV-2 target nucleic acids are DETECTED. The SARS-CoV-2 RNA is generally detectable in upper and lower respiratory specimens during the acute phase of infection. Positive results are indicative of active infection with SARS-CoV-2. Clinical  correlation with patient history and other diagnostic information is necessary to determine patient infection status. Positive results do  not rule out bacterial infection or co-infection with other viruses. The expected result is Negative. Fact Sheet for Patients: HairSlick.nohttps://www.fda.gov/media/138098/download Fact Sheet for Healthcare Providers: quierodirigir.comhttps://www.fda.gov/media/138095/download This test is not yet approved or cleared by the [place] FDA and  has been authorized for detection and/or diagnosis of SARS-CoV-2 by FDA under an Emergency Use Authorization (EUA). This EUA will remain  in effect (meaning this test can be used) for the  duration of the COVID-19 declaration under Section 564(b)(1) of the Act, 21 U.S.C. section 360bbb-3(b)(1), unless the authorization is terminated or revoked sooner. Performed at [facility], [address] [place], [place] [address]   Respiratory Panel by PCR     Status: None   Collection Time: 09/17/19 11:30 AM  Result Value Ref Range Status   Adenovirus NOT DETECTED NOT DETECTED Final   Coronavirus 229E NOT DETECTED NOT DETECTED Final    Comment: (NOTE) The Coronavirus on the Respiratory Panel, DOES NOT test for the novel  Coronavirus (2019 nCoV)    Coronavirus HKU1 NOT DETECTED NOT DETECTED Final   Coronavirus NL63 NOT DETECTED NOT DETECTED Final   Coronavirus OC43 NOT DETECTED NOT DETECTED Final   Metapneumovirus NOT DETECTED NOT DETECTED Final   Rhinovirus / Enterovirus NOT DETECTED NOT DETECTED Final   Influenza A NOT DETECTED NOT DETECTED Final   Influenza B NOT DETECTED NOT DETECTED Final   Parainfluenza Virus 1 NOT  DETECTED NOT DETECTED Final   Parainfluenza Virus 2 NOT DETECTED NOT DETECTED Final   Parainfluenza Virus 3 NOT DETECTED NOT DETECTED Final   Parainfluenza Virus 4 NOT DETECTED NOT DETECTED Final   Respiratory Syncytial Virus NOT DETECTED NOT DETECTED Final   Bordetella pertussis NOT DETECTED NOT DETECTED Final   Chlamydophila pneumoniae NOT DETECTED NOT DETECTED Final   Mycoplasma pneumoniae NOT DETECTED NOT DETECTED Final    Comment: Performed at [facility], [address] [place], [place] [address]  MRSA PCR  Screening     Status: None   Collection Time: 09/17/19  8:15 PM   Specimen: Nasal Mucosa; Nasopharyngeal  Result Value Ref Range Status   MRSA by PCR NEGATIVE NEGATIVE Final    Comment:        The GeneXpert MRSA Assay (FDA approved for NASAL specimens only), is one component of a comprehensive MRSA colonization surveillance program. It is not intended to diagnose MRSA infection nor to guide or monitor treatment for MRSA infections. Performed at [facility], [place] [address] [place], [place] [address]      Scheduled Meds: . Chlorhexidine Gluconate Cloth  6 each Topical Daily  . dexamethasone (DECADRON) injection  6 mg Intravenous Q24H  . enoxaparin (LOVENOX) injection  1 mg/kg Subcutaneous Q12H  . insulin aspart  0-15 Units Subcutaneous Q4H  . insulin glargine  23 Units Subcutaneous Daily  . mouth rinse  15 mL Mouth Rinse BID  . metoprolol tartrate  5 mg Intravenous Q8H  . thiamine injection  100 mg Intravenous Daily    LOS: 5 days   Phillips Climes, MD [facility] Office  [phone] Pager - Text Page per Shea Evans  If 7PM-7AM, please contact night-coverage per Amion 09/22/2019, 2:18 PM

## 2019-09-22 NOTE — Progress Notes (Signed)
Maxwell Miller updated

## 2019-09-23 DIAGNOSIS — E1165 Type 2 diabetes mellitus with hyperglycemia: Secondary | ICD-10-CM | POA: Diagnosis not present

## 2019-09-23 DIAGNOSIS — U071 COVID-19: Secondary | ICD-10-CM | POA: Diagnosis not present

## 2019-09-23 DIAGNOSIS — G9341 Metabolic encephalopathy: Secondary | ICD-10-CM | POA: Diagnosis not present

## 2019-09-23 DIAGNOSIS — D751 Secondary polycythemia: Secondary | ICD-10-CM | POA: Diagnosis not present

## 2019-09-23 LAB — GLUCOSE, CAPILLARY
Glucose-Capillary: 146 mg/dL — ABNORMAL HIGH (ref 70–99)
Glucose-Capillary: 120 mg/dL — ABNORMAL HIGH (ref 70–99)
Glucose-Capillary: 322 mg/dL — ABNORMAL HIGH (ref 70–99)
Glucose-Capillary: 344 mg/dL — ABNORMAL HIGH (ref 70–99)
Glucose-Capillary: 270 mg/dL — ABNORMAL HIGH (ref 70–99)
Glucose-Capillary: 303 mg/dL — ABNORMAL HIGH (ref 70–99)

## 2019-09-23 MED ORDER — POTASSIUM CHLORIDE CRYS ER 20 MEQ PO TBCR
40.0000 meq | EXTENDED_RELEASE_TABLET | Freq: Once | ORAL | Status: AC
  Administered 2019-09-23: 40 meq via ORAL
  Filled 2019-09-23: qty 2

## 2019-09-23 NOTE — Progress Notes (Signed)
  Speech Language Pathology Treatment: Dysphagia  Patient Details Name: Maxwell Miller MRN: [id number] DOB: Mar 21, 1961 Today's Date: 09/23/2019 Time: 0920-0940 SLP Time Calculation (min) (ACUTE ONLY): 20 min  Assessment / Plan / Recommendation Clinical Impression  Pt seen up in char with breakfast tray in front of him, mostly untouched. Staff has provided set up assist, but pt has very poor initiation to self feed. Only motivated to eat sweets per RN. With min to moderate verbal and visual cues pt drank thin liquids on tray without signs of aspiration. When encouraged, pt could grasp fork to self feed, but only one bite and prolonged mastication and oral holding noted. By the end of session, max verbal cues and liquid wash needed to encourage swallowing. Will downgrade diet to puree given current inability to consume chewable solids in a significant quantity. Oral holding increased risk of aspiration given pts mentation. Will f/u for tolerance and possible advancement if mentation continues to improve.   HPI HPI: Pt is a 58 yo male admitted from SNF with fever and AMS, positive for COVID-19. PMH: dementia, ICH      SLP Plan  Continue with current plan of care       Recommendations  Diet recommendations: Dysphagia 1 (puree);Thin liquid Liquids provided via: Cup;Straw Medication Administration: Whole meds with puree Supervision: Staff to assist with self feeding Compensations: Slow rate;Small sips/bites;Minimize environmental distractions Postural Changes and/or Swallow Maneuvers: Seated upright 90 degrees                Oral Care Recommendations: Oral care BID Follow up Recommendations: Skilled Nursing facility Plan: Continue with current plan of care       GO               Herbie Baltimore, MA [place] Pager [phone] Office [phone]  Lynann Beaver 09/23/2019, 11:50 AM

## 2019-09-23 NOTE — Progress Notes (Signed)
Inpatient Diabetes Program Recommendations  AACE/ADA: New Consensus Statement on Inpatient Glycemic Control   Target Ranges:  Prepandial:   less than 140 mg/dL      Peak postprandial:   less than 180 mg/dL (1-2 hours)      Critically ill patients:  140 - 180 mg/dL   Results for Maxwell Miller, Maxwell Miller (MRN [id number]) as of 09/23/2019 11:22  Ref. Range 09/22/2019 08:01 09/22/2019 11:53 09/22/2019 15:19 09/22/2019 19:58 09/22/2019 23:38 09/23/2019 03:46 09/23/2019 08:52  Glucose-Capillary Latest Ref Range: 70 - 99 mg/dL 194 (H) 170 (H) 278 (H) 319 (H) 248 (H) 146 (H) 120 (H)   Review of Glycemic Control  Current orders for Inpatient glycemic control: Lantus 23 units daily, Novolog 0-15 units Q4H; Decadron 6 mg Q24H  Inpatient Diabetes Program Recommendations:   Insulin-Basal: If steroids are continued, please consider increasing Lantus to 27 units daily.  NOTE: Per chart, patient is not eating well (0-15% of meals), getting Novolog correction Q4H, and received a total of Novolog 32 units over the past 24 hours for correction. Noted D5 was discontinued yesterday afternoon.  Thanks, Barnie Alderman, RN, MSN, CDE Diabetes Coordinator Inpatient Diabetes Program [phone] (Team Pager from 8am to 5pm)

## 2019-09-23 NOTE — Progress Notes (Signed)
Maxwell Miller  HCW:[id number] DOB: 11-28-1960 DOA: 09/17/2019 PCP: Mable Paris, PA-C    Brief Narrative:   58 year old [facility] resident with a history of vascular dementia, nontraumatic intracerebral hemorrhage, portal vein thrombosis on chronic Xarelto, and a diagnosis of COVID-19 10/28 who presented to the ED with 24 hours of severely elevated blood sugars, fever to 105, and altered mental status.  The patient is reportedly conversant at baseline.  Significant Events: 11/5 admit to [facility] ICU via [facility] long ED 11/6 transfer to progressive care unit  COVID-19 specific Treatment: Remdesivir 11/5 > Decadron 11/5 >  Subjective: No significant events per nursing staff.  Apparently patient's mentation has been improving gradually.  Speech therapy was working with the patient when I evaluated him.  Patient was not very communicative.  He does track me around the room.  Assessment & Plan:  Pneumonia due to COVID-19  COVID-19 Labs  Recent Labs    09/21/19 0605 09/22/19 1050  DDIMER 0.71* 0.54*  FERRITIN 645* 741*  CRP 1.8* 2.0*    Lab Results  Component Value Date   SARSCOV2NAA POSITIVE (A) 09/17/2019   SARSCOV2NAA NOT DETECTED 03/30/2019   SARSCOV2NAA NOT DETECTED 03/06/2019    Patient presented with high fever with a temperature of 105.4 F.  He was encephalopathic.  He was placed on steroids Remdesivir.  He appears to have completed course of remdesivir.  He remains on steroids.  Currently he is on room air.  His inflammatory markers have been improving with CRP down to 2.0.  Ferritin 741.  D-dimer 0.54.   SIRS/lactic acidosis Due to the above.  Now resolved.  Acute metabolic encephalopathy No acute findings on CT head.  This was thought to be mainly in the setting of acute COVID-19 infection, electrolyte derangement and hypernatremia.  Patient also was dehydrated.  He did have elevated ammonia level initially but this has normalized without  intervention.  B12 and folate levels were normal.  Mentation has been slowly improving.  Continue to monitor.  Hyperammonemia Normalized without intervention.  History of portal vein thrombosis He is chronically on rivaroxaban.  Currently on Lovenox due to poor oral intake.  Hopefully can be transition back to rivaroxaban tomorrow.  Acute kidney injury This was due to volume depletion and dehydration.  Resolved with IV fluids.  Creatinine was 1.67 at admission.  Improved to 0.98 yesterday.  Polycythemia This appears to be chronic.  Hypernatremia Secondary to dehydration.  Resolved with the D5 water.  Dysphagia Speech therapy is following.  He is on a dysphagia 1 diet.  Continue to monitor.  Diabetes mellitus type 2, uncontrolled with hyperglycemia, new diagnosis This appears to be a new diagnosis for him.  HbA1c 12.8.  Continue to monitor CBGs.  May need to go up on the dose of Lantus.  Continue SSI.  Hypokalemia Was repleted.  Recheck labs tomorrow.  DVT prophylaxis: Full dose Lovenox Code Status: DO NOT INTUBATE  Family communication: We will discuss with his family today Disposition Plan: Will need SNF placement, niece wants different SNF facility, will consult social worker  Consultants:  PCCM, signed off  Antimicrobials:  Anti-infectives (From admission, onward)   Start     Dose/Rate Route Frequency Ordered Stop   09/18/19 1000  remdesivir 100 mg in sodium chloride 0.9 % 250 mL IVPB     100 mg 500 mL/hr over 30 Minutes Intravenous Every 24 hours 09/17/19 1117 09/21/19 1055   09/17/19 1200  remdesivir 200 mg in sodium chloride  0.9 % 250 mL IVPB     200 mg 500 mL/hr over 30 Minutes Intravenous Once 09/17/19 1117 09/17/19 1249   09/17/19 0745  ceFEPIme (MAXIPIME) 2 g in sodium chloride 0.9 % 100 mL IVPB     2 g 200 mL/hr over 30 Minutes Intravenous  Once 09/17/19 0734 09/17/19 0837   09/17/19 0745  vancomycin (VANCOCIN) 1,750 mg in sodium chloride 0.9 % 500 mL IVPB      1,750 mg 250 mL/hr over 120 Minutes Intravenous  Once 09/17/19 0734 09/17/19 1104       Objective: Blood pressure 111/69, pulse 81, temperature 98.1 F (36.7 C), temperature source Oral, resp. rate 16, height  (1.803 m), weight 98.4 kg, SpO2 97 %.  Intake/Output Summary (Last 24 hours) at 09/23/2019 1428 Last data filed at 09/22/2019 2230 Gross per 24 hour  Intake 480 ml  Output -  Net 480 ml   Filed Weights   09/17/19 0822 09/17/19 2200  Weight: (S) 81.6 kg 98.4 kg    Examination:   General appearance: Awake alert.  Distracted.  In no distress. Resp: Clear to auscultation bilaterally.  Normal effort Cardio: S1-S2 is normal regular.  No S3-S4.  No rubs murmurs or bruit GI: Abdomen is soft.  Nontender nondistended.  Bowel sounds are present normal.  No masses organomegaly Extremities: No edema.  Neurologic: Not very communicative.  No obvious facial asymmetry noted.  Not following commands.    CBC: Recent Labs  Lab 09/20/19 0409 09/21/19 0605 09/22/19 1050  WBC 7.2 6.3 5.8  NEUTROABS 4.9 3.4 2.6  HGB 17.9* 17.7* 19.0*  HCT 58.4* 56.4* 58.0*  MCV 91.8 90.7 87.6  PLT 134* 102* 119*   Basic Metabolic Panel: Recent Labs  Lab 09/20/19 0409 09/21/19 0605 09/22/19 1050  NA 156* 149* 139  K 4.3 3.0* 3.0*  CL 120* 113* 105  CO2 GLUCOSE 199* 202* 149*  BUN 33* 25* 20  CREATININE 1.33* 1.19 0.98  CALCIUM 8.5* 8.3* 8.2*  MG 2.3 2.1 2.0  PHOS 2.6 2.2* 2.6   GFR: Estimated Creatinine Clearance: 99.4 mL/min (by C-G formula based on SCr of 0.98 mg/dL).  Liver Function Tests: Recent Labs  Lab 09/19/19 0509 09/20/19 0409 09/21/19 0605 09/22/19 1050  AST 36 35 45* 54*  ALT 35  ALKPHOS 53 51 52 56  BILITOT 1.0 1.0 1.3* 1.4*  PROT 7.2 6.9 7.0 7.3  ALBUMIN 2.8* 2.7* 2.8* 3.0*    Coagulation Profile: Recent Labs  Lab 09/17/19 0754  INR 1.5*    HbA1C: Hgb A1c MFr Bld  Date/Time Value Ref Range Status  09/17/2019 12:42 PM  12.8 (H) 4.8 - 5.6 % Final    Comment:    (NOTE) Pre diabetes:          5.7%-6.4% Diabetes:              >6.4% Glycemic control for   <7.0% adults with diabetes     CBG: Recent Labs  Lab 09/22/19 1958 09/22/19 2338 09/23/19 0346 09/23/19 0852 09/23/19 1142  GLUCAP 319* 248* 146* 120* 322*    Recent Results (from the past 240 hour(s))  Blood Culture (routine x 2)     Status: None   Collection Time: 09/17/19  7:54 AM   Specimen: BLOOD  Result Value Ref Range Status   Specimen Description   Final    BLOOD RIGHT ANTECUBITAL Performed at [facility], [address] [place], [place]  [address]    Special Requests   Final    BOTTLES DRAWN AEROBIC AND ANAEROBIC Blood Culture adequate volume Performed at [facility] [address], [place], [place] [address]    Culture   Final    NO GROWTH 5 DAYS Performed at [facility], [place] [address] [place], [place] [address]    Report Status 09/22/2019 FINAL  Final  Urine culture     Status: None   Collection Time: 09/17/19  7:54 AM   Specimen: In/Out Cath Urine  Result Value Ref Range Status   Specimen Description   Final    IN/OUT CATH URINE Performed at [facility] [address], [place], [place] [address]    Special Requests   Final    NONE Performed at [facility], [place] [address] [place], [place] [address]    Culture   Final    NO GROWTH Performed at [facility], [place] [address] [place], [place] [address]    Report Status 09/18/2019 FINAL  Final  Blood Culture (routine x 2)     Status: None   Collection Time: 09/17/19  7:57 AM   Specimen: BLOOD  Result Value Ref Range Status   Specimen Description   Final    BLOOD LEFT ANTECUBITAL Performed at [facility] [address], [place], [place] [address]    Special Requests   Final    BOTTLES DRAWN AEROBIC AND ANAEROBIC Blood Culture adequate volume  Performed at [facility] [address], [place], [place] [address]    Culture   Final    NO GROWTH 5 DAYS Performed at [facility], [place] [address], [place], [place] [address]    Report Status 09/22/2019 FINAL  Final  SARS CORONAVIRUS 2 (TAT 6-24 HRS) Nasopharyngeal Nasopharyngeal Swab     Status: Abnormal   Collection Time: 09/17/19 10:22 AM   Specimen: Nasopharyngeal Swab  Result Value Ref Range Status   SARS Coronavirus 2 POSITIVE (A) NEGATIVE Final    Comment: RESULT CALLED TO, READ BACK BY AND VERIFIED WITH: S.WEST,RN [id number] I.MANNING (NOTE) SARS-CoV-2 target nucleic acids are DETECTED. The SARS-CoV-2 RNA is generally detectable in upper and lower respiratory specimens during the acute phase of infection. Positive results are indicative of active infection with SARS-CoV-2. Clinical  correlation with patient history and other diagnostic information is necessary to determine patient infection status. Positive results do  not rule out bacterial infection or co-infection with other viruses. The expected result is Negative. Fact Sheet for Patients: SugarRoll.be Fact Sheet for Healthcare Providers: https://www.woods-mathews.com/ This test is not yet approved or cleared by the [place] FDA and  has been authorized for detection and/or diagnosis of SARS-CoV-2 by FDA under an Emergency Use Authorization (EUA). This EUA will remain  in effect (meaning this test can be used) for the  duration of the COVID-19 declaration under Section 564(b)(1) of the Act, 21 U.S.C. section 360bbb-3(b)(1), unless the authorization is terminated or revoked sooner. Performed at [facility], [place] [address], [place],  [address]   Respiratory Panel by PCR     Status: None   Collection Time: 09/17/19 11:30 AM  Result Value Ref Range Status   Adenovirus NOT DETECTED NOT DETECTED Final   Coronavirus 229E NOT  DETECTED NOT DETECTED Final    Comment: (NOTE) The Coronavirus on the Respiratory Panel, DOES NOT test for the novel  Coronavirus (2019 nCoV)  Coronavirus HKU1 NOT DETECTED NOT DETECTED Final   Coronavirus NL63 NOT DETECTED NOT DETECTED Final   Coronavirus OC43 NOT DETECTED NOT DETECTED Final   Metapneumovirus NOT DETECTED NOT DETECTED Final   Rhinovirus / Enterovirus NOT DETECTED NOT DETECTED Final   Influenza A NOT DETECTED NOT DETECTED Final   Influenza B NOT DETECTED NOT DETECTED Final   Parainfluenza Virus 1 NOT DETECTED NOT DETECTED Final   Parainfluenza Virus 2 NOT DETECTED NOT DETECTED Final   Parainfluenza Virus 3 NOT DETECTED NOT DETECTED Final   Parainfluenza Virus 4 NOT DETECTED NOT DETECTED Final   Respiratory Syncytial Virus NOT DETECTED NOT DETECTED Final   Bordetella pertussis NOT DETECTED NOT DETECTED Final   Chlamydophila pneumoniae NOT DETECTED NOT DETECTED Final   Mycoplasma pneumoniae NOT DETECTED NOT DETECTED Final    Comment: Performed at [facility], [address] [place], [place] [address]  MRSA PCR Screening     Status: None   Collection Time: 09/17/19  8:15 PM   Specimen: Nasal Mucosa; Nasopharyngeal  Result Value Ref Range Status   MRSA by PCR NEGATIVE NEGATIVE Final    Comment:        The GeneXpert MRSA Assay (FDA approved for NASAL specimens only), is one component of a comprehensive MRSA colonization surveillance program. It is not intended to diagnose MRSA infection nor to guide or monitor treatment for MRSA infections. Performed at [facility], [address] [place], [place] [address]      Scheduled Meds: . Chlorhexidine Gluconate Cloth  6 each Topical Daily  . dexamethasone (DECADRON) injection  6 mg Intravenous Q24H  . enoxaparin (LOVENOX) injection  1 mg/kg Subcutaneous Q12H  . insulin aspart  0-15 Units Subcutaneous Q4H  . insulin glargine  23 Units Subcutaneous Daily  . mouth rinse  15 mL Mouth Rinse  BID  . metoprolol tartrate  5 mg Intravenous Q8H  . thiamine injection  100 mg Intravenous Daily    LOS: 6 days   Osvaldo ShipperGokul Dalasia [place]more 09/23/2019  [facility] Office  [phone] Pager - Text Page per Loretha StaplerAmion  If 7PM-7AM, please contact night-coverage per Amion 09/23/2019, 2:28 PM

## 2019-09-23 NOTE — Progress Notes (Signed)
ANTICOAGULATION CONSULT NOTE - Follow Up Consult  Pharmacy Consult for Lovenox Indication: VTE prophylaxis  (PTA Xarelto for portal vein thrombosis)  No Known Allergies  Patient Measurements: Height: 5\' 11"  (180.3 cm) Weight: 216 lb 14.9 oz (98.4 kg) IBW/kg (Calculated) : 75.3 Heparin Dosing Weight:  Vital Signs: Temp: 98.1 F (36.7 C) (11/11 0800) Temp Source: Oral (11/11 0800) BP: 111/69 (11/11 0800) Pulse Rate: 81 (11/11 0800)  Labs: Recent Labs    09/21/19 0605 09/22/19 1050  HGB 17.7* 19.0*  HCT 56.4* 58.0*  PLT 102* 119*  CREATININE 1.19 0.98    Estimated Creatinine Clearance: 99.4 mL/min (by C-G formula based on SCr of 0.98 mg/dL).   Medications:  Scheduled:  . Chlorhexidine Gluconate Cloth  6 each Topical Daily  . dexamethasone (DECADRON) injection  6 mg Intravenous Q24H  . enoxaparin (LOVENOX) injection  1 mg/kg Subcutaneous Q12H  . insulin aspart  0-15 Units Subcutaneous Q4H  . insulin glargine  23 Units Subcutaneous Daily  . mouth rinse  15 mL Mouth Rinse BID  . metoprolol tartrate  5 mg Intravenous Q8H  . thiamine injection  100 mg Intravenous Daily   Infusions:  . famotidine (PEPCID) IV 20 mg (09/23/19 3846)    Assessment: 45 yoM admitted on 11/5 with COVID-19 pneumonia.  PMH is significant for portal vein thrombosis on chronic Xarelto anticoagulation.  He was transitioned to Lovenox until able to take PO medications.    Of note, patient passed a swallow and was started on dysphagia diet. H/H up. Plt stable. D-dimer 0.54. SCr wnl   Goal of Therapy:  Anti-Xa level 0.6-1 units/ml 4hrs after LMWH dose given Monitor platelets by anticoagulation protocol: Yes   Plan:  Continue Lovenox 100 mg SQ q12h  Follow renal function, signs and symptoms of bleeding CBC q72h  Albertina Parr, PharmD., BCPS Clinical Pharmacist Clinical phone for 09/23/19 until 5pm: [phone]

## 2019-09-24 DIAGNOSIS — D751 Secondary polycythemia: Secondary | ICD-10-CM | POA: Diagnosis not present

## 2019-09-24 DIAGNOSIS — I81 Portal vein thrombosis: Secondary | ICD-10-CM | POA: Diagnosis not present

## 2019-09-24 DIAGNOSIS — U071 COVID-19: Secondary | ICD-10-CM | POA: Diagnosis not present

## 2019-09-24 DIAGNOSIS — G9341 Metabolic encephalopathy: Secondary | ICD-10-CM | POA: Diagnosis not present

## 2019-09-24 LAB — BASIC METABOLIC PANEL
Anion gap: 10 (ref 5–15)
BUN: 22 mg/dL — ABNORMAL HIGH (ref 6–20)
CO2: 27 mmol/L (ref 22–32)
Calcium: 8.9 mg/dL (ref 8.9–10.3)
Chloride: 106 mmol/L (ref 98–111)
Creatinine, Ser: 1.17 mg/dL (ref 0.61–1.24)
GFR calc Af Amer: >60 mL/min (ref >60)
GFR calc non Af Amer: >60 mL/min (ref >60)
Glucose, Bld: 54 mg/dL — ABNORMAL LOW (ref 70–99)
Potassium: 3.8 mmol/L (ref 3.5–5.1)
Sodium: 143 mmol/L (ref 135–145)

## 2019-09-24 LAB — GLUCOSE, CAPILLARY
Glucose-Capillary: 151 mg/dL — ABNORMAL HIGH (ref 70–99)
Glucose-Capillary: 155 mg/dL — ABNORMAL HIGH (ref 70–99)
Glucose-Capillary: 160 mg/dL — ABNORMAL HIGH (ref 70–99)
Glucose-Capillary: 169 mg/dL — ABNORMAL HIGH (ref 70–99)
Glucose-Capillary: 208 mg/dL — ABNORMAL HIGH (ref 70–99)
Glucose-Capillary: 313 mg/dL — ABNORMAL HIGH (ref 70–99)
Glucose-Capillary: 68 mg/dL — ABNORMAL LOW (ref 70–99)

## 2019-09-24 LAB — MAGNESIUM: Magnesium: 2.3 mg/dL (ref 1.7–2.4)

## 2019-09-24 MED ORDER — RIVAROXABAN 20 MG PO TABS
20.0000 mg | ORAL_TABLET | Freq: Every day | ORAL | Status: DC
  Administered 2019-09-24 – 2019-10-18 (×25): 20 mg via ORAL
  Filled 2019-09-24 (×27): qty 1

## 2019-09-24 MED ORDER — INSULIN GLARGINE 100 UNIT/ML ~~LOC~~ SOLN
20.0000 [IU] | Freq: Every day | SUBCUTANEOUS | Status: DC
  Administered 2019-09-24 – 2019-09-26 (×3): 20 [IU] via SUBCUTANEOUS
  Filled 2019-09-24 (×3): qty 0.2

## 2019-09-24 MED ORDER — DEXAMETHASONE 4 MG PO TABS
4.0000 mg | ORAL_TABLET | Freq: Every day | ORAL | Status: AC
  Administered 2019-09-25 – 2019-09-28 (×4): 4 mg via ORAL
  Filled 2019-09-24 (×4): qty 1

## 2019-09-24 NOTE — Progress Notes (Addendum)
Maxwell Miller  ZOX:[id number] DOB: [birth year] DOA: 09/17/2019 PCP: Mable Paris, PA-C    Brief Narrative:   58 year old [place] [facility] resident with a history of vascular dementia, nontraumatic intracerebral hemorrhage, portal vein thrombosis on chronic Xarelto, and a diagnosis of COVID-19 10/28 who presented to the ED with 24 hours of severely elevated blood sugars, fever to 105, and altered mental status.  The patient is reportedly conversant at baseline.  Significant Events: 11/5 admit to [facility] ICU via [facility] long ED 11/6 transfer to progressive care unit  COVID-19 specific Treatment: Remdesivir 11/5 > Decadron 11/5 >  Subjective: Patient noted to be lying in the bed.  Follows a few commands.  Does not appear to be in any discomfort.  Not very communicative.  Assessment & Plan:  Pneumonia due to COVID-19  COVID-19 Labs  Recent Labs    09/22/19 1050  DDIMER 0.54*  FERRITIN 741*  CRP 2.0*    Lab Results  Component Value Date   SARSCOV2NAA POSITIVE (A) 09/17/2019   SARSCOV2NAA NOT DETECTED 03/30/2019   SARSCOV2NAA NOT DETECTED 03/06/2019    Patient presented with high fever with a temperature of 105.4 F.  He was encephalopathic.  He was placed on steroids Remdesivir.  He has completed course of remdesivir.  He remains on steroids.  Respiratory status is stable.  Inflammatory markers have improved.     SIRS/lactic acidosis Due to the above.  Now resolved.  Acute metabolic encephalopathy No acute findings on CT head.  This was thought to be mainly in the setting of acute COVID-19 infection, electrolyte derangement and hypernatremia.  Patient also was dehydrated.  He did have elevated ammonia level initially but this has normalized without intervention.  B12 and folate levels were normal.  Mentation has been slowly improving.  Continue to monitor.  Hyperammonemia Normalized without intervention.  History of portal vein thrombosis He is chronically  on rivaroxaban.  Currently on Lovenox due to poor oral intake.  Transition to rivaroxaban.  Acute kidney injury This was due to volume depletion and dehydration.  Resolved with IV fluids.  Creatinine was 1.67 at admission.  Improved to 0.98 yesterday.  Polycythemia This appears to be chronic.  Hypernatremia Secondary to dehydration.  Resolved with the D5 water.  Dysphagia Speech therapy is following.  He is on a dysphagia 1 diet.  Continue to monitor.  Aspiration precautions.  Diabetes mellitus type 2, uncontrolled with hyperglycemia, new diagnosis This appears to be a new diagnosis for him.  HbA1c 12.8.  CBGs noted to be fluctuating.  We will decrease dose of steroids.  Hypokalemia Was repleted.  Potassium is normal today.  DVT prophylaxis: Full dose Lovenox.  Change to rivaroxaban. Code Status: DO NOT INTUBATE  Family communication: Attempts to call his niece were not successful yesterday.  We will try again today. Disposition Plan: Will need SNF placement, niece wants different SNF facility, social worker consulted.  PT and OT to be consulted as well.    Consultants:  PCCM, signed off  Antimicrobials:  Anti-infectives (From admission, onward)   Start     Dose/Rate Route Frequency Ordered Stop   09/18/19 1000  remdesivir 100 mg in sodium chloride 0.9 % 250 mL IVPB     100 mg 500 mL/hr over 30 Minutes Intravenous Every 24 hours 09/17/19 1117 09/21/19 1055   09/17/19 1200  remdesivir 200 mg in sodium chloride 0.9 % 250 mL IVPB     200 mg 500 mL/hr over 30 Minutes Intravenous Once 09/17/19  1117 09/17/19 1249   09/17/19 0745  ceFEPIme (MAXIPIME) 2 g in sodium chloride 0.9 % 100 mL IVPB     2 g 200 mL/hr over 30 Minutes Intravenous  Once 09/17/19 0734 09/17/19 0837   09/17/19 0745  vancomycin (VANCOCIN) 1,750 mg in sodium chloride 0.9 % 500 mL IVPB     1,750 mg 250 mL/hr over 120 Minutes Intravenous  Once 09/17/19 0734 09/17/19 1104       Objective: Blood pressure (!)  142/99, pulse 73, temperature 98.6 F (37 C), temperature source Oral, resp. rate 18, height 5\' 11"  (1.803 m), weight 98.4 kg, SpO2 100 %.  Intake/Output Summary (Last 24 hours) at 09/24/2019 1119 Last data filed at 09/24/2019 1100 Gross per 24 hour  Intake 650 ml  Output 1000 ml  Net -350 ml   Filed Weights   09/17/19 0822 09/17/19 2200  Weight: (S) 81.6 kg 98.4 kg    Examination:  General appearance: Noted to be awake alert.  Distracted.  Tracking me around the room.  Follows certain commands.  Not very communicative.   Resp: Clear to auscultation bilaterally.  Normal effort Cardio: S1-S2 is normal regular.  No S3-S4.  No rubs murmurs or bruit GI: Abdomen is soft.  Nontender nondistended.  Bowel sounds are present normal.  No masses organomegaly Extremities: No edema.   Neurologic: No facial asymmetry.  Wiggles his toes and moves his feet on command.     CBC: Recent Labs  Lab 09/20/19 0409 09/21/19 0605 09/22/19 1050  WBC 7.2 6.3 5.8  NEUTROABS 4.9 3.4 2.6  HGB 17.9* 17.7* 19.0*  HCT 58.4* 56.4* 58.0*  MCV 91.8 90.7 87.6  PLT 134* 102* 119*   Basic Metabolic Panel: Recent Labs  Lab 09/20/19 0409 09/21/19 0605 09/22/19 1050 09/24/19 0420  NA 156* 149* 139 143  K 4.3 3.0* 3.0* 3.8  CL 120* 113* 105 106  CO2 25 28 24 27   GLUCOSE 199* 202* 149* 54*  BUN 33* 25* 20 22*  CREATININE 1.33* 1.19 0.98 1.17  CALCIUM 8.5* 8.3* 8.2* 8.9  MG 2.3 2.1 2.0 2.3  PHOS 2.6 2.2* 2.6  --    GFR: Estimated Creatinine Clearance: 83.3 mL/min (by C-G formula based on SCr of 1.17 mg/dL).  Liver Function Tests: Recent Labs  Lab 09/19/19 0509 09/20/19 0409 09/21/19 0605 09/22/19 1050  AST 36 35 45* 54*  ALT 22 22 24  35  ALKPHOS 53 51 52 56  BILITOT 1.0 1.0 1.3* 1.4*  PROT 7.2 6.9 7.0 7.3  ALBUMIN 2.8* 2.7* 2.8* 3.0*    Coagulation Profile: No results for input(s): INR, PROTIME in the last 168 hours.  HbA1C: Hgb A1c MFr Bld  Date/Time Value Ref Range Status   09/17/2019 12:42 PM 12.8 (H) 4.8 - 5.6 % Final    Comment:    (NOTE) Pre diabetes:          5.7%-6.4% Diabetes:              >6.4% Glycemic control for   <7.0% adults with diabetes     CBG: Recent Labs  Lab 09/23/19 2019 09/24/19 0018 09/24/19 0447 09/24/19 0655 09/24/19 0935  GLUCAP 303* 151* 68* 160* 208*    Recent Results (from the past 240 hour(s))  Blood Culture (routine x 2)     Status: None   Collection Time: 09/17/19  7:54 AM   Specimen: BLOOD  Result Value Ref Range Status   Specimen Description   Final    BLOOD RIGHT  ANTECUBITAL Performed at [facility], [address], [place], [place] [address]    Special Requests   Final    BOTTLES DRAWN AEROBIC AND ANAEROBIC Blood Culture adequate volume Performed at [facility], [address], [place], [place] [address]    Culture   Final    NO GROWTH 5 DAYS Performed at [facility], [address] [place], [place] [address]    Report Status 09/22/2019 FINAL  Final  Urine culture     Status: None   Collection Time: 09/17/19  7:54 AM   Specimen: In/Out Cath Urine  Result Value Ref Range Status   Specimen Description   Final    IN/OUT CATH URINE Performed at [facility], [address], [place], [place] [address]    Special Requests   Final    NONE Performed at [facility], [address], [place], [place] [address]    Culture   Final    NO GROWTH Performed at [facility], [address], [place], [place] [address]    Report Status 09/18/2019 FINAL  Final  Blood Culture (routine x 2)     Status: None   Collection Time: 09/17/19  7:57 AM   Specimen: BLOOD  Result Value Ref Range Status   Specimen Description   Final    BLOOD LEFT ANTECUBITAL Performed at [facility], [address], [place], [place] [address]    Special Requests   Final    BOTTLES DRAWN AEROBIC AND ANAEROBIC Blood Culture  adequate volume Performed at [facility], [address] [place], [place] [address]    Culture   Final    NO GROWTH 5 DAYS Performed at [facility], [address] [place], [place] [address]    Report Status 09/22/2019 FINAL  Final  SARS CORONAVIRUS 2 (TAT 6-24 HRS) Nasopharyngeal Nasopharyngeal Swab     Status: Abnormal   Collection Time: 09/17/19 10:22 AM   Specimen: Nasopharyngeal Swab  Result Value Ref Range Status   SARS Coronavirus 2 POSITIVE (A) NEGATIVE Final    Comment: RESULT CALLED TO, READ BACK BY AND VERIFIED WITH: S.WEST,RN [id number] I.MANNING (NOTE) SARS-CoV-2 target nucleic acids are DETECTED. The SARS-CoV-2 RNA is generally detectable in upper and lower respiratory specimens during the acute phase of infection. Positive results are indicative of active infection with SARS-CoV-2. Clinical  correlation with patient history and other diagnostic information is necessary to determine patient infection status. Positive results do  not rule out bacterial infection or co-infection with other viruses. The expected result is Negative. Fact Sheet for Patients: HairSlick.no Fact Sheet for Healthcare Providers: quierodirigir.com This test is not yet approved or cleared by the [place] FDA and  has been authorized for detection and/or diagnosis of SARS-CoV-2 by FDA under an Emergency Use Authorization (EUA). This EUA will remain  in effect (meaning this test can be used) for the  duration of the COVID-19 declaration under Section 564(b)(1) of the Act, 21 U.S.C. section 360bbb-3(b)(1), unless the authorization is terminated or revoked sooner. Performed at [facility], [address], [place], [place] [address]   Respiratory Panel by PCR     Status: None   Collection Time: 09/17/19 11:30 AM  Result Value Ref Range Status   Adenovirus NOT DETECTED NOT DETECTED Final    Coronavirus 229E NOT DETECTED NOT DETECTED Final    Comment: (NOTE) The Coronavirus on the  Respiratory Panel, DOES NOT test for the novel  Coronavirus (2019 nCoV)    Coronavirus HKU1 NOT DETECTED NOT DETECTED Final   Coronavirus NL63 NOT DETECTED NOT DETECTED Final   Coronavirus OC43 NOT DETECTED NOT DETECTED Final   Metapneumovirus NOT DETECTED NOT DETECTED Final   Rhinovirus / Enterovirus NOT DETECTED NOT DETECTED Final   Influenza A NOT DETECTED NOT DETECTED Final   Influenza B NOT DETECTED NOT DETECTED Final   Parainfluenza Virus 1 NOT DETECTED NOT DETECTED Final   Parainfluenza Virus 2 NOT DETECTED NOT DETECTED Final   Parainfluenza Virus 3 NOT DETECTED NOT DETECTED Final   Parainfluenza Virus 4 NOT DETECTED NOT DETECTED Final   Respiratory Syncytial Virus NOT DETECTED NOT DETECTED Final   Bordetella pertussis NOT DETECTED NOT DETECTED Final   Chlamydophila pneumoniae NOT DETECTED NOT DETECTED Final   Mycoplasma pneumoniae NOT DETECTED NOT DETECTED Final    Comment: Performed at [facility], [place] [address] [place], [place] [address]  MRSA PCR Screening     Status: None   Collection Time: 09/17/19  8:15 PM   Specimen: Nasal Mucosa; Nasopharyngeal  Result Value Ref Range Status   MRSA by PCR NEGATIVE NEGATIVE Final    Comment:        The GeneXpert MRSA Assay (FDA approved for NASAL specimens only), is one component of a comprehensive MRSA colonization surveillance program. It is not intended to diagnose MRSA infection nor to guide or monitor treatment for MRSA infections. Performed at [facility], [place] [address] [place], [place] [address]      Scheduled Meds: . Chlorhexidine Gluconate Cloth  6 each Topical Daily  . dexamethasone (DECADRON) injection  6 mg Intravenous Q24H  . enoxaparin (LOVENOX) injection  1 mg/kg Subcutaneous Q12H  . insulin aspart  0-15 Units Subcutaneous Q4H  . insulin glargine  20 Units Subcutaneous Daily  . mouth  rinse  15 mL Mouth Rinse BID  . metoprolol tartrate  5 mg Intravenous Q8H  . thiamine injection  100 mg Intravenous Daily    LOS: 7 days   Bonnielee Haff 09/24/2019  [facility] Office  [phone] Pager - Text Page per Shea Evans  If 7PM-7AM, please contact night-coverage per Amion 09/24/2019, 11:19 AM

## 2019-09-24 NOTE — Progress Notes (Signed)
Inpatient Diabetes Program Recommendations  AACE/ADA: New Consensus Statement on Inpatient Glycemic Control   Target Ranges:  Prepandial:   less than 140 mg/dL      Peak postprandial:   less than 180 mg/dL (1-2 hours)      Critically ill patients:  140 - 180 mg/dL   Results for Maxwell Miller, Maxwell Miller (MRN [id number]) as of 09/24/2019 09:59  Ref. Range 09/23/2019 08:52 09/23/2019 11:42 09/23/2019 15:23 09/23/2019 19:56 09/23/2019 20:19 09/24/2019 00:18 09/24/2019 04:47 09/24/2019 06:55 09/24/2019 09:35  Glucose-Capillary Latest Ref Range: 70 - 99 mg/dL 120 (H)  Lantus 23 units 322 (H)  Novolog 11 units 344 (H)  Novolog 11 units 270 (H) 303 (H)  Novolog 11 units 151 (H)  Novolog 3 units @ 1:15 68 (L) 160 (H) 208 (H)   Review of Glycemic Control  Current orders for Inpatient glycemic control:Lantus 20 units daily, Novolog 0-15 units Q4H; Decadron 6 mg Q24H  Inpatient Diabetes Program Recommendations:  Insulin-Basal: Noted Lantus has already been given this morning.  If steroids are continued, may want to consider changing basal insulin to NPH or Levemir which may help with increase in glucose related to steroids.  NOTE: Per chart, patient is not eating well (0-15% of meals), getting Novolog correction Q4H. Noted hypoglycemia this morning at 4:47 am after getting Novolog 3 units at 1:15 am. Anticipate hypoglycemia this morning more likely from Novolog correction.  In reviewing glucose trends, glucose appears to be more elevated starting mid day. Question if NPH or Levemir would be a better basal insulin to use due to how it peaks which may help with mid day hyperglycemia noted in trends.   Thanks, Barnie Alderman, RN, MSN, CDE Diabetes Coordinator Inpatient Diabetes Program [phone] (Team Pager from 8am to 5pm)

## 2019-09-24 NOTE — Progress Notes (Signed)
ANTICOAGULATION CONSULT NOTE - Follow Up Consult  Pharmacy Consult for Lovenox > Xarelto  Indication: H/o Portal vein thrombosos    No Known Allergies  Patient Measurements: Height: 5\' 11"  (180.3 cm) Weight: 216 lb 14.9 oz (98.4 kg) IBW/kg (Calculated) : 75.3 Heparin Dosing Weight:  Vital Signs: Temp: 98.6 F (37 C) (11/12 0800) Temp Source: Oral (11/12 0800) BP: 142/99 (11/12 0800) Pulse Rate: 73 (11/12 0800)  Labs: Recent Labs    09/22/19 1050 09/24/19 0420  HGB 19.0*  --   HCT 58.0*  --   PLT 119*  --   CREATININE 0.98 1.17    Estimated Creatinine Clearance: 83.3 mL/min (by C-G formula based on SCr of 1.17 mg/dL).   Medications:  Scheduled:  . Chlorhexidine Gluconate Cloth  6 each Topical Daily  . [START ON 09/25/2019] dexamethasone  4 mg Oral Daily  . enoxaparin (LOVENOX) injection  1 mg/kg Subcutaneous Q12H  . insulin aspart  0-15 Units Subcutaneous Q4H  . insulin glargine  20 Units Subcutaneous Daily  . mouth rinse  15 mL Mouth Rinse BID  . metoprolol tartrate  5 mg Intravenous Q8H  . thiamine injection  100 mg Intravenous Daily   Infusions:  . famotidine (PEPCID) IV 20 mg (09/24/19 1038)    Assessment: 79 yoM admitted on 11/5 with COVID-19 pneumonia.  PMH is significant for portal vein thrombosis on chronic Xarelto anticoagulation.  He was transitioned to Lovenox until able to take PO medications. Now resuming home Xarelto. SCr wnl.  Of note last dose of Lovenox was this AM at 0627   Goal of Therapy:  Monitor platelets by anticoagulation protocol: Yes   Plan:  Stop Lovenox and start Xarelto 20 mg daily. First dose this evening  Monitor renal fx and s/s of bleeding   Albertina Parr, PharmD., BCPS Clinical Pharmacist Clinical phone for 09/23/19 until 5pm: [phone]

## 2019-09-25 DIAGNOSIS — U071 COVID-19: Secondary | ICD-10-CM | POA: Diagnosis not present

## 2019-09-25 DIAGNOSIS — D751 Secondary polycythemia: Secondary | ICD-10-CM | POA: Diagnosis not present

## 2019-09-25 DIAGNOSIS — G9341 Metabolic encephalopathy: Secondary | ICD-10-CM | POA: Diagnosis not present

## 2019-09-25 DIAGNOSIS — I81 Portal vein thrombosis: Secondary | ICD-10-CM | POA: Diagnosis not present

## 2019-09-25 LAB — BASIC METABOLIC PANEL
Anion gap: 10 (ref 5–15)
BUN: 20 mg/dL (ref 6–20)
CO2: 27 mmol/L (ref 22–32)
Calcium: 8.7 mg/dL — ABNORMAL LOW (ref 8.9–10.3)
Chloride: 104 mmol/L (ref 98–111)
Creatinine, Ser: 0.99 mg/dL (ref 0.61–1.24)
GFR calc Af Amer: >60 mL/min (ref >60)
GFR calc non Af Amer: >60 mL/min (ref >60)
Glucose, Bld: 102 mg/dL — ABNORMAL HIGH (ref 70–99)
Potassium: 3.5 mmol/L (ref 3.5–5.1)
Sodium: 141 mmol/L (ref 135–145)

## 2019-09-25 LAB — GLUCOSE, CAPILLARY
Glucose-Capillary: 115 mg/dL — ABNORMAL HIGH (ref 70–99)
Glucose-Capillary: 120 mg/dL — ABNORMAL HIGH (ref 70–99)
Glucose-Capillary: 133 mg/dL — ABNORMAL HIGH (ref 70–99)
Glucose-Capillary: 328 mg/dL — ABNORMAL HIGH (ref 70–99)
Glucose-Capillary: 208 mg/dL — ABNORMAL HIGH (ref 70–99)

## 2019-09-25 MED ORDER — POTASSIUM CHLORIDE CRYS ER 20 MEQ PO TBCR
40.0000 meq | EXTENDED_RELEASE_TABLET | Freq: Once | ORAL | Status: AC
  Administered 2019-09-25: 40 meq via ORAL
  Filled 2019-09-25: qty 2

## 2019-09-25 NOTE — Evaluation (Signed)
Physical Therapy Evaluation Patient Details Name: Maxwell Miller MRN: [id number] DOB: 05/27/1961 Today's Date: 09/25/2019   History of Present Illness  58 y/o w/ hx of vascular dementia, CVA, portal vein thrombosis, non traumatic intracerebral hemorrhage, HLD, HTN, COVID 19  Clinical Impression   Pt admitted with above diagnosis. States prior to admission was using walker and able to ambulate at home. Pt currently with functional limitations due to the deficits listed below (see PT Problem List). Pt is non verbal hence difficult to get entire hx from him, also seems agitated at therapist asking him to get oob yet he has pulled out condom cath and bed is soaked w/ urine. He is needing mod a with most mobility, able to stand from elevated bed but takes x 2 attempt to complete. Once standing able to take few steps from bed to recliner with min a and RW. Pt is on room air throughout and sats are reading in 90s, via pedi finger probe. Pt will benefit from skilled PT to increase their independence and safety with mobility to allow discharge to the venue listed below.       Follow Up Recommendations SNF    Equipment Recommendations  None recommended by PT    Recommendations for Other Services       Precautions / Restrictions Precautions Precautions: Fall;Other (comment)(non verbal) Restrictions Weight Bearing Restrictions: No      Mobility  Bed Mobility Overal bed mobility: Needs Assistance Bed Mobility: Supine to Sit     Supine to sit: Mod assist        Transfers Overall transfer level: Needs assistance Equipment used: Rolling walker (2 wheeled) Transfers: Sit to/from Stand Sit to Stand: Mod assist            Ambulation/Gait Ambulation/Gait assistance: Min assist Gait Distance (Feet): 4 Feet Assistive device: Rolling walker (2 wheeled) Gait Pattern/deviations: Shuffle     General Gait Details: took few steps from bed to recliner  Stairs             Wheelchair Mobility    Modified Rankin (Stroke Patients Only)       Balance Overall balance assessment: Needs assistance Sitting-balance support: Feet unsupported Sitting balance-Leahy Scale: Good     Standing balance support: During functional activity;Bilateral upper extremity supported Standing balance-Leahy Scale: Fair                               Pertinent Vitals/Pain Pain Assessment: No/denies pain    Home Living Family/patient expects to be discharged to:: Skilled nursing facility                      Prior Function Level of Independence: Needs assistance   Gait / Transfers Assistance Needed: unsure, states used walker   ADL's / Homemaking Assistance Needed: needed assist        Hand Dominance   Dominant Hand: Right    Extremity/Trunk Assessment   Upper Extremity Assessment Upper Extremity Assessment: Defer to OT evaluation    Lower Extremity Assessment Lower Extremity Assessment: Generalized weakness       Communication   Communication: Expressive difficulties;Other (comment)(non verbal, able to nod and shake head yes/no)  Cognition Arousal/Alertness: Lethargic Behavior During Therapy: Agitated Overall Cognitive Status: No family/caregiver present to determine baseline cognitive functioning  General Comments: not sure if imapred or agitated      General Comments General comments (skin integrity, edema, etc.): Pt on room air, sats in 90s    Exercises     Assessment/Plan    PT Assessment Patient needs continued PT services  PT Problem List Decreased strength;Decreased activity tolerance;Decreased balance;Decreased mobility;Decreased safety awareness       PT Treatment Interventions Gait training;Functional mobility training;Therapeutic activities;Therapeutic exercise;Balance training;Neuromuscular re-education;Patient/family education    PT Goals (Current goals can be  found in the Care Plan section)  Acute Rehab PT Goals Patient Stated Goal: unable to state any goals Time For Goal Achievement: 10/09/19 Potential to Achieve Goals: Fair    Frequency Min 2X/week   Barriers to discharge        Co-evaluation               AM-PAC PT "6 Clicks" Mobility  Outcome Measure Help needed turning from your back to your side while in a flat bed without using bedrails?: A Lot Help needed moving from lying on your back to sitting on the side of a flat bed without using bedrails?: A Lot Help needed moving to and from a bed to a chair (including a wheelchair)?: A Lot Help needed standing up from a chair using your arms (e.g., wheelchair or bedside chair)?: A Lot Help needed to walk in hospital room?: Total Help needed climbing 3-5 steps with a railing? : A Lot 6 Click Score: 11    End of Session   Activity Tolerance: Patient limited by fatigue;Patient limited by lethargy;Treatment limited secondary to agitation Patient left: in chair;with chair alarm set;with call bell/phone within reach   PT Visit Diagnosis: Other abnormalities of gait and mobility (R26.89);Muscle weakness (generalized) (M62.81)    Time: 1610-9604 PT Time Calculation (min) (ACUTE ONLY): 27 min   Charges:   PT Evaluation $PT Eval Moderate Complexity: 1 Mod PT Treatments $Therapeutic Activity: 8-22 mins        Horald Chestnut, PT   Delford Field 09/25/2019, 1:21 PM

## 2019-09-25 NOTE — Progress Notes (Signed)
Maxwell Miller  ZDG:[id number] DOB: 1961-04-17 DOA: 09/17/2019 PCP: Mable Paris, PA-C    Brief Narrative:  58 year old [place] [facility] resident with a history of vascular dementia, nontraumatic intracerebral hemorrhage, portal vein thrombosis on chronic Xarelto, and a diagnosis of COVID-19 10/28 who presented to the ED with 24 hours of severely elevated blood sugars, fever to 105, and altered mental status.  The patient is reportedly conversant at baseline.  Significant Events: 11/5 admit to [facility] ICU via [facility] long ED 11/6 transfer to progressive care unit  COVID-19 specific Treatment: Remdesivir 11/5 > Decadron 11/5 >  Subjective: Patient noted to be awake alert.  He actually spoke a few words today.  Denies any complaints.  Remains taciturn.    Assessment & Plan:  Pneumonia due to COVID-19  Lab Results  Component Value Date   SARSCOV2NAA POSITIVE (A) 09/17/2019   SARSCOV2NAA NOT DETECTED 03/30/2019   SARSCOV2NAA NOT DETECTED 03/06/2019    Patient presented with high fever with a temperature of 105.4 F.  He was encephalopathic.  He has completed course of remdesivir.  Steroid is being tapered down.  Respiratory status is stable.  He saturating normal on room air.  Inflammatory markers had improved.      SIRS/lactic acidosis Due to the above.  Now resolved.  Acute metabolic encephalopathy No acute findings on CT head.  This was thought to be mainly in the setting of acute COVID-19 infection, electrolyte derangement and hypernatremia.  Patient also was dehydrated.  He did have elevated ammonia level initially but this has normalized without intervention.  B12 and folate levels were normal.  Mental status is slowly improving.  He actually spoke a few words today.  Hyperammonemia Normalized without intervention.  History of portal vein thrombosis Chronically on rivaroxaban.  Was on Lovenox here due to poor oral intake.  Now back on rivaroxaban.  Acute  kidney injury This was due to volume depletion and dehydration.  Resolved with IV fluids.    Polycythemia This appears to be chronic.  Hypernatremia Secondary to dehydration.  Resolved with the D5 water.  Dysphagia Speech therapy is following.  He is on a dysphagia 1 diet. Aspiration precautions.  Diabetes mellitus type 2, uncontrolled with hyperglycemia, new diagnosis This appears to be a new diagnosis for him.  HbA1c 12.8.  CBG should improve as steroid is tapered down.  Lantus dose being adjusted..  Hypokalemia Replace potassium.  DVT prophylaxis: Full dose Lovenox.  Change to rivaroxaban. Code Status: DO NOT INTUBATE  Family communication: Discussed with niece yesterday. Disposition Plan: Will need SNF placement, niece wants different SNF facility, social worker consulted.  PT and OT to be consulted as well.    Consultants:  PCCM, signed off  Antimicrobials:  Anti-infectives (From admission, onward)   Start     Dose/Rate Route Frequency Ordered Stop   09/18/19 1000  remdesivir 100 mg in sodium chloride 0.9 % 250 mL IVPB     100 mg 500 mL/hr over 30 Minutes Intravenous Every 24 hours 09/17/19 1117 09/21/19 1055   09/17/19 1200  remdesivir 200 mg in sodium chloride 0.9 % 250 mL IVPB     200 mg 500 mL/hr over 30 Minutes Intravenous Once 09/17/19 1117 09/17/19 1249   09/17/19 0745  ceFEPIme (MAXIPIME) 2 g in sodium chloride 0.9 % 100 mL IVPB     2 g 200 mL/hr over 30 Minutes Intravenous  Once 09/17/19 0734 09/17/19 0837   09/17/19 0745  vancomycin (VANCOCIN) 1,750 mg in sodium  chloride 0.9 % 500 mL IVPB     1,750 mg 250 mL/hr over 120 Minutes Intravenous  Once 09/17/19 0734 09/17/19 1104       Objective: Blood pressure 126/88, pulse 66, temperature 98.3 F (36.8 C), temperature source Oral, resp. rate 15, height  (1.803 m), weight 98.4 kg, SpO2 96 %.  Intake/Output Summary (Last 24 hours) at 09/25/2019 1055 Last data filed at 09/24/2019 1100 Gross per 24 hour   Intake -  Output 300 ml  Net -300 ml   Filed Weights   09/17/19 0822 09/17/19 2200  Weight: (S) 81.6 kg 98.4 kg    Examination:  General appearance: Awake alert.  Distracted.   Resp: Clear to auscultation bilaterally.  Normal effort Cardio: S1-S2 is normal regular.  No S3-S4.  No rubs murmurs or bruit GI: Abdomen is soft.  Nontender nondistended.  Bowel sounds are present normal.  No masses organomegaly Extremities: No edema.  Does move his extremities on command. Neurologic: No obvious focal neurological deficits.    CBC: Recent Labs  Lab 09/20/19 0409 09/21/19 0605 09/22/19 1050  WBC 7.2 6.3 5.8  NEUTROABS 4.9 3.4 2.6  HGB 17.9* 17.7* 19.0*  HCT 58.4* 56.4* 58.0*  MCV 91.8 90.7 87.6  PLT 134* 102* 119*   Basic Metabolic Panel: Recent Labs  Lab 09/20/19 0409 09/21/19 0605 09/22/19 1050 09/24/19 0420 09/25/19 0325  NA 156* 149* 139 143 141  K 4.3 3.0* 3.0* 3.8 3.5  CL 120* 113* 105 106 104  CO2 GLUCOSE 199* 202* 149* 54* 102*  BUN 33* 25* 20 22* 20  CREATININE 1.33* 1.19 0.98 1.17 0.99  CALCIUM 8.5* 8.3* 8.2* 8.9 8.7*  MG 2.3 2.1 2.0 2.3  --   PHOS 2.6 2.2* 2.6  --   --    GFR: Estimated Creatinine Clearance: 98.4 mL/min (by C-G formula based on SCr of 0.99 mg/dL).  Liver Function Tests: Recent Labs  Lab 09/19/19 0509 09/20/19 0409 09/21/19 0605 09/22/19 1050  AST 36 35 45* 54*  ALT 35  ALKPHOS 53 51 52 56  BILITOT 1.0 1.0 1.3* 1.4*  PROT 7.2 6.9 7.0 7.3  ALBUMIN 2.8* 2.7* 2.8* 3.0*    Coagulation Profile: No results for input(s): INR, PROTIME in the last 168 hours.  HbA1C: Hgb A1c MFr Bld  Date/Time Value Ref Range Status  09/17/2019 12:42 PM 12.8 (H) 4.8 - 5.6 % Final    Comment:    (NOTE) Pre diabetes:          5.7%-6.4% Diabetes:              >6.4% Glycemic control for   <7.0% adults with diabetes     CBG: Recent Labs  Lab 09/24/19 1715 09/24/19 2121 09/25/19 0105 09/25/19 0642 09/25/19 0813   GLUCAP 169* 155* 115* 120* 133*    Recent Results (from the past 240 hour(s))  Blood Culture (routine x 2)     Status: None   Collection Time: 09/17/19  7:54 AM   Specimen: BLOOD  Result Value Ref Range Status   Specimen Description   Final    BLOOD RIGHT ANTECUBITAL Performed at [facility], [address], [place], [place] [address]    Special Requests   Final    BOTTLES DRAWN AEROBIC AND ANAEROBIC Blood Culture adequate volume Performed at [facility], [address], [place], [place] [address]    Culture   Final  NO GROWTH 5 DAYS Performed at [facility], [address] [place], [place] [address]    Report Status 09/22/2019 FINAL  Final  Urine culture     Status: None   Collection Time: 09/17/19  7:54 AM   Specimen: In/Out Cath Urine  Result Value Ref Range Status   Specimen Description   Final    IN/OUT CATH URINE Performed at [facility], [address] [place], [place] [address]    Special Requests   Final    NONE Performed at [facility], [address] [place], [place] [address]    Culture   Final    NO GROWTH Performed at [facility], [address] [place], [place] [address]    Report Status 09/18/2019 FINAL  Final  Blood Culture (routine x 2)     Status: None   Collection Time: 09/17/19  7:57 AM   Specimen: BLOOD  Result Value Ref Range Status   Specimen Description   Final    BLOOD LEFT ANTECUBITAL Performed at [facility], [address] [place], [place] [address]    Special Requests   Final    BOTTLES DRAWN AEROBIC AND ANAEROBIC Blood Culture adequate volume Performed at [facility], [address] [place], [place] [address]    Culture   Final    NO GROWTH 5 DAYS Performed at [facility], [address] [place], [place] [address]    Report Status 09/22/2019 FINAL  Final  SARS CORONAVIRUS 2 (TAT 6-24  HRS) Nasopharyngeal Nasopharyngeal Swab     Status: Abnormal   Collection Time: 09/17/19 10:22 AM   Specimen: Nasopharyngeal Swab  Result Value Ref Range Status   SARS Coronavirus 2 POSITIVE (A) NEGATIVE Final    Comment: RESULT CALLED TO, READ BACK BY AND VERIFIED WITH: S.WEST,RN [id number] I.MANNING (NOTE) SARS-CoV-2 target nucleic acids are DETECTED. The SARS-CoV-2 RNA is generally detectable in upper and lower respiratory specimens during the acute phase of infection. Positive results are indicative of active infection with SARS-CoV-2. Clinical  correlation with patient history and other diagnostic information is necessary to determine patient infection status. Positive results do  not rule out bacterial infection or co-infection with other viruses. The expected result is Negative. Fact Sheet for Patients: HairSlick.nohttps://www.fda.gov/media/138098/download Fact Sheet for Healthcare Providers: quierodirigir.comhttps://www.fda.gov/media/138095/download This test is not yet approved or cleared by the [place] FDA and  has been authorized for detection and/or diagnosis of SARS-CoV-2 by FDA under an Emergency Use Authorization (EUA). This EUA will remain  in effect (meaning this test can be used) for the  duration of the COVID-19 declaration under Section 564(b)(1) of the Act, 21 U.S.C. section 360bbb-3(b)(1), unless the authorization is terminated or revoked sooner. Performed at [facility], [address] [place], [place] [address]   Respiratory Panel by PCR     Status: None   Collection Time: 09/17/19 11:30 AM  Result Value Ref Range Status   Adenovirus NOT DETECTED NOT DETECTED Final   Coronavirus 229E NOT DETECTED NOT DETECTED Final    Comment: (NOTE) The Coronavirus on the Respiratory Panel, DOES NOT test for the novel  Coronavirus (2019 nCoV)    Coronavirus HKU1 NOT DETECTED NOT DETECTED Final   Coronavirus NL63 NOT DETECTED NOT DETECTED Final   Coronavirus OC43 NOT DETECTED NOT  DETECTED Final   Metapneumovirus NOT DETECTED NOT DETECTED Final   Rhinovirus / Enterovirus NOT DETECTED NOT DETECTED  Final   Influenza A NOT DETECTED NOT DETECTED Final   Influenza B NOT DETECTED NOT DETECTED Final   Parainfluenza Virus 1 NOT DETECTED NOT DETECTED Final   Parainfluenza Virus 2 NOT DETECTED NOT DETECTED Final   Parainfluenza Virus 3 NOT DETECTED NOT DETECTED Final   Parainfluenza Virus 4 NOT DETECTED NOT DETECTED Final   Respiratory Syncytial Virus NOT DETECTED NOT DETECTED Final   Bordetella pertussis NOT DETECTED NOT DETECTED Final   Chlamydophila pneumoniae NOT DETECTED NOT DETECTED Final   Mycoplasma pneumoniae NOT DETECTED NOT DETECTED Final    Comment: Performed at [facility], [place] [address] [place], [place] [address]  MRSA PCR Screening     Status: None   Collection Time: 09/17/19  8:15 PM   Specimen: Nasal Mucosa; Nasopharyngeal  Result Value Ref Range Status   MRSA by PCR NEGATIVE NEGATIVE Final    Comment:        The GeneXpert MRSA Assay (FDA approved for NASAL specimens only), is one component of a comprehensive MRSA colonization surveillance program. It is not intended to diagnose MRSA infection nor to guide or monitor treatment for MRSA infections. Performed at [facility], [place] [address] [place], [place] [address]      Scheduled Meds: . Chlorhexidine Gluconate Cloth  6 each Topical Daily  . dexamethasone  4 mg Oral Daily  . insulin aspart  0-15 Units Subcutaneous Q4H  . insulin glargine  20 Units Subcutaneous Daily  . mouth rinse  15 mL Mouth Rinse BID  . metoprolol tartrate  5 mg Intravenous Q8H  . rivaroxaban  20 mg Oral Q supper  . thiamine injection  100 mg Intravenous Daily    LOS: 8 days   Bonnielee Haff 09/25/2019  [facility] Office  [phone] Pager - Text Page per Shea Evans  If 7PM-7AM, please contact night-coverage per Amion 09/25/2019, 10:55 AM

## 2019-09-25 NOTE — Progress Notes (Signed)
  Speech Language Pathology Treatment: Dysphagia  Patient Details Name: Maxwell Miller MRN: [id number] DOB: 1961/03/30 Today's Date: 09/25/2019 Time: 6067-7034 SLP Time Calculation (min) (ACUTE ONLY): 15 min  Assessment / Plan / Recommendation Clinical Impression  Pt demonstrates improved oral phase with puree texture then in prior session. No oral holding observed, greater quantity of intake, though total assist feeding provided. Max attempts to cue initiation were only successful in getting pt to drink from a cup, but no self feed with utensils. He did make two verbal requests and thanked me for the help. Recommend pt continue current diet while admitted and will need assist with feeding due to mentation, not ability to hold his utensils. Will sign off at this time.   HPI HPI: Pt is a 58 yo male admitted from SNF with fever and AMS, positive for COVID-19. PMH: dementia, ICH      SLP Plan  All goals met       Recommendations  Diet recommendations: Dysphagia 1 (puree);Thin liquid Liquids provided via: Cup;Straw Medication Administration: Whole meds with puree Supervision: Staff to assist with self feeding Compensations: Slow rate;Small sips/bites;Minimize environmental distractions Postural Changes and/or Swallow Maneuvers: Seated upright 90 degrees                Oral Care Recommendations: Oral care BID Follow up Recommendations: Skilled Nursing facility SLP Visit Diagnosis: Dysphagia, unspecified (R13.10) Plan: All goals met       GO               Herbie Baltimore, MA CCC-SLP  Acute Rehabilitation Services Pager [phone] Office [phone]  Lynann Beaver 09/25/2019, 2:03 PM

## 2019-09-26 DIAGNOSIS — D751 Secondary polycythemia: Secondary | ICD-10-CM | POA: Diagnosis not present

## 2019-09-26 DIAGNOSIS — U071 COVID-19: Secondary | ICD-10-CM | POA: Diagnosis not present

## 2019-09-26 DIAGNOSIS — I81 Portal vein thrombosis: Secondary | ICD-10-CM | POA: Diagnosis not present

## 2019-09-26 DIAGNOSIS — G9341 Metabolic encephalopathy: Secondary | ICD-10-CM | POA: Diagnosis not present

## 2019-09-26 LAB — GLUCOSE, CAPILLARY
Glucose-Capillary: 111 mg/dL — ABNORMAL HIGH (ref 70–99)
Glucose-Capillary: 138 mg/dL — ABNORMAL HIGH (ref 70–99)
Glucose-Capillary: 244 mg/dL — ABNORMAL HIGH (ref 70–99)
Glucose-Capillary: 252 mg/dL — ABNORMAL HIGH (ref 70–99)
Glucose-Capillary: 333 mg/dL — ABNORMAL HIGH (ref 70–99)
Glucose-Capillary: 389 mg/dL — ABNORMAL HIGH (ref 70–99)
Glucose-Capillary: 91 mg/dL (ref 70–99)

## 2019-09-26 LAB — BASIC METABOLIC PANEL
Anion gap: 13 (ref 5–15)
BUN: 20 mg/dL (ref 6–20)
CO2: 20 mmol/L — ABNORMAL LOW (ref 22–32)
Calcium: 8.5 mg/dL — ABNORMAL LOW (ref 8.9–10.3)
Chloride: 107 mmol/L (ref 98–111)
Creatinine, Ser: 1.18 mg/dL (ref 0.61–1.24)
GFR calc Af Amer: >60 mL/min (ref >60)
GFR calc non Af Amer: >60 mL/min (ref >60)
Glucose, Bld: 53 mg/dL — ABNORMAL LOW (ref 70–99)
Potassium: 5 mmol/L (ref 3.5–5.1)
Sodium: 140 mmol/L (ref 135–145)

## 2019-09-26 MED ORDER — INSULIN GLARGINE 100 UNIT/ML ~~LOC~~ SOLN
15.0000 [IU] | Freq: Every day | SUBCUTANEOUS | Status: DC
  Administered 2019-09-27 – 2019-09-28 (×2): 15 [IU] via SUBCUTANEOUS
  Filled 2019-09-26 (×2): qty 0.15

## 2019-09-26 NOTE — NC FL2 (Signed)
[place] LEVEL OF CARE SCREENING TOOL     IDENTIFICATION  Patient Name: Maxwell Miller Birthdate: November 09, 1961 Sex: male Admission Date (Current Location): 09/17/2019  [facility] [place] Number:  Herbalist and Address:  [facility], [place] [address] [place], [place] [address]([facility])      Provider Number: [id number]  Attending Physician Name and Address:  Bonnielee Haff, MD  Relative Name and Phone Number:  Coreon Simkins [phone]    Current Level of Care: Hospital Recommended Level of Care: [place] [facility] Prior Approval Number:    Date Approved/Denied:   PASRR Number: [id number]  Discharge Plan: SNF    Current Diagnoses: Patient Active Problem List   Diagnosis Date Noted  . Pneumonia due to COVID-19 virus 09/17/2019  . Hypertension   . Vascular dementia without behavioral disturbance ([place])   . Portal vein thrombosis   . AKI (acute kidney injury) ([place])   . Dehydration   . Respiratory failure with hypoxia ([place])   . Acute metabolic encephalopathy   . Malnutrition of moderate degree 01/30/2019  . Sepsis ([place]) 01/20/2019    Orientation RESPIRATION BLADDER Height & Weight     (disoriented x4)  Normal Incontinent, External catheter(placed 11/9) Weight: 216 lb 14.9 oz (98.4 kg) Height:  5\' 11"  (180.3 cm)  BEHAVIORAL SYMPTOMS/MOOD NEUROLOGICAL BOWEL NUTRITION STATUS      Incontinent Diet(NPO)  AMBULATORY STATUS COMMUNICATION OF NEEDS Skin   Total Care Does not communicate Skin abrasions(MASD [place])                       Personal Care Assistance Level of Assistance  Total care       Total Care Assistance: Maximum assistance   Functional Limitations Info  Sight, Hearing, Speech(UTA) Sight Info: Adequate Hearing Info: Adequate Speech Info: Impaired    SPECIAL CARE FACTORS FREQUENCY  PT (By licensed PT), OT (By licensed OT)     PT Frequency: 5x OT Frequency: 5x             Contractures Contractures Info: Not present    Additional Factors Info  Code Status, Isolation Precautions, Insulin Sliding Scale Code Status Info: Partial     Insulin Sliding Scale Info: Insulin Aspart (Novolog) 0- 15 units every 4 hours, Insulin glargine (Lantus) 18 units per day. Isolation Precautions Info: Covid positive     Current Medications (09/26/2019):  This is the current hospital active medication list Current Facility-Administered Medications  Medication Dose Route Frequency Provider Last Rate Last Dose  . acetaminophen (TYLENOL) suppository 650 mg  650 mg Rectal Q6H PRN Cherene Altes, MD   650 mg at 09/19/19 1038  . Chlorhexidine Gluconate Cloth 2 % PADS 6 each  6 each Topical Daily Regalado, Belkys A, MD   6 each at 09/24/19 1040  . dexamethasone (DECADRON) tablet 4 mg  4 mg Oral Daily Bonnielee Haff, MD   4 mg at 09/25/19 6295  . famotidine (PEPCID) IVPB 20 mg premix  20 mg Intravenous Q12H Regalado, Belkys A, MD 100 mL/hr at 09/25/19 2200 20 mg at 09/25/19 2200  . insulin aspart (novoLOG) injection 0-15 Units  0-15 Units Subcutaneous Q4H Cherene Altes, MD   2 Units at 09/26/19 0840  . [START ON 09/27/2019] insulin glargine (LANTUS) injection 15 Units  15 Units Subcutaneous Daily Bonnielee Haff, MD      . labetalol (NORMODYNE) injection 5 mg  5 mg Intravenous Q6H PRN Regalado, Belkys A,  MD   5 mg at 09/17/19 1700  . MEDLINE mouth rinse  15 mL Mouth Rinse BID Regalado, Belkys A, MD   15 mL at 09/25/19 2204  . metoprolol tartrate (LOPRESSOR) injection 5 mg  5 mg Intravenous Q8H Tarry Kos A, MD   5 mg at 09/26/19 0558  . ondansetron (ZOFRAN) tablet 4 mg  4 mg Oral Q6H PRN Regalado, Belkys A, MD       Or  . ondansetron (ZOFRAN) injection 4 mg  4 mg Intravenous Q6H PRN Regalado, Belkys A, MD      . rivaroxaban (XARELTO) tablet 20 mg  20 mg Oral Q supper Sampson Si, RPH   20 mg at 09/25/19 1844  . thiamine (B-1) injection 100 mg  100 mg  Intravenous Daily Regalado, Belkys A, MD   100 mg at 09/25/19 1761     Discharge Medications: Please see discharge summary for a list of discharge medications.  Relevant Imaging Results:  Relevant Lab Results:   Additional Information SSN# [id number]  Rudie Sermons A Desyre Calma, LCSW

## 2019-09-26 NOTE — Progress Notes (Signed)
Maxwell Miller  XBJ:[id number] DOB: 1961-08-04 DOA: 09/17/2019 PCP: Mable ParisWilliams, Katrina, PA-C    Brief Narrative:  58 [facility] old [facility] resident with a history of vascular dementia, nontraumatic intracerebral hemorrhage, portal vein thrombosis on chronic Xarelto, and a diagnosis of COVID-19 10/28 who presented to the ED with 24 hours of severely elevated blood sugars, fever to 105, and altered mental status.  The patient is reportedly conversant at baseline.  Significant Events: [facility] 11/6 transfer to progressive care unit  COVID-19 specific Treatment: Remdesivir 11/5 > Decadron 11/5 >  Subjective: Patient noted to be sleepy this morning.  He is moving around in the bed but does not want to talk to me.  Opened his eyes very briefly.     Assessment & Plan:  Pneumonia due to COVID-19  Lab Results  Component Value Date   SARSCOV2NAA POSITIVE (A) 09/17/2019   SARSCOV2NAA NOT DETECTED 03/30/2019   SARSCOV2NAA NOT DETECTED 03/06/2019    Patient presented with high fever with a temperature of 105.4 F.  He was encephalopathic.  He has completed course of remdesivir.  Steroid is being tapered down.  Respiratory status remains stable.  Saturating normal on room air.  Inflammatory markers had improved.     SIRS/lactic acidosis Due to the above.  Now resolved.  Acute metabolic encephalopathy No acute findings on CT head.  This was thought to be mainly in the setting of acute COVID-19 infection, electrolyte derangement and hypernatremia.  Patient also was dehydrated.  He did have elevated ammonia level initially but this has normalized without intervention.  B12 and folate levels were normal.  Mental status has been slowly getting better.  He did talk a little bit yesterday.  Today again he is not very communicative.  I think this is going to take a long time to get better.    Hyperammonemia Normalized without intervention.  History of  portal vein thrombosis Chronically on rivaroxaban.  Was on Lovenox here due to poor oral intake.  Now back on rivaroxaban.  Acute kidney injury This was due to volume depletion and dehydration.  Resolved with IV fluids.    Polycythemia This appears to be chronic.  Hypernatremia Secondary to dehydration.  Resolved with the D5 water.  Dysphagia Speech therapy has been following.  He is on a dysphagia 1 diet.  He will need continued therapy when he is at a skilled nursing facility.  Diabetes mellitus type 2, uncontrolled with hyperglycemia, new diagnosis This appears to be a new diagnosis for him.  HbA1c 12.8.  CBG has been improving as steroid is tapered down.  We will cut back on dose of Lantus.    Hypokalemia Potassium has been repleted  DVT prophylaxis: On rivaroxaban. Code Status: DO NOT INTUBATE  Family communication: Discussed with niece 2 days ago.  Could not reach her yesterday.  We will try her again today. Disposition Plan: Will need SNF placement, niece wants different SNF facility, social worker consulted.  PT and OT.  Consultants:  PCCM, signed off  Antimicrobials:  Anti-infectives (From admission, onward)   Start     Dose/Rate Route Frequency Ordered Stop   09/18/19 1000  remdesivir 100 mg in sodium chloride 0.9 % 250 mL IVPB     100 mg 500 mL/hr over 30 Minutes Intravenous Every 24 hours 09/17/19 1117 09/21/19 1055   09/17/19 1200  remdesivir 200 mg in sodium chloride 0.9 % 250 mL IVPB     200  mg 500 mL/hr over 30 Minutes Intravenous Once 09/17/19 1117 09/17/19 1249   09/17/19 0745  ceFEPIme (MAXIPIME) 2 g in sodium chloride 0.9 % 100 mL IVPB     2 g 200 mL/hr over 30 Minutes Intravenous  Once 09/17/19 0734 09/17/19 0837   09/17/19 0745  vancomycin (VANCOCIN) 1,750 mg in sodium chloride 0.9 % 500 mL IVPB     1,750 mg 250 mL/hr over 120 Minutes Intravenous  Once 09/17/19 0734 09/17/19 1104       Objective: Blood pressure 127/89, pulse 72, temperature 98.3  F (36.8 C), temperature source Oral, resp. rate 13, height  (1.803 m), weight 98.4 kg, SpO2 96 %. No intake or output data in the 24 hours ending 09/26/19 1019 Filed Weights   09/17/19 0822 09/17/19 2200  Weight: (S) 81.6 kg 98.4 kg    Examination:  General appearance: Somnolent this morning.  Does open his eyes but goes right back to sleep.  Does not talk much. Resp: Clear to auscultation bilaterally.  Normal effort Cardio: S1-S2 is normal regular.  No S3-S4.  No rubs murmurs or bruit GI: Abdomen is soft.  Nontender nondistended.  Bowel sounds are present normal.  No masses organomegaly Extremities: No edema.  Noted to be moving all his extremities Neurologic: No obvious focal deficits noted.    CBC: Recent Labs  Lab 09/20/19 0409 09/21/19 0605 09/22/19 1050  WBC 7.2 6.3 5.8  NEUTROABS 4.9 3.4 2.6  HGB 17.9* 17.7* 19.0*  HCT 58.4* 56.4* 58.0*  MCV 91.8 90.7 87.6  PLT 134* 102* 119*   Basic Metabolic Panel: Recent Labs  Lab 09/20/19 0409 09/21/19 0605 09/22/19 1050 09/24/19 0420 09/25/19 0325 09/26/19 0147  NA 156* 149* 139 143 141 140  K 4.3 3.0* 3.0* 3.8 3.5 5.0  CL 120* 113* 105 106 104 107  CO2 20*  GLUCOSE 199* 202* 149* 54* 102* 53*  BUN 33* 25* 20 22* 20 20  CREATININE 1.33* 1.19 0.98 1.17 0.99 1.18  CALCIUM 8.5* 8.3* 8.2* 8.9 8.7* 8.5*  MG 2.3 2.1 2.0 2.3  --   --   PHOS 2.6 2.2* 2.6  --   --   --    GFR: Estimated Creatinine Clearance: 82.6 mL/min (by C-G formula based on SCr of 1.18 mg/dL).  Liver Function Tests: Recent Labs  Lab 09/20/19 0409 09/21/19 0605 09/22/19 1050  AST 35 45* 54*  ALT 22 24 35  ALKPHOS 51 52 56  BILITOT 1.0 1.3* 1.4*  PROT 6.9 7.0 7.3  ALBUMIN 2.7* 2.8* 3.0*     HbA1C: Hgb A1c MFr Bld  Date/Time Value Ref Range Status  09/17/2019 12:42 PM 12.8 (H) 4.8 - 5.6 % Final    Comment:    (NOTE) Pre diabetes:          5.7%-6.4% Diabetes:              >6.4% Glycemic control for   <7.0% adults  with diabetes     CBG: Recent Labs  Lab 09/25/19 1539 09/25/19 2031 09/26/19 0008 09/26/19 0410 09/26/19 0736  GLUCAP 328* 208* 91 111* 138*    Recent Results (from the past 240 hour(s))  Blood Culture (routine x 2)     Status: None   Collection Time: 09/17/19  7:54 AM   Specimen: BLOOD  Result Value Ref Range Status   Specimen Description   Final    BLOOD RIGHT ANTECUBITAL Performed at [facility], [address] [place], [place] [address]    Special Requests   Final    BOTTLES DRAWN AEROBIC AND ANAEROBIC Blood Culture adequate volume Performed at [facility], [address] [place], [place] [address]    Culture   Final    NO GROWTH 5 DAYS Performed at [facility], [address] [place], [place] [address]    Report Status 09/22/2019 FINAL  Final  Urine culture     Status: None   Collection Time: 09/17/19  7:54 AM   Specimen: In/Out Cath Urine  Result Value Ref Range Status   Specimen Description   Final    IN/OUT CATH URINE Performed at [facility], [address] [place], [place] [address]    Special Requests   Final    NONE Performed at [facility], [address], [place], [place] [address]    Culture   Final    NO GROWTH Performed at [facility], [address], [place], [place] [address]    Report Status 09/18/2019 FINAL  Final  Blood Culture (routine x 2)     Status: None   Collection Time: 09/17/19  7:57 AM   Specimen: BLOOD  Result Value Ref Range Status   Specimen Description   Final    BLOOD LEFT ANTECUBITAL Performed at [facility], [address], [place], [place] [address]    Special Requests   Final    BOTTLES DRAWN AEROBIC AND ANAEROBIC Blood Culture adequate volume Performed at [facility], [address] [place], [place] [address]    Culture   Final    NO GROWTH 5 DAYS Performed at [facility], [address] [place], [place] [address]    Report Status 09/22/2019 FINAL  Final  SARS CORONAVIRUS 2 (TAT 6-24 HRS) Nasopharyngeal Nasopharyngeal Swab     Status: Abnormal   Collection Time: 09/17/19 10:22 AM   Specimen: Nasopharyngeal Swab  Result Value Ref Range Status   SARS Coronavirus 2 POSITIVE (A) NEGATIVE Final    Comment: RESULT CALLED TO, READ BACK BY AND VERIFIED WITH: S.WEST,RN [id number] I.MANNING (NOTE) SARS-CoV-2 target nucleic acids are DETECTED. The SARS-CoV-2 RNA is generally detectable in upper and lower respiratory specimens during the acute phase of infection. Positive results are indicative of active infection with SARS-CoV-2. Clinical  correlation with patient history and other diagnostic information is necessary to determine patient infection status. Positive results do  not rule out bacterial infection or co-infection with other viruses. The expected result is Negative. Fact Sheet for Patients: HairSlick.no Fact Sheet for Healthcare Providers: quierodirigir.com This test is not yet approved or cleared by the [place] FDA and  has been authorized for detection and/or diagnosis of SARS-CoV-2 by FDA under an Emergency Use Authorization (EUA). This EUA will remain  in effect (meaning this test can be used) for the  duration of the COVID-19 declaration under Section 564(b)(1) of the Act, 21 U.S.C. section 360bbb-3(b)(1), unless the authorization is terminated or revoked sooner. Performed at [facility], [address] [place], [place] [address]   Respiratory Panel by PCR     Status: None   Collection Time: 09/17/19 11:30 AM  Result Value Ref Range Status   Adenovirus NOT DETECTED NOT DETECTED Final   Coronavirus 229E NOT DETECTED NOT DETECTED Final    Comment: (NOTE) The Coronavirus on the Respiratory Panel, DOES NOT test for the novel  Coronavirus (  2019 nCoV)    Coronavirus  HKU1 NOT DETECTED NOT DETECTED Final   Coronavirus NL63 NOT DETECTED NOT DETECTED Final   Coronavirus OC43 NOT DETECTED NOT DETECTED Final   Metapneumovirus NOT DETECTED NOT DETECTED Final   Rhinovirus / Enterovirus NOT DETECTED NOT DETECTED Final   Influenza A NOT DETECTED NOT DETECTED Final   Influenza B NOT DETECTED NOT DETECTED Final   Parainfluenza Virus 1 NOT DETECTED NOT DETECTED Final   Parainfluenza Virus 2 NOT DETECTED NOT DETECTED Final   Parainfluenza Virus 3 NOT DETECTED NOT DETECTED Final   Parainfluenza Virus 4 NOT DETECTED NOT DETECTED Final   Respiratory Syncytial Virus NOT DETECTED NOT DETECTED Final   Bordetella pertussis NOT DETECTED NOT DETECTED Final   Chlamydophila pneumoniae NOT DETECTED NOT DETECTED Final   Mycoplasma pneumoniae NOT DETECTED NOT DETECTED Final    Comment: Performed at [facility], [place] [address] [place], [place] [address]  MRSA PCR Screening     Status: None   Collection Time: 09/17/19  8:15 PM   Specimen: Nasal Mucosa; Nasopharyngeal  Result Value Ref Range Status   MRSA by PCR NEGATIVE NEGATIVE Final    Comment:        The GeneXpert MRSA Assay (FDA approved for NASAL specimens only), is one component of a comprehensive MRSA colonization surveillance program. It is not intended to diagnose MRSA infection nor to guide or monitor treatment for MRSA infections. Performed at [facility], [place] [address], [place], [place] [address]      Scheduled Meds: . Chlorhexidine Gluconate Cloth  6 each Topical Daily  . dexamethasone  4 mg Oral Daily  . insulin aspart  0-15 Units Subcutaneous Q4H  . insulin glargine  20 Units Subcutaneous Daily  . mouth rinse  15 mL Mouth Rinse BID  . metoprolol tartrate  5 mg Intravenous Q8H  . rivaroxaban  20 mg Oral Q supper  . thiamine injection  100 mg Intravenous Daily    LOS: 9 days   Bonnielee Haff 09/26/2019  [facility] Office  [phone] Pager - Text Page per  Shea Evans  If 7PM-7AM, please contact night-coverage per Amion 09/26/2019, 10:19 AM

## 2019-09-26 NOTE — Progress Notes (Signed)
Occupational Therapy Evaluation Patient Details Name: Maxwell Miller MRN: [id number] DOB: 1961/05/22 Today's Date: 09/26/2019    History of Present Illness 58 y/o w/ hx of vascular dementia, CVA, portal vein thrombosis, non traumatic intracerebral hemorrhage, HLD, HTN, COVID 19   Clinical Impression   Resident of SNF, requires A for ADL and mobility at baseline due to cognitive deficits. Able to mobilize to recliner taking a few steps to look out the window on RA with VSS Will benefit from acutelyOT services to maximize functional level of indepednence to facilitate DC to SNF,     Follow Up Recommendations  SNF;Supervision/Assistance - 24 hour    Equipment Recommendations  None recommended by OT    Recommendations for Other Services       Precautions / Restrictions Precautions Precautions: Fall      Mobility Bed Mobility Overal bed mobility: Needs Assistance Bed Mobility: Supine to Sit     Supine to sit: Min assist        Transfers Overall transfer level: Needs assistance Equipment used: Rolling walker (2 wheeled) Transfers: Sit to/from [facility] Sit to Stand: Min assist Stand pivot transfers: Min assist            Balance Overall balance assessment: Needs assistance   Sitting balance-Leahy Scale: Good       Standing balance-Leahy Scale: Fair                             ADL either performed or assessed with clinical judgement   ADL Overall ADL's : Needs assistance/impaired Eating/Feeding: Moderate assistance Eating/Feeding Details (indicate cue type and reason): Able to bring cup to his mouth and drink. will further assess Grooming: Wash/dry face;Supervision/safety;Set up;Sitting   Upper Body Bathing: Moderate assistance;Sitting   Lower Body Bathing: Moderate assistance;Sit to/from stand           Toilet Transfer: Minimal assistance   Toileting- Clothing Manipulation and Hygiene: Total assistance Toileting -  Clothing Manipulation Details (indicate cue type and reason): incontinent     Functional mobility during ADLs: Minimal assistance;Rolling walker       Vision         Perception     Praxis Praxis Praxis tested?: Deficits Deficits: Initiation    Pertinent Vitals/Pain Pain Assessment: Faces Faces Pain Scale: No hurt     Hand Dominance Right   Extremity/Trunk Assessment Upper Extremity Assessment Upper Extremity Assessment: Generalized weakness   Lower Extremity Assessment Lower Extremity Assessment: Defer to PT evaluation   Cervical / Trunk Assessment Cervical / Trunk Assessment: Normal   Communication Communication Communication: Expressive difficulties(slow with communication)   Cognition Arousal/Alertness: Awake/alert Behavior During Therapy: Flat affect Overall Cognitive Status: No family/caregiver present to determine baseline cognitive functioning                                 General Comments: delayed resonse but answered some questions; appropirate with OT   General Comments       Exercises     Shoulder Instructions      Home Living Family/patient expects to be discharged to:: Skilled nursing facility                                        Prior Functioning/Environment Level of Independence: Needs assistance  Gait /  Transfers Assistance Needed: unsure, states used walker  ADL's / Homemaking Assistance Needed: needed assist            OT Problem List: Decreased strength;Decreased activity tolerance;Impaired balance (sitting and/or standing);Decreased cognition;Cardiopulmonary status limiting activity      OT Treatment/Interventions: Self-care/ADL training;Therapeutic exercise;Neuromuscular education;Therapeutic activities;Cognitive remediation/compensation;Patient/family education;Balance training    OT Goals(Current goals can be found in the care plan section) Acute Rehab OT Goals Patient Stated Goal: Pt did  not state OT Goal Formulation: Patient unable to participate in goal setting Time For Goal Achievement: 10/10/19 Potential to Achieve Goals: Good  OT Frequency: Min 2X/week   Barriers to D/C:            Co-evaluation              AM-PAC OT "6 Clicks" Daily Activity     Outcome Measure Help from another person eating meals?: A Lot Help from another person taking care of personal grooming?: A Lot Help from another person toileting, which includes using toliet, bedpan, or urinal?: A Lot Help from another person bathing (including washing, rinsing, drying)?: A Lot Help from another person to put on and taking off regular upper body clothing?: A Lot Help from another person to put on and taking off regular lower body clothing?: A Lot 6 Click Score: 12   End of Session Equipment Utilized During Treatment: Rolling walker  Activity Tolerance: Patient tolerated treatment well Patient left: in chair;with call bell/phone within reach;with chair alarm set  OT Visit Diagnosis: Unsteadiness on feet (R26.81);Other abnormalities of gait and mobility (R26.89);Muscle weakness (generalized) (M62.81);Other symptoms and signs involving cognitive function                Time: 1135-1208 OT Time Calculation (min): 33 min Charges:  OT General Charges $OT Visit: 1 Visit OT Evaluation $OT Eval Moderate Complexity: 1 Mod OT Treatments $Self Care/Home Management : 8-22 mins  Luisa Dago, OT/L   Acute OT Clinical Specialist Acute Rehabilitation Services Pager [phone] Office [phone]   [facility] 09/26/2019, 2:17 PM

## 2019-09-27 DIAGNOSIS — I81 Portal vein thrombosis: Secondary | ICD-10-CM | POA: Diagnosis not present

## 2019-09-27 DIAGNOSIS — D751 Secondary polycythemia: Secondary | ICD-10-CM | POA: Diagnosis not present

## 2019-09-27 DIAGNOSIS — U071 COVID-19: Secondary | ICD-10-CM | POA: Diagnosis not present

## 2019-09-27 DIAGNOSIS — G9341 Metabolic encephalopathy: Secondary | ICD-10-CM | POA: Diagnosis not present

## 2019-09-27 LAB — GLUCOSE, CAPILLARY
Glucose-Capillary: 102 mg/dL — ABNORMAL HIGH (ref 70–99)
Glucose-Capillary: 117 mg/dL — ABNORMAL HIGH (ref 70–99)
Glucose-Capillary: 138 mg/dL — ABNORMAL HIGH (ref 70–99)
Glucose-Capillary: 185 mg/dL — ABNORMAL HIGH (ref 70–99)
Glucose-Capillary: 200 mg/dL — ABNORMAL HIGH (ref 70–99)
Glucose-Capillary: 242 mg/dL — ABNORMAL HIGH (ref 70–99)
Glucose-Capillary: 313 mg/dL — ABNORMAL HIGH (ref 70–99)

## 2019-09-27 LAB — BASIC METABOLIC PANEL
Anion gap: 13 (ref 5–15)
BUN: 21 mg/dL — ABNORMAL HIGH (ref 6–20)
CO2: 25 mmol/L (ref 22–32)
Calcium: 9.2 mg/dL (ref 8.9–10.3)
Chloride: 106 mmol/L (ref 98–111)
Creatinine, Ser: 1.07 mg/dL (ref 0.61–1.24)
GFR calc Af Amer: >60 mL/min (ref >60)
GFR calc non Af Amer: >60 mL/min (ref >60)
Glucose, Bld: 71 mg/dL (ref 70–99)
Potassium: 4.6 mmol/L (ref 3.5–5.1)
Sodium: 144 mmol/L (ref 135–145)

## 2019-09-27 MED ORDER — ADULT MULTIVITAMIN W/MINERALS CH
1.0000 | ORAL_TABLET | Freq: Every day | ORAL | Status: DC
  Administered 2019-09-28 – 2019-10-18 (×21): 1 via ORAL
  Filled 2019-09-27 (×21): qty 1

## 2019-09-27 MED ORDER — ACETAMINOPHEN 325 MG PO TABS: 650.0000 mg | ORAL_TABLET | Freq: Four times a day (QID) | ORAL | Status: DC | PRN

## 2019-09-27 MED ORDER — METOPROLOL TARTRATE 25 MG PO TABS
25.0000 mg | ORAL_TABLET | Freq: Two times a day (BID) | ORAL | Status: DC
  Administered 2019-09-27 – 2019-10-12 (×30): 25 mg via ORAL
  Filled 2019-09-27 (×29): qty 1

## 2019-09-27 MED ORDER — FAMOTIDINE 20 MG PO TABS
20.0000 mg | ORAL_TABLET | Freq: Every day | ORAL | Status: DC
  Administered 2019-09-28 – 2019-10-18 (×20): 20 mg via ORAL
  Filled 2019-09-27 (×20): qty 1

## 2019-09-27 MED ORDER — VITAMIN B-1 100 MG PO TABS
100.0000 mg | ORAL_TABLET | Freq: Every day | ORAL | Status: DC
  Administered 2019-09-28 – 2019-10-18 (×21): 100 mg via ORAL
  Filled 2019-09-27 (×19): qty 1

## 2019-09-27 NOTE — Plan of Care (Signed)
  Problem: Education: Goal: Knowledge of General Education information will improve Description: Including pain rating scale, medication(s)/side effects and non-pharmacologic comfort measures 09/27/2019 1049 by Herma Ard, RN Outcome: Not Progressing   Problem: Health Behavior/Discharge Planning: Goal: Ability to manage health-related needs will improve 09/27/2019 1049 by Herma Ard, RN Outcome: Not Progressing   Problem: Clinical Measurements: Goal: Ability to maintain clinical measurements within normal limits will improve 09/27/2019 1049 by Herma Ard, RN Outcome: Not Progressing   Problem: Clinical Measurements: Goal: Will remain free from infection 09/27/2019 1049 by Herma Ard, RN Outcome: Not Progressing   Problem: Clinical Measurements: Goal: Diagnostic test results will improve 09/27/2019 1049 by Herma Ard, RN Outcome: Not Progressing   Problem: Clinical Measurements: Goal: Respiratory complications will improve 09/27/2019 1049 by Herma Ard, RN Outcome: Not Progressing   Problem: Clinical Measurements: Goal: Cardiovascular complication will be avoided 09/27/2019 1049 by Herma Ard, RN Outcome: Not Progressing   Problem: Activity: Goal: Risk for activity intolerance will decrease 09/27/2019 1049 by Herma Ard, RN Outcome: Not Progressing   Problem: Nutrition: Goal: Adequate nutrition will be maintained 09/27/2019 1049 by Herma Ard, RN Outcome: Not Progressing   Problem: Coping: Goal: Level of anxiety will decrease 09/27/2019 1049 by Herma Ard, RN Outcome: Not Progressing   Problem: Pain Managment: Goal: General experience of comfort will improve 09/27/2019 1049 by Herma Ard, RN Outcome: Not Progressing   Problem: Safety: Goal: Ability to remain free from injury will improve 09/27/2019 1049 by Herma Ard, RN Outcome: Not Progressing   Problem: Skin Integrity: Goal: Risk  for impaired skin integrity will decrease 09/27/2019 1049 by Herma Ard, RN Outcome: Not Progressing   Problem: Education: Goal: Knowledge of risk factors and measures for prevention of condition will improve 09/27/2019 1049 by Herma Ard, RN Outcome: Not Progressing   Problem: Respiratory: Goal: Complications related to the disease process, condition or treatment will be avoided or minimized 09/27/2019 1049 by Herma Ard, RN Outcome: Not Progressing

## 2019-09-27 NOTE — Progress Notes (Signed)
Maxwell Miller  GXQ:[id number] DOB: 1961-04-07 DOA: 09/17/2019 PCP: Ancil Boozer, PA-C    Brief Narrative:  58 year old Maxwell Miller [facility] resident with a history of vascular dementia, nontraumatic intracerebral hemorrhage, portal vein thrombosis on chronic Xarelto, and a diagnosis of COVID-19 10/28 who presented to the ED with 24 hours of severely elevated blood sugars, fever to 105, and altered mental status.  The patient is reportedly conversant at baseline.  Significant Events: 11/5 admit to [facility] ICU via [facility] long ED 11/6 transfer to progressive care unit  COVID-19 specific Treatment: Remdesivir 11/5 > Decadron 11/5 >  Subjective: Patient noted to be sleepy again this morning.  He is noted to be moving all his extremities.  Opens his eyes very transiently.  Does not really talk.  Assessment & Plan:  Pneumonia due to COVID-19  Lab Results  Component Value Date   [place] (A) 09/17/2019   [place] NOT DETECTED 03/30/2019   [place] NOT DETECTED 03/06/2019    Patient presented with high fever with a temperature of 105.4 F.  He was encephalopathic.  He has completed course of remdesivir.  Steroid is being tapered down.   Patient's respiratory status remains stable.  He is saturating normal on room air.    SIRS/lactic acidosis Due to the above.  Now resolved.  Acute metabolic encephalopathy No acute findings on CT head.  This was thought to be mainly in the setting of acute COVID-19 infection, electrolyte derangement and hypernatremia.  Patient also was dehydrated and so he was given IV fluids.  He did have elevated ammonia level initially but this has normalized without intervention.  B12 and folate levels were normal.   His mental status has been very slow to improve.  I think he is going to take a very long time to get better.  Continue to work with him.    Hyperammonemia Normalized without intervention.  History of portal vein  thrombosis Chronically on rivaroxaban.  Was on Lovenox here due to poor oral intake.  Now back on rivaroxaban.  Acute kidney injury This was due to volume depletion and dehydration.  Resolved with IV fluids.    Polycythemia This appears to be chronic.  Hypernatremia Secondary to dehydration.  Resolved with the D5 water.  Dysphagia Speech therapy has been following.  He is on a dysphagia 1 diet.  He will need continued therapy when he is at a skilled nursing facility.  Oral intake has been poor.  Hopefully with improvement in physical activity his appetite will improve.  This will need to be monitored at the skilled nursing facility.  Diabetes mellitus type 2, uncontrolled with hyperglycemia, new diagnosis This appears to be a new diagnosis for him.  HbA1c 12.8.  CBG has been improving as steroid is tapered down.  Lantus dose was decreased.  Monitor CBGs.    Hypokalemia Potassium has been repleted  DVT prophylaxis: On rivaroxaban. Code Status: DO NOT INTUBATE  Family communication: Discussed with neice yesterday.  Disposition Plan: Plan is for placement to skilled nursing facility.  Social worker is following.  PT and OT.  Should be okay from a medical standpoint to go to skilled nursing facility in the next 24 to 48 hours.    Consultants:  PCCM, signed off  Antimicrobials:  Anti-infectives (From admission, onward)   Start     Dose/Rate Route Frequency Ordered Stop   09/18/19 1000  remdesivir 100 mg in sodium chloride 0.9 % 250 mL IVPB     100 mg  500 mL/hr over 30 Minutes Intravenous Every 24 hours 09/17/19 1117 09/21/19 1055   09/17/19 1200  remdesivir 200 mg in sodium chloride 0.9 % 250 mL IVPB     200 mg 500 mL/hr over 30 Minutes Intravenous Once 09/17/19 1117 09/17/19 1249   09/17/19 0745  ceFEPIme (MAXIPIME) 2 g in sodium chloride 0.9 % 100 mL IVPB     2 g 200 mL/hr over 30 Minutes Intravenous  Once 09/17/19 0734 09/17/19 0837   09/17/19 0745  vancomycin (VANCOCIN) 1,750  mg in sodium chloride 0.9 % 500 mL IVPB     1,750 mg 250 mL/hr over 120 Minutes Intravenous  Once 09/17/19 0734 09/17/19 1104       Objective: Blood pressure (!) 135/99, pulse 91, temperature 98.3 F (36.8 C), temperature source Oral, resp. rate 18, height 5\' 11"  (1.803 m), weight 98.4 kg, SpO2 97 %.  Intake/Output Summary (Last 24 hours) at 09/27/2019 1030 Last data filed at 09/27/2019 0904 Gross per 24 hour  Intake 120 ml  Output -  Net 120 ml   Filed Weights   09/17/19 0822 09/17/19 2200  Weight: (S) 81.6 kg 98.4 kg    Examination:  General appearance: Somnolent.  Opens his eyes briefly.  Does not really talk. Resp: Clear to auscultation bilaterally.  Normal effort Cardio: S1-S2 is normal regular.  No S3-S4.  No rubs murmurs or bruit GI: Abdomen is soft.  Nontender nondistended.  Bowel sounds are present normal.  No masses organomegaly Extremities: No edema.  Moving all his extremities. Neurologic: Not very communicative.  No obvious facial asymmetry.  No obvious focal neurological deficits.    CBC: Recent Labs  Lab 09/21/19 0605 09/22/19 1050  WBC 6.3 5.8  NEUTROABS 3.4 2.6  HGB 17.7* 19.0*  HCT 56.4* 58.0*  MCV 90.7 87.6  PLT 102* 119*   Basic Metabolic Panel: Recent Labs  Lab 09/21/19 0605 09/22/19 1050 09/24/19 0420 09/25/19 0325 09/26/19 0147 09/27/19 0350  NA 149* 139 143 141 140 144  K 3.0* 3.0* 3.8 3.5 5.0 4.6  CL 113* 105 106 104 107 106  CO2 28 24 27 27  20* 25  GLUCOSE 202* 149* 54* 102* 53* 71  BUN 25* 20 22* 20 20 21*  CREATININE 1.19 0.98 1.17 0.99 1.18 1.07  CALCIUM 8.3* 8.2* 8.9 8.7* 8.5* 9.2  MG 2.1 2.0 2.3  --   --   --   PHOS 2.2* 2.6  --   --   --   --    GFR: Estimated Creatinine Clearance: 91 mL/min (by C-G formula based on SCr of 1.07 mg/dL).  Liver Function Tests: Recent Labs  Lab 09/21/19 0605 09/22/19 1050  AST 45* 54*  ALT 24 35  ALKPHOS 52 56  BILITOT 1.3* 1.4*  PROT 7.0 7.3  ALBUMIN 2.8* 3.0*     HbA1C:  Hgb A1c MFr Bld  Date/Time Value Ref Range Status  09/17/2019 12:42 PM 12.8 (H) 4.8 - 5.6 % Final    Comment:    (NOTE) Pre diabetes:          5.7%-6.4% Diabetes:              >6.4% Glycemic control for   <7.0% adults with diabetes     CBG: Recent Labs  Lab 09/26/19 1648 09/26/19 2035 09/26/19 2346 09/27/19 0402 09/27/19 0737  GLUCAP 333* 252* 138* 102* 117*    Recent Results (from the past 240 hour(s))  Respiratory Panel by PCR     Status:  None   Collection Time: 09/17/19 11:30 AM  Result Value Ref Range Status   Adenovirus NOT DETECTED NOT DETECTED Final   Coronavirus 229E NOT DETECTED NOT DETECTED Final    Comment: (NOTE) The Coronavirus on the Respiratory Panel, DOES NOT test for the novel  Coronavirus (2019 nCoV)    Coronavirus HKU1 NOT DETECTED NOT DETECTED Final   Coronavirus NL63 NOT DETECTED NOT DETECTED Final   Coronavirus OC43 NOT DETECTED NOT DETECTED Final   Metapneumovirus NOT DETECTED NOT DETECTED Final   Rhinovirus / Enterovirus NOT DETECTED NOT DETECTED Final   Influenza A NOT DETECTED NOT DETECTED Final   Influenza B NOT DETECTED NOT DETECTED Final   Parainfluenza Virus 1 NOT DETECTED NOT DETECTED Final   Parainfluenza Virus 2 NOT DETECTED NOT DETECTED Final   Parainfluenza Virus 3 NOT DETECTED NOT DETECTED Final   Parainfluenza Virus 4 NOT DETECTED NOT DETECTED Final   Respiratory Syncytial Virus NOT DETECTED NOT DETECTED Final   Bordetella pertussis NOT DETECTED NOT DETECTED Final   Chlamydophila pneumoniae NOT DETECTED NOT DETECTED Final   Mycoplasma pneumoniae NOT DETECTED NOT DETECTED Final    Comment: Performed at [facility], [address] [place], [place] [address]  MRSA PCR Screening     Status: None   Collection Time: 09/17/19  8:15 PM   Specimen: Nasal Mucosa; Nasopharyngeal  Result Value Ref Range Status   MRSA by PCR NEGATIVE NEGATIVE Final    Comment:        The GeneXpert MRSA Assay (FDA approved for NASAL specimens  only), is one component of a comprehensive MRSA colonization surveillance program. It is not intended to diagnose MRSA infection nor to guide or monitor treatment for MRSA infections. Performed at [facility], [address] [place], [place] [address]      Scheduled Meds: . Chlorhexidine Gluconate Cloth  6 each Topical Daily  . dexamethasone  4 mg Oral Daily  . insulin aspart  0-15 Units Subcutaneous Q4H  . insulin glargine  15 Units Subcutaneous Daily  . mouth rinse  15 mL Mouth Rinse BID  . metoprolol tartrate  5 mg Intravenous Q8H  . rivaroxaban  20 mg Oral Q supper  . thiamine injection  100 mg Intravenous Daily    LOS: 10 days   Osvaldo ShipperGokul Kecia Swoboda 09/27/2019  [facility] Office  [phone] Pager - Text Page per Loretha StaplerAmion  If 7PM-7AM, please contact night-coverage per Amion 09/27/2019, 10:30 AM

## 2019-09-28 LAB — GLUCOSE, CAPILLARY
Glucose-Capillary: 65 mg/dL — ABNORMAL LOW (ref 70–99)
Glucose-Capillary: 108 mg/dL — ABNORMAL HIGH (ref 70–99)
Glucose-Capillary: 169 mg/dL — ABNORMAL HIGH (ref 70–99)
Glucose-Capillary: 174 mg/dL — ABNORMAL HIGH (ref 70–99)
Glucose-Capillary: 104 mg/dL — ABNORMAL HIGH (ref 70–99)

## 2019-09-28 LAB — BASIC METABOLIC PANEL
Anion gap: 10 (ref 5–15)
BUN: 21 mg/dL — ABNORMAL HIGH (ref 6–20)
CO2: 26 mmol/L (ref 22–32)
Calcium: 9.1 mg/dL (ref 8.9–10.3)
Chloride: 105 mmol/L (ref 98–111)
Creatinine, Ser: 1.2 mg/dL (ref 0.61–1.24)
GFR calc Af Amer: >60 mL/min (ref >60)
GFR calc non Af Amer: >60 mL/min (ref >60)
Glucose, Bld: 80 mg/dL (ref 70–99)
Potassium: 3.7 mmol/L (ref 3.5–5.1)
Sodium: 141 mmol/L (ref 135–145)

## 2019-09-28 MED ORDER — ADULT MULTIVITAMIN W/MINERALS CH: 1.0000 | ORAL_TABLET | Freq: Every day | ORAL | Status: AC

## 2019-09-28 MED ORDER — AMLODIPINE BESYLATE 5 MG PO TABS: 5.0000 mg | ORAL_TABLET | Freq: Every day | ORAL | Status: AC

## 2019-09-28 MED ORDER — INSULIN GLARGINE 100 UNIT/ML ~~LOC~~ SOLN
10.0000 [IU] | Freq: Every day | SUBCUTANEOUS | Status: DC
  Administered 2019-09-29 – 2019-10-18 (×20): 10 [IU] via SUBCUTANEOUS
  Filled 2019-09-28 (×20): qty 0.1

## 2019-09-28 MED ORDER — INSULIN GLARGINE 100 UNIT/ML ~~LOC~~ SOLN: 10.0000 [IU] | Freq: Every day | SUBCUTANEOUS | 11 refills | Status: DC

## 2019-09-28 NOTE — Discharge Summary (Signed)
[facility]  Physician Discharge Summary   Patient ID: Maxwell Miller MRN: [id number] DOB/AGE: 58/31/1962 58 y.o.  Admit date: 09/17/2019 Discharge date: 09/28/2019  PCP: Ancil Boozer, PA-C  DISCHARGE DIAGNOSES:  Pneumonia due to [id number] Acute metabolic encephalopathy History of portal vein thrombosis on anticoagulation Diarrhea, resolved Dysphagia Diabetes mellitus type 2 uncontrolled with hyperglycemia   RECOMMENDATIONS FOR OUTPATIENT FOLLOW UP: 1. Speech therapy to follow at skilled nursing facility 2. Palliative medicine consult at skilled nursing facility 3. Monitor CBGs and adjust Lantus dose accordingly.   Home Health: None Equipment/Devices: None  CODE STATUS: Limited resuscitation: See MOST form  DISCHARGE CONDITION: fair  Diet recommendation: Dysphagia 1 diet with thin liquids  INITIAL HISTORY: 58 year old Maxwell Miller SNF resident with a history of vascular dementia, nontraumatic intracerebral hemorrhage, portal vein thrombosis on chronic Xarelto, and a diagnosis of COVID-19 10/28 who presented to the ED with 24 hours of severely elevated blood sugars, fever to 105, and altered mental status.  The patient is reportedly conversant at baseline.  Significant Events: 11/5 admit to [facility] ICU via [facility] long ED 11/6 transfer to progressive care unit  COVID-19 specific Treatment: Remdesivir 11/5 > Decadron 11/5 >   HOSPITAL COURSE:   Pneumonia due to COVID-19 Patient presented with high fever with a temperature of 105.4 F.  He was encephalopathic.  He has completed course of remdesivir.  Steroids were tapered down.    His respiratory status remained stable.  He is saturating normal on room air.  SIRS/lactic acidosis Due to the above.  Now resolved.  Acute metabolic encephalopathy No acute findings on CT head.  This was thought to be mainly in the setting of acute COVID-19 infection, electrolyte derangement and hypernatremia.   Patient also was dehydrated and so he was given IV fluids.  He did have elevated ammonia level initially but this has normalized without intervention.  B12 and folate levels were normal.   His mental status has been very slow to improve.  He is going to take a very long time to get better.    Hyperammonemia Normalized without intervention.  History of portal vein thrombosis Chronically on rivaroxaban.  Was on Lovenox here due to poor oral intake.  Now back on rivaroxaban.  Acute kidney injury This was due to volume depletion and dehydration.  Resolved with IV fluids.    Polycythemia This appears to be chronic.  Hypernatremia Secondary to dehydration.  Resolved with the D5 water.  Dysphagia Speech therapy has been following.  He is on a dysphagia 1 diet.  He will need continued therapy when he is at a skilled nursing facility.  Oral intake has been poor.  Hopefully with improvement in physical activity his appetite will improve.  This will need to be monitored at the skilled nursing facility.  Diabetes mellitus type 2, uncontrolled with hyperglycemia, new diagnosis This appears to be a new diagnosis for him.  HbA1c 12.8.  CBG has been improving as steroid is tapered down.  Lantus dose was decreased.    Hypoglycemia noted this morning.  We will recheck CBG before discharge.  Hypokalemia Potassium has been repleted  Obesity Estimated body mass index is 30.26 kg/m as calculated from the following:   Height as of this encounter: 5\' 11"  (1.803 m).   Weight as of this encounter: 98.4 kg.  Overall stable.  Okay for discharge to skilled nursing facility.   PERTINENT LABS:  The results of significant diagnostics from this hospitalization (including imaging, microbiology, ancillary and laboratory)  are listed below for reference.      Labs:  COVID-19 Labs   Lab Results  Component Value Date   SARSCOV2NAA POSITIVE (A) 09/17/2019   SARSCOV2NAA NOT DETECTED 03/30/2019    SARSCOV2NAA NOT DETECTED 03/06/2019      Basic Metabolic Panel: Recent Labs  Lab 09/22/19 1050 09/24/19 0420 09/25/19 0325 09/26/19 0147 09/27/19 0350 09/28/19 0530  NA 139 143 141 140 144 141  K 3.0* 3.8 3.5 5.0 4.6 3.7  CL 105 106 104 107 106 105  CO2 20* 25 26  GLUCOSE 149* 54* 102* 53* 71 80  BUN 20 22* 20 20 21* 21*  CREATININE 0.98 1.17 0.99 1.18 1.07 1.20  CALCIUM 8.2* 8.9 8.7* 8.5* 9.2 9.1  MG 2.0 2.3  --   --   --   --   PHOS 2.6  --   --   --   --   --    Liver Function Tests: Recent Labs  Lab 09/22/19 1050  AST 54*  ALT 35  ALKPHOS 56  BILITOT 1.4*  PROT 7.3  ALBUMIN 3.0*   CBC: Recent Labs  Lab 09/22/19 1050  WBC 5.8  NEUTROABS 2.6  HGB 19.0*  HCT 58.0*  MCV 87.6  PLT 119*    CBG: Recent Labs  Lab 09/27/19 1159 09/27/19 1637 09/27/19 2013 09/27/19 2345 09/28/19 0348  GLUCAP 200* 185* 313* 242* 65*     IMAGING STUDIES Ct Head Wo Contrast  Result Date: 09/17/2019 CLINICAL DATA:  58 year old male with altered mental status. EXAM: CT HEAD WITHOUT CONTRAST TECHNIQUE: Contiguous axial images were obtained from the base of the skull through the vertex without intravenous contrast. COMPARISON:  Head CT dated 04/01/2019 FINDINGS: Brain: Mild age-related atrophy and moderate chronic microvascular ischemic changes. Probable small old right basal ganglia lacunar infarct. There is no acute intracranial hemorrhage. No mass effect or midline shift. No extra-axial fluid collection. Vascular: No hyperdense vessel or unexpected calcification. Skull: Normal. Negative for fracture or focal lesion. Sinuses/Orbits: No acute finding. Other: None IMPRESSION: 1. No acute intracranial hemorrhage. 2. Age-related atrophy and chronic microvascular ischemic changes. Electronically Signed   By: Elgie Collard M.D.   On: 09/17/2019 14:32   Dg Chest Port 1 View  Result Date: 09/17/2019 CLINICAL DATA:  58 year old male found unresponsive. Fever and  hyperglycemia. EXAM: PORTABLE CHEST 1 VIEW COMPARISON:  Portable chest 01/20/2019 and earlier. FINDINGS: Portable AP semi upright view at 0821 hours. Lower lung volumes. Patchy and indistinct basilar predominant pulmonary opacity greater on the left. Low lung volumes accentuate mediastinal contours which are probably stable. Visualized tracheal air column is within normal limits. No pneumothorax or pleural effusion. Paucity of bowel gas in the upper abdomen. No acute osseous abnormality identified. IMPRESSION: Lower lung volumes with indistinct basilar predominant bilateral pulmonary opacity greater on the left. Consider Acute Viral/atypical respiratory infection. Asymmetric pulmonary edema felt less likely. Electronically Signed   By: Odessa Fleming M.D.   On: 09/17/2019 08:53    DISCHARGE EXAMINATION: Vitals:   09/27/19 1637 09/27/19 1921 09/28/19 0352 09/28/19 0759  BP: 113/72 (!) 141/88 134/84 136/90  Pulse: 80 87 76 72  Resp: (!) 21 (!) Temp: 98.3 F (36.8 C) 98.5 F (36.9 C) 98.2 F (36.8 C) 98.7 F (37.1 C)  TempSrc: Oral Oral Oral Oral  SpO2: 95% 97% 96% 98%  Weight:      Height:       General appearance: Awake alert.  In  no distress.  Not very communicative Resp: Clear to auscultation bilaterally.  Normal effort Cardio: S1-S2 is normal regular.  No S3-S4.  No rubs murmurs or bruit GI: Abdomen is soft.  Nontender nondistended.  Bowel sounds are present normal.  No masses organomegaly    DISPOSITION: SNF  Discharge Instructions    Call MD for:  difficulty breathing, headache or visual disturbances   Complete by: As directed    Call MD for:  extreme fatigue   Complete by: As directed    Call MD for:  persistant dizziness or light-headedness   Complete by: As directed    Call MD for:  persistant nausea and vomiting   Complete by: As directed    Call MD for:  severe uncontrolled pain   Complete by: As directed    Discharge instructions   Complete by: As directed     Please review instructions on the discharge summary.  COVID 19 INSTRUCTIONS  - You are felt to be stable enough to no longer require inpatient monitoring, testing, and treatment, though you will need to follow the recommendations below: - Based on the CDC's non-test criteria for ending self-isolation: You may not return to work/leave the home until at least 21 days since symptom onset AND 3 days without a fever (without taking tylenol, ibuprofen, etc.) AND have improvement in respiratory symptoms. - Do not take NSAID medications (including, but not limited to, ibuprofen, advil, motrin, naproxen, aleve, goody's powder, etc.) - Follow up with your doctor in the next week via telehealth or seek medical attention right away if your symptoms get WORSE.  - Consider donating plasma after you have recovered (either 14 days after a negative test or 28 days after symptoms have completely resolved) because your antibodies to this virus may be helpful to give to others with life-threatening infections. Please go to the website www.[email or web address] if you would like to consider volunteering for plasma donation.    Directions for you at home:  Wear a facemask You should wear a facemask that covers your nose and mouth when you are in the same room with other people and when you visit a healthcare provider. People who live with or visit you should also wear a facemask while they are in the same room with you.  Separate yourself from other people in your home As much as possible, you should stay in a different room from other people in your home. Also, you should use a separate bathroom, if available.  Avoid sharing household items You should not share dishes, drinking glasses, cups, eating utensils, towels, bedding, or other items with other people in your home. After using these items, you should wash them thoroughly with soap and water.  Cover your coughs and sneezes Cover your mouth and nose with a tissue  when you cough or sneeze, or you can cough or sneeze into your sleeve. Throw used tissues in a lined trash can, and immediately wash your hands with soap and water for at least 20 seconds or use an alcohol-based hand rub.  Wash your [employer]hands Wash your hands often and thoroughly with soap and water for at least 20 seconds. You can use an alcohol-based hand sanitizer if soap and water are not available and if your hands are not visibly dirty. Avoid touching your eyes, nose, and mouth with unwashed hands.  Directions for those who live with, or provide care at home for you:  Limit the number of people who have contact with the patient  If possible, have only one caregiver for the patient. Other household members should stay in another home or place of residence. If this is not possible, they should stay in another room, or be separated from the patient as much as possible. Use a separate bathroom, if available. Restrict visitors who do not have an essential need to be in the home.  Ensure good ventilation Make sure that shared spaces in the home have good air flow, such as from an air conditioner or an opened window, weather permitting.  Wash your hands often Wash your hands often and thoroughly with soap and water for at least 20 seconds. You can use an alcohol based hand sanitizer if soap and water are not available and if your hands are not visibly dirty. Avoid touching your eyes, nose, and mouth with unwashed hands. Use disposable paper towels to dry your hands. If not available, use dedicated cloth towels and replace them when they become wet.  Wear a facemask and gloves Wear a disposable facemask at all times in the room and gloves when you touch or have contact with the patient's blood, body fluids, and/or secretions or excretions, such as sweat, saliva, sputum, nasal mucus, vomit, urine, or feces.  Ensure the mask fits over your nose and mouth tightly, and do not touch it during use.  Throw out disposable facemasks and gloves after using them. Do not reuse. Wash your hands immediately after removing your facemask and gloves. If your personal clothing becomes contaminated, carefully remove clothing and launder. Wash your hands after handling contaminated clothing. Place all used disposable facemasks, gloves, and other waste in a lined container before disposing them with other household waste. Remove gloves and wash your hands immediately after handling these items.  Do not share dishes, glasses, or other household items with the patient Avoid sharing household items. You should not share dishes, drinking glasses, cups, eating utensils, towels, bedding, or other items with a patient who is confirmed to have, or being evaluated for, COVID-19 infection. After the person uses these items, you should wash them thoroughly with soap and water.  Wash laundry thoroughly Immediately remove and wash clothes or bedding that have blood, body fluids, and/or secretions or excretions, such as sweat, saliva, sputum, nasal mucus, vomit, urine, or feces, on them. Wear gloves when handling laundry from the patient. Read and follow directions on labels of laundry or clothing items and detergent. In general, wash and dry with the warmest temperatures recommended on the label.  Clean all areas the individual has used often Clean all touchable surfaces, such as counters, tabletops, doorknobs, bathroom fixtures, toilets, phones, keyboards, tablets, and bedside tables, every day. Also, clean any surfaces that may have blood, body fluids, and/or secretions or excretions on them. Wear gloves when cleaning surfaces the patient has come in contact with. Use a diluted bleach solution (e.g., dilute bleach with 1 part bleach and 10 parts water) or a household disinfectant with a label that says EPA-registered for coronaviruses. To make a bleach solution at home, add 1 tablespoon of bleach to 1 quart (4 cups)  of water. For a larger supply, add  cup of bleach to 1 gallon (16 cups) of water. Read labels of cleaning products and follow recommendations provided on product labels. Labels contain instructions for safe and effective use of the cleaning product including precautions you should take when applying the product, such as wearing gloves or eye protection and making sure you have good ventilation during use of the  product. Remove gloves and wash hands immediately after cleaning.  Monitor yourself for signs and symptoms of illness Caregivers and household members are considered close contacts, should monitor their health, and will be asked to limit movement outside of the home to the extent possible. Follow the monitoring steps for close contacts listed on the symptom monitoring form.   If you have additional questions, contact your local health department or call the epidemiologist on call at [phone] (available 24/7). This guidance is subject to change. For the most up-to-date guidance from [facility], please refer to their website: [email or web address].[email or web address]   You were cared for by a hospitalist during your hospital stay. If you have any questions about your discharge medications or the care you received while you were in the hospital after you are discharged, you can call the unit and asked to speak with the hospitalist on call if the hospitalist that took care of you is not available. Once you are discharged, your primary care physician will handle any further medical issues. Please note that NO REFILLS for any discharge medications will be authorized once you are discharged, as it is imperative that you return to your primary care physician (or establish a relationship with a primary care physician if you do not have one) for your aftercare needs so that they can reassess your need for medications and monitor your lab values. If you do not have a primary  care physician, you can call [phone] for a physician referral.   Increase activity slowly   Complete by: As directed         Allergies as of 09/28/2019   No Known Allergies     Medication List    STOP taking these medications   clonazePAM 1 MG tablet Commonly known as: KLONOPIN   mirtazapine 15 MG disintegrating tablet Commonly known as: REMERON SOL-TAB   OXYGEN     TAKE these medications   acetaminophen 325 MG tablet Commonly known as: TYLENOL Take 650 mg by mouth every 6 (six) hours as needed for mild pain or fever.   amLODipine 5 MG tablet Commonly known as: NORVASC Take 1 tablet (5 mg total) by mouth daily. What changed:   medication strength  how much to take   atorvastatin 20 MG tablet Commonly known as: LIPITOR Take 20 mg by mouth at bedtime.   cholecalciferol 25 MCG (1000 UT) tablet Commonly known as: VITAMIN D3 Take 1,000 Units by mouth daily.   cloNIDine 0.1 MG tablet Commonly known as: CATAPRES Take 0.1 mg by mouth every 8 (eight) hours as needed. SBP above 160 or DBP above 100   docusate sodium 100 MG capsule Commonly known as: COLACE Take 100 mg by mouth daily.   insulin glargine 100 UNIT/ML injection Commonly known as: LANTUS Inject 0.1 mLs (10 Units total) into the skin daily. Start taking on: September 29, 2019   metoprolol tartrate 25 MG tablet Commonly known as: LOPRESSOR Take 1 tablet (25 mg total) by mouth 2 (two) times daily.   multivitamin with minerals Tabs tablet Take 1 tablet by mouth daily. Start taking on: September 29, 2019   tamsulosin 0.4 MG Caps capsule Commonly known as: FLOMAX Take 1 capsule (0.4 mg total) by mouth daily. What changed: when to take this   venlafaxine XR 150 MG 24 hr capsule Commonly known as: EFFEXOR-XR Take 150 mg by mouth daily with breakfast.   Xarelto 20 MG Tabs tablet Generic drug: rivaroxaban Take 20 mg by mouth daily with supper.  Follow-up Information    [facility] [place], [place]. Schedule an appointment as soon as possible for a visit in 1 week(s).   Specialty: Family Medicine Contact information: [address] [place] [address] [phone]           TOTAL DISCHARGE TIME: 35 minutes  Tiffanny Lamarche Rito Ehrlich  [facility] Pager on [email or web address]  09/28/2019, 10:10 AM

## 2019-09-28 NOTE — Plan of Care (Signed)
  Problem: Education: Goal: Knowledge of General Education information will improve Description: Including pain rating scale, medication(s)/side effects and non-pharmacologic comfort measures Outcome: Not Progressing   Problem: Health Behavior/Discharge Planning: Goal: Ability to manage health-related needs will improve Outcome: Not Progressing   Problem: Clinical Measurements: Goal: Ability to maintain clinical measurements within normal limits will improve Outcome: Not Progressing Goal: Will remain free from infection Outcome: Not Progressing Goal: Diagnostic test results will improve Outcome: Not Progressing Goal: Respiratory complications will improve Outcome: Not Progressing Goal: Cardiovascular complication will be avoided Outcome: Not Progressing   Problem: Activity: Goal: Risk for activity intolerance will decrease Outcome: Not Progressing   Problem: Nutrition: Goal: Adequate nutrition will be maintained Outcome: Not Progressing   Problem: Coping: Goal: Level of anxiety will decrease Outcome: Not Progressing   Problem: Pain Managment: Goal: General experience of comfort will improve Outcome: Not Progressing   Problem: Safety: Goal: Ability to remain free from injury will improve Outcome: Not Progressing   Problem: Skin Integrity: Goal: Risk for impaired skin integrity will decrease Outcome: Not Progressing   Problem: Education: Goal: Knowledge of risk factors and measures for prevention of condition will improve Outcome: Not Progressing   Problem: Respiratory: Goal: Complications related to the disease process, condition or treatment will be avoided or minimized Outcome: Not Progressing

## 2019-09-28 NOTE — Progress Notes (Signed)
CSW aware of DC summary for patient today- patients daughter is not wanting him to return to [place] at this time. Daughter agreeable to [facility] in [place]. Spoke with Estill Bamberg with facility and she will start insurance authorization ASAP if they are able to accept patient.   Kingsley Spittle, LCSW [facility]  [phone]

## 2019-09-29 LAB — GLUCOSE, CAPILLARY
Glucose-Capillary: 205 mg/dL — ABNORMAL HIGH (ref 70–99)
Glucose-Capillary: 125 mg/dL — ABNORMAL HIGH (ref 70–99)
Glucose-Capillary: 101 mg/dL — ABNORMAL HIGH (ref 70–99)
Glucose-Capillary: 269 mg/dL — ABNORMAL HIGH (ref 70–99)
Glucose-Capillary: 196 mg/dL — ABNORMAL HIGH (ref 70–99)

## 2019-09-29 NOTE — Progress Notes (Signed)
Pt niece updated, all questions answered.

## 2019-09-29 NOTE — Discharge Summary (Signed)
[facility]  Physician Discharge Summary   Patient ID: Maxwell Miller MRN: [id number] DOB/AGE: [birth year] 58 y.o.  Admit date: 09/17/2019 Discharge date: 09/29/2019  PCP: Mable Paris, PA-C  DISCHARGE DIAGNOSES:  Pneumonia due to COVID-19 Acute metabolic encephalopathy History of portal vein thrombosis on anticoagulation Diarrhea, resolved Dysphagia Diabetes mellitus type 2 uncontrolled with hyperglycemia   RECOMMENDATIONS FOR OUTPATIENT FOLLOW UP: 1. Speech therapy to follow at skilled nursing facility 2. Palliative medicine consult at skilled nursing facility 3. Monitor CBGs and adjust Lantus dose accordingly.   Home Health: None Equipment/Devices: None  CODE STATUS: Limited resuscitation: See MOST form  DISCHARGE CONDITION: fair  Diet recommendation: Dysphagia 1 diet with thin liquids  INITIAL HISTORY: 58 year old [facility] resident with a history of vascular dementia, nontraumatic intracerebral hemorrhage, portal vein thrombosis on chronic Xarelto, and a diagnosis of COVID-19 10/28 who presented to the ED with 24 hours of severely elevated blood sugars, fever to 105, and altered mental status.  The patient is reportedly conversant at baseline.  Significant Events: 11/5 admit to [facility] ICU via [facility] long ED 11/6 transfer to progressive care unit  COVID-19 specific Treatment: Remdesivir 11/5 > Decadron 11/5 >   HOSPITAL COURSE:   Pneumonia due to COVID-19 Patient presented with high fever with a temperature of 105.4 F.  He was encephalopathic.  He has completed course of remdesivir.  Steroids were tapered down.    His respiratory status remained stable.  He is saturating normal on room air.  Remained stable from a respiratory standpoint.  SIRS/lactic acidosis Due to the above.  Now resolved.  Acute metabolic encephalopathy No acute findings on CT head.  This was thought to be mainly in the setting of acute COVID-19  infection, electrolyte derangement and hypernatremia.  Patient also was dehydrated and so he was given IV fluids.  He did have elevated ammonia level initially but this has normalized without intervention.  B12 and folate levels were normal.   His mental status has been very slow to improve.  He is going to take a very long time to get better. But he has been making progress over the last 2 to 3 days.  Hyperammonemia Normalized without intervention.  History of portal vein thrombosis Chronically on rivaroxaban.  Was on Lovenox here due to poor oral intake.  Now back on rivaroxaban.  Acute kidney injury This was due to volume depletion and dehydration.  Resolved with IV fluids.    Polycythemia This appears to be chronic.  Hypernatremia Secondary to dehydration.  Resolved with the D5 water.  Dysphagia Speech therapy has been following.  He is on a dysphagia 1 diet.  He will need continued therapy when he is at a skilled nursing facility.  Oral intake has been poor.  Hopefully with improvement in physical activity his appetite will improve.  This will need to be monitored at the skilled nursing facility.  Diabetes mellitus type 2, uncontrolled with hyperglycemia, new diagnosis This appears to be a new diagnosis for him.  HbA1c 12.8.  CBG has been improving as steroid is tapered down.  Lantus dose was decreased.    Hypoglycemia noted this morning.  We will recheck CBG before discharge.  Hypokalemia Potassium has been repleted  Obesity Estimated body mass index is 30.26 kg/m as calculated from the following:   Height as of this encounter: 5\' 11"  (1.803 m).   Weight as of this encounter: 98.4 kg.  Patient remains stable.  Remains stable for discharge to skilled nursing  facility.  Waiting on insurance authorization.   PERTINENT LABS:  The results of significant diagnostics from this hospitalization (including imaging, microbiology, ancillary and laboratory) are listed below for  reference.      Labs:  COVID-19 Labs   Lab Results  Component Value Date   SARSCOV2NAA POSITIVE (A) 09/17/2019   SARSCOV2NAA NOT DETECTED 03/30/2019   SARSCOV2NAA NOT DETECTED 03/06/2019      Basic Metabolic Panel: Recent Labs  Lab 09/24/19 0420 09/25/19 0325 09/26/19 0147 09/27/19 0350 09/28/19 0530  NA 143 141 140 144 141  K 3.8 3.5 5.0 4.6 3.7  CL 106 104 107 106 105  CO2 27 27 20* 25 26  GLUCOSE 54* 102* 53* 71 80  BUN 22* 20 20 21* 21*  CREATININE 1.17 0.99 1.18 1.07 1.20  CALCIUM 8.9 8.7* 8.5* 9.2 9.1  MG 2.3  --   --   --   --     CBG: Recent Labs  Lab 09/28/19 1624 09/28/19 1930 09/28/19 2324 09/29/19 0351 09/29/19 0755  GLUCAP 174* 205* 104* 125* 101*     IMAGING STUDIES Ct Head Wo Contrast  Result Date: 09/17/2019 CLINICAL DATA:  58 year old male with altered mental status. EXAM: CT HEAD WITHOUT CONTRAST TECHNIQUE: Contiguous axial images were obtained from the base of the skull through the vertex without intravenous contrast. COMPARISON:  Head CT dated 04/01/2019 FINDINGS: Brain: Mild age-related atrophy and moderate chronic microvascular ischemic changes. Probable small old right basal ganglia lacunar infarct. There is no acute intracranial hemorrhage. No mass effect or midline shift. No extra-axial fluid collection. Vascular: No hyperdense vessel or unexpected calcification. Skull: Normal. Negative for fracture or focal lesion. Sinuses/Orbits: No acute finding. Other: None IMPRESSION: 1. No acute intracranial hemorrhage. 2. Age-related atrophy and chronic microvascular ischemic changes. Electronically Signed   By: Elgie Collard M.D.   On: 09/17/2019 14:32   Dg Chest Port 1 View  Result Date: 09/17/2019 CLINICAL DATA:  58 year old male found unresponsive. Fever and hyperglycemia. EXAM: PORTABLE CHEST 1 VIEW COMPARISON:  Portable chest 01/20/2019 and earlier. FINDINGS: Portable AP semi upright view at 0821 hours. Lower lung volumes. Patchy and  indistinct basilar predominant pulmonary opacity greater on the left. Low lung volumes accentuate mediastinal contours which are probably stable. Visualized tracheal air column is within normal limits. No pneumothorax or pleural effusion. Paucity of bowel gas in the upper abdomen. No acute osseous abnormality identified. IMPRESSION: Lower lung volumes with indistinct basilar predominant bilateral pulmonary opacity greater on the left. Consider Acute Viral/atypical respiratory infection. Asymmetric pulmonary edema felt less likely. Electronically Signed   By: Odessa Fleming M.D.   On: 09/17/2019 08:53    DISCHARGE EXAMINATION: Vitals:   09/28/19 2000 09/29/19 0000 09/29/19 0400 09/29/19 0800  BP: 117/79 127/83 129/85 130/89  Pulse:  75 67 83  Resp: Temp:  98.1 F (36.7 C) 98.3 F (36.8 C) (!) 97 F (36.1 C)  TempSrc: Axillary Axillary Axillary Oral  SpO2: 99% 94% 99% 98%  Weight:      Height:       General appearance: Awake alert.  In no distress.  Distracted Resp: Clear to auscultation bilaterally.  Normal effort Cardio: S1-S2 is normal regular.  No S3-S4.  No rubs murmurs or bruit GI: Abdomen is soft.  Nontender nondistended.  Bowel sounds are present normal.  No masses organomegaly   DISPOSITION: SNF  Discharge Instructions    Call MD for:  difficulty breathing, headache or visual disturbances  Complete by: As directed    Call MD for:  extreme fatigue   Complete by: As directed    Call MD for:  persistant dizziness or light-headedness   Complete by: As directed    Call MD for:  persistant nausea and vomiting   Complete by: As directed    Call MD for:  severe uncontrolled pain   Complete by: As directed    Discharge instructions   Complete by: As directed    Please review instructions on the discharge summary.  COVID 19 INSTRUCTIONS  - You are felt to be stable enough to no longer require inpatient monitoring, testing, and treatment, though you will need to follow  the recommendations below: - Based on the CDC's non-test criteria for ending self-isolation: You may not return to work/leave the home until at least 21 days since symptom onset AND 3 days without a fever (without taking tylenol, ibuprofen, etc.) AND have improvement in respiratory symptoms. - Do not take NSAID medications (including, but not limited to, ibuprofen, advil, motrin, naproxen, aleve, goody's powder, etc.) - Follow up with your doctor in the next week via telehealth or seek medical attention right away if your symptoms get WORSE.  - Consider donating plasma after you have recovered (either 14 days after a negative test or 28 days after symptoms have completely resolved) because your antibodies to this virus may be helpful to give to others with life-threatening infections. Please go to the website [email or web address] if you would like to consider volunteering for plasma donation.    Directions for you at home:  Wear a facemask You should wear a facemask that covers your nose and mouth when you are in the same room with other people and when you visit a healthcare provider. People who live with or visit you should also wear a facemask while they are in the same room with you.  Separate yourself from other people in your home As much as possible, you should stay in a different room from other people in your home. Also, you should use a separate bathroom, if available.  Avoid sharing household items You should not share dishes, drinking glasses, cups, eating utensils, towels, bedding, or other items with other people in your home. After using these items, you should wash them thoroughly with soap and water.  Cover your coughs and sneezes Cover your mouth and nose with a tissue when you cough or sneeze, or you can cough or sneeze into your sleeve. Throw used tissues in a lined trash can, and immediately wash your hands with soap and water for at least 20 seconds or use an alcohol-based  hand rub.  Wash your [facility] your hands often and thoroughly with soap and water for at least 20 seconds. You can use an alcohol-based hand sanitizer if soap and water are not available and if your hands are not visibly dirty. Avoid touching your eyes, nose, and mouth with unwashed hands.  Directions for those who live with, or provide care at home for you:  Limit the number of people who have contact with the patient If possible, have only one caregiver for the patient. Other household members should stay in another home or place of residence. If this is not possible, they should stay in another room, or be separated from the patient as much as possible. Use a separate bathroom, if available. Restrict visitors who do not have an essential need to be in the home.  Ensure good ventilation Make sure  that shared spaces in the home have good air flow, such as from an air conditioner or an opened window, weather permitting.  Wash your hands often Wash your hands often and thoroughly with soap and water for at least 20 seconds. You can use an alcohol based hand sanitizer if soap and water are not available and if your hands are not visibly dirty. Avoid touching your eyes, nose, and mouth with unwashed hands. Use disposable paper towels to dry your hands. If not available, use dedicated cloth towels and replace them when they become wet.  Wear a facemask and gloves Wear a disposable facemask at all times in the room and gloves when you touch or have contact with the patient's blood, body fluids, and/or secretions or excretions, such as sweat, saliva, sputum, nasal mucus, vomit, urine, or feces.  Ensure the mask fits over your nose and mouth tightly, and do not touch it during use. Throw out disposable facemasks and gloves after using them. Do not reuse. Wash your hands immediately after removing your facemask and gloves. If your personal clothing becomes contaminated, carefully remove  clothing and launder. Wash your hands after handling contaminated clothing. Place all used disposable facemasks, gloves, and other waste in a lined container before disposing them with other household waste. Remove gloves and wash your hands immediately after handling these items.  Do not share dishes, glasses, or other household items with the patient Avoid sharing household items. You should not share dishes, drinking glasses, cups, eating utensils, towels, bedding, or other items with a patient who is confirmed to have, or being evaluated for, COVID-19 infection. After the person uses these items, you should wash them thoroughly with soap and water.  Wash laundry thoroughly Immediately remove and wash clothes or bedding that have blood, body fluids, and/or secretions or excretions, such as sweat, saliva, sputum, nasal mucus, vomit, urine, or feces, on them. Wear gloves when handling laundry from the patient. Read and follow directions on labels of laundry or clothing items and detergent. In general, wash and dry with the warmest temperatures recommended on the label.  Clean all areas the individual has used often Clean all touchable surfaces, such as counters, tabletops, doorknobs, bathroom fixtures, toilets, phones, keyboards, tablets, and bedside tables, every day. Also, clean any surfaces that may have blood, body fluids, and/or secretions or excretions on them. Wear gloves when cleaning surfaces the patient has come in contact with. Use a diluted bleach solution (e.g., dilute bleach with 1 part bleach and 10 parts water) or a household disinfectant with a label that says EPA-registered for coronaviruses. To make a bleach solution at home, add 1 tablespoon of bleach to 1 quart (4 cups) of water. For a larger supply, add  cup of bleach to 1 gallon (16 cups) of water. Read labels of cleaning products and follow recommendations provided on product labels. Labels contain instructions for safe and  effective use of the cleaning product including precautions you should take when applying the product, such as wearing gloves or eye protection and making sure you have good ventilation during use of the product. Remove gloves and wash hands immediately after cleaning.  Monitor yourself for signs and symptoms of illness Caregivers and household members are considered close contacts, should monitor their health, and will be asked to limit movement outside of the home to the extent possible. Follow the monitoring steps for close contacts listed on the symptom monitoring form.   If you have additional questions, contact your local  health department or call the epidemiologist on call at [phone] (available 24/7). This guidance is subject to change. For the most up-to-date guidance from [facility], please refer to their website: TripMetro.huhttps://www.cdc.gov/coronavirus/2019-ncov/hcp/guidance-prevent-spread.html   You were cared for by a hospitalist during your hospital stay. If you have any questions about your discharge medications or the care you received while you were in the hospital after you are discharged, you can call the unit and asked to speak with the hospitalist on call if the hospitalist that took care of you is not available. Once you are discharged, your primary care physician will handle any further medical issues. Please note that NO REFILLS for any discharge medications will be authorized once you are discharged, as it is imperative that you return to your primary care physician (or establish a relationship with a primary care physician if you do not have one) for your aftercare needs so that they can reassess your need for medications and monitor your lab values. If you do not have a primary care physician, you can call [phone] for a physician referral.   Increase activity slowly   Complete by: As directed         Allergies as of 09/29/2019   No Known Allergies     Medication List     STOP taking these medications   clonazePAM 1 MG tablet Commonly known as: KLONOPIN   mirtazapine 15 MG disintegrating tablet Commonly known as: REMERON SOL-TAB   OXYGEN     TAKE these medications   acetaminophen 325 MG tablet Commonly known as: TYLENOL Take 650 mg by mouth every 6 (six) hours as needed for mild pain or fever.   amLODipine 5 MG tablet Commonly known as: NORVASC Take 1 tablet (5 mg total) by mouth daily. What changed:   medication strength  how much to take   atorvastatin 20 MG tablet Commonly known as: LIPITOR Take 20 mg by mouth at bedtime.   cholecalciferol 25 MCG (1000 UT) tablet Commonly known as: VITAMIN D3 Take 1,000 Units by mouth daily.   cloNIDine 0.1 MG tablet Commonly known as: CATAPRES Take 0.1 mg by mouth every 8 (eight) hours as needed. SBP above 160 or DBP above 100   docusate sodium 100 MG capsule Commonly known as: COLACE Take 100 mg by mouth daily.   insulin glargine 100 UNIT/ML injection Commonly known as: LANTUS Inject 0.1 mLs (10 Units total) into the skin daily.   metoprolol tartrate 25 MG tablet Commonly known as: LOPRESSOR Take 1 tablet (25 mg total) by mouth 2 (two) times daily.   multivitamin with minerals Tabs tablet Take 1 tablet by mouth daily.   tamsulosin 0.4 MG Caps capsule Commonly known as: FLOMAX Take 1 capsule (0.4 mg total) by mouth daily. What changed: when to take this   venlafaxine XR 150 MG 24 hr capsule Commonly known as: EFFEXOR-XR Take 150 mg by mouth daily with breakfast.   Xarelto 20 MG Tabs tablet Generic drug: rivaroxaban Take 20 mg by mouth daily with supper.        Follow-up Information    Mable ParisWilliams, Katrina, [place]-C. Schedule an appointment as soon as possible for a visit in 1 week(s).   Specialty: Family Medicine Contact information: [address] [place] [address] [phone]           TOTAL DISCHARGE TIME: 35 minutes  Yutaka Holberg Rito EhrlichKrishnan   [facility] Pager on [email or web address]  09/29/2019, 11:31 AM

## 2019-09-30 DIAGNOSIS — G9341 Metabolic encephalopathy: Secondary | ICD-10-CM | POA: Diagnosis not present

## 2019-09-30 LAB — GLUCOSE, CAPILLARY
Glucose-Capillary: 105 mg/dL — ABNORMAL HIGH (ref 70–99)
Glucose-Capillary: 117 mg/dL — ABNORMAL HIGH (ref 70–99)
Glucose-Capillary: 137 mg/dL — ABNORMAL HIGH (ref 70–99)
Glucose-Capillary: 154 mg/dL — ABNORMAL HIGH (ref 70–99)
Glucose-Capillary: 170 mg/dL — ABNORMAL HIGH (ref 70–99)
Glucose-Capillary: 189 mg/dL — ABNORMAL HIGH (ref 70–99)
Glucose-Capillary: 99 mg/dL (ref 70–99)

## 2019-09-30 NOTE — Progress Notes (Signed)
Physical Therapy Treatment Patient Details Name: Maxwell Miller MRN: [id number] DOB: [birth year] Today's Date: 09/30/2019    History of Present Illness 58 y/o w/ hx of vascular dementia, CVA, portal vein thrombosis, non traumatic intracerebral hemorrhage, HLD, HTN, COVID 19    PT Comments    Pt has made tremendous progress with mobility since last PT tx, he is also more vocal and able to answer therapist questions verbally. He states he does not know why chart states he is non verbal. Pt is at mod I with bed mob, able to get from supine to sit and back on his own, takes increased time to do so. Sit<>stand from bed with SBA and RW, able to ambulate in room approx 170ft, w/ RW and min guard assist, w/o walker approx 66ft with min guard assist also, noted 1 LOB but able to correct with min guard assist. Pt was on room air throughout and sats remained in 90s.    Follow Up Recommendations  SNF     Equipment Recommendations  None recommended by PT    Recommendations for Other Services       Precautions / Restrictions Precautions Precautions: Fall Precaution Comments: pt is verbal and able to answer multi questions Restrictions Weight Bearing Restrictions: No    Mobility  Bed Mobility Overal bed mobility: Modified Independent Bed Mobility: Supine to Sit     Supine to sit: Modified independent (Device/Increase time)        Transfers Overall transfer level: Needs assistance Equipment used: Rolling walker (2 wheeled) Transfers: Sit to/from Stand Sit to Stand: Supervision            Ambulation/Gait Ambulation/Gait assistance: Min guard Gait Distance (Feet): 120 Feet Assistive device: Rolling walker (2 wheeled) Gait Pattern/deviations: Step-through pattern     General Gait Details: ambulated in room initially with RW then able to ambulate approx 65ft w/ no AD and min guard assist on belt   Stairs             Wheelchair Mobility    Modified Rankin (Stroke  Patients Only)       Balance Overall balance assessment: Needs assistance Sitting-balance support: Feet unsupported Sitting balance-Leahy Scale: Good   Postural control: Other (comment)(none noted) Standing balance support: During functional activity;Bilateral upper extremity supported Standing balance-Leahy Scale: Fair Standing balance comment: 1 LOB noted with ambulation, did not have walker at time                            Cognition Arousal/Alertness: Awake/alert Behavior During Therapy: Flat affect Overall Cognitive Status: (seems to be getting better than at initial eval)                                 General Comments: more appropriate with PT today      Exercises      General Comments General comments (skin integrity, edema, etc.): Pt was on room air and was able to maintain sats in 90s      Pertinent Vitals/Pain Pain Assessment: No/denies pain    Home Living                      Prior Function            PT Goals (current goals can now be found in the care plan section) Acute Rehab PT Goals Time For Goal Achievement: 10/09/19  Potential to Achieve Goals: Good Progress towards PT goals: Progressing toward goals    Frequency    Min 2X/week      PT Plan Current plan remains appropriate    Co-evaluation              AM-PAC PT "6 Clicks" Mobility   Outcome Measure  Help needed turning from your back to your side while in a flat bed without using bedrails?: None Help needed moving from lying on your back to sitting on the side of a flat bed without using bedrails?: None Help needed moving to and from a bed to a chair (including a wheelchair)?: A Little Help needed standing up from a chair using your arms (e.g., wheelchair or bedside chair)?: A Little Help needed to walk in hospital room?: A Little Help needed climbing 3-5 steps with a railing? : A Lot 6 Click Score: 19    End of Session Equipment  Utilized During Treatment: Gait belt Activity Tolerance: Patient tolerated treatment well Patient left: in chair;with call bell/phone within reach;with chair alarm set Nurse Communication: Mobility status PT Visit Diagnosis: Other abnormalities of gait and mobility (R26.89);Muscle weakness (generalized) (M62.81)     Time: 1542-1600 PT Time Calculation (min) (ACUTE ONLY): 18 min  Charges:  $Gait Training: 8-22 mins                     Horald Chestnut, PT    Delford Field 09/30/2019, 4:51 PM

## 2019-09-30 NOTE — TOC Progression Note (Addendum)
Transition of Care [facility]) - Progression Note    Patient Details  Name: Jayron Maqueda MRN: [id number] Date of Birth: 1961/01/14  [facility]) CM/SW Contact  Ninfa Meeker, RN Phone Number: [phone] (working remotely) 09/30/2019, 2:22 PM  Clinical Narrative:   Patient's daughter is not allowing him to return to [place]. Wants him closer to [facility], and is agreeable to [place]. Tourist information centre manager contacted Admissions at [facility] in [place], [place], [phone], to find out if they are accepting patient. CM spoke with Arbie Cookey who states they are waiting for a COVID bed to become available and she will contact me when it does. Case manager confirmed that she has number to call .Will continue to monitor.         Expected Discharge Plan and Services           Expected Discharge Date: 09/28/19                                     Social Determinants of Health (SDOH) Interventions    Readmission Risk Interventions No flowsheet data found.

## 2019-09-30 NOTE — Progress Notes (Signed)
Called pt niece, phone call forwarded to voicemail & voice mailbox full at this time.

## 2019-09-30 NOTE — Progress Notes (Signed)
Spoke with Myrna (niece) updates given.  All questions answered at this time.

## 2019-09-30 NOTE — Progress Notes (Signed)
[name]  WPY:[id number] DOB: [birth year] DOA: 09/17/2019 PCP: Ancil Boozer, PA-C    Brief Narrative:  58 year old [facility] resident with a history of vascular dementia, nontraumatic intracerebral hemorrhage, portal vein thrombosis on chronic Xarelto, and a diagnosis of COVID-19 10/28 who presented to the ED with 24 hours of severely elevated blood sugars, fever to 105, and altered mental status.  The patient is reportedly conversant at baseline.  Significant Events: 11/5 admit to [facility] ICU via [facility] long ED 11/6 transfer to progressive care unit  COVID-19 specific Treatment: Remdesivir 11/5 > Decadron 11/5 >  Subjective: Patient himself denies any complaints, no significant events as discussed with staff  Assessment & Plan:  Pneumonia due to COVID-19  Lab Results  Component Value Date   [place] (A) 09/17/2019   [place] NOT DETECTED 03/30/2019   [place] NOT DETECTED 03/06/2019    Patient presented with high fever with a temperature of 105.4 F.  He was encephalopathic.  He has completed course of remdesivir.  Steroid is being tapered down.   Patient's respiratory status remains stable.  He is saturating normal on room air.    SIRS/lactic acidosis Due to the above.  Now resolved.  Acute metabolic encephalopathy No acute findings on CT head.  This was thought to be mainly in the setting of acute COVID-19 infection, electrolyte derangement and hypernatremia.  Patient also was dehydrated and so he was given IV fluids.  He did have elevated ammonia level initially but this has normalized without intervention.  B12 and folate levels were normal.   His mental status continues to improve .  Hyperammonemia Normalized without intervention.  History of portal vein thrombosis Chronically on rivaroxaban.  Was on Lovenox here due to poor oral intake.  Now back on rivaroxaban.  Acute kidney injury This was due to volume depletion and  dehydration.  Resolved with IV fluids.    Polycythemia This appears to be chronic.  Hypernatremia Secondary to dehydration.  Resolved with the D5 water.  Dysphagia Speech therapy has been following.  He is on a dysphagia 1 diet.  He will need continued therapy when he is at a skilled nursing facility.  Oral intake has been poor.  Hopefully with improvement in physical activity his appetite will improve.  This will need to be monitored at the skilled nursing facility.  Diabetes mellitus type 2, uncontrolled with hyperglycemia, new diagnosis This appears to be a new diagnosis for him.  HbA1c 12.8.  CBG has been improving as steroid is tapered down.  Lantus dose was decreased.  Monitor CBGs.    Hypokalemia Potassium has been repleted  DVT prophylaxis: On rivaroxaban. Code Status: DO NOT INTUBATE  Disposition Plan: SNF when insurance hospitalization is obtained Consultants:  PCCM, signed off  Antimicrobials:  Anti-infectives (From admission, onward)   Start     Dose/Rate Route Frequency Ordered Stop   09/18/19 1000  remdesivir 100 mg in sodium chloride 0.9 % 250 mL IVPB     100 mg 500 mL/hr over 30 Minutes Intravenous Every 24 hours 09/17/19 1117 09/21/19 1055   09/17/19 1200  remdesivir 200 mg in sodium chloride 0.9 % 250 mL IVPB     200 mg 500 mL/hr over 30 Minutes Intravenous Once 09/17/19 1117 09/17/19 1249   09/17/19 0745  ceFEPIme (MAXIPIME) 2 g in sodium chloride 0.9 % 100 mL IVPB     2 g 200 mL/hr over 30 Minutes Intravenous  Once 09/17/19 0734 09/17/19 0837   09/17/19  0745  vancomycin (VANCOCIN) 1,750 mg in sodium chloride 0.9 % 500 mL IVPB     1,750 mg 250 mL/hr over 120 Minutes Intravenous  Once 09/17/19 0734 09/17/19 1104       Objective: Blood pressure (!) 135/99, pulse (!) 101, temperature 98.7 F (37.1 C), temperature source Oral, resp. rate 18, height 5\' 11"  (1.803 m), weight 98.4 kg, SpO2 90 %.  Intake/Output Summary (Last 24 hours) at 09/30/2019 1312 Last  data filed at 09/30/2019 0400 Gross per 24 hour  Intake 240 ml  Output -  Net 240 ml   Filed Weights   09/17/19 0822 09/17/19 2200  Weight: (S) 81.6 kg 98.4 kg    Examination:  General appearance: Awake, easily distracted, alert x1 Resp: Clear to auscultation bilaterally.  Normal effort Cardio: S1-S2 is normal regular.  No S3-S4.  No rubs murmurs or bruit GI: Abdomen is soft.  Nontender nondistended.  Extremities: No edema or clubbing Neurologic: Not very communicative, but answering simple questions, follows simple commands , No obvious focal neurological deficits.    CBC: No results for input(s): WBC, NEUTROABS, HGB, HCT, MCV, PLT in the last 168 hours. Basic Metabolic Panel: Recent Labs  Lab 09/24/19 0420  09/26/19 0147 09/27/19 0350 09/28/19 0530  NA 143   < > 140 144 141  K 3.8   < > 5.0 4.6 3.7  CL 106   < > 107 106 105  CO2 27   < > 20* 25 26  GLUCOSE 54*   < > 53* 71 80  BUN 22*   < > 20 21* 21*  CREATININE 1.17   < > 1.18 1.07 1.20  CALCIUM 8.9   < > 8.5* 9.2 9.1  MG 2.3  --   --   --   --    < > = values in this interval not displayed.   GFR: Estimated Creatinine Clearance: 81.2 mL/min (by C-G formula based on SCr of 1.2 mg/dL).  Liver Function Tests: No results for input(s): AST, ALT, ALKPHOS, BILITOT, PROT, ALBUMIN in the last 168 hours.   HbA1C: Hgb A1c MFr Bld  Date/Time Value Ref Range Status  09/17/2019 12:42 PM 12.8 (H) 4.8 - 5.6 % Final    Comment:    (NOTE) Pre diabetes:          5.7%-6.4% Diabetes:              >6.4% Glycemic control for   <7.0% adults with diabetes     CBG: Recent Labs  Lab 09/29/19 1559 09/29/19 2053 09/30/19 0003 09/30/19 0405 09/30/19 0832  GLUCAP 196* 170* 105* 154* 99    No results found for this or any previous visit (from the past 240 hour(s)).   Scheduled Meds: . Chlorhexidine Gluconate Cloth  6 each Topical Daily  . famotidine  20 mg Oral Daily  . insulin aspart  0-15 Units Subcutaneous Q4H   . insulin glargine  10 Units Subcutaneous Daily  . mouth rinse  15 mL Mouth Rinse BID  . metoprolol tartrate  25 mg Oral BID  . multivitamin with minerals  1 tablet Oral Daily  . rivaroxaban  20 mg Oral Q supper  . thiamine  100 mg Oral Daily    LOS: 13 days   10/02/19 MD 09/30/2019  [facility] Office  [phone] Pager - Text Page per [phone]  If 7PM-7AM, please contact night-coverage per Amion 09/30/2019, 1:12 PM

## 2019-10-01 DIAGNOSIS — G9341 Metabolic encephalopathy: Secondary | ICD-10-CM | POA: Diagnosis not present

## 2019-10-01 LAB — GLUCOSE, CAPILLARY
Glucose-Capillary: 116 mg/dL — ABNORMAL HIGH (ref 70–99)
Glucose-Capillary: 136 mg/dL — ABNORMAL HIGH (ref 70–99)
Glucose-Capillary: 139 mg/dL — ABNORMAL HIGH (ref 70–99)
Glucose-Capillary: 143 mg/dL — ABNORMAL HIGH (ref 70–99)
Glucose-Capillary: 147 mg/dL — ABNORMAL HIGH (ref 70–99)
Glucose-Capillary: 311 mg/dL — ABNORMAL HIGH (ref 70–99)
Glucose-Capillary: 98 mg/dL (ref 70–99)

## 2019-10-01 NOTE — Progress Notes (Signed)
RN called pt niece, Maxwell Miller, phone call went to voicemail & voicemailbox full at this time.

## 2019-10-01 NOTE — Progress Notes (Signed)
Maxwell Miller  [id number] DOB: Apr 14, 1961 DOA: 09/17/2019 PCP: Ancil Boozer, PA-C    Brief Narrative:  58 year old Maxwell Miller SNF resident with a history of vascular dementia, nontraumatic intracerebral hemorrhage, portal vein thrombosis on chronic Xarelto, and a diagnosis of COVID-19 10/28 who presented to the ED with 24 hours of severely elevated blood sugars, fever to 105, and altered mental status.  The patient is reportedly conversant at baseline.  Significant Events: 11/5 admit to [facility] ICU via [facility] long ED 11/6 transfer to progressive care unit  COVID-19 specific Treatment: Remdesivir 11/5 > Decadron 11/5 >  Subjective: Patient himself denies any complaints, no significant events as discussed with staff  Assessment & Plan:  Pneumonia due to COVID-19  Lab Results  Component Value Date   [place] (A) 09/17/2019   [place] NOT DETECTED 03/30/2019   [place] NOT DETECTED 03/06/2019    Patient presented with high fever with a temperature of 105.4 F.  He was encephalopathic.  He has completed course of remdesivir.  Steroid is being tapered down.   Patient's respiratory status remains stable.  He is saturating normal on room air.    SIRS/lactic acidosis Due to the above.  Now resolved.  Acute metabolic encephalopathy No acute findings on CT head.  This was thought to be mainly in the setting of acute COVID-19 infection, electrolyte derangement and hypernatremia.  Patient also was dehydrated and so he was given IV fluids.  He did have elevated ammonia level initially but this has normalized without intervention.  B12 and folate levels were normal.   His mental status continues to improve .  Hyperammonemia Normalized without intervention.  History of portal vein thrombosis Chronically on rivaroxaban.  Was on Lovenox here due to poor oral intake.  Now back on rivaroxaban.  Acute kidney injury This was due to volume depletion and  dehydration.  Resolved with IV fluids.    Polycythemia This appears to be chronic.  Hypernatremia Secondary to dehydration.  Resolved with the D5 water.  Dysphagia Speech therapy has been following.  He is on a dysphagia 1 diet.  He will need continued therapy when he is at a skilled nursing facility.  Oral intake has been poor.  Hopefully with improvement in physical activity his appetite will improve.  This will need to be monitored at the skilled nursing facility.  Diabetes mellitus type 2, uncontrolled with hyperglycemia, new diagnosis This appears to be a new diagnosis for him.  HbA1c 12.8.  CBG has been improving as steroid is tapered down.  Lantus dose was decreased.  Monitor CBGs.    Hypokalemia Potassium has been repleted  DVT prophylaxis: On rivaroxaban. Code Status: DO NOT INTUBATE  Disposition Plan: SNF when insurance authorization is obtained Consultants:  PCCM, signed off  Antimicrobials:  Anti-infectives (From admission, onward)   Start     Dose/Rate Route Frequency Ordered Stop   09/18/19 1000  remdesivir 100 mg in sodium chloride 0.9 % 250 mL IVPB     100 mg 500 mL/hr over 30 Minutes Intravenous Every 24 hours 09/17/19 1117 09/21/19 1055   09/17/19 1200  remdesivir 200 mg in sodium chloride 0.9 % 250 mL IVPB     200 mg 500 mL/hr over 30 Minutes Intravenous Once 09/17/19 1117 09/17/19 1249   09/17/19 0745  ceFEPIme (MAXIPIME) 2 g in sodium chloride 0.9 % 100 mL IVPB     2 g 200 mL/hr over 30 Minutes Intravenous  Once 09/17/19 0734 09/17/19 0837   09/17/19  0745  vancomycin (VANCOCIN) 1,750 mg in sodium chloride 0.9 % 500 mL IVPB     1,750 mg 250 mL/hr over 120 Minutes Intravenous  Once 09/17/19 0734 09/17/19 1104       Objective: Blood pressure (!) 142/87, pulse (!) 110, temperature 98.7 F (37.1 C), resp. rate 18, height 5\' 11"  (1.803 m), weight 98.4 kg, SpO2 99 %.  Intake/Output Summary (Last 24 hours) at 10/01/2019 1603 Last data filed at 10/01/2019  1400 Gross per 24 hour  Intake -  Output 1200 ml  Net -1200 ml   Filed Weights   09/17/19 0822 09/17/19 2200  Weight: (S) 81.6 kg 98.4 kg    Examination:  Awake Alert, Oriented X 1, communicative, but clearly with impaired cognition and insight, easily distracted, pleasant. Symmetrical Chest wall movement, Good air movement bilaterally, CTAB RRR,No Gallops,Rubs or new Murmurs, No Parasternal Heave +ve B.Sounds, Abd Soft, No tenderness, No rebound - guarding or rigidity. No Cyanosis, Clubbing or edema, No new Rash or bruise       CBC: No results for input(s): WBC, NEUTROABS, HGB, HCT, MCV, PLT in the last 168 hours. Basic Metabolic Panel: Recent Labs  Lab 09/26/19 0147 09/27/19 0350 09/28/19 0530  NA 140 144 141  K 5.0 4.6 3.7  CL 107 106 105  CO2 20* 25 26  GLUCOSE 53* 71 80  BUN 20 21* 21*  CREATININE 1.18 1.07 1.20  CALCIUM 8.5* 9.2 9.1   GFR: Estimated Creatinine Clearance: 81.2 mL/min (by C-G formula based on SCr of 1.2 mg/dL).  Liver Function Tests: No results for input(s): AST, ALT, ALKPHOS, BILITOT, PROT, ALBUMIN in the last 168 hours.   HbA1C: Hgb A1c MFr Bld  Date/Time Value Ref Range Status  09/17/2019 12:42 PM 12.8 (H) 4.8 - 5.6 % Final    Comment:    (NOTE) Pre diabetes:          5.7%-6.4% Diabetes:              >6.4% Glycemic control for   <7.0% adults with diabetes     CBG: Recent Labs  Lab 10/01/19 0007 10/01/19 0020 10/01/19 0448 10/01/19 0754 10/01/19 1152  GLUCAP 98 116* 136* 139* 311*    No results found for this or any previous visit (from the past 240 hour(s)).   Scheduled Meds: . Chlorhexidine Gluconate Cloth  6 each Topical Daily  . famotidine  20 mg Oral Daily  . insulin aspart  0-15 Units Subcutaneous Q4H  . insulin glargine  10 Units Subcutaneous Daily  . mouth rinse  15 mL Mouth Rinse BID  . metoprolol tartrate  25 mg Oral BID  . multivitamin with minerals  1 tablet Oral Daily  . rivaroxaban  20 mg Oral Q  supper  . thiamine  100 mg Oral Daily    LOS: 14 days   10/03/19 MD 10/01/2019  [facility] Office  [phone] Pager - Text Page per [phone]  If 7PM-7AM, please contact night-coverage per Amion 10/01/2019, 4:03 PM

## 2019-10-02 DIAGNOSIS — G9341 Metabolic encephalopathy: Secondary | ICD-10-CM | POA: Diagnosis not present

## 2019-10-02 LAB — GLUCOSE, CAPILLARY
Glucose-Capillary: 120 mg/dL — ABNORMAL HIGH (ref 70–99)
Glucose-Capillary: 121 mg/dL — ABNORMAL HIGH (ref 70–99)
Glucose-Capillary: 126 mg/dL — ABNORMAL HIGH (ref 70–99)
Glucose-Capillary: 134 mg/dL — ABNORMAL HIGH (ref 70–99)
Glucose-Capillary: 168 mg/dL — ABNORMAL HIGH (ref 70–99)
Glucose-Capillary: 299 mg/dL — ABNORMAL HIGH (ref 70–99)

## 2019-10-02 NOTE — TOC Progression Note (Addendum)
Transition of Care [facility]) - Progression Note    Patient Details  Name: Abdi Husak MRN: [id number] Date of Birth: [birth year]  Transition of Care [facility]) CM/SW Contact  Loletha Grayer Beverely Pace, RN Phone Number: 10/02/2019, 1:26 PM  Clinical Narrative:   Case Manager received call from [place] with [facility], she states they still do not have a COVID, will keep him on their list. Case manager is going to contact patient's niece and see if we can fax him to other facilities.  1:50pm Spoke with patient's niece, Robbi Garter, she has accepted the bed that has been offered by [facility]. Contacted [facility] and they will begin insurance authorization, will likely be Monday or Tuesday before we get response.         Expected Discharge Plan and Services           Expected Discharge Date: 09/28/19                                     Social Determinants of Health (SDOH) Interventions    Readmission Risk Interventions No flowsheet data found.

## 2019-10-02 NOTE — Progress Notes (Signed)
Maxwell Miller  [id number] DOB: [birth year] DOA: 09/17/2019 PCP: Ancil Boozer, PA-C    Brief Narrative:  58 year old Maxwell Miller with a history of vascular dementia, nontraumatic intracerebral hemorrhage, portal vein thrombosis on chronic Xarelto, and a diagnosis of COVID-19 10/28 who presented to the ED with 24 hours of severely elevated blood sugars, fever to 105, and altered mental status.  The patient is reportedly conversant at baseline.  Significant Events: 11/5 admit to [facility] ICU via [facility] long ED 11/6 transfer to progressive care unit  COVID-19 specific Treatment: Remdesivir 11/5 > Decadron 11/5 >  Subjective: Patient himself denies any complaints, no significant events as discussed with staff  Assessment & Plan:  Pneumonia due to COVID-19  Lab Results  Component Value Date   [name] 09/17/2019   [place] NOT DETECTED 03/30/2019   [place] NOT DETECTED 03/06/2019    Patient presented with high fever with a temperature of 105.4 F.  He was encephalopathic.  He has completed course of remdesivir.  Steroid is being tapered down.   Patient's respiratory status remains stable.  He is saturating normal on room air.  And he remains afebrile he had low-grade temp overnight 99.6, cussed with staff, will encourage out of bed to chair with assistance and incentive spirometry  SIRS/lactic acidosis Due to the above.  Now resolved.  Acute metabolic encephalopathy No acute findings on CT head.  This was thought to be mainly in the setting of acute COVID-19 infection, electrolyte derangement and hypernatremia.  Patient also was dehydrated and so he was given IV fluids.  He did have elevated ammonia level initially but this has normalized without intervention.  B12 and folate levels were normal.   His mental status continues to improve .  Hyperammonemia Normalized without intervention.  History of portal vein thrombosis  Chronically on rivaroxaban.  Was on Lovenox here due to poor oral intake.  Now back on rivaroxaban.  Acute kidney injury This was due to volume depletion and dehydration.  Resolved with IV fluids.    Polycythemia This appears to be chronic.  Hypernatremia Secondary to dehydration.  Resolved with the D5 water.  Dysphagia Speech therapy has been following.  He is on a dysphagia 1 diet.  He will need continued therapy when he is at a skilled nursing facility.  Oral intake has been poor.  Hopefully with improvement in physical activity his appetite will improve.  This will need to be monitored at the skilled nursing facility.  Diabetes mellitus type 2, uncontrolled with hyperglycemia, new diagnosis This appears to be a new diagnosis for him.  HbA1c 12.8.  CBG has been improving as steroid is tapered down.  Lantus dose was decreased.  Monitor CBGs.    Hypokalemia Potassium has been repleted  DVT prophylaxis: On rivaroxaban. Code Status: DO NOT INTUBATE  Disposition Plan: SNF when insurance authorization is obtained, as discussed with social worker possibly Monday or Tuesday Consultants:  PCCM, signed off  Antimicrobials:  Anti-infectives (From admission, onward)   Start     Dose/Rate Route Frequency Ordered Stop   09/18/19 1000  remdesivir 100 mg in sodium chloride 0.9 % 250 mL IVPB     100 mg 500 mL/hr over 30 Minutes Intravenous Every 24 hours 09/17/19 1117 09/21/19 1055   09/17/19 1200  remdesivir 200 mg in sodium chloride 0.9 % 250 mL IVPB     200 mg 500 mL/hr over 30 Minutes Intravenous Once 09/17/19 1117 09/17/19 1249   09/17/19 0745  ceFEPIme (MAXIPIME) 2 g in sodium chloride 0.9 % 100 mL IVPB     2 g 200 mL/hr over 30 Minutes Intravenous  Once 09/17/19 0734 09/17/19 0837   09/17/19 0745  vancomycin (VANCOCIN) 1,750 mg in sodium chloride 0.9 % 500 mL IVPB     1,750 mg 250 mL/hr over 120 Minutes Intravenous  Once 09/17/19 0734 09/17/19 1104       Objective: Blood  pressure 120/79, pulse 98, temperature 98.6 F (37 C), temperature source Oral, resp. rate 16, height 5\' 11"  (1.803 m), weight 98.4 kg, SpO2 97 %.  Intake/Output Summary (Last 24 hours) at 10/02/2019 1359 Last data filed at 10/01/2019 2237 Gross per 24 hour  Intake -  Output 650 ml  Net -650 ml   Filed Weights   09/17/19 0822 09/17/19 2200  Weight: (S) 81.6 kg 98.4 kg    Examination:  Awake Alert, Oriented X 1, communicative, but clearly with impaired cognition and insight, easily distracted, pleasant. Symmetrical Chest wall movement, diminished air entry at the bases, no wheezing or rhonchi RRR,No Gallops,Rubs or new Murmurs, No Parasternal Heave +ve B.Sounds, Abd Soft, No tenderness, No rebound - guarding or rigidity. No Cyanosis, Clubbing or edema, No new Rash or bruise        CBC: No results for input(s): WBC, NEUTROABS, HGB, HCT, MCV, PLT in the last 168 hours. Basic Metabolic Panel: Recent Labs  Lab 09/26/19 0147 09/27/19 0350 09/28/19 0530  NA 140 144 141  K 5.0 4.6 3.7  CL 107 106 105  CO2 20* 25 26  GLUCOSE 53* 71 80  BUN 20 21* 21*  CREATININE 1.18 1.07 1.20  CALCIUM 8.5* 9.2 9.1   GFR: Estimated Creatinine Clearance: 81.2 mL/min (by C-G formula based on SCr of 1.2 mg/dL).  Liver Function Tests: No results for input(s): AST, ALT, ALKPHOS, BILITOT, PROT, ALBUMIN in the last 168 hours.   HbA1C: Hgb A1c MFr Bld  Date/Time Value Ref Range Status  09/17/2019 12:42 PM 12.8 (H) 4.8 - 5.6 % Final    Comment:    (NOTE) Pre diabetes:          5.7%-6.4% Diabetes:              >6.4% Glycemic control for   <7.0% adults with diabetes     CBG: Recent Labs  Lab 10/01/19 1937 10/02/19 0019 10/02/19 0512 10/02/19 0745 10/02/19 1201  GLUCAP 147* 168* 126* 134* 299*    No results found for this or any previous visit (from the past 240 hour(s)).   Scheduled Meds: . Chlorhexidine Gluconate Cloth  6 each Topical Daily  . famotidine  20 mg Oral Daily   . insulin aspart  0-15 Units Subcutaneous Q4H  . insulin glargine  10 Units Subcutaneous Daily  . mouth rinse  15 mL Mouth Rinse BID  . metoprolol tartrate  25 mg Oral BID  . multivitamin with minerals  1 tablet Oral Daily  . rivaroxaban  20 mg Oral Q supper  . thiamine  100 mg Oral Daily    LOS: 15 days   10/04/19 MD 10/02/2019  [facility] Office  [phone] Pager - Text Page per [phone]  If 7PM-7AM, please contact night-coverage per Amion 10/02/2019, 1:59 PM

## 2019-10-03 DIAGNOSIS — G9341 Metabolic encephalopathy: Secondary | ICD-10-CM | POA: Diagnosis not present

## 2019-10-03 LAB — BASIC METABOLIC PANEL
Anion gap: 10 (ref 5–15)
BUN: 11 mg/dL (ref 6–20)
CO2: 27 mmol/L (ref 22–32)
Calcium: 9.1 mg/dL (ref 8.9–10.3)
Chloride: 101 mmol/L (ref 98–111)
Creatinine, Ser: 1.17 mg/dL (ref 0.61–1.24)
GFR calc Af Amer: >60 mL/min (ref >60)
GFR calc non Af Amer: >60 mL/min (ref >60)
Glucose, Bld: 120 mg/dL — ABNORMAL HIGH (ref 70–99)
Potassium: 3.3 mmol/L — ABNORMAL LOW (ref 3.5–5.1)
Sodium: 138 mmol/L (ref 135–145)

## 2019-10-03 LAB — GLUCOSE, CAPILLARY
Glucose-Capillary: 110 mg/dL — ABNORMAL HIGH (ref 70–99)
Glucose-Capillary: 129 mg/dL — ABNORMAL HIGH (ref 70–99)
Glucose-Capillary: 130 mg/dL — ABNORMAL HIGH (ref 70–99)
Glucose-Capillary: 130 mg/dL — ABNORMAL HIGH (ref 70–99)
Glucose-Capillary: 145 mg/dL — ABNORMAL HIGH (ref 70–99)
Glucose-Capillary: 155 mg/dL — ABNORMAL HIGH (ref 70–99)
Glucose-Capillary: 181 mg/dL — ABNORMAL HIGH (ref 70–99)
Glucose-Capillary: 236 mg/dL — ABNORMAL HIGH (ref 70–99)

## 2019-10-03 LAB — CBC
HCT: 52.9 % — ABNORMAL HIGH (ref 39.0–52.0)
Hemoglobin: 17.5 g/dL — ABNORMAL HIGH (ref 13.0–17.0)
MCH: 28.5 pg (ref 26.0–34.0)
MCHC: 33.1 g/dL (ref 30.0–36.0)
MCV: 86.2 fL (ref 80.0–100.0)
Platelets: 229 10*3/uL (ref 150–400)
RBC: 6.14 MIL/uL — ABNORMAL HIGH (ref 4.22–5.81)
RDW: 14.9 % (ref 11.5–15.5)
WBC: 6.7 10*3/uL (ref 4.0–10.5)
nRBC: 0 % (ref 0.0–0.2)

## 2019-10-03 MED ORDER — POTASSIUM CHLORIDE CRYS ER 20 MEQ PO TBCR
30.0000 meq | EXTENDED_RELEASE_TABLET | Freq: Four times a day (QID) | ORAL | Status: AC
  Administered 2019-10-03 (×2): 30 meq via ORAL
  Filled 2019-10-03 (×2): qty 2

## 2019-10-03 NOTE — Progress Notes (Signed)
Physical Therapy Treatment Patient Details Name: Maxwell Miller MRN: [id number] DOB: 03-01-1961 Today's Date: 10/03/2019    History of Present Illness 58 y/o w/ hx of vascular dementia, CVA, portal vein thrombosis, non traumatic intracerebral hemorrhage, HLD, HTN, COVID 19    PT Comments    Pt continues to improve with tx, he was able to tolerate more activity this am and with less assistance. Now completing bed mob and transfers with SBA- Mod I, still needs cues for safety and als line management. Therapist noted that his Iv was on floor but pt states he does not recall pulling it out. Pt was able to ambulate approx 189ft w/ RW and SBA w/ VCs in hall. Gait pattern looks antalgic pt states he has pain in R knee but when asked to qualify pain unable to and states just has some "weakness" in knee. Pt is on room air and sats remained in 90s throughout session. Will continue to follow acutely and recommend return to SNF at d/c.    Follow Up Recommendations  SNF     Equipment Recommendations  None recommended by PT    Recommendations for Other Services       Precautions / Restrictions Precautions Precautions: Fall Restrictions Weight Bearing Restrictions: No    Mobility  Bed Mobility Overal bed mobility: Modified Independent                Transfers Overall transfer level: Needs assistance Equipment used: Rolling walker (2 wheeled) Transfers: Sit to/from Stand Sit to Stand: Supervision;Modified independent (Device/Increase time)            Ambulation/Gait Ambulation/Gait assistance: Supervision Gait Distance (Feet): 180 Feet Assistive device: Rolling walker (2 wheeled) Gait Pattern/deviations: Step-through pattern     General Gait Details: ambulated in hall this am, noted pt to be slightly imping, when asked states that he has some pain in R knee, when asked to qualify this unable to just states he feels some "weakness" in knee   Stairs              Wheelchair Mobility    Modified Rankin (Stroke Patients Only)       Balance Overall balance assessment: Needs assistance Sitting-balance support: Feet unsupported Sitting balance-Leahy Scale: Good     Standing balance support: During functional activity;Bilateral upper extremity supported Standing balance-Leahy Scale: Fair                              Cognition Arousal/Alertness: Awake/alert Behavior During Therapy: Flat affect Overall Cognitive Status: No family/caregiver present to determine baseline cognitive functioning                                 General Comments: again seems to be more talkertive.      Exercises      General Comments General comments (skin integrity, edema, etc.): Pt remains on room air and sats still staying in 90s with ambulation      Pertinent Vitals/Pain Pain Assessment: No/denies pain    Home Living                      Prior Function            PT Goals (current goals can now be found in the care plan section) Acute Rehab PT Goals Patient Stated Goal: no new complaints, have noticied that his IV  is on floor, he states he doesnt recall pulling it out Time For Goal Achievement: 10/09/19 Potential to Achieve Goals: Good Progress towards PT goals: Progressing toward goals    Frequency    Min 2X/week      PT Plan Current plan remains appropriate    Co-evaluation              AM-PAC PT "6 Clicks" Mobility   Outcome Measure  Help needed turning from your back to your side while in a flat bed without using bedrails?: None Help needed moving from lying on your back to sitting on the side of a flat bed without using bedrails?: None Help needed moving to and from a bed to a chair (including a wheelchair)?: None Help needed standing up from a chair using your arms (e.g., wheelchair or bedside chair)?: None Help needed to walk in hospital room?: A Little Help needed climbing 3-5  steps with a railing? : A Lot 6 Click Score: 21    End of Session   Activity Tolerance: Patient tolerated treatment well Patient left: in bed;with call bell/phone within reach   PT Visit Diagnosis: Other abnormalities of gait and mobility (R26.89);Muscle weakness (generalized) (M62.81)     Time: 7741-2878 PT Time Calculation (min) (ACUTE ONLY): 16 min  Charges:  $Gait Training: 8-22 mins                     Horald Chestnut, PT    Delford Field 10/03/2019, 1:28 PM

## 2019-10-03 NOTE — Progress Notes (Signed)
Maxwell Miller  QGB:[id number] DOB: 1961/02/15 DOA: 09/17/2019 PCP: Ancil Boozer, PA-C    Brief Narrative:  58 year old Maxwell Miller SNF resident with a history of vascular dementia, nontraumatic intracerebral hemorrhage, portal vein thrombosis on chronic Xarelto, and a diagnosis of COVID-19 10/28 who presented to the ED with 24 hours of severely elevated blood sugars, fever to 105, and altered mental status.  The patient is reportedly conversant at baseline.  Significant Events: 11/5 admit to [facility] ICU via [facility] long ED 11/6 transfer to progressive care unit  COVID-19 specific Treatment: Remdesivir 11/5 > Decadron 11/5 >  Subjective: Patient denies any complaints, no significant events as discussed with staff  Assessment & Plan:  Pneumonia due to COVID-19  Lab Results  Component Value Date   [place] (A) 09/17/2019   [place] NOT DETECTED 03/30/2019   [place] NOT DETECTED 03/06/2019    Patient presented with high fever with a temperature of 105.4 F.  He was encephalopathic.  He has completed course of remdesivir.  Completed steroids as well. -No hypoxia, dyspnea or respiratory distress  SIRS/lactic acidosis Due to the above.  Now resolved.  Acute metabolic encephalopathy No acute findings on CT head.  This was thought to be mainly in the setting of acute COVID-19 infection, electrolyte derangement and hypernatremia.  Patient also was dehydrated and so he was given IV fluids.  He did have elevated ammonia level initially but this has normalized without intervention.  B12 and folate levels were normal.   His mental status continues to improve .  Hyperammonemia Normalized without intervention.  History of portal vein thrombosis Chronically on rivaroxaban.  Was on Lovenox here due to poor oral intake.  Now back on rivaroxaban.  Acute kidney injury This was due to volume depletion and dehydration.  Resolved with IV fluids.     Polycythemia This appears to be chronic.  Hypernatremia Secondary to dehydration.  Resolved with the D5 water.  Hypokalemia -Repleted  Dysphagia Speech therapy has been following.  He is on a dysphagia 1 diet.  He will need continued therapy when he is at a skilled nursing facility.  Oral intake has been poor.  Hopefully with improvement in physical activity his appetite will improve.  This will need to be monitored at the skilled nursing facility.  Diabetes mellitus type 2, uncontrolled with hyperglycemia, new diagnosis This appears to be a new diagnosis for him.  HbA1c 12.8.  CBG has been improving as steroid is tapered down.  Lantus dose was decreased.  Monitor CBGs.    Hypokalemia Potassium has been repleted  DVT prophylaxis: On rivaroxaban. Code Status: DO NOT INTUBATE  Disposition Plan: SNF when insurance authorization is obtained, as discussed with social worker possibly Monday or Tuesday Consultants:  PCCM, signed off  Antimicrobials:  Anti-infectives (From admission, onward)   Start     Dose/Rate Route Frequency Ordered Stop   09/18/19 1000  remdesivir 100 mg in sodium chloride 0.9 % 250 mL IVPB     100 mg 500 mL/hr over 30 Minutes Intravenous Every 24 hours 09/17/19 1117 09/21/19 1055   09/17/19 1200  remdesivir 200 mg in sodium chloride 0.9 % 250 mL IVPB     200 mg 500 mL/hr over 30 Minutes Intravenous Once 09/17/19 1117 09/17/19 1249   09/17/19 0745  ceFEPIme (MAXIPIME) 2 g in sodium chloride 0.9 % 100 mL IVPB     2 g 200 mL/hr over 30 Minutes Intravenous  Once 09/17/19 0734 09/17/19 0837   09/17/19 0745  vancomycin (VANCOCIN) 1,750 mg in sodium chloride 0.9 % 500 mL IVPB     1,750 mg 250 mL/hr over 120 Minutes Intravenous  Once 09/17/19 0734 09/17/19 1104       Objective: Blood pressure (!) 125/91, pulse 96, temperature 98.9 F (37.2 C), temperature source Oral, resp. rate 16, height 5\' 11"  (1.803 m), weight 98.4 kg, SpO2 95 %.  Intake/Output Summary (Last  24 hours) at 10/03/2019 1151 Last data filed at 10/03/2019 1100 Gross per 24 hour  Intake -  Output 600 ml  Net -600 ml   Filed Weights   09/17/19 0822 09/17/19 2200  Weight: (S) 81.6 kg 98.4 kg    Examination:  Awake Alert, Oriented X 1, communicative, but clearly with impaired cognition and insight, easily distracted, pleasant. Symmetrical Chest wall movement, Good air movement bilaterally, CTAB RRR,No Gallops,Rubs or new Murmurs, No Parasternal Heave +ve B.Sounds, Abd Soft, No tenderness, No rebound - guarding or rigidity. No Cyanosis, Clubbing or edema, No new Rash or bruise        CBC: Recent Labs  Lab 10/03/19 0224  WBC 6.7  HGB 17.5*  HCT 52.9*  MCV 86.2  PLT 229   Basic Metabolic Panel: Recent Labs  Lab 09/27/19 0350 09/28/19 0530 10/03/19 0224  NA 144 141 138  K 4.6 3.7 3.3*  CL 106 105 101  CO2 25 26 27   GLUCOSE 71 80 120*  BUN 21* 21* 11  CREATININE 1.07 1.20 1.17  CALCIUM 9.2 9.1 9.1   GFR: Estimated Creatinine Clearance: 83.3 mL/min (by C-G formula based on SCr of 1.17 mg/dL).  Liver Function Tests: No results for input(s): AST, ALT, ALKPHOS, BILITOT, PROT, ALBUMIN in the last 168 hours.   HbA1C: Hgb A1c MFr Bld  Date/Time Value Ref Range Status  09/17/2019 12:42 PM 12.8 (H) 4.8 - 5.6 % Final    Comment:    (NOTE) Pre diabetes:          5.7%-6.4% Diabetes:              >6.4% Glycemic control for   <7.0% adults with diabetes     CBG: Recent Labs  Lab 10/02/19 1631 10/02/19 2028 10/03/19 0018 10/03/19 0457 10/03/19 0724  GLUCAP 121* 120* 181* 129* 145*    No results found for this or any previous visit (from the past 240 hour(s)).   Scheduled Meds: . Chlorhexidine Gluconate Cloth  6 each Topical Daily  . famotidine  20 mg Oral Daily  . insulin aspart  0-15 Units Subcutaneous Q4H  . insulin glargine  10 Units Subcutaneous Daily  . mouth rinse  15 mL Mouth Rinse BID  . metoprolol tartrate  25 mg Oral BID  . multivitamin  with minerals  1 tablet Oral Daily  . potassium chloride  30 mEq Oral Q6H  . rivaroxaban  20 mg Oral Q supper  . thiamine  100 mg Oral Daily    LOS: 16 days   10/05/19 MD 10/03/2019  [facility] Office  [phone] Pager - Text Page per 10/05/2019  If 7PM-7AM, please contact night-coverage per Amion 10/03/2019, 11:51 AM

## 2019-10-03 NOTE — Progress Notes (Signed)
Patient's niece, Robbi Garter, called to update pt condition. Message left to return call for update.

## 2019-10-04 DIAGNOSIS — G9341 Metabolic encephalopathy: Secondary | ICD-10-CM | POA: Diagnosis not present

## 2019-10-04 LAB — GLUCOSE, CAPILLARY
Glucose-Capillary: 121 mg/dL — ABNORMAL HIGH (ref 70–99)
Glucose-Capillary: 128 mg/dL — ABNORMAL HIGH (ref 70–99)
Glucose-Capillary: 211 mg/dL — ABNORMAL HIGH (ref 70–99)
Glucose-Capillary: 110 mg/dL — ABNORMAL HIGH (ref 70–99)
Glucose-Capillary: 153 mg/dL — ABNORMAL HIGH (ref 70–99)

## 2019-10-04 MED ORDER — INSULIN ASPART 100 UNIT/ML ~~LOC~~ SOLN: 0.0000 [IU] | Freq: Three times a day (TID) | SUBCUTANEOUS | Status: DC

## 2019-10-04 MED ORDER — INSULIN ASPART 100 UNIT/ML ~~LOC~~ SOLN: 0.0000 [IU] | Freq: Every day | SUBCUTANEOUS | Status: DC

## 2019-10-04 MED ORDER — INSULIN ASPART 100 UNIT/ML ~~LOC~~ SOLN
0.0000 [IU] | Freq: Three times a day (TID) | SUBCUTANEOUS | Status: DC
  Administered 2019-10-05: 2 [IU] via SUBCUTANEOUS
  Administered 2019-10-05 – 2019-10-06 (×2): 5 [IU] via SUBCUTANEOUS
  Administered 2019-10-07 – 2019-10-11 (×3): 3 [IU] via SUBCUTANEOUS
  Administered 2019-10-12 – 2019-10-14 (×4): 2 [IU] via SUBCUTANEOUS
  Administered 2019-10-14 – 2019-10-16 (×6): 3 [IU] via SUBCUTANEOUS
  Administered 2019-10-17: 5 [IU] via SUBCUTANEOUS
  Administered 2019-10-18: 3 [IU] via SUBCUTANEOUS

## 2019-10-04 NOTE — Progress Notes (Signed)
Unable to reach [place], patients niece.  Voicemail box is full.

## 2019-10-04 NOTE — Plan of Care (Signed)
Pt updated on POC, pt confused at times so reinforcement needed.

## 2019-10-04 NOTE — Progress Notes (Signed)
Message sent to MD Elgergawy at 1626 to ask if pt can be [facility] glucose checks to be able to get rest at night instead of Q4H.  MD stated to change glucose checks to [facility] at 1749, will change order. 10/04/2019 [place], RN

## 2019-10-04 NOTE — Progress Notes (Signed)
Maxwell Miller  PXT:[id number] DOB: 01/29/1961 DOA: 09/17/2019 PCP: Mable Paris, PA-C    Brief Narrative:  58 year old [place] SNF resident with a history of vascular dementia, nontraumatic intracerebral hemorrhage, portal vein thrombosis on chronic Xarelto, and a diagnosis of COVID-19 10/28 who presented to the ED with 24 hours of severely elevated blood sugars, fever to 105, and altered mental status.  The patient is reportedly conversant at baseline.  Significant Events: 11/5 admit to [facility] ICU via [facility] long ED 11/6 transfer to progressive care unit  COVID-19 specific Treatment: Remdesivir 11/5 > Decadron 11/5 >  Subjective: Patient denies any complaints, no significant events as discussed with staff  Assessment & Plan:  Pneumonia due to COVID-19  Lab Results  Component Value Date   SARSCOV2NAA POSITIVE (A) 09/17/2019   SARSCOV2NAA NOT DETECTED 03/30/2019   SARSCOV2NAA NOT DETECTED 03/06/2019    Patient presented with high fever with a temperature of 105.4 F.  He was encephalopathic.  He has completed course of remdesivir.  Completed steroids as well. -No hypoxia, dyspnea or respiratory distress  SIRS/lactic acidosis Due to the above.  Now resolved.  Acute metabolic encephalopathy No acute findings on CT head.  This was thought to be mainly in the setting of acute COVID-19 infection, electrolyte derangement and hypernatremia.  Patient also was dehydrated and so he was given IV fluids.  He did have elevated ammonia level initially but this has normalized without intervention.  B12 and folate levels were normal.   His mental status continues to improve .  Hyperammonemia Normalized without intervention.  History of portal vein thrombosis Chronically on rivaroxaban.  Was on Lovenox here due to poor oral intake.  Now back on rivaroxaban.  Acute kidney injury This was due to volume depletion and dehydration.  Resolved with IV fluids.     Polycythemia This appears to be chronic.  Hypernatremia Secondary to dehydration.  Resolved with the D5 water.  Hypokalemia -Repleted  Dysphagia Speech therapy has been following.  He is on a dysphagia 1 diet.  He will need continued therapy when he is at a skilled nursing facility.  Oral intake has been poor.  Hopefully with improvement in physical activity his appetite will improve.  This will need to be monitored at the skilled nursing facility.  Diabetes mellitus type 2, uncontrolled with hyperglycemia, new diagnosis This appears to be a new diagnosis for him.  HbA1c 12.8.  CBG has been improving as steroid is stopped.  Lantus dose was decreased.  Monitor CBGs.    Hypokalemia Potassium has been repleted  DVT prophylaxis: On rivaroxaban. Code Status: DO NOT INTUBATE  Disposition Plan: SNF when insurance authorization is obtained, as discussed with social worker possibly Monday or Tuesday Consultants:  PCCM, signed off  Antimicrobials:  Anti-infectives (From admission, onward)   Start     Dose/Rate Route Frequency Ordered Stop   09/18/19 1000  remdesivir 100 mg in sodium chloride 0.9 % 250 mL IVPB     100 mg 500 mL/hr over 30 Minutes Intravenous Every 24 hours 09/17/19 1117 09/21/19 1055   09/17/19 1200  remdesivir 200 mg in sodium chloride 0.9 % 250 mL IVPB     200 mg 500 mL/hr over 30 Minutes Intravenous Once 09/17/19 1117 09/17/19 1249   09/17/19 0745  ceFEPIme (MAXIPIME) 2 g in sodium chloride 0.9 % 100 mL IVPB     2 g 200 mL/hr over 30 Minutes Intravenous  Once 09/17/19 0734 09/17/19 0837   09/17/19 0745  vancomycin (VANCOCIN) 1,750 mg in sodium chloride 0.9 % 500 mL IVPB     1,750 mg 250 mL/hr over 120 Minutes Intravenous  Once 09/17/19 0734 09/17/19 1104       Objective: Blood pressure (!) 148/99, pulse 96, temperature 98.5 F (36.9 C), temperature source Oral, resp. rate 20, height 5\' 11"  (1.803 m), weight 98.4 kg, SpO2 97 %. No intake or output data in the 24  hours ending 10/04/19 1122 Filed Weights   09/17/19 0822 09/17/19 2200  Weight: (S) 81.6 kg 98.4 kg    Examination:  Awake Alert, Oriented X 1, communicative, but clearly with impaired cognition and insight, easily distracted, pleasant. Symmetrical chest wall movement Regular rate and rhythm Abdomen soft Extremities with no cyanosis      CBC: Recent Labs  Lab 10/03/19 0224  WBC 6.7  HGB 17.5*  HCT 52.9*  MCV 86.2  PLT 160   Basic Metabolic Panel: Recent Labs  Lab 09/28/19 0530 10/03/19 0224  NA 141 138  K 3.7 3.3*  CL 105 101  CO2 26 27  GLUCOSE 80 120*  BUN 21* 11  CREATININE 1.20 1.17  CALCIUM 9.1 9.1   GFR: Estimated Creatinine Clearance: 83.3 mL/min (by C-G formula based on SCr of 1.17 mg/dL).  Liver Function Tests: No results for input(s): AST, ALT, ALKPHOS, BILITOT, PROT, ALBUMIN in the last 168 hours.   HbA1C: Hgb A1c MFr Bld  Date/Time Value Ref Range Status  09/17/2019 12:42 PM 12.8 (H) 4.8 - 5.6 % Final    Comment:    (NOTE) Pre diabetes:          5.7%-6.4% Diabetes:              >6.4% Glycemic control for   <7.0% adults with diabetes     CBG: Recent Labs  Lab 10/03/19 1629 10/03/19 1950 10/03/19 2324 10/04/19 0322 10/04/19 0750  GLUCAP 130* 130* 110* 121* 128*    No results found for this or any previous visit (from the past 240 hour(s)).   Scheduled Meds: . Chlorhexidine Gluconate Cloth  6 each Topical Daily  . famotidine  20 mg Oral Daily  . insulin aspart  0-15 Units Subcutaneous Q4H  . insulin glargine  10 Units Subcutaneous Daily  . mouth rinse  15 mL Mouth Rinse BID  . metoprolol tartrate  25 mg Oral BID  . multivitamin with minerals  1 tablet Oral Daily  . rivaroxaban  20 mg Oral Q supper  . thiamine  100 mg Oral Daily    LOS: 17 days   Phillips Climes MD 10/04/2019  [facility] Office  [phone] Pager - Text Page per Shea Evans  If 7PM-7AM, please contact night-coverage per Amion 10/04/2019, 11:22  AM

## 2019-10-05 DIAGNOSIS — G9341 Metabolic encephalopathy: Secondary | ICD-10-CM | POA: Diagnosis not present

## 2019-10-05 LAB — GLUCOSE, CAPILLARY
Glucose-Capillary: 112 mg/dL — ABNORMAL HIGH (ref 70–99)
Glucose-Capillary: 249 mg/dL — ABNORMAL HIGH (ref 70–99)
Glucose-Capillary: 121 mg/dL — ABNORMAL HIGH (ref 70–99)

## 2019-10-05 NOTE — Plan of Care (Signed)
  Problem: Education: Goal: Knowledge of General Education information will improve Description: Including pain rating scale, medication(s)/side effects and non-pharmacologic comfort measures Outcome: Progressing   Problem: Health Behavior/Discharge Planning: Goal: Ability to manage health-related needs will improve Outcome: Progressing   Problem: Clinical Measurements: Goal: Ability to maintain clinical measurements within normal limits will improve Outcome: Progressing Goal: Will remain free from infection Outcome: Progressing Goal: Diagnostic test results will improve Outcome: Progressing Goal: Respiratory complications will improve Outcome: Progressing Goal: Cardiovascular complication will be avoided Outcome: Progressing   Problem: Activity: Goal: Risk for activity intolerance will decrease Outcome: Progressing   Problem: Nutrition: Goal: Adequate nutrition will be maintained Outcome: Progressing   Problem: Coping: Goal: Level of anxiety will decrease Outcome: Progressing   Problem: Pain Managment: Goal: General experience of comfort will improve Outcome: Progressing   Problem: Safety: Goal: Ability to remain free from injury will improve Outcome: Progressing   Problem: Skin Integrity: Goal: Risk for impaired skin integrity will decrease Outcome: Progressing   Problem: Education: Goal: Knowledge of risk factors and measures for prevention of condition will improve Outcome: Progressing   Problem: Respiratory: Goal: Complications related to the disease process, condition or treatment will be avoided or minimized Outcome: Progressing

## 2019-10-05 NOTE — Evaluation (Signed)
Clinical/Bedside Swallow Evaluation Patient Details  Name: Maxwell Miller MRN: [id number] Date of Birth: 22-May-1961  Today's Date: 10/05/2019 Time: SLP Start Time (ACUTE ONLY): 8099 SLP Stop Time (ACUTE ONLY): 1419 SLP Time Calculation (min) (ACUTE ONLY): 15 min  Past Medical History:  Past Medical History:  Diagnosis Date  . Anal or rectal pain   . Anxiety disorder   . Clinical diagnosis of COVID-19   . Hyperplasia of prostate without lower urinary tract symptoms (LUTS)   . Hypertension   . Major depressive disorder, recurrent episode, moderate ([place])   . Mixed hyperlipidemia   . Nontraumatic intracerebral hemorrhage ([place])   . Other malaise   . Portal vein thrombosis   . Stroke ([place])   . Vascular dementia without behavioral disturbance [facility])    Past Surgical History: History reviewed. No pertinent surgical history. HPI:  Pt is a 58 yo male admitted from SNF with fever and AMS, positive for COVID-19. PMH: dementia, ICH   Assessment / Plan / Recommendation Clinical Impression   Pt was reassessed for swallowing function as per MD report, his mentation has improved back to baseline. He still has cognitive deficits (h/o dementia and ICH) but his oral preparation has improved since initial evaluation earlier this admission. He has no delays in mastication and clears his oral cavity well. No overt s/s of aspiration are observed. Recommend advancing diet to unrestricted textures. SLP will f/u briefly for tolerance.  SLP Visit Diagnosis: Dysphagia, unspecified (R13.10)    Aspiration Risk  Mild aspiration risk    Diet Recommendation Regular;Thin liquid   Liquid Administration via: Cup;Straw Medication Administration: Whole meds with liquid Supervision: Patient able to self feed;Intermittent supervision to cue for compensatory strategies Compensations: Minimize environmental distractions;Slow rate;Small sips/bites Postural Changes: Seated upright at 90 degrees    Other   Recommendations Oral Care Recommendations: Oral care BID   Follow up Recommendations 24 hour supervision/assistance      Frequency and Duration min 1 x/week  1 week       Prognosis Prognosis for Safe Diet Advancement: Good      Swallow Study   General HPI: Pt is a 58 yo male admitted from SNF with fever and AMS, positive for COVID-19. PMH: dementia, ICH Type of Study: Bedside Swallow Evaluation Previous Swallow Assessment: BSE this admission on 11/9 recommending Dys 2 diet, thin liquids; was downgraded to purees upon follow up Diet Prior to this Study: Dysphagia 1 (puree);Thin liquids Temperature Spikes Noted: No Respiratory Status: Room air History of Recent Intubation: No Behavior/Cognition: Alert;Cooperative;Requires cueing Oral Cavity Assessment: Within Functional Limits Oral Care Completed by SLP: No Oral Cavity - Dentition: Adequate natural dentition Vision: Functional for self-feeding Self-Feeding Abilities: Able to feed self Patient Positioning: Upright in bed Baseline Vocal Quality: Normal    Oral/Motor/Sensory Function Overall Oral Motor/Sensory Function: Within functional limits   Ice Chips Ice chips: Not tested   Thin Liquid Thin Liquid: Within functional limits Presentation: Cup;Self Fed    Nectar Thick Nectar Thick Liquid: Not tested   Honey Thick Honey Thick Liquid: Not tested   Puree Puree: Not tested   Solid     Solid: Within functional limits      Maxwell Miller Maxwell Miller 10/05/2019,2:19 PM  Maxwell Miller, M.A. [place] Acute Environmental education officer [phone] Office [phone]

## 2019-10-05 NOTE — Progress Notes (Signed)
Maxwell Miller  FFM:[id number] DOB: 1961-05-17 DOA: 09/17/2019 PCP: Mable Paris, PA-C    Brief Narrative:  58 year old [place] SNF resident with a history of vascular dementia, nontraumatic intracerebral hemorrhage, portal vein thrombosis on chronic Xarelto, and a diagnosis of COVID-19 10/28 who presented to the ED with 24 hours of severely elevated blood sugars, fever to 105, and altered mental status.  The patient is reportedly conversant at baseline.  Significant Events: 11/5 admit to [facility] ICU via [facility] long ED 11/6 transfer to progressive care unit  COVID-19 specific Treatment: Remdesivir 11/5 > Decadron 11/5 >  Subjective: No significant events overnight as discussed with staff  Assessment & Plan:  Pneumonia due to COVID-19  Lab Results  Component Value Date   SARSCOV2NAA POSITIVE (A) 09/17/2019   SARSCOV2NAA NOT DETECTED 03/30/2019   SARSCOV2NAA NOT DETECTED 03/06/2019    Patient presented with high fever with a temperature of 105.4 F.  He was encephalopathic.  He has completed course of remdesivir.  Completed steroids as well. -No hypoxia, dyspnea or respiratory distress  SIRS/lactic acidosis Due to the above.  Now resolved.  Acute metabolic encephalopathy No acute findings on CT head.  This was thought to be mainly in the setting of acute COVID-19 infection, electrolyte derangement and hypernatremia.  Patient also was dehydrated and so he was given IV fluids.  He did have elevated ammonia level initially but this has normalized without intervention.  B12 and folate levels were normal.   His mental status back to baseline  Hyperammonemia Normalized without intervention.  History of portal vein thrombosis Chronically on rivaroxaban.  Was on Lovenox here due to poor oral intake.  Now back on rivaroxaban.  Acute kidney injury This was due to volume depletion and dehydration.  Resolved with IV fluids.    Polycythemia This appears to be  chronic.  Hypernatremia Secondary to dehydration.  Resolved with the D5 water.  Hypokalemia -Repleted  Dysphagia Speech therapy has been following.  He is on a dysphagia 1 diet.  We will have them to reevaluate to see if it is possible to advance  Diabetes mellitus type 2, uncontrolled with hyperglycemia, new diagnosis This appears to be a new diagnosis for him.  HbA1c 12.8.  CBG has been improving as steroid is stopped.  Lantus dose was decreased.  Monitor CBGs.    Hypokalemia Potassium has been repleted  DVT prophylaxis: On rivaroxaban. Code Status: DO NOT INTUBATE  Disposition Plan: SNF when insurance authorization is obtained, as discussed with social worker possibly Monday or Tuesday Consultants:  PCCM, signed off  Antimicrobials:  Anti-infectives (From admission, onward)   Start     Dose/Rate Route Frequency Ordered Stop   09/18/19 1000  remdesivir 100 mg in sodium chloride 0.9 % 250 mL IVPB     100 mg 500 mL/hr over 30 Minutes Intravenous Every 24 hours 09/17/19 1117 09/21/19 1055   09/17/19 1200  remdesivir 200 mg in sodium chloride 0.9 % 250 mL IVPB     200 mg 500 mL/hr over 30 Minutes Intravenous Once 09/17/19 1117 09/17/19 1249   09/17/19 0745  ceFEPIme (MAXIPIME) 2 g in sodium chloride 0.9 % 100 mL IVPB     2 g 200 mL/hr over 30 Minutes Intravenous  Once 09/17/19 0734 09/17/19 0837   09/17/19 0745  vancomycin (VANCOCIN) 1,750 mg in sodium chloride 0.9 % 500 mL IVPB     1,750 mg 250 mL/hr over 120 Minutes Intravenous  Once 09/17/19 0734 09/17/19 1104  Objective: Blood pressure (!) 118/92, pulse 96, temperature 98.3 F (36.8 C), temperature source Oral, resp. rate 13, height 5\' 11"  (1.803 m), weight 98.4 kg, SpO2 100 %.  Intake/Output Summary (Last 24 hours) at 10/05/2019 1042 Last data filed at 10/05/2019 0409 Gross per 24 hour  Intake 120 ml  Output 800 ml  Net -680 ml   Filed Weights   09/17/19 0822 09/17/19 2200  Weight: (S) 81.6 kg 98.4 kg     Examination:  Awake, oriented, pleasant Good air entry bilaterally Regular rate and rhythm, no murmur Abdomen soft, nondistended, bowel sounds present Extremity with no edema or cyanosis     CBC: Recent Labs  Lab 10/03/19 0224  WBC 6.7  HGB 17.5*  HCT 52.9*  MCV 86.2  PLT 494   Basic Metabolic Panel: Recent Labs  Lab 10/03/19 0224  NA 138  K 3.3*  CL 101  CO2 27  GLUCOSE 120*  BUN 11  CREATININE 1.17  CALCIUM 9.1   GFR: Estimated Creatinine Clearance: 83.3 mL/min (by C-G formula based on SCr of 1.17 mg/dL).  Liver Function Tests: No results for input(s): AST, ALT, ALKPHOS, BILITOT, PROT, ALBUMIN in the last 168 hours.   HbA1C: Hgb A1c MFr Bld  Date/Time Value Ref Range Status  09/17/2019 12:42 PM 12.8 (H) 4.8 - 5.6 % Final    Comment:    (NOTE) Pre diabetes:          5.7%-6.4% Diabetes:              >6.4% Glycemic control for   <7.0% adults with diabetes     CBG: Recent Labs  Lab 10/04/19 0750 10/04/19 1131 10/04/19 1618 10/04/19 2009 10/05/19 0857  GLUCAP 128* 211* 110* 153* 112*    No results found for this or any previous visit (from the past 240 hour(s)).   Scheduled Meds: . Chlorhexidine Gluconate Cloth  6 each Topical Daily  . famotidine  20 mg Oral Daily  . insulin aspart  0-15 Units Subcutaneous TID WC  . insulin aspart  0-5 Units Subcutaneous QHS  . insulin glargine  10 Units Subcutaneous Daily  . mouth rinse  15 mL Mouth Rinse BID  . metoprolol tartrate  25 mg Oral BID  . multivitamin with minerals  1 tablet Oral Daily  . rivaroxaban  20 mg Oral Q supper  . thiamine  100 mg Oral Daily    LOS: 18 days   Phillips Climes MD 10/05/2019  [facility] Office  [phone] Pager - Text Page per Shea Evans  If 7PM-7AM, please contact night-coverage per Amion 10/05/2019, 10:42 AM

## 2019-10-06 DIAGNOSIS — G9341 Metabolic encephalopathy: Secondary | ICD-10-CM | POA: Diagnosis not present

## 2019-10-06 LAB — GLUCOSE, CAPILLARY
Glucose-Capillary: 161 mg/dL — ABNORMAL HIGH (ref 70–99)
Glucose-Capillary: 115 mg/dL — ABNORMAL HIGH (ref 70–99)
Glucose-Capillary: 226 mg/dL — ABNORMAL HIGH (ref 70–99)
Glucose-Capillary: 101 mg/dL — ABNORMAL HIGH (ref 70–99)
Glucose-Capillary: 186 mg/dL — ABNORMAL HIGH (ref 70–99)

## 2019-10-06 NOTE — Progress Notes (Signed)
Maxwell Miller  NUU:[id number] DOB: Aug 21, 1961 DOA: 09/17/2019 PCP: Mable Paris, PA-C    Brief Narrative:  58 year old [place] [facility] resident with a history of vascular dementia, nontraumatic intracerebral hemorrhage, portal vein thrombosis on chronic Xarelto, and a diagnosis of COVID-19 10/28 who presented to the ED with 24 hours of severely elevated blood sugars, fever to 105, and altered mental status.  The patient is reportedly conversant at baseline.  Significant Events: 11/5 admit to [facility] ICU via [facility] long ED 11/6 transfer to progressive care unit  COVID-19 specific Treatment: Remdesivir 11/5 > Decadron 11/5 >  Subjective: No significant events overnight as discussed with staff, patient himself denies any complaints  Assessment & Plan:  Pneumonia due to COVID-19  Lab Results  Component Value Date   SARSCOV2NAA POSITIVE (A) 09/17/2019   SARSCOV2NAA NOT DETECTED 03/30/2019   SARSCOV2NAA NOT DETECTED 03/06/2019    Patient presented with high fever with a temperature of 105.4 F.  He was encephalopathic.  He has completed course of remdesivir.  Completed steroids as well. -No hypoxia, dyspnea or respiratory distress  SIRS/lactic acidosis Due to the above.  Now resolved.  Acute metabolic encephalopathy -Patient with underlying baseline vascular dementia at baseline. No acute findings on CT head.  This was thought to be mainly in the setting of acute COVID-19 infection, electrolyte derangement and hypernatremia.  Patient also was dehydrated and so he was given IV fluids.  He did have elevated ammonia level initially but this has normalized without intervention.  B12 and folate levels were normal.   His mental status back to baseline  Hyperammonemia Normalized without intervention.  History of portal vein thrombosis Chronically on rivaroxaban.  Was on Lovenox here due to poor oral intake.  Now back on rivaroxaban.  Acute kidney injury This was due  to volume depletion and dehydration.  Resolved with IV fluids.    Polycythemia This appears to be chronic.  Hypernatremia Secondary to dehydration.  Resolved with the D5 water.  Hypokalemia -Repleted  Dysphagia Speech therapy has been following.  He is on a dysphagia 1 diet.  We will have them to reevaluate to see if it is possible to advance  Diabetes mellitus type 2, uncontrolled with hyperglycemia, new diagnosis This appears to be a new diagnosis for him.  HbA1c 12.8.  CBG has been improving as steroid is stopped.  Lantus dose was decreased.  Monitor CBGs.    Hypokalemia Potassium has been repleted  DVT prophylaxis: On rivaroxaban. Code Status: DO NOT INTUBATE  Disposition Plan: SNF once bed is available Consultants:  PCCM, signed off  Antimicrobials:  Anti-infectives (From admission, onward)   Start     Dose/Rate Route Frequency Ordered Stop   09/18/19 1000  remdesivir 100 mg in sodium chloride 0.9 % 250 mL IVPB     100 mg 500 mL/hr over 30 Minutes Intravenous Every 24 hours 09/17/19 1117 09/21/19 1055   09/17/19 1200  remdesivir 200 mg in sodium chloride 0.9 % 250 mL IVPB     200 mg 500 mL/hr over 30 Minutes Intravenous Once 09/17/19 1117 09/17/19 1249   09/17/19 0745  ceFEPIme (MAXIPIME) 2 g in sodium chloride 0.9 % 100 mL IVPB     2 g 200 mL/hr over 30 Minutes Intravenous  Once 09/17/19 0734 09/17/19 0837   09/17/19 0745  vancomycin (VANCOCIN) 1,750 mg in sodium chloride 0.9 % 500 mL IVPB     1,750 mg 250 mL/hr over 120 Minutes Intravenous  Once 09/17/19 0734 09/17/19  1104       Objective: Blood pressure 134/88, pulse 84, temperature 98.7 F (37.1 C), temperature source Oral, resp. rate 19, height 5\' 11"  (1.803 m), weight 98.4 kg, SpO2 100 %.  Intake/Output Summary (Last 24 hours) at 10/06/2019 1617 Last data filed at 10/06/2019 0300 Gross per 24 hour  Intake --  Output 700 ml  Net -700 ml   Filed Weights   09/17/19 0822 09/17/19 2200  Weight: (S) 81.6  kg 98.4 kg    Examination:  awake, oriented x1, pleasant, communicative, diminished insight and cognition  Good air entry entry bilaterally, clear to auscultation Regular rate and rhythm, no rubs murmurs gallops Abdomen soft, nontender, nondistended bowel sounds present Extremities with no edema, clubbing or cyanosis     CBC: Recent Labs  Lab 10/03/19 0224  WBC 6.7  HGB 17.5*  HCT 52.9*  MCV 86.2  PLT 696   Basic Metabolic Panel: Recent Labs  Lab 10/03/19 0224  NA 138  K 3.3*  CL 101  CO2 27  GLUCOSE 120*  BUN 11  CREATININE 1.17  CALCIUM 9.1   GFR: Estimated Creatinine Clearance: 83.3 mL/min (by C-G formula based on SCr of 1.17 mg/dL).  Liver Function Tests: No results for input(s): AST, ALT, ALKPHOS, BILITOT, PROT, ALBUMIN in the last 168 hours.   HbA1C: Hgb A1c MFr Bld  Date/Time Value Ref Range Status  09/17/2019 12:42 PM 12.8 (H) 4.8 - 5.6 % Final    Comment:    (NOTE) Pre diabetes:          5.7%-6.4% Diabetes:              >6.4% Glycemic control for   <7.0% adults with diabetes     CBG: Recent Labs  Lab 10/05/19 1208 10/05/19 1539 10/05/19 2054 10/06/19 0814 10/06/19 1201  GLUCAP 249* 121* 161* 115* 226*    No results found for this or any previous visit (from the past 240 hour(s)).   Scheduled Meds:  Chlorhexidine Gluconate Cloth  6 each Topical Daily   famotidine  20 mg Oral Daily   insulin aspart  0-15 Units Subcutaneous TID WC   insulin aspart  0-5 Units Subcutaneous QHS   insulin glargine  10 Units Subcutaneous Daily   mouth rinse  15 mL Mouth Rinse BID   metoprolol tartrate  25 mg Oral BID   multivitamin with minerals  1 tablet Oral Daily   rivaroxaban  20 mg Oral Q supper   thiamine  100 mg Oral Daily    LOS: 19 days   Phillips Climes MD 10/06/2019  [facility] Office  [phone] Pager - Text Page per Shea Evans  If 7PM-7AM, please contact night-coverage per Amion 10/06/2019, 4:17 PM

## 2019-10-06 NOTE — Progress Notes (Signed)
Physical Therapy Treatment Patient Details Name: Maxwell Miller MRN: [id number] DOB: 1960/12/19 Today's Date: 10/06/2019    History of Present Illness 59 y/o w/ hx of vascular dementia, CVA, portal vein thrombosis, non traumatic intracerebral hemorrhage, HLD, HTN, COVID 19    PT Comments    Pt making steady progress with therapy, this session was limited by pain in R knee, but he is unable to qualify pain nor tell what pain is from. When asked he states he does not know how he would know this information. Other than this pt is at [facility] guard assist with mobility this pm, was able to ambulate approx 223ft with HHA. Pt was on room air throughout session w/ no frank distress or increased work of breathing noted.    Follow Up Recommendations  SNF     Equipment Recommendations  None recommended by PT    Recommendations for Other Services       Precautions / Restrictions Precautions Precautions: Fall Restrictions Weight Bearing Restrictions: No    Mobility  Bed Mobility Overal bed mobility: Modified Independent                Transfers Overall transfer level: Needs assistance Equipment used: None Transfers: Sit to/from Stand Sit to Stand: Supervision            Ambulation/Gait Ambulation/Gait assistance: Min guard Gait Distance (Feet): 200 Feet Assistive device: 1 person hand held assist Gait Pattern/deviations: Step-through pattern Gait velocity: fair   General Gait Details: noted limping again and again states that has pain in R knee, he is unable to tell what this is from and is unable to qualify pain, when asked he states he does not knwo how it hurts or even how or if he injured this leg in the past   Stairs             Wheelchair Mobility    Modified Rankin (Stroke Patients Only)       Balance Overall balance assessment: Needs assistance Sitting-balance support: Feet unsupported Sitting balance-Leahy Scale: Good     Standing  balance support: During functional activity Standing balance-Leahy Scale: Fair                              Cognition Arousal/Alertness: Awake/alert Behavior During Therapy: WFL for tasks assessed/performed Overall Cognitive Status: No family/caregiver present to determine baseline cognitive functioning                                 General Comments: seems to be improving daily cognitively, becomes more witty and able to recall more of his past      Exercises      General Comments        Pertinent Vitals/Pain Pain Assessment: Faces Faces Pain Scale: Hurts even more Pain Location: R knee w/ ambulation Pain Descriptors / Indicators: Guarding;Grimacing Pain Intervention(s): Limited activity within patient's tolerance    Home Living                      Prior Function            PT Goals (current goals can now be found in the care plan section) Acute Rehab PT Goals Time For Goal Achievement: 10/09/19 Potential to Achieve Goals: Good Progress towards PT goals: Progressing toward goals    Frequency    Min 2X/week  PT Plan Current plan remains appropriate    Co-evaluation              AM-PAC PT "6 Clicks" Mobility   Outcome Measure  Help needed turning from your back to your side while in a flat bed without using bedrails?: None Help needed moving from lying on your back to sitting on the side of a flat bed without using bedrails?: None Help needed moving to and from a bed to a chair (including a wheelchair)?: None Help needed standing up from a chair using your arms (e.g., wheelchair or bedside chair)?: A Little Help needed to walk in hospital room?: A Little Help needed climbing 3-5 steps with a railing? : A Lot 6 Click Score: 20    End of Session   Activity Tolerance: Patient limited by pain Patient left: in bed;with call bell/phone within reach Nurse Communication: Mobility status PT Visit Diagnosis: Other  abnormalities of gait and mobility (R26.89);Muscle weakness (generalized) (M62.81)     Time: 1350-1404 PT Time Calculation (min) (ACUTE ONLY): 14 min  Charges:  $Gait Training: 8-22 mins                     Horald Chestnut, PT    Delford Field 10/06/2019, 5:12 PM

## 2019-10-06 NOTE — Progress Notes (Signed)
  Speech Language Pathology Treatment: Dysphagia  Patient Details Name: Maxwell Miller MRN: [id number] DOB: 12/22/1960 Today's Date: 10/06/2019 Time: 0100-7121 SLP Time Calculation (min) (ACUTE ONLY): 10 min  Assessment / Plan / Recommendation Clinical Impression  Pt seen with lunch meal. Need a little verbal encouragement to get started eating, but fed himself regualr testures without difficulty. No SLP f/u needed will sign off.   HPI HPI: Pt is a 58 yo male admitted from SNF with fever and AMS, positive for COVID-19. PMH: dementia, ICH      SLP Plan  All goals met       Recommendations  Diet recommendations: Regular;Thin liquid Liquids provided via: Cup;Straw Medication Administration: Whole meds with liquid Supervision: Patient able to self feed                Oral Care Recommendations: Oral care BID Follow up Recommendations: 24 hour supervision/assistance SLP Visit Diagnosis: Dysphagia, unspecified (R13.10) Plan: All goals met       GO               Maxwell Baltimore, MA CCC-SLP  Acute Rehabilitation Services Pager [phone] Office [phone]  Maxwell Miller 10/06/2019, 1:49 PM

## 2019-10-07 DIAGNOSIS — G9341 Metabolic encephalopathy: Secondary | ICD-10-CM | POA: Diagnosis not present

## 2019-10-07 LAB — GLUCOSE, CAPILLARY
Glucose-Capillary: 109 mg/dL — ABNORMAL HIGH (ref 70–99)
Glucose-Capillary: 197 mg/dL — ABNORMAL HIGH (ref 70–99)
Glucose-Capillary: 114 mg/dL — ABNORMAL HIGH (ref 70–99)
Glucose-Capillary: 151 mg/dL — ABNORMAL HIGH (ref 70–99)

## 2019-10-07 LAB — SARS CORONAVIRUS 2 (TAT 6-24 HRS): SARS Coronavirus 2: POSITIVE — AB

## 2019-10-07 NOTE — Progress Notes (Signed)
Maxwell Miller  POE:[id number] DOB: 06/27/1961 DOA: 09/17/2019 PCP: Mable Paris, PA-C    Brief Narrative:  58 year old [place] [facility] resident with a history of vascular dementia, nontraumatic intracerebral hemorrhage, portal vein thrombosis on chronic Xarelto, and a diagnosis of COVID-19 10/28 who presented to the ED with 24 hours of severely elevated blood sugars, fever to 105, and altered mental status.  The patient is reportedly conversant at baseline.  Significant Events: 11/5 admit to [facility] ICU via [facility] long ED 11/6 transfer to progressive care unit  COVID-19 specific Treatment: Remdesivir 11/5 > Decadron 11/5 >  Subjective: Patient denies any complaints, no significant overnight as discussed with staff  Assessment & Plan:  Pneumonia due to COVID-19  Lab Results  Component Value Date   SARSCOV2NAA POSITIVE (A) 09/17/2019   SARSCOV2NAA NOT DETECTED 03/30/2019   SARSCOV2NAA NOT DETECTED 03/06/2019    Patient presented with high fever with a temperature of 105.4 F.  He was encephalopathic.  He has completed course of remdesivir.  Completed steroids as well. -No hypoxia, dyspnea or respiratory distress  SIRS/lactic acidosis Due to the above.  Now resolved.  Acute metabolic encephalopathy -Patient with underlying baseline vascular dementia at baseline. No acute findings on CT head.  This was thought to be mainly in the setting of acute COVID-19 infection, electrolyte derangement and hypernatremia.  Patient also was dehydrated and so he was given IV fluids.  He did have elevated ammonia level initially but this has normalized without intervention.  B12 and folate levels were normal.   His mental status back to baseline  Hyperammonemia Normalized without intervention.  History of portal vein thrombosis Chronically on rivaroxaban.  Was on Lovenox here due to poor oral intake.  Now back on rivaroxaban.  Acute kidney injury This was due to volume  depletion and dehydration.  Resolved with IV fluids.    Polycythemia This appears to be chronic.  Hypernatremia Secondary to dehydration.  Resolved with the D5 water.  Hypokalemia -Repleted  Dysphagia Resolved, SLP input appreciated  Diabetes mellitus type 2, uncontrolled with hyperglycemia, new diagnosis This appears to be a new diagnosis for him.  HbA1c 12.8.  CBG has been improving as steroid is stopped.  Lantus dose was decreased.  Monitor CBGs.    Hypokalemia Potassium has been repleted  DVT prophylaxis: On rivaroxaban. Code Status: DO NOT INTUBATE  Disposition Plan: SNF once bed is available, will obtain repeat COVID-19 test for placement per social worker consult. Consultants:  PCCM, signed off  Antimicrobials:  Anti-infectives (From admission, onward)   Start     Dose/Rate Route Frequency Ordered Stop   09/18/19 1000  remdesivir 100 mg in sodium chloride 0.9 % 250 mL IVPB     100 mg 500 mL/hr over 30 Minutes Intravenous Every 24 hours 09/17/19 1117 09/21/19 1055   09/17/19 1200  remdesivir 200 mg in sodium chloride 0.9 % 250 mL IVPB     200 mg 500 mL/hr over 30 Minutes Intravenous Once 09/17/19 1117 09/17/19 1249   09/17/19 0745  ceFEPIme (MAXIPIME) 2 g in sodium chloride 0.9 % 100 mL IVPB     2 g 200 mL/hr over 30 Minutes Intravenous  Once 09/17/19 0734 09/17/19 0837   09/17/19 0745  vancomycin (VANCOCIN) 1,750 mg in sodium chloride 0.9 % 500 mL IVPB     1,750 mg 250 mL/hr over 120 Minutes Intravenous  Once 09/17/19 0734 09/17/19 1104       Objective: Blood pressure 125/88, pulse 81, temperature 98.6  F (37 C), temperature source Oral, resp. rate 18, height 5\' 11"  (1.803 m), weight 98.4 kg, SpO2 97 %.  Intake/Output Summary (Last 24 hours) at 10/07/2019 1510 Last data filed at 10/07/2019 1332 Gross per 24 hour  Intake 720 ml  Output -  Net 720 ml   Filed Weights   09/17/19 0822 09/17/19 2200  Weight: (S) 81.6 kg 98.4 kg    Examination:  Awake  Alert, Oriented X 1, pleasant, follow commands, impaired cognition and insight Symmetrical Chest wall movement, Good air movement bilaterally, CTAB RRR,No Gallops,Rubs or new Murmurs, No Parasternal Heave +ve B.Sounds, Abd Soft, No tenderness, No rebound - guarding or rigidity. No Cyanosis, Clubbing or edema, No new Rash or bruise        CBC: Recent Labs  Lab 10/03/19 0224  WBC 6.7  HGB 17.5*  HCT 52.9*  MCV 86.2  PLT 834   Basic Metabolic Panel: Recent Labs  Lab 10/03/19 0224  NA 138  K 3.3*  CL 101  CO2 27  GLUCOSE 120*  BUN 11  CREATININE 1.17  CALCIUM 9.1   GFR: Estimated Creatinine Clearance: 82.3 mL/min (by C-G formula based on SCr of 1.17 mg/dL).  Liver Function Tests: No results for input(s): AST, ALT, ALKPHOS, BILITOT, PROT, ALBUMIN in the last 168 hours.   HbA1C: Hgb A1c MFr Bld  Date/Time Value Ref Range Status  09/17/2019 12:42 PM 12.8 (H) 4.8 - 5.6 % Final    Comment:    (NOTE) Pre diabetes:          5.7%-6.4% Diabetes:              >6.4% Glycemic control for   <7.0% adults with diabetes     CBG: Recent Labs  Lab 10/06/19 1201 10/06/19 1706 10/06/19 2115 10/07/19 0803 10/07/19 1147  GLUCAP 226* 101* 186* 109* 197*    No results found for this or any previous visit (from the past 240 hour(s)).   Scheduled Meds: . famotidine  20 mg Oral Daily  . insulin aspart  0-15 Units Subcutaneous TID WC  . insulin aspart  0-5 Units Subcutaneous QHS  . insulin glargine  10 Units Subcutaneous Daily  . mouth rinse  15 mL Mouth Rinse BID  . metoprolol tartrate  25 mg Oral BID  . multivitamin with minerals  1 tablet Oral Daily  . rivaroxaban  20 mg Oral Q supper  . thiamine  100 mg Oral Daily    LOS: 20 days   Phillips Climes MD 10/07/2019  [facility] Office  [phone] Pager - Text Page per Shea Evans  If 7PM-7AM, please contact night-coverage per Amion 10/07/2019, 3:10 PM

## 2019-10-08 DIAGNOSIS — G9341 Metabolic encephalopathy: Secondary | ICD-10-CM | POA: Diagnosis not present

## 2019-10-08 LAB — GLUCOSE, CAPILLARY
Glucose-Capillary: 111 mg/dL — ABNORMAL HIGH (ref 70–99)
Glucose-Capillary: 165 mg/dL — ABNORMAL HIGH (ref 70–99)
Glucose-Capillary: 189 mg/dL — ABNORMAL HIGH (ref 70–99)
Glucose-Capillary: 81 mg/dL (ref 70–99)
Glucose-Capillary: 184 mg/dL — ABNORMAL HIGH (ref 70–99)

## 2019-10-08 NOTE — Progress Notes (Signed)
Maxwell Miller  ENI:[id number] DOB: Aug 30, 1961 DOA: 09/17/2019 PCP: Mable Paris, PA-C    Brief Narrative:  58 year old [place] [facility] resident with a history of vascular dementia, nontraumatic intracerebral hemorrhage, portal vein thrombosis on chronic Xarelto, and a diagnosis of COVID-19 10/28 who presented to the ED with 24 hours of severely elevated blood sugars, fever to 105, and altered mental status.  The patient is reportedly conversant at baseline.  Significant Events: 11/5 admit to [facility] ICU via [facility] long ED 11/6 transfer to progressive care unit  COVID-19 specific Treatment: Remdesivir 11/5 > Decadron 11/5 >  Subjective: Patient denies any complaints, no significant overnight as discussed with staff  Assessment & Plan:  Pneumonia due to COVID-19  Lab Results  Component Value Date   SARSCOV2NAA POSITIVE (A) 10/07/2019   SARSCOV2NAA POSITIVE (A) 09/17/2019   SARSCOV2NAA NOT DETECTED 03/30/2019   SARSCOV2NAA NOT DETECTED 03/06/2019    Patient presented with high fever with a temperature of 105.4 F.  He was encephalopathic.  He has completed course of remdesivir.  Completed steroids as well. -No hypoxia, dyspnea or respiratory distress  SIRS/lactic acidosis Due to the above.  Now resolved.  Acute metabolic encephalopathy -Patient with underlying baseline vascular dementia at baseline. No acute findings on CT head.  This was thought to be mainly in the setting of acute COVID-19 infection, electrolyte derangement and hypernatremia.  Patient also was dehydrated and so he was given IV fluids.  He did have elevated ammonia level initially but this has normalized without intervention.  B12 and folate levels were normal.   His mental status back to baseline  Hyperammonemia Normalized without intervention.  History of portal vein thrombosis Chronically on rivaroxaban.  Was on Lovenox here due to poor oral intake.  Now back on rivaroxaban.  Acute  kidney injury This was due to volume depletion and dehydration.  Resolved with IV fluids.    Polycythemia This appears to be chronic.  Hypernatremia Secondary to dehydration.  Resolved with the D5 water.  Hypokalemia -Repleted  Dysphagia Resolved, SLP input appreciated  Diabetes mellitus type 2, uncontrolled with hyperglycemia, new diagnosis This appears to be a new diagnosis for him.  HbA1c 12.8.  CBG has been improving as steroid is stopped.  Lantus dose was decreased.  Monitor CBGs.    Hypokalemia Potassium has been repleted  DVT prophylaxis: On rivaroxaban. Code Status: DO NOT INTUBATE  Disposition Plan: SNF once bed is available, social worker went to find placement, repeat COVID-19 test yesterday remains positive consultants:  PCCM, signed off  Antimicrobials:  Anti-infectives (From admission, onward)   Start     Dose/Rate Route Frequency Ordered Stop   09/18/19 1000  remdesivir 100 mg in sodium chloride 0.9 % 250 mL IVPB     100 mg 500 mL/hr over 30 Minutes Intravenous Every 24 hours 09/17/19 1117 09/21/19 1055   09/17/19 1200  remdesivir 200 mg in sodium chloride 0.9 % 250 mL IVPB     200 mg 500 mL/hr over 30 Minutes Intravenous Once 09/17/19 1117 09/17/19 1249   09/17/19 0745  ceFEPIme (MAXIPIME) 2 g in sodium chloride 0.9 % 100 mL IVPB     2 g 200 mL/hr over 30 Minutes Intravenous  Once 09/17/19 0734 09/17/19 0837   09/17/19 0745  vancomycin (VANCOCIN) 1,750 mg in sodium chloride 0.9 % 500 mL IVPB     1,750 mg 250 mL/hr over 120 Minutes Intravenous  Once 09/17/19 0734 09/17/19 1104       Objective:  Blood pressure (!) 141/92, pulse 68, temperature 97.9 F (36.6 C), temperature source Oral, resp. rate 20, height 5\' 11"  (1.803 m), weight 98.4 kg, SpO2 100 %. No intake or output data in the 24 hours ending 10/08/19 1514 Filed Weights   09/17/19 0822 09/17/19 2200  Weight: (S) 81.6 kg 98.4 kg    Examination:  Awake alert x1, impaired cognition and insight  Symmetrical chest movement bilaterally RRR, Abdomen soft Extremities with no edema      CBC: Recent Labs  Lab 10/03/19 0224  WBC 6.7  HGB 17.5*  HCT 52.9*  MCV 86.2  PLT 229   Basic Metabolic Panel: Recent Labs  Lab 10/03/19 0224  NA 138  K 3.3*  CL 101  CO2 27  GLUCOSE 120*  BUN 11  CREATININE 1.17  CALCIUM 9.1   GFR: Estimated Creatinine Clearance: 82.3 mL/min (by C-G formula based on SCr of 1.17 mg/dL).  Liver Function Tests: No results for input(s): AST, ALT, ALKPHOS, BILITOT, PROT, ALBUMIN in the last 168 hours.   HbA1C: Hgb A1c MFr Bld  Date/Time Value Ref Range Status  09/17/2019 12:42 PM 12.8 (H) 4.8 - 5.6 % Final    Comment:    (NOTE) Pre diabetes:          5.7%-6.4% Diabetes:              >6.4% Glycemic control for   <7.0% adults with diabetes     CBG: Recent Labs  Lab 10/07/19 1629 10/07/19 2057 10/08/19 0822 10/08/19 1201 10/08/19 1228  GLUCAP 114* 151* 111* 189* 165*    Recent Results (from the past 240 hour(s))  SARS CORONAVIRUS 2 (TAT 6-24 HRS) Nasopharyngeal Nasopharyngeal Swab     Status: Abnormal   Collection Time: 10/07/19  1:00 PM   Specimen: Nasopharyngeal Swab  Result Value Ref Range Status   SARS Coronavirus 2 POSITIVE (A) NEGATIVE Final    Comment: RESULT CALLED TO, READ BACK BY AND VERIFIED WITH: K KOLB,RN 1856 10/07/2019 D BRADLEY (NOTE) SARS-CoV-2 target nucleic acids are DETECTED. The SARS-CoV-2 RNA is generally detectable in upper and lower respiratory specimens during the acute phase of infection. Positive results are indicative of the presence of SARS-CoV-2 RNA. Clinical correlation with patient history and other diagnostic information is  necessary to determine patient infection status. Positive results do not rule out bacterial infection or co-infection with other viruses.  The expected result is Negative. Fact Sheet for Patients: HairSlick.nohttps://www.fda.gov/media/138098/download Fact Sheet for Healthcare  Providers: quierodirigir.comhttps://www.fda.gov/media/138095/download This test is not yet approved or cleared by the [place] FDA and  has been authorized for detection and/or diagnosis of SARS-CoV-2 by FDA under an Emergency Use Authorization (EUA). This EUA will remain  in effect (meaning this test can be used) for the  duration of the COVID-19 declaration under Section 564(b)(1) of the Act, 21 U.S.C. section 360bbb-3(b)(1), unless the authorization is terminated or revoked sooner. Performed at [facility], [address] [place], [place] [address]      Scheduled Meds: . famotidine  20 mg Oral Daily  . insulin aspart  0-15 Units Subcutaneous TID WC  . insulin aspart  0-5 Units Subcutaneous QHS  . insulin glargine  10 Units Subcutaneous Daily  . mouth rinse  15 mL Mouth Rinse BID  . metoprolol tartrate  25 mg Oral BID  . multivitamin with minerals  1 tablet Oral Daily  . rivaroxaban  20 mg Oral Q supper  . thiamine  100 mg Oral Daily  LOS: 21 days   Phillips Climes MD 10/08/2019  [facility] Office  [phone] Pager - Text Page per Shea Evans  If 7PM-7AM, please contact night-coverage per Amion 10/08/2019, 3:14 PM

## 2019-10-09 DIAGNOSIS — G9341 Metabolic encephalopathy: Secondary | ICD-10-CM | POA: Diagnosis not present

## 2019-10-09 LAB — GLUCOSE, CAPILLARY
Glucose-Capillary: 116 mg/dL — ABNORMAL HIGH (ref 70–99)
Glucose-Capillary: 103 mg/dL — ABNORMAL HIGH (ref 70–99)
Glucose-Capillary: 136 mg/dL — ABNORMAL HIGH (ref 70–99)
Glucose-Capillary: 143 mg/dL — ABNORMAL HIGH (ref 70–99)

## 2019-10-09 NOTE — Progress Notes (Signed)
Physical Therapy Treatment Patient Details Name: Maxwell Miller MRN: [id number] DOB: December 03, 1960 Today's Date: 10/09/2019    History of Present Illness 58 y/o w/ hx of vascular dementia, CVA, portal vein thrombosis, non traumatic intracerebral hemorrhage, HLD, HTN, COVID 19    PT Comments    Pt has a lot of questions today about mobility and prognosis also his d/c plans. Attempted to educate on all this but pt states he is still confused. He stated he wanted to know about his family, if he has a wife and son but when therapist found numbers in chart pt declined to talk to them stating he didn't have anything to tell them. D/C disposition still valid pt may d/c home to SNF when medically able.    Follow Up Recommendations  SNF     Equipment Recommendations  None recommended by PT    Recommendations for Other Services       Precautions / Restrictions Precautions Precautions: Fall Restrictions Weight Bearing Restrictions: No    Mobility  Bed Mobility Overal bed mobility: Modified Independent                Transfers Overall transfer level: Needs assistance Equipment used: None Transfers: Sit to/from Stand Sit to Stand: Supervision Stand pivot transfers: Min guard          Ambulation/Gait                 Stairs             Wheelchair Mobility    Modified Rankin (Stroke Patients Only)       Balance Overall balance assessment: Needs assistance Sitting-balance support: Feet unsupported Sitting balance-Leahy Scale: Good     Standing balance support: During functional activity Standing balance-Leahy Scale: Fair                              Cognition Arousal/Alertness: Awake/alert Behavior During Therapy: WFL for tasks assessed/performed Overall Cognitive Status: No family/caregiver present to determine baseline cognitive functioning                                        Exercises      General  Comments        Pertinent Vitals/Pain Pain Assessment: Faces Faces Pain Scale: Hurts little more    Home Living                      Prior Function            PT Goals (current goals can now be found in the care plan section) Acute Rehab PT Goals Time For Goal Achievement: 10/09/19 Potential to Achieve Goals: Poor Progress towards PT goals: Progressing toward goals    Frequency    Min 2X/week      PT Plan Current plan remains appropriate    Co-evaluation              AM-PAC PT "6 Clicks" Mobility   Outcome Measure  Help needed turning from your back to your side while in a flat bed without using bedrails?: None Help needed moving from lying on your back to sitting on the side of a flat bed without using bedrails?: None Help needed moving to and from a bed to a chair (including a wheelchair)?: None Help needed standing up from a chair using  your arms (e.g., wheelchair or bedside chair)?: A Little Help needed to walk in hospital room?: A Little Help needed climbing 3-5 steps with a railing? : A Lot 6 Click Score: 20    End of Session   Activity Tolerance: Patient limited by pain Patient left: in bed;with call bell/phone within reach Nurse Communication: Mobility status PT Visit Diagnosis: Other abnormalities of gait and mobility (R26.89);Muscle weakness (generalized) (M62.81)     Time: 9179-1505 PT Time Calculation (min) (ACUTE ONLY): 17 min  Charges:  $Therapeutic Activity: 8-22 mins                     Drema Pry, PT    [name] 10/09/2019, 4:02 PM

## 2019-10-09 NOTE — Progress Notes (Addendum)
Maxwell Miller  PPJ:[id number] DOB: June 05, 1961 DOA: 09/17/2019 PCP: Mable Paris, PA-C    Brief Narrative:  58 year old [place] [facility] resident with a history of vascular dementia, nontraumatic intracerebral hemorrhage, portal vein thrombosis on chronic Xarelto, and a diagnosis of COVID-19 10/28 who presented to the ED with 24 hours of severely elevated blood sugars, fever to 105, and altered mental status.  The patient is reportedly conversant at baseline.  Significant Events: 11/5 admit to [facility] ICU via [facility] long ED 11/6 transfer to progressive care unit  COVID-19 specific Treatment: Remdesivir 11/5 > Decadron 11/5 >  Subjective: Patient denies any complaints, no significant overnight as discussed with staff  Assessment & Plan:  Pneumonia due to COVID-19  Lab Results  Component Value Date   SARSCOV2NAA POSITIVE (A) 10/07/2019   SARSCOV2NAA POSITIVE (A) 09/17/2019   SARSCOV2NAA NOT DETECTED 03/30/2019   SARSCOV2NAA NOT DETECTED 03/06/2019    Patient presented with high fever with a temperature of 105.4 F.  He was encephalopathic.  He has completed course of remdesivir.  Completed steroids as well. -No hypoxia, dyspnea or respiratory distress  SIRS/lactic acidosis Due to the above.  Now resolved.  Acute metabolic encephalopathy -Patient with underlying baseline vascular dementia at baseline. No acute findings on CT head.  This was thought to be mainly in the setting of acute COVID-19 infection, electrolyte derangement and hypernatremia.  Patient also was dehydrated and so he was given IV fluids.  He did have elevated ammonia level initially but this has normalized without intervention.  B12 and folate levels were normal.   His mental status back to baseline  Hyperammonemia Normalized without intervention.  History of portal vein thrombosis Chronically on rivaroxaban.  Was on Lovenox here due to poor oral intake.  Now back on rivaroxaban.  Acute  kidney injury This was due to volume depletion and dehydration.  Resolved with IV fluids.    Polycythemia This appears to be chronic.  Hypernatremia Secondary to dehydration.  Resolved with the D5 water.  Hypokalemia -Repleted  Dysphagia Resolved, SLP input appreciated  Diabetes mellitus type 2, uncontrolled with hyperglycemia, new diagnosis This appears to be a new diagnosis for him.  HbA1c 12.8.  CBG has been improving as steroid is stopped.  Lantus dose was decreased.  Monitor CBGs.    Hypokalemia Potassium has been repleted  DVT prophylaxis: On rivaroxaban. Code Status: DO NOT INTUBATE  Family communication: HCPOA niece, updated 11/25 Disposition Plan: SNF once bed is available, social worker went to find placement, repeat COVID-19 test 11/25 remains positive consultants:  PCCM, signed off  Antimicrobials:  Anti-infectives (From admission, onward)   Start     Dose/Rate Route Frequency Ordered Stop   09/18/19 1000  remdesivir 100 mg in sodium chloride 0.9 % 250 mL IVPB     100 mg 500 mL/hr over 30 Minutes Intravenous Every 24 hours 09/17/19 1117 09/21/19 1055   09/17/19 1200  remdesivir 200 mg in sodium chloride 0.9 % 250 mL IVPB     200 mg 500 mL/hr over 30 Minutes Intravenous Once 09/17/19 1117 09/17/19 1249   09/17/19 0745  ceFEPIme (MAXIPIME) 2 g in sodium chloride 0.9 % 100 mL IVPB     2 g 200 mL/hr over 30 Minutes Intravenous  Once 09/17/19 0734 09/17/19 0837   09/17/19 0745  vancomycin (VANCOCIN) 1,750 mg in sodium chloride 0.9 % 500 mL IVPB     1,750 mg 250 mL/hr over 120 Minutes Intravenous  Once 09/17/19 0734 09/17/19 1104  Objective: Blood pressure (!) 147/97, pulse 67, temperature 98.4 F (36.9 C), temperature source Oral, resp. rate 18, height 5\' 11"  (1.803 m), weight 98.4 kg, SpO2 100 %.  Intake/Output Summary (Last 24 hours) at 10/09/2019 1432 Last data filed at 10/09/2019 0920 Gross per 24 hour  Intake 980 ml  Output 1550 ml  Net -570 ml    Filed Weights   09/17/19 0822 09/17/19 2200  Weight: (S) 81.6 kg 98.4 kg    Examination:  Awake alert x1, impaired cognition and insight Symmetrical chest movement bilaterally Heart rate and rhythm Abdomen soft Extremities with no edema      CBC: Recent Labs  Lab 10/03/19 0224  WBC 6.7  HGB 17.5*  HCT 52.9*  MCV 86.2  PLT 229   Basic Metabolic Panel: Recent Labs  Lab 10/03/19 0224  NA 138  K 3.3*  CL 101  CO2 27  GLUCOSE 120*  BUN 11  CREATININE 1.17  CALCIUM 9.1   GFR: Estimated Creatinine Clearance: 82.3 mL/min (by C-G formula based on SCr of 1.17 mg/dL).  Liver Function Tests: No results for input(s): AST, ALT, ALKPHOS, BILITOT, PROT, ALBUMIN in the last 168 hours.   HbA1C: Hgb A1c MFr Bld  Date/Time Value Ref Range Status  09/17/2019 12:42 PM 12.8 (H) 4.8 - 5.6 % Final    Comment:    (NOTE) Pre diabetes:          5.7%-6.4% Diabetes:              >6.4% Glycemic control for   <7.0% adults with diabetes     CBG: Recent Labs  Lab 10/08/19 1228 10/08/19 1722 10/08/19 2153 10/09/19 0832 10/09/19 1307  GLUCAP 165* 81 184* 116* 103*    Recent Results (from the past 240 hour(s))  SARS CORONAVIRUS 2 (TAT 6-24 HRS) Nasopharyngeal Nasopharyngeal Swab     Status: Abnormal   Collection Time: 10/07/19  1:00 PM   Specimen: Nasopharyngeal Swab  Result Value Ref Range Status   SARS Coronavirus 2 POSITIVE (A) NEGATIVE Final    Comment: RESULT CALLED TO, READ BACK BY AND VERIFIED WITH: K KOLB,RN 1856 10/07/2019 D BRADLEY (NOTE) SARS-CoV-2 target nucleic acids are DETECTED. The SARS-CoV-2 RNA is generally detectable in upper and lower respiratory specimens during the acute phase of infection. Positive results are indicative of the presence of SARS-CoV-2 RNA. Clinical correlation with patient history and other diagnostic information is  necessary to determine patient infection status. Positive results do not rule out bacterial infection or  co-infection with other viruses.  The expected result is Negative. Fact Sheet for Patients: HairSlick.nohttps://www.fda.gov/media/138098/download Fact Sheet for Healthcare Providers: quierodirigir.comhttps://www.fda.gov/media/138095/download This test is not yet approved or cleared by the [place] FDA and  has been authorized for detection and/or diagnosis of SARS-CoV-2 by FDA under an Emergency Use Authorization (EUA). This EUA will remain  in effect (meaning this test can be used) for the  duration of the COVID-19 declaration under Section 564(b)(1) of the Act, 21 U.S.C. section 360bbb-3(b)(1), unless the authorization is terminated or revoked sooner. Performed at [facility], [address] [place], [place] [address]      Scheduled Meds: . famotidine  20 mg Oral Daily  . insulin aspart  0-15 Units Subcutaneous TID WC  . insulin aspart  0-5 Units Subcutaneous QHS  . insulin glargine  10 Units Subcutaneous Daily  . mouth rinse  15 mL Mouth Rinse BID  . metoprolol tartrate  25 mg Oral BID  . multivitamin  with minerals  1 tablet Oral Daily  . rivaroxaban  20 mg Oral Q supper  . thiamine  100 mg Oral Daily    LOS: 22 days   Phillips Climes MD 10/09/2019  [facility] Office  [phone] Pager - Text Page per Shea Evans  If 7PM-7AM, please contact night-coverage per Amion 10/09/2019, 2:32 PM

## 2019-10-10 DIAGNOSIS — J1289 Other viral pneumonia: Secondary | ICD-10-CM | POA: Diagnosis not present

## 2019-10-10 DIAGNOSIS — U071 COVID-19: Secondary | ICD-10-CM | POA: Diagnosis not present

## 2019-10-10 LAB — GLUCOSE, CAPILLARY
Glucose-Capillary: 107 mg/dL — ABNORMAL HIGH (ref 70–99)
Glucose-Capillary: 118 mg/dL — ABNORMAL HIGH (ref 70–99)
Glucose-Capillary: 130 mg/dL — ABNORMAL HIGH (ref 70–99)
Glucose-Capillary: 169 mg/dL — ABNORMAL HIGH (ref 70–99)

## 2019-10-10 MED ORDER — POTASSIUM CHLORIDE CRYS ER 20 MEQ PO TBCR
40.0000 meq | EXTENDED_RELEASE_TABLET | Freq: Once | ORAL | Status: AC
  Administered 2019-10-10: 40 meq via ORAL
  Filled 2019-10-10: qty 2

## 2019-10-10 NOTE — Progress Notes (Signed)
Maxwell Miller  [id number] DOB: 11-24-1960 DOA: 09/17/2019 PCP: Mable ParisWilliams, Katrina, PA-C    Brief Narrative:  58 [facility] old [place] [facility] resident with a history of vascular dementia, nontraumatic intracerebral hemorrhage, portal vein thrombosis on chronic Xarelto, and a diagnosis of COVID-19 10/28 who presented to the ED with 24 hours of severely elevated blood sugars, fever to 105, and altered mental status.  The patient is reportedly conversant at baseline.  Significant Events: 11/5 [facility] long ED 11/6 transfer to progressive care unit  COVID-19 specific Treatment: Remdesivir 11/5 > Decadron 11/5 >    Subjective:  Patient in bed, appears comfortable, denies any headache, no fever, no chest pain or pressure, no shortness of breath , no abdominal pain.    Assessment & Plan:  Pneumonia due to COVID-19  Lab Results  Component Value Date   SARSCOV2NAA POSITIVE (A) 10/07/2019   SARSCOV2NAA POSITIVE (A) 09/17/2019   SARSCOV2NAA NOT DETECTED 03/30/2019   SARSCOV2NAA NOT DETECTED 03/06/2019    Patient presented with high fever with a temperature of 105.4 F.  He was encephalopathic.  He has completed course of remdesivir.  Completed steroids as well.  This problem has resolved clinically and he is symptom-free from the standpoint.   SIRS/lactic acidosis Due to the above.  Now resolved.  Acute metabolic encephalopathy -Patient with underlying baseline vascular dementia at baseline. No acute findings on CT head.  This was thought to be mainly in the setting of acute COVID-19 infection, electrolyte derangement and hypernatremia.  Patient also was dehydrated and so he was given IV fluids.  He did have elevated ammonia level initially but this has normalized without intervention.  B12 and folate levels were normal.  He is back to his baseline with baseline dementia and pleasant confusion.  Hyperammonemia - Normalized without intervention.   History of portal vein thrombosis - Chronically on rivaroxaban.  Was on Lovenox here due to poor oral intake.  Now back on rivaroxaban.  Acute kidney injury - This was due to volume depletion and dehydration.  Resolved with IV fluids.    Polycythemia- This appears to be chronic.  Dysphagia - Resolved, SLP input appreciated  Diabetes mellitus type 2, uncontrolled with hyperglycemia, new diagnosis - This appears to be a new diagnosis for him.  Poor outpatient glycemic control due to hyperglycemia with A1c of 12.8.  Currently on Lantus and sliding scale will go to SNF on it.      Lab Results  Component Value Date   HGBA1C 12.8 (H) 09/17/2019   CBG (last 3)  Recent Labs    10/09/19 1726 10/09/19 2206 10/10/19 0804  GLUCAP 136* 143* 107*     DVT prophylaxis: On rivaroxaban. Code Status: DO NOT INTUBATE  Family communication: HCPOA niece, updated 11/25 Disposition Plan: SNF once bed is available, social worker went to find placement, repeat COVID-19 test 11/25 remains positive Consultants: PCCM, signed off  Antimicrobials:  Anti-infectives (From admission, onward)   Start     Dose/Rate Route Frequency Ordered Stop   09/18/19 1000  remdesivir 100 mg in sodium chloride 0.9 % 250 mL IVPB     100 mg 500 mL/hr over 30 Minutes Intravenous Every 24 hours 09/17/19 1117 09/21/19 1055   09/17/19 1200  remdesivir 200 mg in sodium chloride 0.9 % 250 mL IVPB     200 mg 500 mL/hr over 30 Minutes Intravenous Once 09/17/19 1117 09/17/19 1249   09/17/19 0745  ceFEPIme (MAXIPIME) 2 g in sodium chloride  0.9 % 100 mL IVPB     2 g 200 mL/hr over 30 Minutes Intravenous  Once 09/17/19 0734 09/17/19 0837   09/17/19 0745  vancomycin (VANCOCIN) 1,750 mg in sodium chloride 0.9 % 500 mL IVPB     1,750 mg 250 mL/hr over 120 Minutes Intravenous  Once 09/17/19 0734 09/17/19 1104       Objective:  Blood pressure (!) 144/84, pulse 65, temperature 98.4 F (36.9 C), temperature source Oral, resp. rate  18, height 5\' 11"  (1.803 m), weight 98.4 kg, SpO2 100 %.  Intake/Output Summary (Last 24 hours) at 10/10/2019 0907 Last data filed at 10/10/2019 0500 Gross per 24 hour  Intake 1220 ml  Output 1400 ml  Net -180 ml   Filed Weights   09/17/19 0822 09/17/19 2200  Weight: (S) 81.6 kg 98.4 kg    Examination:  Awake, pleasantly confused, No new F.N deficits,   [place].AT,PERRAL Supple Neck,No JVD, No cervical lymphadenopathy appriciated.  Symmetrical Chest wall movement, Good air movement bilaterally, CTAB RRR,No Gallops, Rubs or new Murmurs, No Parasternal Heave +ve B.Sounds, Abd Soft, No tenderness, No organomegaly appriciated, No rebound - guarding or rigidity. No Cyanosis, Clubbing or edema, No new Rash or bruise    CBC: No results for input(s): WBC, NEUTROABS, HGB, HCT, MCV, PLT in the last 168 hours. Basic Metabolic Panel: No results for input(s): NA, K, CL, CO2, GLUCOSE, BUN, CREATININE, CALCIUM, MG, PHOS in the last 168 hours. GFR: Estimated Creatinine Clearance: 82.3 mL/min (by C-G formula based on SCr of 1.17 mg/dL).  Liver Function Tests: No results for input(s): AST, ALT, ALKPHOS, BILITOT, PROT, ALBUMIN in the last 168 hours.   HbA1C: Hgb A1c MFr Bld  Date/Time Value Ref Range Status  09/17/2019 12:42 PM 12.8 (H) 4.8 - 5.6 % Final    Comment:    (NOTE) Pre diabetes:          5.7%-6.4% Diabetes:              >6.4% Glycemic control for   <7.0% adults with diabetes     CBG: Recent Labs  Lab 10/09/19 0832 10/09/19 1307 10/09/19 1726 10/09/19 2206 10/10/19 0804  GLUCAP 116* 103* 136* 143* 107*    Recent Results (from the past 240 hour(s))  SARS CORONAVIRUS 2 (TAT 6-24 HRS) Nasopharyngeal Nasopharyngeal Swab     Status: Abnormal   Collection Time: 10/07/19  1:00 PM   Specimen: Nasopharyngeal Swab  Result Value Ref Range Status   SARS Coronavirus 2 POSITIVE (A) NEGATIVE Final    Comment: RESULT CALLED TO, READ BACK BY AND VERIFIED WITH: K KOLB,RN 1856  10/07/2019 D BRADLEY (NOTE) SARS-CoV-2 target nucleic acids are DETECTED. The SARS-CoV-2 RNA is generally detectable in upper and lower respiratory specimens during the acute phase of infection. Positive results are indicative of the presence of SARS-CoV-2 RNA. Clinical correlation with patient history and other diagnostic information is  necessary to determine patient infection status. Positive results do not rule out bacterial infection or co-infection with other viruses.  The expected result is Negative. Fact Sheet for Patients: 10/09/2019 Fact Sheet for Healthcare Providers: HairSlick.no This test is not yet approved or cleared by the [place] FDA and  has been authorized for detection and/or diagnosis of SARS-CoV-2 by FDA under an Emergency Use Authorization (EUA). This EUA will remain  in effect (meaning this test can be used) for the  duration of the COVID-19 declaration under Section 564(b)(1) of the Act, 21 U.S.C. section 360bbb-3(b)(1), unless the authorization  is terminated or revoked sooner. Performed at [facility], [place] [address] [place], [place] [address]      Scheduled Meds: . famotidine  20 mg Oral Daily  . insulin aspart  0-15 Units Subcutaneous TID WC  . insulin aspart  0-5 Units Subcutaneous QHS  . insulin glargine  10 Units Subcutaneous Daily  . mouth rinse  15 mL Mouth Rinse BID  . metoprolol tartrate  25 mg Oral BID  . multivitamin with minerals  1 tablet Oral Daily  . rivaroxaban  20 mg Oral Q supper  . thiamine  100 mg Oral Daily    LOS: 23 days   Signature  Lala Lund M.D on 10/10/2019 at 9:07 AM   -  To page go to [email or web address]

## 2019-10-11 DIAGNOSIS — U071 COVID-19: Secondary | ICD-10-CM | POA: Diagnosis not present

## 2019-10-11 DIAGNOSIS — J1289 Other viral pneumonia: Secondary | ICD-10-CM | POA: Diagnosis not present

## 2019-10-11 LAB — COMPREHENSIVE METABOLIC PANEL
ALT: 27 U/L (ref 0–44)
AST: 24 U/L (ref 15–41)
Albumin: 3.1 g/dL — ABNORMAL LOW (ref 3.5–5.0)
Alkaline Phosphatase: 62 U/L (ref 38–126)
Anion gap: 5 (ref 5–15)
BUN: 11 mg/dL (ref 6–20)
CO2: 27 mmol/L (ref 22–32)
Calcium: 8.5 mg/dL — ABNORMAL LOW (ref 8.9–10.3)
Chloride: 103 mmol/L (ref 98–111)
Creatinine, Ser: 1.19 mg/dL (ref 0.61–1.24)
GFR calc Af Amer: >60 mL/min (ref >60)
GFR calc non Af Amer: >60 mL/min (ref >60)
Glucose, Bld: 105 mg/dL — ABNORMAL HIGH (ref 70–99)
Potassium: 3.9 mmol/L (ref 3.5–5.1)
Sodium: 135 mmol/L (ref 135–145)
Total Bilirubin: 0.8 mg/dL (ref 0.3–1.2)
Total Protein: 6.7 g/dL (ref 6.5–8.1)

## 2019-10-11 LAB — CBC
HCT: 45.9 % (ref 39.0–52.0)
Hemoglobin: 15.3 g/dL (ref 13.0–17.0)
MCH: 28.9 pg (ref 26.0–34.0)
MCHC: 33.3 g/dL (ref 30.0–36.0)
MCV: 86.8 fL (ref 80.0–100.0)
Platelets: 123 10*3/uL — ABNORMAL LOW (ref 150–400)
RBC: 5.29 MIL/uL (ref 4.22–5.81)
RDW: 14.5 % (ref 11.5–15.5)
WBC: 5.4 10*3/uL (ref 4.0–10.5)
nRBC: 0 % (ref 0.0–0.2)

## 2019-10-11 LAB — MAGNESIUM
Magnesium: 1.6 mg/dL — ABNORMAL LOW (ref 1.7–2.4)
Magnesium: 2.1 mg/dL (ref 1.7–2.4)

## 2019-10-11 LAB — GLUCOSE, CAPILLARY
Glucose-Capillary: 103 mg/dL — ABNORMAL HIGH (ref 70–99)
Glucose-Capillary: 154 mg/dL — ABNORMAL HIGH (ref 70–99)
Glucose-Capillary: 98 mg/dL (ref 70–99)
Glucose-Capillary: 152 mg/dL — ABNORMAL HIGH (ref 70–99)

## 2019-10-11 MED ORDER — AMLODIPINE BESYLATE 10 MG PO TABS
10.0000 mg | ORAL_TABLET | Freq: Every day | ORAL | Status: DC
  Administered 2019-10-11 – 2019-10-18 (×8): 10 mg via ORAL
  Filled 2019-10-11 (×8): qty 1

## 2019-10-11 MED ORDER — MAGNESIUM SULFATE 2 GM/50ML IV SOLN
2.0000 g | Freq: Once | INTRAVENOUS | Status: AC
  Administered 2019-10-11: 2 g via INTRAVENOUS
  Filled 2019-10-11: qty 50

## 2019-10-11 NOTE — Discharge Instructions (Signed)
Person Under Monitoring Name: Maxwell Miller  Location: [address] [place] [id number]   Infection Prevention Recommendations for Individuals Confirmed to have, or Being Evaluated for, 2019 Novel Coronavirus (COVID-19) Infection Who Receive Care at Home  Individuals who are confirmed to have, or are being evaluated for, COVID-19 should follow the prevention steps below until a healthcare provider or local or state health department says they can return to normal activities.  Stay home except to get medical care You should restrict activities outside your home, except for getting medical care. Do not go to work, school, or public areas, and do not use public transportation or taxis.  Call ahead before visiting your doctor Before your medical appointment, call the healthcare provider and tell them that you have, or are being evaluated for, COVID-19 infection. This will help the healthcare providers office take steps to keep other people from getting infected. Ask your healthcare provider to call the local or state health department.  Monitor your symptoms Seek prompt medical attention if your illness is worsening (e.g., difficulty breathing). Before going to your medical appointment, call the healthcare provider and tell them that you have, or are being evaluated for, COVID-19 infection. Ask your healthcare provider to call the local or state health department.  Wear a facemask You should wear a facemask that covers your nose and mouth when you are in the same room with other people and when you visit a healthcare provider. People who live with or visit you should also wear a facemask while they are in the same room with you.  Separate yourself from other people in your home As much as possible, you should stay in a different room from other people in your home. Also, you should use a separate bathroom, if available.  Avoid sharing household items You should not share  dishes, drinking glasses, cups, eating utensils, towels, bedding, or other items with other people in your home. After using these items, you should wash them thoroughly with soap and water.  Cover your coughs and sneezes Cover your mouth and nose with a tissue when you cough or sneeze, or you can cough or sneeze into your sleeve. Throw used tissues in a lined trash can, and immediately wash your hands with soap and water for at least 20 seconds or use an alcohol-based hand rub.  Wash your [employer]hands Wash your hands often and thoroughly with soap and water for at least 20 seconds. You can use an alcohol-based hand sanitizer if soap and water are not available and if your hands are not visibly dirty. Avoid touching your eyes, nose, and mouth with unwashed hands.   Prevention Steps for Caregivers and Household Members of Individuals Confirmed to have, or Being Evaluated for, COVID-19 Infection Being Cared for in the Home  If you live with, or provide care at home for, a person confirmed to have, or being evaluated for, COVID-19 infection please follow these guidelines to prevent infection:  Follow healthcare providers instructions Make sure that you understand and can help the patient follow any healthcare provider instructions for all care.  Provide for the patients basic needs You should help the patient with basic needs in the home and provide support for getting groceries, prescriptions, and other personal needs.  Monitor the patients symptoms If they are getting sicker, call his or her medical provider and tell them that the patient has, or is being evaluated for, COVID-19 infection. This will help the healthcare providers office  take steps to keep other people from getting infected. Ask the healthcare provider to call the local or state health department.  Limit the number of people who have contact with the patient  If possible, have only one caregiver for the patient.  Other  household members should stay in another home or place of residence. If this is not possible, they should stay  in another room, or be separated from the patient as much as possible. Use a separate bathroom, if available.  Restrict visitors who do not have an essential need to be in the home.  Keep older adults, very young children, and other sick people away from the patient Keep older adults, very young children, and those who have compromised immune systems or chronic health conditions away from the patient. This includes people with chronic heart, lung, or kidney conditions, diabetes, and cancer.  Ensure good ventilation Make sure that shared spaces in the home have good air flow, such as from an air conditioner or an opened window, weather permitting.  Wash your hands often  Wash your hands often and thoroughly with soap and water for at least 20 seconds. You can use an alcohol based hand sanitizer if soap and water are not available and if your hands are not visibly dirty.  Avoid touching your eyes, nose, and mouth with unwashed hands.  Use disposable paper towels to dry your hands. If not available, use dedicated cloth towels and replace them when they become wet.  Wear a facemask and gloves  Wear a disposable facemask at all times in the room and gloves when you touch or have contact with the patients blood, body fluids, and/or secretions or excretions, such as sweat, saliva, sputum, nasal mucus, vomit, urine, or feces.  Ensure the mask fits over your nose and mouth tightly, and do not touch it during use.  Throw out disposable facemasks and gloves after using them. Do not reuse.  Wash your hands immediately after removing your facemask and gloves.  If your personal clothing becomes contaminated, carefully remove clothing and launder. Wash your hands after handling contaminated clothing.  Place all used disposable facemasks, gloves, and other waste in a lined container before  disposing them with other household waste.  Remove gloves and wash your hands immediately after handling these items.  Do not share dishes, glasses, or other household items with the patient  Avoid sharing household items. You should not share dishes, drinking glasses, cups, eating utensils, towels, bedding, or other items with a patient who is confirmed to have, or being evaluated for, COVID-19 infection.  After the person uses these items, you should wash them thoroughly with soap and water.  Wash laundry thoroughly  Immediately remove and wash clothes or bedding that have blood, body fluids, and/or secretions or excretions, such as sweat, saliva, sputum, nasal mucus, vomit, urine, or feces, on them.  Wear gloves when handling laundry from the patient.  Read and follow directions on labels of laundry or clothing items and detergent. In general, wash and dry with the warmest temperatures recommended on the label.  Clean all areas the individual has used often  Clean all touchable surfaces, such as counters, tabletops, doorknobs, bathroom fixtures, toilets, phones, keyboards, tablets, and bedside tables, every day. Also, clean any surfaces that may have blood, body fluids, and/or secretions or excretions on them.  Wear gloves when cleaning surfaces the patient has come in contact with.  Use a diluted bleach solution (e.g., dilute bleach with 1 part  bleach and 10 parts water) or a household disinfectant with a label that says EPA-registered for coronaviruses. To make a bleach solution at home, add 1 tablespoon of bleach to 1 quart (4 cups) of water. For a larger supply, add  cup of bleach to 1 gallon (16 cups) of water.  Read labels of cleaning products and follow recommendations provided on product labels. Labels contain instructions for safe and effective use of the cleaning product including precautions you should take when applying the product, such as wearing gloves or eye protection  and making sure you have good ventilation during use of the product.  Remove gloves and wash hands immediately after cleaning.  Monitor yourself for signs and symptoms of illness Caregivers and household members are considered close contacts, should monitor their health, and will be asked to limit movement outside of the home to the extent possible. Follow the monitoring steps for close contacts listed on the symptom monitoring form.   ? If you have additional questions, contact your local health department or call the epidemiologist on call at [phone] (available 24/7). ? This guidance is subject to change. For the most up-to-date guidance from CDC, please refer to their website: https://www.taylor.biz/ COVID-19 is a respiratory infection that is caused by a virus called severe acute respiratory syndrome coronavirus 2 (SARS-CoV-2). The disease is also known as coronavirus disease or novel coronavirus. In some people, the virus may not cause any symptoms. In others, it may cause a serious infection. The infection can get worse quickly and can lead to complications, such as:  Pneumonia, or infection of the lungs.  Acute respiratory distress syndrome or ARDS. This is fluid build-up in the lungs.  Acute respiratory failure. This is a condition in which there is not enough oxygen passing from the lungs to the body.  Sepsis or septic shock. This is a serious bodily reaction to an infection.  Blood clotting problems.  Secondary infections due to bacteria or fungus. The virus that causes COVID-19 is contagious. This means that it can spread from person to person through droplets from coughs and sneezes (respiratory secretions). What are the causes? This illness is caused by a virus. You may catch the virus by:  Breathing in droplets from an infected person's cough or sneeze.  Touching something, like a table or a doorknob, that  was exposed to the virus (contaminated) and then touching your mouth, nose, or eyes. What increases the risk? Risk for infection You are more likely to be infected with this virus if you:  Live in or travel to an area with a COVID-19 outbreak.  Come in contact with a sick person who recently traveled to an area with a COVID-19 outbreak.  Provide care for or live with a person who is infected with COVID-19. Risk for serious illness You are more likely to become seriously ill from the virus if you:  Are 58 years of age or older.  Have a long-term disease that lowers your body's ability to fight infection (immunocompromised).  Live in a nursing home or long-term care facility.  Have a long-term (chronic) disease such as: ? Chronic lung disease, including chronic obstructive pulmonary disease or asthma ? Heart disease. ? Diabetes. ? Chronic kidney disease. ? Liver disease.  Are obese. What are the signs or symptoms? Symptoms of this condition can range from mild to severe. Symptoms may appear any time from 2 to 14 days after being exposed to the virus. They include:  A fever.  A  cough.  Difficulty breathing.  Chills.  Muscle pains.  A sore throat.  Loss of taste or smell. Some people may also have stomach problems, such as nausea, vomiting, or diarrhea. Other people may not have any symptoms of COVID-19. How is this diagnosed? This condition may be diagnosed based on:  Your signs and symptoms, especially if: ? You live in an area with a COVID-19 outbreak. ? You recently traveled to or from an area where the virus is common. ? You provide care for or live with a person who was diagnosed with COVID-19.  A physical exam.  Lab tests, which may include: ? A nasal swab to take a sample of fluid from your nose. ? A throat swab to take a sample of fluid from your throat. ? A sample of mucus from your lungs (sputum). ? Blood tests.  Imaging tests, which may include,  X-rays, CT scan, or ultrasound. How is this treated? At present, there is no medicine to treat COVID-19. Medicines that treat other diseases are being used on a trial basis to see if they are effective against COVID-19. Your health care provider will talk with you about ways to treat your symptoms. For most people, the infection is mild and can be managed at home with rest, fluids, and over-the-counter medicines. Treatment for a serious infection usually takes places in a hospital intensive care unit (ICU). It may include one or more of the following treatments. These treatments are given until your symptoms improve.  Receiving fluids and medicines through an IV.  Supplemental oxygen. Extra oxygen is given through a tube in the nose, a face mask, or a hood.  Positioning you to lie on your stomach (prone position). This makes it easier for oxygen to get into the lungs.  Continuous positive airway pressure (CPAP) or bi-level positive airway pressure (BPAP) machine. This treatment uses mild air pressure to keep the airways open. A tube that is connected to a motor delivers oxygen to the body.  Ventilator. This treatment moves air into and out of the lungs by using a tube that is placed in your windpipe.  Tracheostomy. This is a procedure to create a hole in the neck so that a breathing tube can be inserted.  Extracorporeal membrane oxygenation (ECMO). This procedure gives the lungs a chance to recover by taking over the functions of the heart and lungs. It supplies oxygen to the body and removes carbon dioxide. Follow these instructions at home: Lifestyle  If you are sick, stay home except to get medical care. Your health care provider will tell you how long to stay home. Call your health care provider before you go for medical care.  Rest at home as told by your health care provider.  Do not use any products that contain nicotine or tobacco, such as cigarettes, e-cigarettes, and chewing  tobacco. If you need help quitting, ask your health care provider.  Return to your normal activities as told by your health care provider. Ask your health care provider what activities are safe for you. General instructions  Take over-the-counter and prescription medicines only as told by your health care provider.  Drink enough fluid to keep your urine pale yellow.  Keep all follow-up visits as told by your health care provider. This is important. How is this prevented?  There is no vaccine to help prevent COVID-19 infection. However, there are steps you can take to protect yourself and others from this virus. To protect yourself:   Do  not travel to areas where COVID-19 is a risk. The areas where COVID-19 is reported change often. To identify high-risk areas and travel restrictions, check the CDC travel website: StageSync.si  If you live in, or must travel to, an area where COVID-19 is a risk, take precautions to avoid infection. ? Stay away from people who are sick. ? Wash your hands often with soap and water for 20 seconds. If soap and water are not available, use an alcohol-based hand sanitizer. ? Avoid touching your mouth, face, eyes, or nose. ? Avoid going out in public, follow guidance from your state and local health authorities. ? If you must go out in public, wear a cloth face covering or face mask. ? Disinfect objects and surfaces that are frequently touched every day. This may include:  Counters and tables.  Doorknobs and light switches.  Sinks and faucets.  Electronics, such as phones, remote controls, keyboards, computers, and tablets. To protect others: If you have symptoms of COVID-19, take steps to prevent the virus from spreading to others.  If you think you have a COVID-19 infection, contact your health care provider right away. Tell your health care team that you think you may have a COVID-19 infection.  Stay home. Leave your house only to seek  medical care. Do not use public transport.  Do not travel while you are sick.  Wash your hands often with soap and water for 20 seconds. If soap and water are not available, use alcohol-based hand sanitizer.  Stay away from other members of your household. Let healthy household members care for children and pets, if possible. If you have to care for children or pets, wash your hands often and wear a mask. If possible, stay in your own room, separate from others. Use a different bathroom.  Make sure that all people in your household wash their hands well and often.  Cough or sneeze into a tissue or your sleeve or elbow. Do not cough or sneeze into your hand or into the air.  Wear a cloth face covering or face mask. Where to find more information  Centers for Disease Control and Prevention: StickerEmporium.tn  World Health Organization: https://thompson-craig.com/ Contact a health care provider if:  You live in or have traveled to an area where COVID-19 is a risk and you have symptoms of the infection.  You have had contact with someone who has COVID-19 and you have symptoms of the infection. Get help right away if:  You have trouble breathing.  You have pain or pressure in your chest.  You have confusion.  You have bluish lips and fingernails.  You have difficulty waking from sleep.  You have symptoms that get worse. These symptoms may represent a serious problem that is an emergency. Do not wait to see if the symptoms will go away. Get medical help right away. Call your local emergency services (911 in the [place] Do not drive yourself to the hospital. Let the emergency medical personnel know if you think you have COVID-19. Summary  COVID-19 is a respiratory infection that is caused by a virus. It is also known as coronavirus disease or novel coronavirus. It can cause serious infections, such as pneumonia, acute respiratory distress syndrome,  acute respiratory failure, or sepsis.  The virus that causes COVID-19 is contagious. This means that it can spread from person to person through droplets from coughs and sneezes.  You are more likely to develop a serious illness if you are 65 years of  age or older, have a weak immunity, live in a nursing home, or have chronic disease.  There is no medicine to treat COVID-19. Your health care provider will talk with you about ways to treat your symptoms.  Take steps to protect yourself and others from infection. Wash your hands often and disinfect objects and surfaces that are frequently touched every day. Stay away from people who are sick and wear a mask if you are sick. This information is not intended to replace advice given to you by your health care provider. Make sure you discuss any questions you have with your health care provider. Document Released: 12/04/2018 Document Revised: 03/26/2019 Document Reviewed: 12/04/2018 Elsevier Patient Education  2020 [employer].  Information on my medicine - XARELTO (rivaroxaban)  This medication education was reviewed with me or my healthcare representative as part of my discharge preparation.  The pharmacist that spoke with me during my hospital stay was:  Ulyses Southward, RPH-CPP  WHY WAS Carlena Hurl PRESCRIBED FOR YOU? 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  What do you need to know about Xarelto? Continue Xarelto 20 mg tablet taken ONCE A DAY with your evening meal.  DO NOT stop taking Xarelto without talking to the health care provider who prescribed the medication.  Refill your prescription for 20 mg tablets before you run out.  After discharge, you should have regular check-up appointments with your healthcare provider that is prescribing your Xarelto.  In the future your dose may  need to be changed if your kidney function changes by a significant amount.  What do you do if you miss a dose? If you are taking Xarelto TWICE DAILY and you miss a dose, take it as soon as you remember. You may take two 15 mg tablets (total 30 mg) at the same time then resume your regularly scheduled 15 mg twice daily the next day.  If you are taking Xarelto ONCE DAILY and you miss a dose, take it as soon as you remember on the same day then continue your regularly scheduled once daily regimen the next day. Do not take two doses of Xarelto at the same time.   Important Safety Information Xarelto is a blood thinner medicine that can cause bleeding. You should call your healthcare provider right away if you experience any of the following: ? Bleeding from an injury or your nose that does not stop. ? Unusual colored urine (red or dark brown) or unusual colored stools (red or black). ? Unusual bruising for unknown reasons. ? A serious fall or if you hit your head (even if there is no bleeding).  Some medicines may interact with Xarelto and might increase your risk of bleeding while on Xarelto. To help avoid this, consult your healthcare provider or pharmacist prior to using any new prescription or non-prescription medications, including herbals, vitamins, non-steroidal anti-inflammatory drugs (NSAIDs) and supplements.  This website has more information on Xarelto: VisitDestination.com.br.

## 2019-10-11 NOTE — Progress Notes (Signed)
Maxwell Miller  TDD:[id number] DOB: July 31, 1961 DOA: 09/17/2019 PCP: Ancil Boozer, PA-C    Brief Narrative:  58 year old Maxwell Miller SNF resident with a history of vascular dementia, nontraumatic intracerebral hemorrhage, portal vein thrombosis on chronic Xarelto, and a diagnosis of COVID-19 10/28 who presented to the ED with 24 hours of severely elevated blood sugars, fever to 105, and altered mental status.  The patient is reportedly conversant at baseline.  Significant Events: 11/5 admit to [facility] ICU via [place] ED 11/6 transfer to progressive care unit  COVID-19 specific Treatment: Remdesivir 11/5 > Decadron 11/5 >    Subjective:  Patient in bed, appears comfortable, denies any headache, no fever, no chest pain or pressure, no shortness of breath , no abdominal pain. No focal weakness.  Assessment & Plan:  Pneumonia due to COVID-19  Lab Results  Component Value Date   SARSCOV2NAA POSITIVE (A) 10/07/2019   SARSCOV2NAA POSITIVE (A) 09/17/2019   SARSCOV2NAA NOT DETECTED 03/30/2019   [place] NOT DETECTED 03/06/2019    Patient presented with high fever with a temperature of 105.4 F.  He was encephalopathic.  He has completed course of remdesivir.  Completed steroids as well.  This problem has resolved clinically and he is symptom-free from the standpoint.   SIRS/lactic acidosis Due to the above.  Now resolved.  Acute metabolic encephalopathy -Patient with underlying baseline vascular dementia at baseline. No acute findings on CT head.  This was thought to be mainly in the setting of acute COVID-19 infection, electrolyte derangement and hypernatremia.  Patient also was dehydrated and so he was given IV fluids.  He did have elevated ammonia level initially but this has normalized without intervention.  B12 and folate levels were normal.  He is back to his baseline with baseline dementia and pleasant confusion.  Hypomagnesemia -  Replaced will recheck.      HTN - on Lopressor, added Norvasc for better control.    Hyperammonemia - Normalized without intervention.  History of portal vein thrombosis - Chronically on rivaroxaban.  Was on Lovenox here due to poor oral intake.  Now back on rivaroxaban.  Acute kidney injury - This was due to volume depletion and dehydration.  Resolved with IV fluids.    Polycythemia- This appears to be chronic.  Dysphagia - Resolved, SLP input appreciated  Diabetes mellitus type 2, uncontrolled with hyperglycemia, new diagnosis - This appears to be a new diagnosis for him.  Poor outpatient glycemic control due to hyperglycemia with A1c of 12.8.  Currently on Lantus and sliding scale will go to SNF on it.      Lab Results  Component Value Date   HGBA1C 12.8 (H) 09/17/2019   CBG (last 3)  Recent Labs    10/10/19 1625 10/10/19 2033 10/11/19 0804  GLUCAP 130* 169* 103*     DVT prophylaxis: On rivaroxaban. Code Status: DO NOT INTUBATE  Family communication: HCPOA niece, updated 11/25 Disposition Plan: SNF once bed is available, social worker went to find placement, repeat COVID-19 test 11/25 remains positive Consultants: PCCM, signed off  Antimicrobials:  Anti-infectives (From admission, onward)   Start     Dose/Rate Route Frequency Ordered Stop   09/18/19 1000  remdesivir 100 mg in sodium chloride 0.9 % 250 mL IVPB     100 mg 500 mL/hr over 30 Minutes Intravenous Every 24 hours 09/17/19 1117 09/21/19 1055   09/17/19 1200  remdesivir 200 mg in sodium chloride 0.9 % 250 mL IVPB     200 mg  500 mL/hr over 30 Minutes Intravenous Once 09/17/19 1117 09/17/19 1249   09/17/19 0745  ceFEPIme (MAXIPIME) 2 g in sodium chloride 0.9 % 100 mL IVPB     2 g 200 mL/hr over 30 Minutes Intravenous  Once 09/17/19 0734 09/17/19 0837   09/17/19 0745  vancomycin (VANCOCIN) 1,750 mg in sodium chloride 0.9 % 500 mL IVPB     1,750 mg 250 mL/hr over 120 Minutes Intravenous  Once 09/17/19 0734 09/17/19 1104        Objective:  Blood pressure (!) 148/89, pulse 66, temperature 98.5 F (36.9 C), temperature source Oral, resp. rate 19, height 5\' 11"  (1.803 m), weight 98.4 kg, SpO2 100 %.  Intake/Output Summary (Last 24 hours) at 10/11/2019 0904 Last data filed at 10/11/2019 0630 Gross per 24 hour  Intake 120 ml  Output 1000 ml  Net -880 ml   Filed Weights   09/17/19 0822 09/17/19 2200  Weight: (S) 81.6 kg 98.4 kg    Examination:  Awake, remains pleasantly confused, no new F.N deficits,   [place].AT,PERRAL Supple Neck,No JVD, No cervical lymphadenopathy appriciated.  Symmetrical Chest wall movement, Good air movement bilaterally, CTAB RRR,No Gallops, Rubs or new Murmurs, No Parasternal Heave +ve B.Sounds, Abd Soft, No tenderness, No organomegaly appriciated, No rebound - guarding or rigidity. No Cyanosis, Clubbing or edema, No new Rash or bruise   CBC: Recent Labs  Lab 10/11/19 0637  WBC 5.4  HGB 15.3  HCT 45.9  MCV 86.8  PLT 123*   Basic Metabolic Panel: Recent Labs  Lab 10/11/19 0637  NA 135  K 3.9  CL 103  CO2 27  GLUCOSE 105*  BUN 11  CREATININE 1.19  CALCIUM 8.5*  MG 1.6*   GFR: Estimated Creatinine Clearance: 80.9 mL/min (by C-G formula based on SCr of 1.19 mg/dL).  Liver Function Tests: Recent Labs  Lab 10/11/19 0637  AST 24  ALT 27  ALKPHOS 62  BILITOT 0.8  PROT 6.7  ALBUMIN 3.1*     HbA1C: Hgb A1c MFr Bld  Date/Time Value Ref Range Status  09/17/2019 12:42 PM 12.8 (H) 4.8 - 5.6 % Final    Comment:    (NOTE) Pre diabetes:          5.7%-6.4% Diabetes:              >6.4% Glycemic control for   <7.0% adults with diabetes     CBG: Recent Labs  Lab 10/10/19 0804 10/10/19 1210 10/10/19 1625 10/10/19 2033 10/11/19 0804  GLUCAP 107* 118* 130* 169* 103*    Recent Results (from the past 240 hour(s))  SARS CORONAVIRUS 2 (TAT 6-24 HRS) Nasopharyngeal Nasopharyngeal Swab     Status: Abnormal   Collection Time: 10/07/19  1:00 PM   Specimen:  Nasopharyngeal Swab  Result Value Ref Range Status   SARS Coronavirus 2 POSITIVE (A) NEGATIVE Final    Comment: RESULT CALLED TO, READ BACK BY AND VERIFIED WITH: K KOLB,RN 1856 10/07/2019 D BRADLEY (NOTE) SARS-CoV-2 target nucleic acids are DETECTED. The SARS-CoV-2 RNA is generally detectable in upper and lower respiratory specimens during the acute phase of infection. Positive results are indicative of the presence of SARS-CoV-2 RNA. Clinical correlation with patient history and other diagnostic information is  necessary to determine patient infection status. Positive results do not rule out bacterial infection or co-infection with other viruses.  The expected result is Negative. Fact Sheet for Patients: 10/09/2019 Fact Sheet for Healthcare Providers: HairSlick.no This test is not yet approved or cleared  by the [place] FDA and  has been authorized for detection and/or diagnosis of SARS-CoV-2 by FDA under an Emergency Use Authorization (EUA). This EUA will remain  in effect (meaning this test can be used) for the  duration of the COVID-19 declaration under Section 564(b)(1) of the Act, 21 U.S.C. section 360bbb-3(b)(1), unless the authorization is terminated or revoked sooner. Performed at [facility], [address] [place], [place] [address]      Scheduled Meds:  amLODipine  10 mg Oral Daily   famotidine  20 mg Oral Daily   insulin aspart  0-15 Units Subcutaneous TID WC   insulin aspart  0-5 Units Subcutaneous QHS   insulin glargine  10 Units Subcutaneous Daily   mouth rinse  15 mL Mouth Rinse BID   metoprolol tartrate  25 mg Oral BID   multivitamin with minerals  1 tablet Oral Daily   rivaroxaban  20 mg Oral Q supper   thiamine  100 mg Oral Daily    LOS: 24 days   Signature  Susa RaringPrashant Rasaan Brotherton M.D on 10/11/2019 at 9:04 AM   -  To page go to [email or web address]

## 2019-10-12 DIAGNOSIS — J1289 Other viral pneumonia: Secondary | ICD-10-CM | POA: Diagnosis not present

## 2019-10-12 DIAGNOSIS — U071 COVID-19: Secondary | ICD-10-CM | POA: Diagnosis not present

## 2019-10-12 LAB — GLUCOSE, CAPILLARY
Glucose-Capillary: 100 mg/dL — ABNORMAL HIGH (ref 70–99)
Glucose-Capillary: 138 mg/dL — ABNORMAL HIGH (ref 70–99)
Glucose-Capillary: 133 mg/dL — ABNORMAL HIGH (ref 70–99)
Glucose-Capillary: 151 mg/dL — ABNORMAL HIGH (ref 70–99)

## 2019-10-12 MED ORDER — CARVEDILOL 3.125 MG PO TABS
3.1250 mg | ORAL_TABLET | Freq: Once | ORAL | Status: DC
  Filled 2019-10-12: qty 1

## 2019-10-12 MED ORDER — CARVEDILOL 3.125 MG PO TABS
6.2500 mg | ORAL_TABLET | Freq: Two times a day (BID) | ORAL | Status: DC
  Administered 2019-10-12 – 2019-10-18 (×13): 6.25 mg via ORAL
  Filled 2019-10-12 (×13): qty 2

## 2019-10-12 NOTE — Progress Notes (Signed)
Occupational Therapy Treatment Patient Details Name: Maxwell Miller MRN: [id number] DOB: 1961/02/18 Today's Date: 10/12/2019    History of present illness 58 y/o w/ hx of vascular dementia, CVA, portal vein thrombosis, non traumatic intracerebral hemorrhage, HLD, HTN, COVID 19   OT comments  Pt making progress in therapy. Pt easily frustrated if instructions are not phrased how he wants them. Pt tolerated sitting EOB 15+ min while engaging in self-care tasks. No reports of SOB throughout. 0/4 DOE. Pt transferred to bedside chair with supervision and without use of AD. Noted 0 instances of LOB, however pt unsteady on feet.    Follow Up Recommendations  SNF    Equipment Recommendations       Recommendations for Other Services      Precautions / Restrictions Precautions Precautions: Fall Restrictions Weight Bearing Restrictions: No       Mobility Bed Mobility Overal bed mobility: Needs Assistance Bed Mobility: Supine to Sit     Supine to sit: Supervision;HOB elevated        Transfers Overall transfer level: Needs assistance Equipment used: None Transfers: Sit to/from Stand Sit to Stand: Supervision Stand pivot transfers: Supervision            Balance Overall balance assessment: Needs assistance   Sitting balance-Leahy Scale: Good       Standing balance-Leahy Scale: Fair                             ADL either performed or assessed with clinical judgement   ADL Overall ADL's : Needs assistance/impaired     Grooming: Wash/dry face;Wash/dry hands;Oral care;Set up;Sitting               Lower Body Dressing: Supervision/safety;Sitting/lateral leans               Functional mobility during ADLs: Supervision/safety       Vision       Perception     Praxis      Cognition Arousal/Alertness: Awake/alert Behavior During Therapy: (Questioning. Easily frustrated.) Overall Cognitive Status: No family/caregiver present to  determine baseline cognitive functioning                                          Exercises Exercises: Other exercises Other Exercises Other Exercises: Pursed lip breathing x 10 with min cues on technique.   Shoulder Instructions       General Comments Educated pt on safety strategies, activity modifications, and energy conservation techniques with fair understanding. Educated pt on DME options for shower with fair understanding.     Pertinent Vitals/ Pain       Pain Assessment: No/denies pain  Home Living                                          Prior Functioning/Environment              Frequency           Progress Toward Goals  OT Goals(current goals can now be found in the care plan section)  Progress towards OT goals: Progressing toward goals  Acute Rehab OT Goals Patient Stated Goal: Does not state any goals ADL Goals Pt Will Perform Eating: with supervision;with set-up;sitting Pt Will Perform Grooming:  with supervision;with set-up;sitting Pt Will Transfer to Toilet: with min assist;bedside commode Additional ADL Goal #1: Pt will maintain SpO2 above 90 during ADL adn mobility  Plan Discharge plan remains appropriate    Co-evaluation                 AM-PAC OT "6 Clicks" Daily Activity     Outcome Measure   Help from another person eating meals?: A Little Help from another person taking care of personal grooming?: A Little Help from another person toileting, which includes using toliet, bedpan, or urinal?: A Lot Help from another person bathing (including washing, rinsing, drying)?: A Lot Help from another person to put on and taking off regular upper body clothing?: A Little Help from another person to put on and taking off regular lower body clothing?: A Little 6 Click Score: 16    End of Session    OT Visit Diagnosis: Unsteadiness on feet (R26.81);Muscle weakness (generalized) (M62.81)   Activity  Tolerance Patient tolerated treatment well   Patient Left in chair;with call bell/phone within reach   Nurse Communication Mobility status        Time: 6789-3810 OT Time Calculation (min): 29 min  Charges: OT General Charges $OT Visit: 1 Visit OT Treatments $Self Care/Home Management : 8-22 mins $Therapeutic Activity: 8-22 mins  Mauri Brooklyn OTR/L [phone]    Mauri Brooklyn 10/12/2019, 2:40 PM

## 2019-10-12 NOTE — TOC Progression Note (Signed)
Transition of Care [facility]) - Progression Note    Patient Details  Name: Maxwell Miller MRN: [id number] Date of Birth: 19-Sep-1961  [facility] CM/SW Contact  Joaquin Courts, RN Phone Number: 10/12/2019, 1:24 PM  Clinical Narrative:    CM spoke with [facility] rep who reports that the patient's insurance Josem Kaufmann has been denied and peer to peer review was denied as well. Facility is in the process of looking into patient's medicaid benefits to see if medicaid will cover a SNF stay. Facility rep to update CM once information is obtained.    Expected Discharge Plan: Skilled Nursing Facility Barriers to Discharge: Insurance Authorization  Expected Discharge Plan and Services Expected Discharge Plan: [place]         Expected Discharge Date: 09/28/19                                     Social Determinants of Health (SDOH) Interventions    Readmission Risk Interventions No flowsheet data found.

## 2019-10-12 NOTE — Progress Notes (Signed)
Maxwell Miller  PYK:[id number] DOB: 01-Jun-1961 DOA: 09/17/2019 PCP: Mable Paris, PA-C    Brief Narrative:  58 year old [place] SNF resident with a history of vascular dementia, nontraumatic intracerebral hemorrhage, portal vein thrombosis on chronic Xarelto, and a diagnosis of COVID-19 10/28 who presented to the ED with 24 hours of severely elevated blood sugars, fever to 105, and altered mental status.  The patient is reportedly conversant at baseline.  Significant Events: 11/5 admit to [facility] ICU via [name] long ED 11/6 transfer to progressive care unit  COVID-19 specific Treatment: Remdesivir 11/5 > Decadron 11/5 >    Subjective:  Patient in bed, appears comfortable, denies any headache, no fever, no chest pain or pressure, no shortness of breath , no abdominal pain. No focal weakness.  Assessment & Plan:  Pneumonia due to COVID-19  Lab Results  Component Value Date   SARSCOV2NAA POSITIVE (A) 10/07/2019   SARSCOV2NAA POSITIVE (A) 09/17/2019   SARSCOV2NAA NOT DETECTED 03/30/2019   SARSCOV2NAA NOT DETECTED 03/06/2019    Patient presented with high fever with a temperature of 105.4 F.  He was encephalopathic.  He has completed course of remdesivir.  Completed steroids as well.  This problem has resolved clinically and he is symptom-free from the standpoint.   SIRS/lactic acidosis Due to the above.  Now resolved.  Acute metabolic encephalopathy -Patient with underlying baseline vascular dementia at baseline. No acute findings on CT head.  This was thought to be mainly in the setting of acute COVID-19 infection, electrolyte derangement and hypernatremia.  Patient also was dehydrated and so he was given IV fluids.  He did have elevated ammonia level initially but this has normalized without intervention.  B12 and folate levels were normal.  He is back to his baseline with baseline dementia and pleasant confusion.  Hypomagnesemia - was replaced and now  stable.  HTN - BP running high, have placed on moderate dose Coreg and full dose Norvasc.  Monitor and adjust.  As needed IV labetalol.  Hyperammonemia - Normalized without intervention.  History of portal vein thrombosis - Chronically on rivaroxaban.  Was on Lovenox here due to poor oral intake.  Now back on rivaroxaban.  Acute kidney injury - This was due to volume depletion and dehydration.  Resolved with IV fluids.    Polycythemia- This appears to be chronic.  Dysphagia - Resolved, SLP input appreciated  Diabetes mellitus type 2, uncontrolled with hyperglycemia, new diagnosis - This appears to be a new diagnosis for him.  Poor outpatient glycemic control due to hyperglycemia with A1c of 12.8.  Currently on Lantus and sliding scale will go to SNF on it.      Lab Results  Component Value Date   HGBA1C 12.8 (H) 09/17/2019   CBG (last 3)  Recent Labs    10/11/19 1654 10/11/19 2152 10/12/19 0805  GLUCAP 98 152* 100*     DVT prophylaxis: On rivaroxaban. Code Status: DO NOT INTUBATE  Family communication: HCPOA niece, updated 11/25 Disposition Plan: SNF once bed is available, social worker went to find placement, repeat COVID-19 test 11/25 remains positive Consultants: PCCM, signed off  Antimicrobials:  Anti-infectives (From admission, onward)   Start     Dose/Rate Route Frequency Ordered Stop   09/18/19 1000  remdesivir 100 mg in sodium chloride 0.9 % 250 mL IVPB     100 mg 500 mL/hr over 30 Minutes Intravenous Every 24 hours 09/17/19 1117 09/21/19 1055   09/17/19 1200  remdesivir 200 mg in sodium chloride  0.9 % 250 mL IVPB     200 mg 500 mL/hr over 30 Minutes Intravenous Once 09/17/19 1117 09/17/19 1249   09/17/19 0745  ceFEPIme (MAXIPIME) 2 g in sodium chloride 0.9 % 100 mL IVPB     2 g 200 mL/hr over 30 Minutes Intravenous  Once 09/17/19 0734 09/17/19 0837   09/17/19 0745  vancomycin (VANCOCIN) 1,750 mg in sodium chloride 0.9 % 500 mL IVPB     1,750 mg 250 mL/hr  over 120 Minutes Intravenous  Once 09/17/19 0734 09/17/19 1104       Objective:  Blood pressure (!) 146/99, pulse 74, temperature 98.7 F (37.1 C), temperature source Oral, resp. rate 18, height 5\' 11"  (1.803 m), weight 98.4 kg, SpO2 99 %.  Intake/Output Summary (Last 24 hours) at 10/12/2019 0946 Last data filed at 10/12/2019 0802 Gross per 24 hour  Intake 720 ml  Output 2225 ml  Net -1505 ml   Filed Weights   09/17/19 0822 09/17/19 2200  Weight: (S) 81.6 kg 98.4 kg    Examination:  Awake but remains pleasantly confused, No new F.N deficits,   [place].AT,PERRAL Supple Neck,No JVD, No cervical lymphadenopathy appriciated.  Symmetrical Chest wall movement, Good air movement bilaterally, CTAB RRR,No Gallops, Rubs or new Murmurs, No Parasternal Heave +ve B.Sounds, Abd Soft, No tenderness, No organomegaly appriciated, No rebound - guarding or rigidity. No Cyanosis, Clubbing or edema, No new Rash or bruise    CBC: Recent Labs  Lab 10/11/19 0637  WBC 5.4  HGB 15.3  HCT 45.9  MCV 86.8  PLT 123*   Basic Metabolic Panel: Recent Labs  Lab 10/11/19 0637 10/11/19 1650  NA 135  --   K 3.9  --   CL 103  --   CO2 27  --   GLUCOSE 105*  --   BUN 11  --   CREATININE 1.19  --   CALCIUM 8.5*  --   MG 1.6* 2.1   GFR: Estimated Creatinine Clearance: 80.9 mL/min (by C-G formula based on SCr of 1.19 mg/dL).  Liver Function Tests: Recent Labs  Lab 10/11/19 0637  AST 24  ALT 27  ALKPHOS 62  BILITOT 0.8  PROT 6.7  ALBUMIN 3.1*     HbA1C: Hgb A1c MFr Bld  Date/Time Value Ref Range Status  09/17/2019 12:42 PM 12.8 (H) 4.8 - 5.6 % Final    Comment:    (NOTE) Pre diabetes:          5.7%-6.4% Diabetes:              >6.4% Glycemic control for   <7.0% adults with diabetes     CBG: Recent Labs  Lab 10/11/19 0804 10/11/19 1136 10/11/19 1654 10/11/19 2152 10/12/19 0805  GLUCAP 103* 154* 98 152* 100*    Recent Results (from the past 240 hour(s))  SARS  CORONAVIRUS 2 (TAT 6-24 HRS) Nasopharyngeal Nasopharyngeal Swab     Status: Abnormal   Collection Time: 10/07/19  1:00 PM   Specimen: Nasopharyngeal Swab  Result Value Ref Range Status   SARS Coronavirus 2 POSITIVE (A) NEGATIVE Final    Comment: RESULT CALLED TO, READ BACK BY AND VERIFIED WITH: K KOLB,RN 1856 10/07/2019 D BRADLEY (NOTE) SARS-CoV-2 target nucleic acids are DETECTED. The SARS-CoV-2 RNA is generally detectable in upper and lower respiratory specimens during the acute phase of infection. Positive results are indicative of the presence of SARS-CoV-2 RNA. Clinical correlation with patient history and other diagnostic information is  necessary to determine patient  infection status. Positive results do not rule out bacterial infection or co-infection with other viruses.  The expected result is Negative. Fact Sheet for Patients: SugarRoll.be Fact Sheet for Healthcare Providers: https://www.woods-mathews.com/ This test is not yet approved or cleared by the [place] FDA and  has been authorized for detection and/or diagnosis of SARS-CoV-2 by FDA under an Emergency Use Authorization (EUA). This EUA will remain  in effect (meaning this test can be used) for the  duration of the COVID-19 declaration under Section 564(b)(1) of the Act, 21 U.S.C. section 360bbb-3(b)(1), unless the authorization is terminated or revoked sooner. Performed at [facility], [place] [address], [place], [place] [address]      Scheduled Meds: . amLODipine  10 mg Oral Daily  . famotidine  20 mg Oral Daily  . insulin aspart  0-15 Units Subcutaneous TID WC  . insulin aspart  0-5 Units Subcutaneous QHS  . insulin glargine  10 Units Subcutaneous Daily  . mouth rinse  15 mL Mouth Rinse BID  . metoprolol tartrate  25 mg Oral BID  . multivitamin with minerals  1 tablet Oral Daily  . rivaroxaban  20 mg Oral Q supper  . thiamine  100 mg Oral Daily    LOS:  25 days   Signature  Lala Lund M.D on 10/12/2019 at 9:46 AM   -  To page go to [email or web address]

## 2019-10-13 DIAGNOSIS — U071 COVID-19: Secondary | ICD-10-CM | POA: Diagnosis not present

## 2019-10-13 DIAGNOSIS — J1289 Other viral pneumonia: Secondary | ICD-10-CM | POA: Diagnosis not present

## 2019-10-13 LAB — GLUCOSE, CAPILLARY
Glucose-Capillary: 96 mg/dL (ref 70–99)
Glucose-Capillary: 132 mg/dL — ABNORMAL HIGH (ref 70–99)
Glucose-Capillary: 148 mg/dL — ABNORMAL HIGH (ref 70–99)
Glucose-Capillary: 157 mg/dL — ABNORMAL HIGH (ref 70–99)

## 2019-10-13 LAB — SARS CORONAVIRUS 2 (TAT 6-24 HRS): SARS Coronavirus 2: NEGATIVE

## 2019-10-13 NOTE — Progress Notes (Signed)
Referral received for new diagnosis of DM.  A1C=12.8%.  Communicated with RN on 10/12/19 and she states that patient would not be appropriate for education.  Note plans for d/c back to SNF.   Thanks,  Adah Perl, RN, BC-ADM Inpatient Diabetes Coordinator Pager [phone]

## 2019-10-13 NOTE — TOC Progression Note (Signed)
Transition of Care [facility]) - Progression Note    Patient Details  Name: Maxwell Miller MRN: [id number] Date of Birth: 1961/05/28  [facility]) CM/SW Contact  Joaquin Courts, RN Phone Number: 10/13/2019, 3:06 PM  Clinical Narrative:    CM sent updated therapy notes to [facility] for auth process. At this time patient remains without insurance auth. [facility] continues to work with insurance to authorize stay.    Expected Discharge Plan: Skilled Nursing Facility Barriers to Discharge: Insurance Authorization  Expected Discharge Plan and Services Expected Discharge Plan: [place]         Expected Discharge Date: 09/28/19                                     Social Determinants of Health (SDOH) Interventions    Readmission Risk Interventions No flowsheet data found.

## 2019-10-13 NOTE — Progress Notes (Signed)
Anola GurneyHansy Nourse  ZOX:[id number] DOB: Feb 08, 1961 DOA: 09/17/2019 PCP: Mable ParisWilliams, Katrina, PA-C    Brief Narrative:  58 year old [facility] resident with a history of vascular dementia, nontraumatic intracerebral hemorrhage, portal vein thrombosis on chronic Xarelto, and a diagnosis of COVID-19 10/28 who presented to the ED with 24 hours of severely elevated blood sugars, fever to 105, and altered mental status.  He has been successfully treated for his COVID-19 infection, he is at his baseline now which is pleasantly confused.  He awaits an negative Covid test for placement purposes.  Repeat test ordered on 10/13/2019.  This will be his third test.    Significant Events: 11/5 [facility] 11/6 transfer to progressive care unit  COVID-19 specific Treatment: Remdesivir 11/5 > Decadron 11/5 >    Subjective:  Patient in bed, appears comfortable having breakfast, denies any chest or abdominal pain.  No shortness of breath.  Assessment & Plan:  Pneumonia due to COVID-19  Lab Results  Component Value Date   SARSCOV2NAA POSITIVE (A) 10/07/2019   SARSCOV2NAA POSITIVE (A) 09/17/2019   SARSCOV2NAA NOT DETECTED 03/30/2019   SARSCOV2NAA NOT DETECTED 03/06/2019    Patient presented with high fever with a temperature of 105.4 F.  He was encephalopathic.  He has completed course of remdesivir.  Completed steroids as well.  This problem has resolved clinically and he is symptom-free from the standpoint.  Repeat Covid test for placement purposes ordered again on 10/13/2019.   SIRS/lactic acidosis Due to the above.  Now resolved.  Acute metabolic encephalopathy -Patient with underlying baseline vascular dementia at baseline. No acute findings on CT head.  This was thought to be mainly in the setting of acute COVID-19 infection, electrolyte derangement and hypernatremia.  Patient also was dehydrated and so he was given IV fluids.  He did have elevated  ammonia level initially but this has normalized without intervention.  B12 and folate levels were normal.  He is back to his baseline with baseline dementia and pleasant confusion.  Hypomagnesemia - was replaced and now stable.  HTN - BP running high, have placed on moderate dose Coreg and full dose Norvasc.  Monitor and adjust.  As needed IV labetalol.  Hyperammonemia - Normalized without intervention.  History of portal vein thrombosis - Chronically on rivaroxaban.  Was on Lovenox here due to poor oral intake.  Now back on rivaroxaban.  Acute kidney injury - This was due to volume depletion and dehydration.  Resolved with IV fluids.    Polycythemia- This appears to be chronic.  Dysphagia - Resolved, SLP input appreciated  Diabetes mellitus type 2, uncontrolled with hyperglycemia, new diagnosis - This appears to be a new diagnosis for him.  Poor outpatient glycemic control due to hyperglycemia with A1c of 12.8.  Currently on Lantus and sliding scale will go to SNF on it.      Lab Results  Component Value Date   HGBA1C 12.8 (H) 09/17/2019   CBG (last 3)  Recent Labs    10/12/19 1622 10/12/19 2117 10/13/19 0823  GLUCAP 133* 151* 96     DVT prophylaxis: On rivaroxaban. Code Status: DO NOT INTUBATE  Family communication: HCPOA niece, updated 11/25 Disposition Plan: SNF once bed is available, social worker went to find placement, repeat COVID-19 test 11/25 remains positive, repeat Covid test ordered on 10/13/2019 morning. Consultants: PCCM, signed off  Antimicrobials:  Anti-infectives (From admission, onward)   Start     Dose/Rate Route Frequency Ordered  Stop   09/18/19 1000  remdesivir 100 mg in sodium chloride 0.9 % 250 mL IVPB     100 mg 500 mL/hr over 30 Minutes Intravenous Every 24 hours 09/17/19 1117 09/21/19 1055   09/17/19 1200  remdesivir 200 mg in sodium chloride 0.9 % 250 mL IVPB     200 mg 500 mL/hr over 30 Minutes Intravenous Once 09/17/19 1117 09/17/19 1249    09/17/19 0745  ceFEPIme (MAXIPIME) 2 g in sodium chloride 0.9 % 100 mL IVPB     2 g 200 mL/hr over 30 Minutes Intravenous  Once 09/17/19 0734 09/17/19 0837   09/17/19 0745  vancomycin (VANCOCIN) 1,750 mg in sodium chloride 0.9 % 500 mL IVPB     1,750 mg 250 mL/hr over 120 Minutes Intravenous  Once 09/17/19 0734 09/17/19 1104       Objective:  Blood pressure (!) 139/96, pulse 81, temperature 98.4 F (36.9 C), temperature source Oral, resp. rate 18, height 5\' 11"  (1.803 m), weight 98.4 kg, SpO2 100 %. No intake or output data in the 24 hours ending 10/13/19 0927 Filed Weights   09/17/19 0822 09/17/19 2200  Weight: (S) 81.6 kg 98.4 kg    Examination:  Awake but remains pleasantly confused, No new F.N deficits,   [place].AT,PERRAL Supple Neck,No JVD, No cervical lymphadenopathy appriciated.  Symmetrical Chest wall movement, Good air movement bilaterally, CTAB RRR,No Gallops, Rubs or new Murmurs, No Parasternal Heave +ve B.Sounds, Abd Soft, No tenderness, No organomegaly appriciated, No rebound - guarding or rigidity. No Cyanosis, Clubbing or edema, No new Rash or bruise   CBC: Recent Labs  Lab 10/11/19 0637  WBC 5.4  HGB 15.3  HCT 45.9  MCV 86.8  PLT 123*   Basic Metabolic Panel: Recent Labs  Lab 10/11/19 0637 10/11/19 1650  NA 135  --   K 3.9  --   CL 103  --   CO2 27  --   GLUCOSE 105*  --   BUN 11  --   CREATININE 1.19  --   CALCIUM 8.5*  --   MG 1.6* 2.1   GFR: Estimated Creatinine Clearance: 80.9 mL/min (by C-G formula based on SCr of 1.19 mg/dL).  Liver Function Tests: Recent Labs  Lab 10/11/19 0637  AST 24  ALT 27  ALKPHOS 62  BILITOT 0.8  PROT 6.7  ALBUMIN 3.1*     HbA1C: Hgb A1c MFr Bld  Date/Time Value Ref Range Status  09/17/2019 12:42 PM 12.8 (H) 4.8 - 5.6 % Final    Comment:    (NOTE) Pre diabetes:          5.7%-6.4% Diabetes:              >6.4% Glycemic control for   <7.0% adults with diabetes     CBG: Recent Labs  Lab  10/12/19 0805 10/12/19 1218 10/12/19 1622 10/12/19 2117 10/13/19 0823  GLUCAP 100* 138* 133* 151* 96    Recent Results (from the past 240 hour(s))  SARS CORONAVIRUS 2 (TAT 6-24 HRS) Nasopharyngeal Nasopharyngeal Swab     Status: Abnormal   Collection Time: 10/07/19  1:00 PM   Specimen: Nasopharyngeal Swab  Result Value Ref Range Status   SARS Coronavirus 2 POSITIVE (A) NEGATIVE Final    Comment: RESULT CALLED TO, READ BACK BY AND VERIFIED WITH: K KOLB,RN 1856 10/07/2019 D BRADLEY (NOTE) SARS-CoV-2 target nucleic acids are DETECTED. The SARS-CoV-2 RNA is generally detectable in upper and lower respiratory specimens during the acute phase of infection.  Positive results are indicative of the presence of SARS-CoV-2 RNA. Clinical correlation with patient history and other diagnostic information is  necessary to determine patient infection status. Positive results do not rule out bacterial infection or co-infection with other viruses.  The expected result is Negative. Fact Sheet for Patients: SugarRoll.be Fact Sheet for Healthcare Providers: https://www.woods-mathews.com/ This test is not yet approved or cleared by the [place] FDA and  has been authorized for detection and/or diagnosis of SARS-CoV-2 by FDA under an Emergency Use Authorization (EUA). This EUA will remain  in effect (meaning this test can be used) for the  duration of the COVID-19 declaration under Section 564(b)(1) of the Act, 21 U.S.C. section 360bbb-3(b)(1), unless the authorization is terminated or revoked sooner. Performed at [facility], [place] [address], [place], [place] [address]      Scheduled Meds: . amLODipine  10 mg Oral Daily  . carvedilol  3.125 mg Oral Once  . carvedilol  6.25 mg Oral BID WC  . famotidine  20 mg Oral Daily  . insulin aspart  0-15 Units Subcutaneous TID WC  . insulin aspart  0-5 Units Subcutaneous QHS  . insulin glargine  10  Units Subcutaneous Daily  . mouth rinse  15 mL Mouth Rinse BID  . multivitamin with minerals  1 tablet Oral Daily  . rivaroxaban  20 mg Oral Q supper  . thiamine  100 mg Oral Daily    LOS: 26 days   Signature  Lala Lund M.D on 10/13/2019 at 9:27 AM   -  To page go to [email or web address]

## 2019-10-13 NOTE — Progress Notes (Signed)
Physical Therapy Treatment Patient Details Name: Maxwell Miller MRN: [id number] DOB: 1961-03-12 Today's Date: 10/13/2019    History of Present Illness 58 y.o. male admitted on 09/17/19 for elevated blood sugars, fever (105), and AMS.  Pt was previously dx with COVID 19 on 09/09/19.  Pt dx this admission with acute metabolic encephalopathy in the setting of underlying vascular dementia. Pt with other significant PMH of non-traumatic ICH, HTN.      PT Comments    Pt is progressing well with gait and mobility.  He remains confused, tangential with poor safety awareness, and insight into his deficits and health.  He was able to finally be convinced to get up and walk and needed close supervision for safety due to balance deficits during dynamic gait.  He could only tolerate one loop around the unit as he reported his legs were fatiguing at the end of our walk.  I could not get him to initiate standing LE exercises once back into room as his cognition makes it hard to get him to initiate a task and to re-direct him to the task (not just talking about and analyzing the task).  Pt remains appropriate for SNF level rehab at discharge and should likely progress to a memory care ALF after rehab.  PT will continue to follow acutely for safe mobility progression.  Follow Up Recommendations  SNF     Equipment Recommendations  None recommended by PT    Recommendations for Other Services   NA     Precautions / Restrictions Precautions Precautions: Fall Precaution Comments: mildly unsteady    Mobility  Bed Mobility Overal bed mobility: Needs Assistance Bed Mobility: Supine to Sit     Supine to sit: Supervision     General bed mobility comments: supervision for safety.  Pt did not want me to help him up.   Transfers Overall transfer level: Needs assistance Equipment used: None Transfers: Sit to/from Stand Sit to Stand: Supervision         General transfer comment: supervision for  safety  Ambulation/Gait Ambulation/Gait assistance: Supervision Gait Distance (Feet): 200 Feet Assistive device: None Gait Pattern/deviations: Step-through pattern;Staggering right;Staggering left Gait velocity: decreased Gait velocity interpretation: <1.8 ft/sec, indicate of risk for recurrent falls General Gait Details: Pt with mildly staggering gait pattern, close supervision for safety, only able to walk one lap around the unit due to bil LE fatigue/shaking by the end of gait.  Pt difficult to initiate task and then difficult to re-direct to task.            Balance Overall balance assessment: Needs assistance Sitting-balance support: Feet supported;No upper extremity supported Sitting balance-Leahy Scale: Good     Standing balance support: No upper extremity supported Standing balance-Leahy Scale: Fair Standing balance comment: close supervision in standing.                             Cognition Arousal/Alertness: Awake/alert Behavior During Therapy: WFL for tasks assessed/performed Overall Cognitive Status: History of cognitive impairments - at baseline                                 General Comments: Pt with h/o vascualr dementia, poor insight into deficits, reasoning, and safety are both impaired.       Exercises Other Exercises Other Exercises: Attempted to get pt to do standing exercises, but had difficulty getting him to  initiate the task without starting in with his circular reasoning, will re-attempt next session.         Pertinent Vitals/Pain Pain Assessment: No/denies pain Pain Score: 0-No pain           PT Goals (current goals can now be found in the care plan section) Acute Rehab PT Goals Patient Stated Goal: Pt is tangental with circular reasoning.  No goals stated Time For Goal Achievement: 10/27/19 Potential to Achieve Goals: Good Progress towards PT goals: Progressing toward goals    Frequency    Min  2X/week      PT Plan Current plan remains appropriate       AM-PAC PT "6 Clicks" Mobility   Outcome Measure  Help needed turning from your back to your side while in a flat bed without using bedrails?: None Help needed moving from lying on your back to sitting on the side of a flat bed without using bedrails?: None Help needed moving to and from a bed to a chair (including a wheelchair)?: None Help needed standing up from a chair using your arms (e.g., wheelchair or bedside chair)?: None Help needed to walk in hospital room?: None Help needed climbing 3-5 steps with a railing? : A Little 6 Click Score: 23    End of Session   Activity Tolerance: Patient limited by fatigue Patient left: in bed;with call bell/phone within reach Nurse Communication: Mobility status PT Visit Diagnosis: Other abnormalities of gait and mobility (R26.89);Muscle weakness (generalized) (M62.81)     Time: 1123-1200(did not charge for time spent convincing pt to move) PT Time Calculation (min) (ACUTE ONLY): 37 min  Charges:  $Gait Training: 8-22 mins                    Verdene Lennert, PT, DPT  Acute Rehabilitation [phone] pager #[phone] office  @ [facility]: [phone]   10/13/2019, 2:46 PM

## 2019-10-14 ENCOUNTER — Inpatient Hospital Stay (HOSPITAL_COMMUNITY): Payer: 59

## 2019-10-14 DIAGNOSIS — J1289 Other viral pneumonia: Secondary | ICD-10-CM | POA: Diagnosis not present

## 2019-10-14 DIAGNOSIS — U071 COVID-19: Secondary | ICD-10-CM | POA: Diagnosis not present

## 2019-10-14 LAB — GLUCOSE, CAPILLARY
Glucose-Capillary: 100 mg/dL — ABNORMAL HIGH (ref 70–99)
Glucose-Capillary: 165 mg/dL — ABNORMAL HIGH (ref 70–99)
Glucose-Capillary: 121 mg/dL — ABNORMAL HIGH (ref 70–99)
Glucose-Capillary: 155 mg/dL — ABNORMAL HIGH (ref 70–99)

## 2019-10-14 NOTE — Progress Notes (Signed)
Physical Therapy Treatment/Vestibular Assessment Patient Details Name: Maxwell Miller MRN: [id number] DOB: February 27, 1961 Today's Date: 10/14/2019    History of Present Illness 58 y.o. male admitted on 09/17/19 for elevated blood sugars, fever (105), and AMS.  Pt was previously dx with COVID 19 on 09/09/19.  Pt dx this admission with acute metabolic encephalopathy in the setting of underlying vascular dementia. Pt with other significant PMH of non-traumatic ICH, HTN.      PT Comments    Pt describing to me symptoms of "dizziness" when sitting or standing that dissipates as he sits or stands longer.  He had no occular symptoms of vertigo, did not have any nystagmus or reports of dizziness with rolling in the bed or transitioning from supine to sit.  He had negative orthostatic vitals (missed supine).  I feel his symptoms are likely related to the amount of time he spends in the bed vs in an upright position (walking or even OOB to the chair).  I believe these symptoms would improved with increased frequency of upright time (OOB to chair TID for meals+1 hour).  He does not appear to have a vestibular issue.  PT will continue to follow acutely for safe mobility progression.   10/14/19 1546  Vital Signs  Patient Position (if appropriate) Orthostatic Vitals  Orthostatic Lying   BP- Lying  (did not catch before getting to EOB. )  Orthostatic Sitting  BP- Sitting 135/88  Orthostatic Standing at 0 minutes  BP- Standing at 0 minutes 145/90  Orthostatic Standing at 3 minutes  BP- Standing at 3 minutes 152/85     Follow Up Recommendations  SNF     Equipment Recommendations  None recommended by PT    Recommendations for Other Services   NA     Precautions / Restrictions Precautions Precautions: Fall Precaution Comments: mildly unsteady Restrictions Weight Bearing Restrictions: No    Mobility  Bed Mobility Overal bed mobility: Modified Independent                 Transfers Overall transfer level: Needs assistance Equipment used: None Transfers: Sit to/from Stand Sit to Stand: Supervision         General transfer comment: supervision for safety  Ambulation/Gait Ambulation/Gait assistance: Min guard;Supervision Gait Distance (Feet): 200 Feet Assistive device: None Gait Pattern/deviations: Step-through pattern;Antalgic;Staggering right;Staggering left Gait velocity: decreased Gait velocity interpretation: 1.31 - 2.62 ft/sec, indicative of limited community ambulator General Gait Details: Pt with staggering gait pattern, moderately antalgic today reporting bil knee pain R>L.  Staggering gait pattern requiring close supervision to min guard assist for safety in the hallway.           Balance Overall balance assessment: Needs assistance Sitting-balance support: Feet supported;No upper extremity supported Sitting balance-Leahy Scale: Good     Standing balance support: No upper extremity supported Standing balance-Leahy Scale: Fair                              Cognition Arousal/Alertness: Awake/alert Behavior During Therapy: WFL for tasks assessed/performed Overall Cognitive Status: History of cognitive impairments - at baseline                                 General Comments: Pt with h/o vascualr dementia, poor insight into deficits, reasoning, and safety are both impaired.       Exercises      General Comments  General comments (skin integrity, edema, etc.): As therapist and pt were building rapport it was easier to engage him in moving today.        Pertinent Vitals/Pain Pain Assessment: No/denies pain           PT Goals (current goals can now be found in the care plan section) Acute Rehab PT Goals Patient Stated Goal: Pt is tangental with circular reasoning.  No goals stated Progress towards PT goals: Progressing toward goals    Frequency    Min 2X/week      PT Plan Current plan  remains appropriate       AM-PAC PT "6 Clicks" Mobility   Outcome Measure  Help needed turning from your back to your side while in a flat bed without using bedrails?: None Help needed moving from lying on your back to sitting on the side of a flat bed without using bedrails?: None Help needed moving to and from a bed to a chair (including a wheelchair)?: None Help needed standing up from a chair using your arms (e.g., wheelchair or bedside chair)?: None Help needed to walk in hospital room?: A Little Help needed climbing 3-5 steps with a railing? : A Little 6 Click Score: 22    End of Session   Activity Tolerance: Patient limited by fatigue;Patient limited by pain Patient left: in chair;with call bell/phone within reach Nurse Communication: Mobility status PT Visit Diagnosis: Other abnormalities of gait and mobility (R26.89);Muscle weakness (generalized) (M62.81)     Time: 1445-1530 PT Time Calculation (min) (ACUTE ONLY): 45 min  Charges:             Corinna Capra, PT, DPT  Acute Rehabilitation [phone] pager #[phone] office  @ [facility]: [phone]

## 2019-10-14 NOTE — Progress Notes (Signed)
PROGRESS NOTE    Maxwell Miller  DQQ:[id number] DOB: [birth year] DOA: 09/17/2019 PCP: Maxwell Paris, PA-C   Brief Narrative:  58 year old [place] [facility] resident with Maxwell Miller history of vascular dementia, nontraumatic intracerebral hemorrhage, portal vein thrombosis on chronic Xarelto, and Maxwell Miller diagnosis of COVID-19 10/28 who presented to the ED with 24 hours of severely elevated blood sugars, fever to 105, and altered mental status.  He has been successfully treated for his COVID-19 infection, he is at his baseline now which is pleasantly confused.  He awaits an negative Covid test for placement purposes.  Repeat test ordered on 10/13/2019.  This will be his third test.  Significant Events: 11/5 admit to [facility] ICU via [facility] long ED 11/6 transfer to progressive care unit  COVID-19 specific Treatment: Remdesivir 11/5 > Decadron 11/5 >  Assessment & Plan:   Active Problems:   Sepsis (HCC)   Malnutrition of moderate degree   Hypertension   Vascular dementia without behavioral disturbance (HCC)   Portal vein thrombosis   AKI (acute kidney injury) (HCC)   Dehydration   Respiratory failure with hypoxia (HCC)   Acute metabolic encephalopathy   Pneumonia due to COVID-19 virus  Pneumonia due to COVID-19 Patient presented with high fever with Maxwell Miller temperature of 105.4 F.  He was encephalopathic.  He has completed course of remdesivir (11/9).  Completed steroids as well (11/16).  This problem has resolved clinically and he is symptom-free from the standpoint.  Repeat Covid test for placement purposes ordered again on 10/13/2019.  This was negative. Repeat on 12/2.    COVID-19 Labs  No results for input(s): DDIMER, FERRITIN, LDH, CRP in the last 72 hours.  Lab Results  Component Value Date   SARSCOV2NAA NEGATIVE 10/13/2019   SARSCOV2NAA POSITIVE (Maxwell Miller) 10/07/2019   SARSCOV2NAA POSITIVE (Maxwell Miller) 09/17/2019   SARSCOV2NAA NOT DETECTED 03/30/2019   Dizziness: pt c/o dizziness when  standing (he notes this has been present since he's been here, but Maxwell Miller bit difficult to get specifics from him).  No focal neuro deficit on exam.  Did not cooperate with orthostatics earlier today.  Will continue to monitor at this time.  Discussed with therapy who will try to evaluate him today.  Had head CT on presentation which was unremarkable.  SIRS/lactic acidosis Due to the above.  Now resolved.  Acute metabolic encephalopathy -Patient with underlying baseline vascular dementia at baseline. No acute findings on CT head.  This was thought to be mainly in the setting of acute COVID-19 infection, electrolyte derangement and hypernatremia.  Patient also was dehydrated and so he was given IV fluids.  He did have elevated ammonia level initially but this has normalized without intervention.  B12 and folate levels were normal.  He is back to his baseline with baseline dementia and pleasant confusion.  Hypomagnesemia - was replaced and now stable.  HTN - BP running high, have placed on moderate dose Coreg and full dose Norvasc.  Monitor and adjust.  As needed IV labetalol.  Hyperammonemia - Normalized without intervention.  History of portal vein thrombosis - Chronically on rivaroxaban.  Was on Lovenox here due to poor oral intake.  Now back on rivaroxaban.  Acute kidney injury - This was due to volume depletion and dehydration.  Resolved with IV fluids.    Polycythemia- This appears to be chronic.  Dysphagia - Resolved, SLP input appreciated  Diabetes mellitus type 2, uncontrolled with hyperglycemia, new diagnosis - This appears to be Maxwell Miller new diagnosis for him.  Poor  outpatient glycemic control due to hyperglycemia with A1c of 12.8.  Currently on Lantus and sliding scale will go to SNF on it.    DVT prophylaxis: xarelto Code Status: CPR, but no intubation Family Communication: none at bedside Disposition Plan: pending  Consultants:   PCCM  Procedures:   none   Antimicrobials: Anti-infectives (From admission, onward)   Start     Dose/Rate Route Frequency Ordered Stop   09/18/19 1000  remdesivir 100 mg in sodium chloride 0.9 % 250 mL IVPB     100 mg 500 mL/hr over 30 Minutes Intravenous Every 24 hours 09/17/19 1117 09/21/19 1055   09/17/19 1200  remdesivir 200 mg in sodium chloride 0.9 % 250 mL IVPB     200 mg 500 mL/hr over 30 Minutes Intravenous Once 09/17/19 1117 09/17/19 1249   09/17/19 0745  ceFEPIme (MAXIPIME) 2 g in sodium chloride 0.9 % 100 mL IVPB     2 g 200 mL/hr over 30 Minutes Intravenous  Once 09/17/19 0734 09/17/19 0837   09/17/19 0745  vancomycin (VANCOCIN) 1,750 mg in sodium chloride 0.9 % 500 mL IVPB     1,750 mg 250 mL/hr over 120 Minutes Intravenous  Once 09/17/19 0734 09/17/19 1104      Subjective: Maxwell Miller little difficult to interview Very specific about phrasing of questions/statements Notes dizziness when standing - this has been present since hie's been here When I say, "let me know if you need anything", he goes on about what specifically I mean - focusing on "anything" and says, if I ask for 100,000 dollars, would that be ok.  He makes other comments similar to that as well based on other things that I say.  Objective: Vitals:   10/13/19 1546 10/13/19 2100 10/14/19 0400 10/14/19 0803  BP: 111/69 129/82 139/85 133/90  Pulse: 79 73 66 75  Resp: Temp: 97.8 F (36.6 C) 98.8 F (37.1 C) 98.3 F (36.8 C) 97.9 F (36.6 C)  TempSrc: Oral Oral Oral Oral  SpO2: 100% 100% 100% 100%  Weight:      Height:        Intake/Output Summary (Last 24 hours) at 10/14/2019 1443 Last data filed at 10/14/2019 1342 Gross per 24 hour  Intake 420 ml  Output 1575 ml  Net -1155 ml   Filed Weights   09/17/19 0822 09/17/19 2200  Weight: (S) 81.6 kg 98.4 kg    Examination:  General exam: Appears calm and comfortable  Respiratory system: unlabored Cardiovascular system: RRR Gastrointestinal system: Abdomen is  nondistended, soft and nontender.  Central nervous system: Alert and oriented. CN 2-12 intact.  5/5 strength upper and lower extremities.  FNF intact. Extremities: no LEE Skin: No rashes, lesions or ulcers    Data Reviewed: I have personally reviewed following labs and imaging studies  CBC: Recent Labs  Lab 10/11/19 0637  WBC 5.4  HGB 15.3  HCT 45.9  MCV 86.8  PLT 123*   Basic Metabolic Panel: Recent Labs  Lab 10/11/19 0637 10/11/19 1650  NA 135  --   K 3.9  --   CL 103  --   CO2 27  --   GLUCOSE 105*  --   BUN 11  --   CREATININE 1.19  --   CALCIUM 8.5*  --   MG 1.6* 2.1   GFR: Estimated Creatinine Clearance: 80.9 mL/min (by C-G formula based on SCr of 1.19 mg/dL). Liver Function Tests: Recent Labs  Lab 10/11/19 0637  AST  24  ALT 27  ALKPHOS 62  BILITOT 0.8  PROT 6.7  ALBUMIN 3.1*   No results for input(s): LIPASE, AMYLASE in the last 168 hours. No results for input(s): AMMONIA in the last 168 hours. Coagulation Profile: No results for input(s): INR, PROTIME in the last 168 hours. Cardiac Enzymes: No results for input(s): CKTOTAL, CKMB, CKMBINDEX, TROPONINI in the last 168 hours. BNP (last 3 results) No results for input(s): PROBNP in the last 8760 hours. HbA1C: No results for input(s): HGBA1C in the last 72 hours. CBG: Recent Labs  Lab 10/13/19 0823 10/13/19 1150 10/13/19 1548 10/13/19 2137 10/14/19 0807  GLUCAP 96 132* 148* 157* 100*   Lipid Profile: No results for input(s): CHOL, HDL, LDLCALC, TRIG, CHOLHDL, LDLDIRECT in the last 72 hours. Thyroid Function Tests: No results for input(s): TSH, T4TOTAL, FREET4, T3FREE, THYROIDAB in the last 72 hours. Anemia Panel: No results for input(s): VITAMINB12, FOLATE, FERRITIN, TIBC, IRON, RETICCTPCT in the last 72 hours. Sepsis Labs: No results for input(s): PROCALCITON, LATICACIDVEN in the last 168 hours.  Recent Results (from the past 240 hour(s))  SARS CORONAVIRUS 2 (TAT 6-24 HRS)  Nasopharyngeal Nasopharyngeal Swab     Status: Abnormal   Collection Time: 10/07/19  1:00 PM   Specimen: Nasopharyngeal Swab  Result Value Ref Range Status   SARS Coronavirus 2 POSITIVE (Zaxton Angerer) NEGATIVE Final    Comment: RESULT CALLED TO, READ BACK BY AND VERIFIED WITH: K KOLB,RN [id number] 10/07/2019 D BRADLEY (NOTE) SARS-CoV-2 target nucleic acids are DETECTED. The SARS-CoV-2 RNA is generally detectable in upper and lower respiratory specimens during the acute phase of infection. Positive results are indicative of the presence of SARS-CoV-2 RNA. Clinical correlation with patient history and other diagnostic information is  necessary to determine patient infection status. Positive results do not rule out bacterial infection or co-infection with other viruses.  The expected result is Negative. Fact Sheet for Patients: HairSlick.nohttps://www.fda.gov/media/138098/download Fact Sheet for Healthcare Providers: quierodirigir.comhttps://www.fda.gov/media/138095/download This test is not yet approved or cleared by the [place] FDA and  has been authorized for detection and/or diagnosis of SARS-CoV-2 by FDA under an Emergency Use Authorization (EUA). This EUA will remain  in effect (meaning this test can be used) for the  duration of the COVID-19 declaration under Section 564(b)(1) of the Act, 21 U.S.C. section 360bbb-3(b)(1), unless the authorization is terminated or revoked sooner. Performed at [facility], [address] [place], [place] [address]   SARS CORONAVIRUS 2 (TAT 6-24 HRS) Nasopharyngeal Nasopharyngeal Swab     Status: None   Collection Time: 10/13/19  9:29 AM   Specimen: Nasopharyngeal Swab  Result Value Ref Range Status   SARS Coronavirus 2 NEGATIVE NEGATIVE Final    Comment: (NOTE) SARS-CoV-2 target nucleic acids are NOT DETECTED. The SARS-CoV-2 RNA is generally detectable in upper and lower respiratory specimens during the acute phase of infection. Negative results do not preclude SARS-CoV-2  infection, do not rule out co-infections with other pathogens, and should not be used as the sole basis for treatment or other patient management decisions. Negative results must be combined with clinical observations, patient history, and epidemiological information. The expected result is Negative. Fact Sheet for Patients: HairSlick.nohttps://www.fda.gov/media/138098/download Fact Sheet for Healthcare Providers: quierodirigir.comhttps://www.fda.gov/media/138095/download This test is not yet approved or cleared by the [place] FDA and  has been authorized for detection and/or diagnosis of SARS-CoV-2 by FDA under an Emergency Use Authorization (EUA). This EUA will remain  in effect (meaning this test can be used) for the  duration of the COVID-19 declaration under Section 56 4(b)(1) of the Act, 21 U.S.C. section 360bbb-3(b)(1), unless the authorization is terminated or revoked sooner. Performed at [facility], [place] [address], [place], [place] [address]          Radiology Studies: No results found.      Scheduled Meds: . amLODipine  10 mg Oral Daily  . carvedilol  3.125 mg Oral Once  . carvedilol  6.25 mg Oral BID WC  . famotidine  20 mg Oral Daily  . insulin aspart  0-15 Units Subcutaneous TID WC  . insulin aspart  0-5 Units Subcutaneous QHS  . insulin glargine  10 Units Subcutaneous Daily  . mouth rinse  15 mL Mouth Rinse BID  . multivitamin with minerals  1 tablet Oral Daily  . rivaroxaban  20 mg Oral Q supper  . thiamine  100 mg Oral Daily   Continuous Infusions:   LOS: 27 days    Time spent: over 30 min    Fayrene Helper, MD [facility] Pager [email or web address]  If 7PM-7AM, please contact night-coverage [email or web address] Password [facility] 10/14/2019, 2:43 PM

## 2019-10-14 NOTE — TOC Progression Note (Signed)
Transition of Care [facility]) - Progression Note    Patient Details  Name: Maxwell Miller MRN: [id number] Date of Birth: October 03, 1961  [facility] of [facility]) CM/SW Contact  Loletha Grayer Beverely Pace, RN Phone Number: 10/14/2019, 3:09 PM  Clinical Narrative:   Insurance auth for patient for [facility] is still pending.     Expected Discharge Plan: Skilled Nursing Facility Barriers to Discharge: Insurance Authorization  Expected Discharge Plan and Services Expected Discharge Plan: [place]         Expected Discharge Date: 09/28/19                                     Social Determinants of Health (SDOH) Interventions    Readmission Risk Interventions No flowsheet data found.

## 2019-10-15 DIAGNOSIS — U071 COVID-19: Secondary | ICD-10-CM | POA: Diagnosis not present

## 2019-10-15 DIAGNOSIS — J1289 Other viral pneumonia: Secondary | ICD-10-CM | POA: Diagnosis not present

## 2019-10-15 LAB — COMPREHENSIVE METABOLIC PANEL
ALT: 32 U/L (ref 0–44)
AST: 22 U/L (ref 15–41)
Albumin: 3.5 g/dL (ref 3.5–5.0)
Alkaline Phosphatase: 79 U/L (ref 38–126)
Anion gap: 9 (ref 5–15)
BUN: 12 mg/dL (ref 6–20)
CO2: 25 mmol/L (ref 22–32)
Calcium: 8.9 mg/dL (ref 8.9–10.3)
Chloride: 103 mmol/L (ref 98–111)
Creatinine, Ser: 1.16 mg/dL (ref 0.61–1.24)
GFR calc Af Amer: >60 mL/min (ref >60)
GFR calc non Af Amer: >60 mL/min (ref >60)
Glucose, Bld: 99 mg/dL (ref 70–99)
Potassium: 4 mmol/L (ref 3.5–5.1)
Sodium: 137 mmol/L (ref 135–145)
Total Bilirubin: 0.6 mg/dL (ref 0.3–1.2)
Total Protein: 7.3 g/dL (ref 6.5–8.1)

## 2019-10-15 LAB — CBC WITH DIFFERENTIAL/PLATELET
Abs Immature Granulocytes: 0.02 10*3/uL (ref 0.00–0.07)
Basophils Absolute: 0 10*3/uL (ref 0.0–0.1)
Basophils Relative: 1 %
Eosinophils Absolute: 0.3 10*3/uL (ref 0.0–0.5)
Eosinophils Relative: 6 %
HCT: 49 % (ref 39.0–52.0)
Hemoglobin: 16.2 g/dL (ref 13.0–17.0)
Immature Granulocytes: 0 %
Lymphocytes Relative: 45 %
Lymphs Abs: 2.4 10*3/uL (ref 0.7–4.0)
MCH: 28.6 pg (ref 26.0–34.0)
MCHC: 33.1 g/dL (ref 30.0–36.0)
MCV: 86.6 fL (ref 80.0–100.0)
Monocytes Absolute: 0.8 10*3/uL (ref 0.1–1.0)
Monocytes Relative: 15 %
Neutro Abs: 1.7 10*3/uL (ref 1.7–7.7)
Neutrophils Relative %: 33 %
Platelets: 129 10*3/uL — ABNORMAL LOW (ref 150–400)
RBC: 5.66 MIL/uL (ref 4.22–5.81)
RDW: 15.3 % (ref 11.5–15.5)
WBC: 5.2 10*3/uL (ref 4.0–10.5)
nRBC: 0 % (ref 0.0–0.2)

## 2019-10-15 LAB — GLUCOSE, CAPILLARY
Glucose-Capillary: 106 mg/dL — ABNORMAL HIGH (ref 70–99)
Glucose-Capillary: 198 mg/dL — ABNORMAL HIGH (ref 70–99)
Glucose-Capillary: 154 mg/dL — ABNORMAL HIGH (ref 70–99)
Glucose-Capillary: 183 mg/dL — ABNORMAL HIGH (ref 70–99)

## 2019-10-15 LAB — MAGNESIUM: Magnesium: 1.7 mg/dL (ref 1.7–2.4)

## 2019-10-15 LAB — SARS CORONAVIRUS 2 (TAT 6-24 HRS): SARS Coronavirus 2: NEGATIVE

## 2019-10-15 NOTE — Progress Notes (Signed)
Occupational Therapy Treatment Patient Details Name: Maxwell Miller MRN: [id number] DOB: [birth year] Today's Date: 10/15/2019    History of present illness 58 y.o. male admitted on 09/17/19 for elevated blood sugars, fever (105), and AMS.  Pt was previously dx with COVID 19 on 09/09/19.  Pt dx this admission with acute metabolic encephalopathy in the setting of underlying vascular dementia. Pt with other significant PMH of non-traumatic ICH, HTN.     OT comments  Pt pleasant and cooperative. Pt ambulated to bathroom then ambulated @ 200 ft in hallway with S. Noted mild balance deficits but pt able to self correct. Pt demonstrating improvement with cognition - most likely close to baseline. Pt is "ready to get out of the hospital". Goals updated. Recommend return to SNF for rehab. Will continue to follow.   Follow Up Recommendations  SNF(return to SNF)    Equipment Recommendations  None recommended by OT    Recommendations for Other Services      Precautions / Restrictions Precautions Precautions: Fall       Mobility Bed Mobility Overal bed mobility: Modified Independent                Transfers Overall transfer level: Needs assistance Equipment used: None Transfers: Sit to/from Stand Sit to Stand: Modified independent (Device/Increase time)         General transfer comment: supervision for mobility    Balance Overall balance assessment: Needs assistance   Sitting balance-Leahy Scale: Good                                     ADL either performed or assessed with clinical judgement   ADL Overall ADL's : Needs assistance/impaired Eating/Feeding: Independent   Grooming: Set up;Sitting   Upper Body Bathing: Set up;Sitting   Lower Body Bathing: Supervison/ safety;Sit to/from stand;Set up   Upper Body Dressing : Set up;Sitting   Lower Body Dressing: Min guard;Sit to/from stand   Toilet Transfer: Supervision/safety;Ambulation   Toileting-  Clothing Manipulation and Hygiene: Moderate assistance Toileting - Clothing Manipulation Details (indicate cue type and reason): incontinent     Functional mobility during ADLs: Supervision/safety       Vision       Perception     Praxis      Cognition Arousal/Alertness: Awake/alert Behavior During Therapy: WFL for tasks assessed/performed Overall Cognitive Status: History of cognitive impairments - at baseline                                          Exercises     Shoulder Instructions       General Comments Pt ambulated @ 200 ft in hallway with S. Noted swaying and LOB with head tiurns, but pt able to recover independently    Pertinent Vitals/ Pain       Pain Assessment: No/denies pain  Home Living                                          Prior Functioning/Environment              Frequency  Min 2X/week        Progress Toward Goals  OT Goals(current goals can now be found in  the care plan section)  Progress towards OT goals: Goals met and updated - see care plan  Acute Rehab OT Goals Patient Stated Goal: to get out of the hospital OT Goal Formulation: With patient Time For Goal Achievement: 10/29/19 Potential to Achieve Goals: Good ADL Goals Pt Will Perform Upper Body Bathing: with set-up;sitting Pt Will Perform Lower Body Bathing: with set-up;sit to/from stand Pt Will Transfer to Toilet: with modified independence Pt/caregiver will Perform Home Exercise Program: Increased strength;With theraband;With Supervision;With written HEP provided  Plan Discharge plan remains appropriate    Co-evaluation                 AM-PAC OT "6 Clicks" Daily Activity     Outcome Measure   Help from another person eating meals?: None Help from another person taking care of personal grooming?: A Little Help from another person toileting, which includes using toliet, bedpan, or urinal?: A Little Help from another person  bathing (including washing, rinsing, drying)?: A Little Help from another person to put on and taking off regular upper body clothing?: A Little Help from another person to put on and taking off regular lower body clothing?: A Little 6 Click Score: 19    End of Session    OT Visit Diagnosis: Unsteadiness on feet (R26.81);Muscle weakness (generalized) (M62.81)   Activity Tolerance Patient tolerated treatment well   Patient Left in chair;with call bell/phone within reach   Nurse Communication Mobility status        Time: 1601-0932 OT Time Calculation (min): 25 min  Charges: OT General Charges $OT Visit: 1 Visit OT Treatments $Self Care/Home Management : 23-37 mins  Maurie Boettcher, OT/L   Acute OT Clinical Specialist [place] Pager [phone] Office [phone]    [facility] 10/15/2019, 2:36 PM

## 2019-10-15 NOTE — TOC Progression Note (Signed)
Transition of Care [facility]) - Progression Note    Patient Details  Name: Maxwell Miller MRN: [id number] Date of Birth: [birth year]  Transition [facility] Care [facility]) CM/SW Contact  Rae Mar, RN Phone Number: 10/15/2019, 3:34 PM  Clinical Narrative:     Gerald Stabs at [facility] advised that insurance requested a peer to peer review due to a denial one week ago. Updated Dr. Florene Glen who requested what the denial was for. During chart review it was noted that pt is LTC SNF paid likely through Medicaid.  Gerald Stabs declined to take a LTC medicaid.  Discussed case with Kathlee Nations who will contact pt's residential SNF [facility] to discuss pt's return. He has had 2 neg covid tests at this time.  TOC team continuing to follow.  Expected Discharge Plan: Skilled Nursing Facility Barriers to Discharge: Insurance Authorization  Expected Discharge Plan and Services Expected Discharge Plan: [place]         Expected Discharge Date: 09/28/19                                     Social Determinants of Health (SDOH) Interventions    Readmission Risk Interventions No flowsheet data found.

## 2019-10-15 NOTE — Progress Notes (Signed)
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  He hs been successfully treted for his COVID-19 infection, he is t his bseline now which is plesntly confused.  He wits n negtive Covid test for plcement purposes.  Repet test ordered on 10/13/2019.  This will be his third test.  Significnt Events: 11/5 dmit to [facility]  ICU vi [facility]  long ED 11/6 trnsfer to progressive cre unit  COVID-19 specific Tretment: Remdesivir 11/5 > Decdron 11/5 >  ssessment & Pln:   ctive Problems:   Sepsis (HCC)   Mlnutrition of moderte degree   Hypertension   Vulr dementi without behviorl disturbnce (HCC)   Portl vein thrombosis   KI (cute kidney injury) (HCC)   Dehydrtion   Respirtory filure with hypoxi (HCC)   cute metbolic encephlopthy   Pneumoni due to COVID-19 virus  Pneumoni due to COVID-19 Ptient presented with high fever with  temperture of 105.4 F.  He ws encephlopthic.  He hs completed course of remdesivir (11/9).  Completed steroids s well (11/16).  This problem hs resolved cliniclly nd he is symptom-free from the stndpoint.  He's hd 2 negtive COVID 19 tests.  12/1 nd 12/2.  Cn come off of isoltion fter dihrge.  COVID-19 Lbs  No results for input(s): DDIMER, FERRITIN, LDH, CRP in the lst 72 hours.  Lb Results  Component Vlue Dte   SRSCOV2N NEGTIVE 10/14/2019   SRSCOV2N NEGTIVE 10/13/2019   SRSCOV2N POSITIVE () 10/07/2019   SRSCOV2N POSITIVE () 09/17/2019   Dizziness: pt c/o dizziness when stnding (he notes  this hs been present since he's been here, but  bit difficult to get specifics from him).  No focl neuro deficit on exm.  Orthosttics negtive.  pprecite therpy evl t it ppers tht he did not hve ny recurrent vertigo while working with therpy.  ttempted hed CT yesterdy, but pt refused.  Bsed on no sx while working with therpy, no need for dditionl w/u t this time.  Continue to encourge OOB..  Will continue to monitor t this time.    SIRS/lctic cidosis Due to the bove.  Now resolved.  cute metbolic encephlopthy -Ptient with underlying bseline vulr dementi t bseline. No cute findings on CT hed.  This ws thought to be minly in the setting of cute COVID-19 infection, electrolyte derngement nd hyperntremi.  Ptient lso ws dehydrted nd so he ws given IV fluids.  He did hve elevted mmoni level initilly but this hs normlized without intervention.  B12 nd folte levels were norml.  He is bck to his bseline with bseline dementi nd plesnt confusion.  Hypomgnesemi - ws replced nd now stble.  HTN - BP running high, hve plced on moderte dose Coreg nd full dose Norv.  Monitor nd djust.  s needed IV lbetlol.  Hypermmonemi - Normlized without intervention.  History of portl vein thrombosis - Chroniclly on rivroxbn.  Ws on Lovenox here due to poor orl intke.  Now bck on rivroxbn.  cute kidney injury - This ws due to volume depletion nd dehydrtion.  Resolved with IV fluids.    Polycythemi- This ppers to be chronic.  Dysphgi - Resolved, **Note De-Identified vi Obfusction** SLP input pprecited  Dibetes mellitus type 2, uncontrolled with hyperglycemi, new dignosis - This ppers to be  new dignosis for him.  Poor outptient glycemic control due to hyperglycemi with 1c of 12.8.  Currently on Lntus nd sliding scle will go to SNF on it.    DVT prophylxis: xrelto Code Sttus: CPR, but no intubtion Fmily  Communiction: none t bedside Disposition Pln: pending SNF plcement  Consultnts:   PCCM  Procedures:   none  ntimicrobils: nti-infectives (From dmission, onwrd)   Strt     Dose/Rte Route Frequency Ordered Stop   09/18/19 1000  remdesivir 100 mg in sodium chloride 0.9 % 250 mL IVPB     100 mg 500 mL/hr over 30 Minutes Intrvenous Every 24 hours 09/17/19 1117 09/21/19 1055   09/17/19 1200  remdesivir 200 mg in sodium chloride 0.9 % 250 mL IVPB     200 mg 500 mL/hr over 30 Minutes Intrvenous Once 09/17/19 1117 09/17/19 1249   09/17/19 0745  ceFEPIme (MXIPIME) 2 g in sodium chloride 0.9 % 100 mL IVPB     2 g 200 mL/hr over 30 Minutes Intrvenous  Once 09/17/19 0734 09/17/19 0837   09/17/19 0745  vncomycin (VNCOCIN) 1,750 mg in sodium chloride 0.9 % 500 mL IVPB     1,750 mg 250 mL/hr over 120 Minutes Intrvenous  Once 09/17/19 0734 09/17/19 1104      Subjective:  little difficult to interview Very specific bout phrsing of questions/sttements When I sk how he's doing -> he sys "you tell me" He ws sking  question tht I sked him to repet nd he repeted the question in spnish the 3rd time he repeted it. No cler c/o discomfort or complints sked for more sltines s his were crushed (obtined this for him)  Objective: Vitls:   10/14/19 1607 10/14/19 2040 10/15/19 0440 10/15/19 0800  BP: 132/86 114/74 122/78 138/89  Pulse: 78 79 80 78  Resp: 20 20 20 18   Temp: 98.4 F (36.9 C) 98.3 F (36.8 C) 98 F (36.7 C) 98.3 F (36.8 C)  TempSrc: Orl Orl Orl Orl  SpO2: 99% 100% 100% 97%  Weight:      Height:        Intke/Output Summry (Lst 24 hours) t 10/15/2019 1609 Lst dt filed t 10/15/2019 0600 Gross per 24 hour  Intke -  Output 1300 ml  Net -1300 ml   Filed Weights   09/17/19 0822 09/17/19 2200  Weight: (S) 81.6 kg 98.4 kg    Exmintion:  Generl: No cute distress. Crdiovsculr: RRR Lungs: unlbored bdomen: Soft,  nontender, nondistended Neurologicl: lert nd oriented 3. Moves ll extremities 4 . Crnil nerves II through XII grossly intct. Skin: Wrm nd dry. No rshes or lesions. Extremities: No clubbing or cynosis. No edem.      Dt Reviewed: I hve personlly reviewed following lbs nd imging studies  CBC: Recent Lbs  Lb 10/11/19 0637 10/15/19 0248  WBC 5.4 5.2  NEUTROBS  --  1.7  HGB 15.3 16.2  HCT 45.9 49.0  MCV 86.8 86.6  PLT 123* 119*   Bsic Metbolic Pnel: Recent Lbs  Lb 10/11/19 0637 10/11/19 1650 10/15/19 0248  N 135  --  137  K 3.9  --  4.0  CL 103  --  103  CO2 27  --  25  GLUCOSE 105*  --  99  BUN 11  --  12  CRETININE 1.19  --  1.16  CLCIUM 8.5*  --  8.9  MG 1.6* 2.1 1.7   GFR: Estimated Creatinine Clearance: 83 mL/min (by C-G formula based on SCr of 1.16 mg/dL). Liver Function Tests: Recent Labs  Lab 10/11/19 0637 10/15/19 0248  ST 24 22  LT 27 32  LKPHOS 62 79  BILITOT 0.8 0.6  PROT 6.7 7.3  LBUMIN 3.1* 3.5   No results for input(s): LIPSE, MYLSE in the last 168 hours. No results for input(s): MMONI in the last 168 hours. Coagulation Profile: No results for input(s): INR, PROTIME in the last 168 hours. Cardiac Enzymes: No results for input(s): CKTOTL, CKMB, CKMBINDEX, TROPONINI in the last 168 hours. BNP (last 3 results) No results for input(s): PROBNP in the last 8760 hours. Hb1C: No results for input(s): HGB1C in the last 72 hours. CBG: Recent Labs  Lab 10/14/19 1149 10/14/19 1706 10/14/19 2040 10/15/19 0818 10/15/19 1126  GLUCP 165* 121* 155* 106* 198*   Lipid Profile: No results for input(s): CHOL, HDL, LDLCLC, TRIG, CHOLHDL, LDLDIRECT in the last 72 hours. Thyroid Function Tests: No results for input(s): TSH, T4TOTL, FREET4, T3FREE, THYROIDB in the last 72 hours. nemia Panel: No results for input(s): VITMINB12, FOLTE, FERRITIN, TIBC, IRON, RETICCTPCT in the last 72 hours. Sepsis Labs: No  results for input(s): PROCLCITON, LTICCIDVEN in the last 168 hours.  Recent Results (from the past 240 hour(s))  SRS CORONVIRUS 2 (TT 6-24 HRS) Nasopharyngeal Nasopharyngeal Swab     Status: bnormal   Collection Time: 10/07/19  1:00 PM   Specimen: Nasopharyngeal Swab  Result Value Ref Range Status   SRS Coronavirus 2 POSITIVE () NEGTIVE Final    Comment: RESULT CLLED TO, RED BCK BY ND VERIFIED WITH: K KOLB,RN 1856 10/07/2019 D BRDLEY (NOTE) SRS-CoV-2 target nucleic acids are DETECTED. The SRS-CoV-2 RN is generally detectable in upper and lower respiratory specimens during the acute phase of infection. Positive results are indicative of the presence of SRS-CoV-2 RN. Clinical correlation with patient history and other diagnostic information is  necessary to determine patient infection status. Positive results do not rule out bacterial infection or co-infection with other viruses.  The expected result is Negative. Fact Sheet for Patients: HairSlick.nohttps://www.fda.gov/media/138098/download Fact Sheet for Healthcare Providers: quierodirigir.comhttps://www.fda.gov/media/138095/download This test is not yet approved or cleared by the [place] FD and  has been authorized for detection and/or diagnosis of SRS-CoV-2 by FD under an Emergency Use uthorization (EU). This EU will remain  in effect (meaning this test can be used) for the  duration of the COVID-19 declaration under Section 564(b)(1) of the ct, 21 U.S.C. section 360bbb-3(b)(1), unless the authorization is terminated or revoked sooner. Performed at [facility], [address] [place], [place] [address]   SRS CORONVIRUS 2 (TT 6-24 HRS) Nasopharyngeal Nasopharyngeal Swab     Status: None   Collection Time: 10/13/19  9:29 M   Specimen: Nasopharyngeal Swab  Result Value Ref Range Status   SRS Coronavirus 2 NEGTIVE NEGTIVE Final    Comment: (NOTE) SRS-CoV-2 target nucleic acids are NOT DETECTED. The SRS-CoV-2  RN is generally detectable in upper and lower respiratory specimens during the acute phase of infection. Negative results do not preclude SRS-CoV-2 infection, do not rule out co-infections with other pathogens, and should not be used as the sole basis for treatment or other patient management decisions. Negative results must be combined with clinical observations, patient history, and epidemiological information. The expected result is Negative. Fact Sheet for Patients: HairSlick.nohttps://www.fda.gov/media/138098/download Fact Sheet for Healthcare Providers: quierodirigir.comhttps://www.fda.gov/media/138095/download This test is not yet  approved or cleared by the [place] and  has been authorized for detection and/or diagnosis of SARS-CoV-2 by FDA under an Emergency Use Authorization (EUA). This EUA will remain  in effect (meaning this test can be used) for the duration of the COVID-19 declaration under Section 56 4(b)(1) of the Act, 21 U.S.C. section 360bbb-3(b)(1), unless the authorization is terminated or revoked sooner. Performed at [facility], [address] [place], [place] [address]   SARS CORONAVIRUS 2 (TAT 6-24 HRS) Nasopharyngeal Nasopharyngeal Swab     Status: None   Collection Time: 10/14/19  3:40 PM   Specimen: Nasopharyngeal Swab  Result Value Ref Range Status   SARS Coronavirus 2 NEGATIVE NEGATIVE Final    Comment: (NOTE) SARS-CoV-2 target nucleic acids are NOT DETECTED. The SARS-CoV-2 RNA is generally detectable in upper and lower respiratory specimens during the acute phase of infection. Negative results do not preclude SARS-CoV-2 infection, do not rule out co-infections with other pathogens, and should not be used as the sole basis for treatment or other patient management decisions. Negative results must be combined with clinical observations, patient history, and epidemiological information. The expected result is Negative. Fact Sheet for Patients:  HairSlick.no Fact Sheet for Healthcare Providers: quierodirigir.com This test is not yet approved or cleared by the [place] FDA and  has been authorized for detection and/or diagnosis of SARS-CoV-2 by FDA under an Emergency Use Authorization (EUA). This EUA will remain  in effect (meaning this test can be used) for the duration of the COVID-19 declaration under Section 56 4(b)(1) of the Act, 21 U.S.C. section 360bbb-3(b)(1), unless the authorization is terminated or revoked sooner. Performed at [facility], [address], [place], [place] [address]          Radiology Studies: No results found.      Scheduled Meds: . amLODipine  10 mg Oral Daily  . carvedilol  6.25 mg Oral BID WC  . famotidine  20 mg Oral Daily  . insulin aspart  0-15 Units Subcutaneous TID WC  . insulin aspart  0-5 Units Subcutaneous QHS  . insulin glargine  10 Units Subcutaneous Daily  . mouth rinse  15 mL Mouth Rinse BID  . multivitamin with minerals  1 tablet Oral Daily  . rivaroxaban  20 mg Oral Q supper  . thiamine  100 mg Oral Daily   Continuous Infusions:   LOS: 28 days    Time spent: over 30 min    Lacretia Nicks, MD [facility] Pager [email or web address]  If 7PM-7AM, please contact night-coverage [email or web address] Password [email or web address] 10/15/2019, 4:09 PM

## 2019-10-16 DIAGNOSIS — U071 COVID-19: Secondary | ICD-10-CM | POA: Diagnosis not present

## 2019-10-16 DIAGNOSIS — J1289 Other viral pneumonia: Secondary | ICD-10-CM | POA: Diagnosis not present

## 2019-10-16 LAB — GLUCOSE, CAPILLARY
Glucose-Capillary: 200 mg/dL — ABNORMAL HIGH (ref 70–99)
Glucose-Capillary: 162 mg/dL — ABNORMAL HIGH (ref 70–99)
Glucose-Capillary: 154 mg/dL — ABNORMAL HIGH (ref 70–99)

## 2019-10-16 NOTE — TOC Progression Note (Signed)
Transition of Care [facility]) - Progression Note    Patient Details  Name: Goldie Dimmer MRN: [id number] Date of Birth: [birth year]  [facility] CM/SW Gully, Dana Phone Number: 10/16/2019, 11:52 AM  Clinical Narrative:   CSW discussed with RNCM about patient's case, [facility] has denied due to patient being LTC and not appropriate for rehabilitation. Patient will need long term care, which [facility] cannot provide. CSW spoke with patient's niece, Valla Leaver, that [facility] has denied him and now he is COVID negative. Myrna still requesting that patient be moved to a facility closer to family, and Myrna provided information for a couple of facilities that the family would prefer. CSW contacted [facility], which had previously offered on patient awaiting a COVID bed, to see if they could take him for long term care. Left a voicemail for [facility].   CSW to contact [facility] and [employer], per niece's request. CSW also reached out to [facility], patient's previous LTC SNF, to ensure that they would be able to take patient back if need be. [place] [facility] is able to accept patient back if there are no other LTC facilities available for patient at discharge. CSW to follow.    Expected Discharge Plan: Skilled Nursing Facility Barriers to Discharge: Insurance Authorization  Expected Discharge Plan and Services Expected Discharge Plan: [place]         Expected Discharge Date: 09/28/19                                     Social Determinants of Health (SDOH) Interventions    Readmission Risk Interventions No flowsheet data found.

## 2019-10-16 NOTE — Progress Notes (Signed)
PROGRESS NOTE    Maxwell Miller  HYQ:[id number] DOB: 07-Mar-1961 DOA: 09/17/2019 PCP: Mable Paris, PA-C   Brief Narrative:  58 year old [place] [facility] resident with a history of vascular dementia, nontraumatic intracerebral hemorrhage, portal vein thrombosis on chronic Xarelto, and a diagnosis of COVID-19 10/28 who presented to the ED with 24 hours of severely elevated blood sugars, fever to 105, and altered mental status.  He has been successfully treated for his COVID-19 infection, he is at his baseline now which is pleasantly confused.  He awaits an negative Covid test for placement purposes.  Repeat test ordered on 10/13/2019.  This will be his third test.  Significant Events: 11/5 admit to [facility] ICU via [facility] long ED 11/6 transfer to progressive care unit  COVID-19 specific Treatment: Remdesivir 11/5 > Decadron 11/5 >  Assessment & Plan:   Active Problems:   Sepsis (HCC)   Malnutrition of moderate degree   Hypertension   Vascular dementia without behavioral disturbance (HCC)   Portal vein thrombosis   AKI (acute kidney injury) (HCC)   Dehydration   Respiratory failure with hypoxia (HCC)   Acute metabolic encephalopathy   Pneumonia due to COVID-19 virus  Pneumonia due to COVID-19 Patient presented with high fever with a temperature of 105.4 F.  He was encephalopathic.  He has completed course of remdesivir (11/9).  Completed steroids as well (11/16).  This problem has resolved clinically and he is symptom-free from the standpoint.  He's had 2 negative COVID 19 tests.  12/1 and 12/2.  Can come off of isolation after discharge.  COVID-19 Labs  No results for input(s): DDIMER, FERRITIN, LDH, CRP in the last 72 hours.  Lab Results  Component Value Date   SARSCOV2NAA NEGATIVE 10/14/2019   SARSCOV2NAA NEGATIVE 10/13/2019   SARSCOV2NAA POSITIVE (A) 10/07/2019   SARSCOV2NAA POSITIVE (A) 09/17/2019   Dizziness: pt c/o dizziness when standing (he notes  this has been present since he's been here, but a bit difficult to get specifics from him).  No focal neuro deficit on exam.  Orthostatics negative.  Appreciate therapy eval at it appears that he did not have any recurrent vertigo while working with therapy.  Attempted head CT yesterday, but pt refused.  Based on no sx while working with therapy, no need for additional w/u at this time.  Continue to encourage OOB..  Will continue to monitor at this time.    SIRS/lactic acidosis Due to the above.  Now resolved.  Acute metabolic encephalopathy -Patient with underlying baseline vascular dementia at baseline. No acute findings on CT head.  This was thought to be mainly in the setting of acute COVID-19 infection, electrolyte derangement and hypernatremia.  Patient also was dehydrated and so he was given IV fluids.  He did have elevated ammonia level initially but this has normalized without intervention.  B12 and folate levels were normal.  He is back to his baseline with baseline dementia and pleasant confusion.  Hypomagnesemia - was replaced and now stable.  HTN - BP running high, have placed on moderate dose Coreg and full dose Norvasc.  Monitor and adjust.  As needed IV labetalol.  Hyperammonemia - Normalized without intervention.  History of portal vein thrombosis - Chronically on rivaroxaban.  Was on Lovenox here due to poor oral intake.  Now back on rivaroxaban.  Acute kidney injury - This was due to volume depletion and dehydration.  Resolved with IV fluids.    Polycythemia- This appears to be chronic.  Dysphagia - Resolved,  SLP input appreciated  Diabetes mellitus type 2, uncontrolled with hyperglycemia, new diagnosis - This appears to be a new diagnosis for him.  Poor outpatient glycemic control due to hyperglycemia with A1c of 12.8.  Currently on Lantus and sliding scale will go to SNF on it.    DVT prophylaxis: xarelto Code Status: CPR, but no intubation Family  Communication: none at bedside Disposition Plan: pending SNF placement  Consultants:   PCCM  Procedures:   none  Antimicrobials: Anti-infectives (From admission, onward)   Start     Dose/Rate Route Frequency Ordered Stop   09/18/19 1000  remdesivir 100 mg in sodium chloride 0.9 % 250 mL IVPB     100 mg 500 mL/hr over 30 Minutes Intravenous Every 24 hours 09/17/19 1117 09/21/19 1055   09/17/19 1200  remdesivir 200 mg in sodium chloride 0.9 % 250 mL IVPB     200 mg 500 mL/hr over 30 Minutes Intravenous Once 09/17/19 1117 09/17/19 1249   09/17/19 0745  ceFEPIme (MAXIPIME) 2 g in sodium chloride 0.9 % 100 mL IVPB     2 g 200 mL/hr over 30 Minutes Intravenous  Once 09/17/19 0734 09/17/19 0837   09/17/19 0745  vancomycin (VANCOCIN) 1,750 mg in sodium chloride 0.9 % 500 mL IVPB     1,750 mg 250 mL/hr over 120 Minutes Intravenous  Once 09/17/19 0734 09/17/19 1104      Subjective: No new complaints  Objective: Vitals:   10/16/19 0424 10/16/19 0535 10/16/19 0930 10/16/19 1614  BP: (!) 128/91 136/87 128/72 (!) 107/56  Pulse: 80 77 84 89  Resp: 18 18 18 16   Temp: 98.2 F (36.8 C) 98.5 F (36.9 C) 98.7 F (37.1 C) 98.4 F (36.9 C)  TempSrc: Oral Oral Oral Oral  SpO2: 100% 99% 100% 100%  Weight:      Height:        Intake/Output Summary (Last 24 hours) at 10/16/2019 1619 Last data filed at 10/16/2019 0941 Gross per 24 hour  Intake 350 ml  Output 1425 ml  Net -1075 ml   Filed Weights   09/17/19 0822 09/17/19 2200  Weight: (S) 81.6 kg 98.4 kg    Examination:  General: No acute distress. Lungs: unlabored. Neurological: Alert. Moves all extremities 4 . Cranial nerves II through XII grossly intact. Skin: Warm and dry. No rashes or lesions. Extremities: No clubbing or cyanosis. No edema.   Data Reviewed: I have personally reviewed following labs and imaging studies  CBC: Recent Labs  Lab 10/11/19 0637 10/15/19 0248  WBC 5.4 5.2  NEUTROABS  --  1.7  HGB 15.3  16.2  HCT 45.9 49.0  MCV 86.8 86.6  PLT 123* 129*   Basic Metabolic Panel: Recent Labs  Lab 10/11/19 0637 10/11/19 1650 10/15/19 0248  NA 135  --  137  K 3.9  --  4.0  CL 103  --  103  CO2 27  --  25  GLUCOSE 105*  --  99  BUN 11  --  12  CREATININE 1.19  --  1.16  CALCIUM 8.5*  --  8.9  MG 1.6* 2.1 1.7   GFR: Estimated Creatinine Clearance: 83 mL/min (by C-G formula based on SCr of 1.16 mg/dL). Liver Function Tests: Recent Labs  Lab 10/11/19 0637 10/15/19 0248  AST 24 22  ALT 27 32  ALKPHOS 62 79  BILITOT 0.8 0.6  PROT 6.7 7.3  ALBUMIN 3.1* 3.5   No results for input(s): LIPASE, AMYLASE in the last  168 hours. No results for input(s): AMMONIA in the last 168 hours. Coagulation Profile: No results for input(s): INR, PROTIME in the last 168 hours. Cardiac Enzymes: No results for input(s): CKTOTAL, CKMB, CKMBINDEX, TROPONINI in the last 168 hours. BNP (last 3 results) No results for input(s): PROBNP in the last 8760 hours. HbA1C: No results for input(s): HGBA1C in the last 72 hours. CBG: Recent Labs  Lab 10/15/19 1126 10/15/19 1648 10/15/19 2053 10/16/19 0935 10/16/19 1205  GLUCAP 198* 154* 183* 200* 162*   Lipid Profile: No results for input(s): CHOL, HDL, LDLCALC, TRIG, CHOLHDL, LDLDIRECT in the last 72 hours. Thyroid Function Tests: No results for input(s): TSH, T4TOTAL, FREET4, T3FREE, THYROIDAB in the last 72 hours. Anemia Panel: No results for input(s): VITAMINB12, FOLATE, FERRITIN, TIBC, IRON, RETICCTPCT in the last 72 hours. Sepsis Labs: No results for input(s): PROCALCITON, LATICACIDVEN in the last 168 hours.  Recent Results (from the past 240 hour(s))  SARS CORONAVIRUS 2 (TAT 6-24 HRS) Nasopharyngeal Nasopharyngeal Swab     Status: Abnormal   Collection Time: 10/07/19  1:00 PM   Specimen: Nasopharyngeal Swab  Result Value Ref Range Status   SARS Coronavirus 2 POSITIVE (A) NEGATIVE Final    Comment: RESULT CALLED TO, READ BACK BY AND  VERIFIED WITH: K KOLB,RN [id number] 10/07/2019 D BRADLEY (NOTE) SARS-CoV-2 target nucleic acids are DETECTED. The SARS-CoV-2 RNA is generally detectable in upper and lower respiratory specimens during the acute phase of infection. Positive results are indicative of the presence of SARS-CoV-2 RNA. Clinical correlation with patient history and other diagnostic information is  necessary to determine patient infection status. Positive results do not rule out bacterial infection or co-infection with other viruses.  The expected result is Negative. Fact Sheet for Patients: SugarRoll.be Fact Sheet for Healthcare Providers: https://www.woods-mathews.com/ This test is not yet approved or cleared by the [place] FDA and  has been authorized for detection and/or diagnosis of SARS-CoV-2 by FDA under an Emergency Use Authorization (EUA). This EUA will remain  in effect (meaning this test can be used) for the  duration of the COVID-19 declaration under Section 564(b)(1) of the Act, 21 U.S.C. section 360bbb-3(b)(1), unless the authorization is terminated or revoked sooner. Performed at [facility], [place] [address], [place], [place] [address]   SARS CORONAVIRUS 2 (TAT 6-24 HRS) Nasopharyngeal Nasopharyngeal Swab     Status: None   Collection Time: 10/13/19  9:29 AM   Specimen: Nasopharyngeal Swab  Result Value Ref Range Status   SARS Coronavirus 2 NEGATIVE NEGATIVE Final    Comment: (NOTE) SARS-CoV-2 target nucleic acids are NOT DETECTED. The SARS-CoV-2 RNA is generally detectable in upper and lower respiratory specimens during the acute phase of infection. Negative results do not preclude SARS-CoV-2 infection, do not rule out co-infections with other pathogens, and should not be used as the sole basis for treatment or other patient management decisions. Negative results must be combined with clinical observations, patient history, and  epidemiological information. The expected result is Negative. Fact Sheet for Patients: SugarRoll.be Fact Sheet for Healthcare Providers: https://www.woods-mathews.com/ This test is not yet approved or cleared by the [place] FDA and  has been authorized for detection and/or diagnosis of SARS-CoV-2 by FDA under an Emergency Use Authorization (EUA). This EUA will remain  in effect (meaning this test can be used) for the duration of the COVID-19 declaration under Section 56 4(b)(1) of the Act, 21 U.S.C. section 360bbb-3(b)(1), unless the authorization is terminated or revoked sooner. Performed at [facility]  [facility], [address] [place], [place] [address]   SARS CORONAVIRUS 2 (TAT 6-24 HRS) Nasopharyngeal Nasopharyngeal Swab     Status: None   Collection Time: 10/14/19  3:40 PM   Specimen: Nasopharyngeal Swab  Result Value Ref Range Status   SARS Coronavirus 2 NEGATIVE NEGATIVE Final    Comment: (NOTE) SARS-CoV-2 target nucleic acids are NOT DETECTED. The SARS-CoV-2 RNA is generally detectable in upper and lower respiratory specimens during the acute phase of infection. Negative results do not preclude SARS-CoV-2 infection, do not rule out co-infections with other pathogens, and should not be used as the sole basis for treatment or other patient management decisions. Negative results must be combined with clinical observations, patient history, and epidemiological information. The expected result is Negative. Fact Sheet for Patients: HairSlick.no Fact Sheet for Healthcare Providers: quierodirigir.com This test is not yet approved or cleared by the [place] FDA and  has been authorized for detection and/or diagnosis of SARS-CoV-2 by FDA under an Emergency Use Authorization (EUA). This EUA will remain  in effect (meaning this test can be used) for the duration of the COVID-19  declaration under Section 56 4(b)(1) of the Act, 21 U.S.C. section 360bbb-3(b)(1), unless the authorization is terminated or revoked sooner. Performed at [facility], [address] [place], [place] [address]          Radiology Studies: No results found.      Scheduled Meds: . amLODipine  10 mg Oral Daily  . carvedilol  6.25 mg Oral BID WC  . famotidine  20 mg Oral Daily  . insulin aspart  0-15 Units Subcutaneous TID WC  . insulin aspart  0-5 Units Subcutaneous QHS  . insulin glargine  10 Units Subcutaneous Daily  . mouth rinse  15 mL Mouth Rinse BID  . multivitamin with minerals  1 tablet Oral Daily  . rivaroxaban  20 mg Oral Q supper  . thiamine  100 mg Oral Daily   Continuous Infusions:   LOS: 29 days    Time spent: over 30 min    Lacretia Nicks, MD [facility] Pager [email or web address]  If 7PM-7AM, please contact night-coverage [email or web address] Password [email or web address] 10/16/2019, 4:19 PM

## 2019-10-16 NOTE — TOC Progression Note (Signed)
Transition of Care [facility]) - Progression Note    Patient Details  Name: Maxwell Miller MRN: [id number] Date of Birth: 1961-07-04  Transition of Care [facility]) CM/SW [place], [place] Phone Number: 10/16/2019, 4:11 PM  Clinical Narrative:   CSW called [facility] again this afternoon, spoke with Arbie Cookey. CSW explained patient's situation and asked if they would be able to admit patient, and they have no long term care beds available at this time. CSW attempted to call [facility] several times today, left voicemails. CSW has received no call back from them. CSW received no call back from patient's niece. CSW attempted to call niece back again to discuss updates and ask about any other facilities to call, and had to leave a voicemail. CSW left a voicemail for niece with updates that there were no beds available and that the patient would likely have to return to [place]. CSW to await a call back from the niece. CSW updated MD with barriers to discharge. CSW to continue to follow.    Expected Discharge Plan: Skilled Nursing Facility Barriers to Discharge: Insurance Authorization  Expected Discharge Plan and Services Expected Discharge Plan: [place]         Expected Discharge Date: 09/28/19                                     Social Determinants of Health (SDOH) Interventions    Readmission Risk Interventions No flowsheet data found.

## 2019-10-16 NOTE — TOC Progression Note (Signed)
Transition of Care [facility]) - Progression Note    Patient Details  Name: Maxwell Miller MRN: [id number] Date of Birth: 23-Jun-1961  Transition of Care [facility]) CM/SW [place], [place] Phone Number: 10/16/2019, 12:00 PM  Clinical Narrative:   CSW reached out to [facility], left a voicemail for Admissions who was out at a meeting. CSW contacted [facility], and they do not have any long term care beds available for the patient. CSW attempted to contact patient's niece, Valla Leaver, to discuss update and left a voicemail. CSW to follow.    Expected Discharge Plan: Skilled Nursing Facility Barriers to Discharge: Insurance Authorization  Expected Discharge Plan and Services Expected Discharge Plan: [place]         Expected Discharge Date: 09/28/19                                     Social Determinants of Health (SDOH) Interventions    Readmission Risk Interventions No flowsheet data found.

## 2019-10-16 NOTE — Plan of Care (Signed)
Pt progressing in care, independent in ADLS, understanding of medication and treatment plan.

## 2019-10-17 DIAGNOSIS — U071 COVID-19: Secondary | ICD-10-CM | POA: Diagnosis not present

## 2019-10-17 DIAGNOSIS — J1289 Other viral pneumonia: Secondary | ICD-10-CM | POA: Diagnosis not present

## 2019-10-17 LAB — COMPREHENSIVE METABOLIC PANEL
ALT: 28 U/L (ref 0–44)
AST: 24 U/L (ref 15–41)
Albumin: 3.2 g/dL — ABNORMAL LOW (ref 3.5–5.0)
Alkaline Phosphatase: 71 U/L (ref 38–126)
Anion gap: 10 (ref 5–15)
BUN: 12 mg/dL (ref 6–20)
CO2: 27 mmol/L (ref 22–32)
Calcium: 8.9 mg/dL (ref 8.9–10.3)
Chloride: 102 mmol/L (ref 98–111)
Creatinine, Ser: 0.98 mg/dL (ref 0.61–1.24)
GFR calc Af Amer: >60 mL/min (ref >60)
GFR calc non Af Amer: >60 mL/min (ref >60)
Glucose, Bld: 111 mg/dL — ABNORMAL HIGH (ref 70–99)
Potassium: 3.7 mmol/L (ref 3.5–5.1)
Sodium: 139 mmol/L (ref 135–145)
Total Bilirubin: 0.6 mg/dL (ref 0.3–1.2)
Total Protein: 6.8 g/dL (ref 6.5–8.1)

## 2019-10-17 LAB — CBC WITH DIFFERENTIAL/PLATELET
Abs Immature Granulocytes: 0.02 10*3/uL (ref 0.00–0.07)
Basophils Absolute: 0 10*3/uL (ref 0.0–0.1)
Basophils Relative: 1 %
Eosinophils Absolute: 0.3 10*3/uL (ref 0.0–0.5)
Eosinophils Relative: 4 %
HCT: 45.5 % (ref 39.0–52.0)
Hemoglobin: 15.3 g/dL (ref 13.0–17.0)
Immature Granulocytes: 0 %
Lymphocytes Relative: 45 %
Lymphs Abs: 2.5 10*3/uL (ref 0.7–4.0)
MCH: 29 pg (ref 26.0–34.0)
MCHC: 33.6 g/dL (ref 30.0–36.0)
MCV: 86.3 fL (ref 80.0–100.0)
Monocytes Absolute: 0.8 10*3/uL (ref 0.1–1.0)
Monocytes Relative: 15 %
Neutro Abs: 2 10*3/uL (ref 1.7–7.7)
Neutrophils Relative %: 35 %
Platelets: 124 10*3/uL — ABNORMAL LOW (ref 150–400)
RBC: 5.27 MIL/uL (ref 4.22–5.81)
RDW: 15.3 % (ref 11.5–15.5)
WBC: 5.6 10*3/uL (ref 4.0–10.5)
nRBC: 0 % (ref 0.0–0.2)

## 2019-10-17 LAB — GLUCOSE, CAPILLARY
Glucose-Capillary: 106 mg/dL — ABNORMAL HIGH (ref 70–99)
Glucose-Capillary: 223 mg/dL — ABNORMAL HIGH (ref 70–99)
Glucose-Capillary: 98 mg/dL (ref 70–99)
Glucose-Capillary: 192 mg/dL — ABNORMAL HIGH (ref 70–99)

## 2019-10-17 NOTE — Progress Notes (Signed)
PROGRESS NOTE    Maxwell Miller  HYQ:[id number] DOB: 07-Mar-1961 DOA: 09/17/2019 PCP: Mable Paris, PA-C   Brief Narrative:  58 year old [place] [facility] resident with [name] history of vascular dementia, nontraumatic intracerebral hemorrhage, portal vein thrombosis on chronic Xarelto, and [name] diagnosis of COVID-19 10/28 who presented to the ED with 24 hours of severely elevated blood sugars, fever to 105, and altered mental status.  He has been successfully treated for his COVID-19 infection, he is at his baseline now which is pleasantly confused.  He awaits an negative Covid test for placement purposes.  Repeat test ordered on 10/13/2019.  This will be his third test.  Significant Events: 11/5 admit to [facility] ICU via [facility] long ED 11/6 transfer to progressive care unit  COVID-19 specific Treatment: Remdesivir 11/5 > Decadron 11/5 >  Assessment & Plan:   Active Problems:   Sepsis (HCC)   Malnutrition of moderate degree   Hypertension   Vascular dementia without behavioral disturbance (HCC)   Portal vein thrombosis   AKI (acute kidney injury) (HCC)   Dehydration   Respiratory failure with hypoxia (HCC)   Acute metabolic encephalopathy   Pneumonia due to COVID-19 virus  Pneumonia due to COVID-19 Patient presented with high fever with Latiqua Daloia temperature of 105.4 F.  He was encephalopathic.  He has completed course of remdesivir (11/9).  Completed steroids as well (11/16).  This problem has resolved clinically and he is symptom-free from the standpoint.  He's had 2 negative COVID 19 tests.  12/1 and 12/2.  Can come off of isolation after discharge.  COVID-19 Labs  No results for input(s): DDIMER, FERRITIN, LDH, CRP in the last 72 hours.  Lab Results  Component Value Date   SARSCOV2NAA NEGATIVE 10/14/2019   SARSCOV2NAA NEGATIVE 10/13/2019   SARSCOV2NAA POSITIVE ([name]) 10/07/2019   SARSCOV2NAA POSITIVE ([name]) 09/17/2019   Dizziness: pt c/o dizziness when standing (he notes  this has been present since he's been here, but Kirk Sampley bit difficult to get specifics from him).  No focal neuro deficit on exam.  Orthostatics negative.  Appreciate therapy eval at it appears that he did not have any recurrent vertigo while working with therapy.  Attempted head CT yesterday, but pt refused.  Based on no sx while working with therapy, no need for additional w/u at this time.  Continue to encourage OOB..  Will continue to monitor at this time.    SIRS/lactic acidosis Due to the above.  Now resolved.  Acute metabolic encephalopathy -Patient with underlying baseline vascular dementia at baseline. No acute findings on CT head.  This was thought to be mainly in the setting of acute COVID-19 infection, electrolyte derangement and hypernatremia.  Patient also was dehydrated and so he was given IV fluids.  He did have elevated ammonia level initially but this has normalized without intervention.  B12 and folate levels were normal.  He is back to his baseline with baseline dementia and pleasant confusion.  Hypomagnesemia - was replaced and now stable.  HTN - BP running high, have placed on moderate dose Coreg and full dose Norvasc.  Monitor and adjust.  As needed IV labetalol.  Hyperammonemia - Normalized without intervention.  History of portal vein thrombosis - Chronically on rivaroxaban.  Was on Lovenox here due to poor oral intake.  Now back on rivaroxaban.  Acute kidney injury - This was due to volume depletion and dehydration.  Resolved with IV fluids.    Polycythemia- This appears to be chronic.  Dysphagia - Resolved,  SLP input appreciated  Diabetes mellitus type 2, uncontrolled with hyperglycemia, new diagnosis - This appears to be Maxwell Miller new diagnosis for him.  Poor outpatient glycemic control due to hyperglycemia with A1c of 12.8.  Currently on Lantus and sliding scale will go to SNF on it.    DVT prophylaxis: xarelto Code Status: CPR, but no intubation Family  Communication: none at bedside Disposition Plan: pending SNF placement  Consultants:   PCCM  Procedures:   none  Antimicrobials: Anti-infectives (From admission, onward)   Start     Dose/Rate Route Frequency Ordered Stop   09/18/19 1000  remdesivir 100 mg in sodium chloride 0.9 % 250 mL IVPB     100 mg 500 mL/hr over 30 Minutes Intravenous Every 24 hours 09/17/19 1117 09/21/19 1055   09/17/19 1200  remdesivir 200 mg in sodium chloride 0.9 % 250 mL IVPB     200 mg 500 mL/hr over 30 Minutes Intravenous Once 09/17/19 1117 09/17/19 1249   09/17/19 0745  ceFEPIme (MAXIPIME) 2 g in sodium chloride 0.9 % 100 mL IVPB     2 g 200 mL/hr over 30 Minutes Intravenous  Once 09/17/19 0734 09/17/19 0837   09/17/19 0745  vancomycin (VANCOCIN) 1,750 mg in sodium chloride 0.9 % 500 mL IVPB     1,750 mg 250 mL/hr over 120 Minutes Intravenous  Once 09/17/19 0734 09/17/19 1104      Subjective: No new complaints  Objective: Vitals:   10/16/19 2056 10/17/19 0435 10/17/19 0812 10/17/19 1645  BP: 116/79 124/86 119/78 135/82  Pulse: 88 76 79 74  Resp: 17 18 18 14   Temp: 98.5 F (36.9 C) 98 F (36.7 C) 98.7 F (37.1 C) 98.6 F (37 C)  TempSrc: Oral Oral Oral Oral  SpO2: 99% 100% 95% 100%  Weight:      Height:        Intake/Output Summary (Last 24 hours) at 10/17/2019 1945 Last data filed at 10/17/2019 1600 Gross per 24 hour  Intake 840 ml  Output 1650 ml  Net -810 ml   Filed Weights   09/17/19 0822 09/17/19 2200  Weight: (S) 81.6 kg 98.4 kg    Examination:  General: No acute distress. Lungs: unlabored. Neurological: Alert. Moves all extremities 4 . Cranial nerves II through XII grossly intact. Skin: Warm and dry. No rashes or lesions. Extremities: No clubbing or cyanosis. No edema.   Data Reviewed: I have personally reviewed following labs and imaging studies  CBC: Recent Labs  Lab 10/11/19 0637 10/15/19 0248 10/17/19 0455  WBC 5.4 5.2 5.6  NEUTROABS  --  1.7 2.0   HGB 15.3 16.2 15.3  HCT 45.9 49.0 45.5  MCV 86.8 86.6 86.3  PLT 123* 129* 829*   Basic Metabolic Panel: Recent Labs  Lab 10/11/19 0637 10/11/19 1650 10/15/19 0248 10/17/19 0455  NA 135  --  137 139  K 3.9  --  4.0 3.7  CL 103  --  103 102  CO2 27  --  25 27  GLUCOSE 105*  --  99 111*  BUN 11  --  12 12  CREATININE 1.19  --  1.16 0.98  CALCIUM 8.5*  --  8.9 8.9  MG 1.6* 2.1 1.7  --    GFR: Estimated Creatinine Clearance: 98.2 mL/min (by C-G formula based on SCr of 0.98 mg/dL). Liver Function Tests: Recent Labs  Lab 10/11/19 0637 10/15/19 0248 10/17/19 0455  AST 24 22 24   ALT 27 32 28  ALKPHOS 62 79  71  BILITOT 0.8 0.6 0.6  PROT 6.7 7.3 6.8  ALBUMIN 3.1* 3.5 3.2*   No results for input(s): LIPASE, AMYLASE in the last 168 hours. No results for input(s): AMMONIA in the last 168 hours. Coagulation Profile: No results for input(s): INR, PROTIME in the last 168 hours. Cardiac Enzymes: No results for input(s): CKTOTAL, CKMB, CKMBINDEX, TROPONINI in the last 168 hours. BNP (last 3 results) No results for input(s): PROBNP in the last 8760 hours. HbA1C: No results for input(s): HGBA1C in the last 72 hours. CBG: Recent Labs  Lab 10/16/19 1205 10/16/19 2009 10/17/19 0752 10/17/19 1208 10/17/19 1647  GLUCAP 162* 154* 106* 223* 98   Lipid Profile: No results for input(s): CHOL, HDL, LDLCALC, TRIG, CHOLHDL, LDLDIRECT in the last 72 hours. Thyroid Function Tests: No results for input(s): TSH, T4TOTAL, FREET4, T3FREE, THYROIDAB in the last 72 hours. Anemia Panel: No results for input(s): VITAMINB12, FOLATE, FERRITIN, TIBC, IRON, RETICCTPCT in the last 72 hours. Sepsis Labs: No results for input(s): PROCALCITON, LATICACIDVEN in the last 168 hours.  Recent Results (from the past 240 hour(s))  SARS CORONAVIRUS 2 (TAT 6-24 HRS) Nasopharyngeal Nasopharyngeal Swab     Status: None   Collection Time: 10/13/19  9:29 AM   Specimen: Nasopharyngeal Swab  Result Value Ref  Range Status   SARS Coronavirus 2 NEGATIVE NEGATIVE Final    Comment: (NOTE) SARS-CoV-2 target nucleic acids are NOT DETECTED. The SARS-CoV-2 RNA is generally detectable in upper and lower respiratory specimens during the acute phase of infection. Negative results do not preclude SARS-CoV-2 infection, do not rule out co-infections with other pathogens, and should not be used as the sole basis for treatment or other patient management decisions. Negative results must be combined with clinical observations, patient history, and epidemiological information. The expected result is Negative. Fact Sheet for Patients: HairSlick.nohttps://www.fda.gov/media/138098/download Fact Sheet for Healthcare Providers: quierodirigir.comhttps://www.fda.gov/media/138095/download This test is not yet approved or cleared by the [place] FDA and  has been authorized for detection and/or diagnosis of SARS-CoV-2 by FDA under an Emergency Use Authorization (EUA). This EUA will remain  in effect (meaning this test can be used) for the duration of the COVID-19 declaration under Section 56 4(b)(1) of the Act, 21 U.S.C. section 360bbb-3(b)(1), unless the authorization is terminated or revoked sooner. Performed at [facility], [address] [place], [place] [address]   SARS CORONAVIRUS 2 (TAT 6-24 HRS) Nasopharyngeal Nasopharyngeal Swab     Status: None   Collection Time: 10/14/19  3:40 PM   Specimen: Nasopharyngeal Swab  Result Value Ref Range Status   SARS Coronavirus 2 NEGATIVE NEGATIVE Final    Comment: (NOTE) SARS-CoV-2 target nucleic acids are NOT DETECTED. The SARS-CoV-2 RNA is generally detectable in upper and lower respiratory specimens during the acute phase of infection. Negative results do not preclude SARS-CoV-2 infection, do not rule out co-infections with other pathogens, and should not be used as the sole basis for treatment or other patient management decisions. Negative results must be combined with  clinical observations, patient history, and epidemiological information. The expected result is Negative. Fact Sheet for Patients: HairSlick.nohttps://www.fda.gov/media/138098/download Fact Sheet for Healthcare Providers: quierodirigir.comhttps://www.fda.gov/media/138095/download This test is not yet approved or cleared by the [place] FDA and  has been authorized for detection and/or diagnosis of SARS-CoV-2 by FDA under an Emergency Use Authorization (EUA). This EUA will remain  in effect (meaning this test can be used) for the duration of the COVID-19 declaration under Section 56 4(b)(1) of the Act,  21 U.S.C. section 360bbb-3(b)(1), unless the authorization is terminated or revoked sooner. Performed at [facility], [address] [place], [place] [address]          Radiology Studies: No results found.      Scheduled Meds: . amLODipine  10 mg Oral Daily  . carvedilol  6.25 mg Oral BID WC  . famotidine  20 mg Oral Daily  . insulin aspart  0-15 Units Subcutaneous TID WC  . insulin aspart  0-5 Units Subcutaneous QHS  . insulin glargine  10 Units Subcutaneous Daily  . mouth rinse  15 mL Mouth Rinse BID  . multivitamin with minerals  1 tablet Oral Daily  . rivaroxaban  20 mg Oral Q supper  . thiamine  100 mg Oral Daily   Continuous Infusions:   LOS: 30 days    Time spent: over 30 min    Lacretia Nicks, MD [facility] Pager [email or web address]  If 7PM-7AM, please contact night-coverage [email or web address] Password [email or web address] 10/17/2019, 7:45 PM

## 2019-10-17 NOTE — TOC Progression Note (Addendum)
Transition of Care [facility]) - Progression Note    Patient Details  Name: Maxwell Miller MRN: [id number] Date of Birth: April 30, 1961  Transition of [facility]) CM/SW [place], [place] Phone Number: [phone] 10/17/2019, 12:10 PM  Clinical Narrative:     CSW attempted to called niece, no answer. CSW lvm for niece stating that patient would DC back to [place] today and to call CSW with any questions.  CSW called Asencion Partridge who is listed on patient's contact list. She reports she is patient's ex wife, CSW informed her we have been unable to reach patient's niece however patient is medically stable to discharge back to [place]. Asencion Partridge reports she is in agreement with this plan and would like to be informed once [facility] is called. She also requested [place] main number as she need to add his billing insurance to his file, CSW provided here with this information and answered all questions/concerns.    Expected Discharge Plan: Skilled Nursing Facility Barriers to Discharge: Insurance Authorization  Expected Discharge Plan and Services Expected Discharge Plan: [place]         Expected Discharge Date: 09/28/19                                     Social Determinants of Health (SDOH) Interventions    Readmission Risk Interventions No flowsheet data found.

## 2019-10-18 DIAGNOSIS — J1289 Other viral pneumonia: Secondary | ICD-10-CM | POA: Diagnosis not present

## 2019-10-18 DIAGNOSIS — U071 COVID-19: Secondary | ICD-10-CM | POA: Diagnosis not present

## 2019-10-18 LAB — GLUCOSE, CAPILLARY
Glucose-Capillary: 100 mg/dL — ABNORMAL HIGH (ref 70–99)
Glucose-Capillary: 180 mg/dL — ABNORMAL HIGH (ref 70–99)
Glucose-Capillary: 95 mg/dL (ref 70–99)

## 2019-10-18 MED ORDER — INSULIN ASPART 100 UNIT/ML ~~LOC~~ SOLN: 0.0000 [IU] | Freq: Three times a day (TID) | SUBCUTANEOUS | 11 refills | Status: DC

## 2019-10-18 MED ORDER — CARVEDILOL 6.25 MG PO TABS: 6.2500 mg | ORAL_TABLET | Freq: Two times a day (BID) | ORAL | 0 refills | Status: AC

## 2019-10-18 MED ORDER — INSULIN ASPART 100 UNIT/ML ~~LOC~~ SOLN: 0.0000 [IU] | Freq: Three times a day (TID) | SUBCUTANEOUS | 11 refills | Status: AC

## 2019-10-18 MED ORDER — INSULIN GLARGINE 100 UNIT/ML ~~LOC~~ SOLN: 10.0000 [IU] | Freq: Every day | SUBCUTANEOUS | 11 refills | Status: AC

## 2019-10-18 MED ORDER — INSULIN GLARGINE 100 UNIT/ML ~~LOC~~ SOLN: 10.0000 [IU] | Freq: Every day | SUBCUTANEOUS | 11 refills | Status: DC

## 2019-10-18 NOTE — Discharge Summary (Addendum)
Physician Discharge Summary  Maxwell Miller BMW:[id number] DOB: 06-06-1961 DOA: 09/17/2019  PCP: Ancil Boozer, PA-C  Admit date: 09/17/2019 Discharge date: 10/18/2019  Time spent: 40 minutes  Recommendations for Outpatient Follow-up:  1. Follow outpatient CBC/CMP 2. Follow thrombocytopenia outpatient 3. New diabetes - discharged with lantus and sliding scale insulin, follow and adjust as needed - sliding scale per discharge instructions below 4. Maxwell Miller has ended - pt with 2 negative COVID 19 tests 5. Of note, patient partial code - ok with CPR, no intubation - would continue to discuss with guardians - Maxwell Miller and Maxwell Miller outpatient.  Of note, was able to reach Maxwell Miller on 12/6.  Discussed code status and she confirmed partial CODE status with CPR, but no intubation.  No MOST form filled out today as I think this is best filled out with discussion with family and ability to take time to go over the choices on the form (not on short notice on the day of discharge - discussed this with Maxwell Miller).  Discussed need for transportation and that as he's ok with CPR, if transport is unable to take verbal order for CPR and no intubation or unable to take order as written in d/c summary, would need to transport him to [place] as full code.  Will need additional discussions at Maxwell Miller regarding code status.  Palliative care consult placed as outpatient.  Discharge Diagnoses:  Active Problems:   Sepsis ([place])   Malnutrition of moderate degree   Hypertension   Vascular dementia without behavioral disturbance (HCC)   Portal vein thrombosis   AKI (acute kidney injury) ([place])   Dehydration   Respiratory failure with hypoxia ([place])   Acute metabolic encephalopathy   Pneumonia due to COVID-19 virus   Discharge Condition: stable  Diet recommendation: heart healthy  Filed Weights   09/17/19 0822 09/17/19 2200  Weight: (S) 81.6 kg 98.4 kg    History of present illness:  58 year old  Maxwell Miller SNF resident with Maxwell Miller history of vascular dementia, nontraumatic intracerebral hemorrhage, portal vein thrombosis on chronic Xarelto, and Maxwell Miller diagnosis of COVID-19 10/28 who presented to the ED with 24 hours of severely elevated blood sugars, fever to 105, and altered mental status.He has been successfully treated for his COVID-19 infection, he is at his baseline now which is pleasantly confused. He awaits an negative Covid test for placement purposes.   Significant Events: 11/5 admit to [facility] ICU via [facility] long ED 11/6 transfer to progressive care unit  COVID-19 specific Treatment: Remdesivir 11/5 > Decadron 11/5 >  He was admitted for COVID 19 pneumonia and AMS.  He's improved after treatment.  He's being discharged on 12/6 to [place].  Hospital Course:  Pneumonia due to COVID-19 Patient presented with high fever with Mack Alvidrez temperature of 105.4 F. He was encephalopathic. He has completed course of remdesivir (11/9). Completed steroids as well (11/16). This problem has resolved clinically and he is symptom-free from the standpoint.  He's had 2 negative COVID 19 tests.  12/1 and 12/2.  Can come off of isolation after discharge.  COVID-19 Labs  No results for input(s): DDIMER, FERRITIN, LDH, CRP in the last 72 hours.  Lab Results  Component Value Date   SARSCOV2NAA NEGATIVE 10/14/2019   SARSCOV2NAA NEGATIVE 10/13/2019   SARSCOV2NAA POSITIVE (Farah Lepak) 10/07/2019   SARSCOV2NAA POSITIVE (Deshundra Waller) 09/17/2019    Dizziness: pt c/o dizziness when standing (he notes this has been present since he's been here, but Maxwell Miller bit difficult to get specifics from him).  No focal neuro deficit on exam.  Orthostatics negative.  Appreciate therapy eval at it appears that he did not have any recurrent vertigo while working with therapy.  Attempted head CT, but pt refused.  Based on no sx while working with therapy, no need for additional w/u at this time.  Continue to encourage OOB..  Will  continue to monitor at this time.  Work up further as needed outpatient if recurrent.  SIRS/lactic acidosis Due to the above. Now resolved.  Acute metabolic encephalopathy -Patient with underlying baseline vascular dementia at baseline. No acute findings on CT head. This was thought to be mainly in the setting of acute COVID-19 infection, electrolyte derangement and hypernatremia. Patient also was dehydrated and so he was given IV fluids. He did have elevated ammonia level initially but this has normalized without intervention. B12 and folate levels were normal. He is back to his baseline with baseline dementia and pleasant confusion.  Hypomagnesemia -was replaced and now stable.  HTN - BP running high, have placed on moderate dose Coreg and full dose Norvasc. Monitor and adjust. As needed IV labetalol.  Hyperammonemia - Normalized without intervention.  History of portal vein thrombosis - Chronically on rivaroxaban. Was on Lovenox here due to poor oral intake. Now back on rivaroxaban.  Acute kidney injury - This was due to volume depletion and dehydration. Resolved with IV fluids.   Polycythemia- This appears to be chronic.  Thrombocytopenia: follow outpatient   Dysphagia - Resolved, SLP input appreciated  Diabetes mellitus type 2, uncontrolled with hyperglycemia, new diagnosis - This appears to be Maxwell Miller new diagnosis for him. Poor outpatient glycemic control due to hyperglycemia with A1c of 12.8. Currently on Lantus and sliding scale will go to SNF on it.   Pneumonia due to COVID-19 Patient presented with high fever with Pessy Delamar temperature of 105.4 F. He was encephalopathic. He has completed course of remdesivir (11/9). Completed steroids as well (11/16). This problem has resolved clinically and he is symptom-free from the standpoint.  He's had 2 negative COVID 19 tests.  12/1 and 12/2.  Can come off of isolation after discharge.  COVID-19 Labs  No results  for input(s): DDIMER, FERRITIN, LDH, CRP in the last 72 hours.  Lab Results  Component Value Date   SARSCOV2NAA NEGATIVE 10/14/2019   SARSCOV2NAA NEGATIVE 10/13/2019   SARSCOV2NAA POSITIVE (Rashanna Christiana) 10/07/2019   SARSCOV2NAA POSITIVE (Maxwell Mclouth) 09/17/2019    Dizziness: pt c/o dizziness when standing (he notes this has been present since he's been here, but Maxwell Miller bit difficult to get specifics from him).  No focal neuro deficit on exam.  Orthostatics negative.  Appreciate therapy eval at it appears that he did not have any recurrent vertigo while working with therapy.  Attempted head CT yesterday, but pt refused.  Based on no sx while working with therapy, no need for additional w/u at this time.  Continue to encourage OOB..  Will continue to monitor at this time.    SIRS/lactic acidosis Due to the above. Now resolved.  Acute metabolic encephalopathy -Patient with underlying baseline vascular dementia at baseline. No acute findings on CT head. This was thought to be mainly in the setting of acute COVID-19 infection, electrolyte derangement and hypernatremia. Patient also was dehydrated and so he was given IV fluids. He did have elevated ammonia level initially but this has normalized without intervention. B12 and folate levels were normal. He is back to his baseline with baseline dementia and pleasant confusion.  Hypomagnesemia -was replaced and now  stable.  HTN - BP running high, have placed on moderate dose Coreg and full dose Norvasc. Monitor and adjust. As needed IV labetalol.  Hyperammonemia - Normalized without intervention.  History of portal vein thrombosis - Chronically on rivaroxaban. Was on Lovenox here due to poor oral intake. Now back on rivaroxaban.  Acute kidney injury - This was due to volume depletion and dehydration. Resolved with IV fluids.   Polycythemia- This appears to be chronic.  Dysphagia - Resolved, SLP input appreciated  Diabetes mellitus type 2,  uncontrolled with hyperglycemia, new diagnosis - This appears to be Maxwell Miller new diagnosis for him. Poor outpatient glycemic control due to hyperglycemia with A1c of 12.8. Currently on Lantus and sliding scale will go to SNF on it.   SSI with meals as noted below   CBG 70 - 120: 0 units   CBG 121 - 150: 2 units   CBG 151 - 200: 3 units   CBG 201 - 250: 5 units   CBG 251 - 300: 8 units   CBG 301 - 350: 11 units   CBG 351 - 400: 15 units    Procedures: none Consultations:  PCCM  Discharge Exam: Vitals:   10/18/19 0425 10/18/19 0739  BP: 117/78 113/70  Pulse: 86 79  Resp: 19 16  Temp: 99 F (37.2 C) 98.3 F (36.8 C)  SpO2: 99% 100%   No complaints Discussed with niece on 12/5  General: No acute distress. Cardiovascular: RRR Lungs: unlabored Abdomen: Soft, nontender, nondistended Neurological: Alert. Moves all extremities 4. Cranial nerves II through XII grossly intact. Skin: Warm and Miller. No rashes or lesions. Extremities: No clubbing or cyanosis. No edema.  Discharge Instructions   Discharge Instructions    Call MD for:  difficulty breathing, headache or visual disturbances   Complete by: As directed    Call MD for:  difficulty breathing, headache or visual disturbances   Complete by: As directed    Call MD for:  extreme fatigue   Complete by: As directed    Call MD for:  extreme fatigue   Complete by: As directed    Call MD for:  hives   Complete by: As directed    Call MD for:  persistant dizziness or light-headedness   Complete by: As directed    Call MD for:  persistant dizziness or light-headedness   Complete by: As directed    Call MD for:  persistant nausea and vomiting   Complete by: As directed    Call MD for:  persistant nausea and vomiting   Complete by: As directed    Call MD for:  redness, tenderness, or signs of infection (pain, swelling, redness, odor or green/yellow discharge around incision site)   Complete by: As directed    Call MD for:   severe uncontrolled pain   Complete by: As directed    Call MD for:  severe uncontrolled pain   Complete by: As directed    Call MD for:  temperature >100.4   Complete by: As directed    Discharge instructions   Complete by: As directed    Please review instructions on the discharge summary.  COVID 19 INSTRUCTIONS  - You are felt to be stable enough to no longer require inpatient monitoring, testing, and treatment, though you will need to follow the recommendations below: - Based on the CDC's non-test criteria for ending self-isolation: You may not return to work/leave the home until at least 21 days since symptom onset AND  3 days without Franki Stemen fever (without taking tylenol, ibuprofen, etc.) AND have improvement in respiratory symptoms. - Do not take NSAID medications (including, but not limited to, ibuprofen, advil, motrin, naproxen, aleve, goody's powder, etc.) - Follow up with your doctor in the next week via telehealth or seek medical attention right away if your symptoms get WORSE.  - Consider donating plasma after you have recovered (either 14 days after Yoshi Vicencio negative test or 28 days after symptoms have completely resolved) because your antibodies to this virus may be helpful to give to others with life-threatening infections. Please go to the website www.[email or web address] if you would like to consider volunteering for plasma donation.    Directions for you at home:  Wear Janely Gullickson facemask You should wear Aniyah Nobis facemask that covers your nose and mouth when you are in the same room with other people and when you visit Felder Lebeda healthcare provider. People who live with or visit you should also wear Prisila Dlouhy facemask while they are in the same room with you.  Separate yourself from other people in your home As much as possible, you should stay in Jennalee Greaves different room from other people in your home. Also, you should use Piper Hassebrock separate bathroom, if available.  Avoid sharing household items You should not share dishes, drinking  glasses, cups, eating utensils, towels, bedding, or other items with other people in your home. After using these items, you should wash them thoroughly with soap and water.  Cover your coughs and sneezes Cover your mouth and nose with Carita Sollars tissue when you cough or sneeze, or you can cough or sneeze into your sleeve. Throw used tissues in Christorpher Hisaw lined trash can, and immediately wash your hands with soap and water for at least 20 seconds or use an alcohol-based hand rub.  Wash your [employer] your hands often and thoroughly with soap and water for at least 20 seconds. You can use an alcohol-based hand sanitizer if soap and water are not available and if your hands are not visibly dirty. Avoid touching your eyes, nose, and mouth with unwashed hands.  Directions for those who live with, or provide care at home for you:  Limit the number of people who have contact with the patient If possible, have only one caregiver for the patient. Other household members should stay in another home or place of residence. If this is not possible, they should stay in another room, or be separated from the patient as much as possible. Use Jessen Siegman separate bathroom, if available. Restrict visitors who do not have an essential need to be in the home.  Ensure good ventilation Make sure that shared spaces in the home have good air flow, such as from an air conditioner or an opened window, weather permitting.  Wash your hands often Wash your hands often and thoroughly with soap and water for at least 20 seconds. You can use an alcohol based hand sanitizer if soap and water are not available and if your hands are not visibly dirty. Avoid touching your eyes, nose, and mouth with unwashed hands. Use disposable paper towels to Miller your hands. If not available, use dedicated cloth towels and replace them when they become wet.  Wear Febe Champa facemask and gloves Wear Oviya Ammar disposable facemask at all times in the room and gloves when you touch  or have contact with the patient's blood, body fluids, and/or secretions or excretions, such as sweat, saliva, sputum, nasal mucus, vomit, urine, or feces.  Ensure the mask fits over  your nose and mouth tightly, and do not touch it during use. Throw out disposable facemasks and gloves after using them. Do not reuse. Wash your hands immediately after removing your facemask and gloves. If your personal clothing becomes contaminated, carefully remove clothing and launder. Wash your hands after handling contaminated clothing. Place all used disposable facemasks, gloves, and other waste in Preslynn Bier lined container before disposing them with other household waste. Remove gloves and wash your hands immediately after handling these items.  Do not share dishes, glasses, or other household items with the patient Avoid sharing household items. You should not share dishes, drinking glasses, cups, eating utensils, towels, bedding, or other items with Rai Severns patient who is confirmed to have, or being evaluated for, COVID-19 infection. After the person uses these items, you should wash them thoroughly with soap and water.  Wash laundry thoroughly Immediately remove and wash clothes or bedding that have blood, body fluids, and/or secretions or excretions, such as sweat, saliva, sputum, nasal mucus, vomit, urine, or feces, on them. Wear gloves when handling laundry from the patient. Read and follow directions on labels of laundry or clothing items and detergent. In general, wash and Miller with the warmest temperatures recommended on the label.  Clean all areas the individual has used often Clean all touchable surfaces, such as counters, tabletops, doorknobs, bathroom fixtures, toilets, phones, keyboards, tablets, and bedside tables, every day. Also, clean any surfaces that may have blood, body fluids, and/or secretions or excretions on them. Wear gloves when cleaning surfaces the patient has come in contact with. Use Gerritt Galentine diluted  bleach solution (e.g., dilute bleach with 1 part bleach and 10 parts water) or Reia Viernes household disinfectant with Yuki Brunsman label that says EPA-registered for coronaviruses. To make Tannar Broker bleach solution at home, add 1 tablespoon of bleach to 1 quart (4 cups) of water. For Simara Rhyner larger supply, add  cup of bleach to 1 gallon (16 cups) of water. Read labels of cleaning products and follow recommendations provided on product labels. Labels contain instructions for safe and effective use of the cleaning product including precautions you should take when applying the product, such as wearing gloves or eye protection and making sure you have good ventilation during use of the product. Remove gloves and wash hands immediately after cleaning.  Monitor yourself for signs and symptoms of illness Caregivers and household members are considered close contacts, should monitor their health, and will be asked to limit movement outside of the home to the extent possible. Follow the monitoring steps for close contacts listed on the symptom monitoring form.   If you have additional questions, contact your local health department or call the epidemiologist on call at [phone] (available 24/7). This guidance is subject to change. For the most up-to-date guidance from [facility], please refer to their website: TripMetro.hu   You were cared for by Maxwell Miller hospitalist during your hospital stay. If you have any questions about your discharge medications or the care you received while you were in the hospital after you are discharged, you can call the unit and asked to speak with the hospitalist on call if the hospitalist that took care of you is not available. Once you are discharged, your primary care physician will handle any further medical issues. Please note that NO REFILLS for any discharge medications will be authorized once you are discharged, as it is imperative that you return to  your primary care physician (or establish Tyshay Adee relationship with Maxwell Miller primary care physician if you do not  have one) for your aftercare needs so that they can reassess your need for medications and monitor your lab values. If you do not have Maxwell Miller primary care physician, you can call [phone] for Paysley Poplar physician referral.   Discharge instructions   Complete by: As directed    You were seen for COVID 19 pneumonia.  You've improved with steroids and remdesivir.  Your quarantine period for COVID 19 can be discontinued.  You've had 2 negative COVID 19 tests.  You were diagnosed with diabetes.  We'll send you home on lantus and sliding scale insulin with meals.  Sliding scale per protocol below.  Continue to adjust as needed. For blood sugar 70 - 120: 0 units For blood sugar 121 - 150: 2 units For blood sugar 151 - 200: 3 units For blood sugar 201 - 250: 5 units For blood sugar 251 - 300: 8 units For blood sugar 301 - 350: 11 units For blood sugar 351 - 400: 15 units  Return for new, recurrent, or worsening symptoms.  Please ask your PCP to request records from this hospitalization so they know what was done and what the next steps will be.   Increase activity slowly   Complete by: As directed      Allergies as of 10/18/2019   No Known Allergies     Medication List    STOP taking these medications   clonazePAM 1 MG tablet Commonly known as: KLONOPIN   mirtazapine 15 MG disintegrating tablet Commonly known as: REMERON SOL-TAB   OXYGEN     TAKE these medications   acetaminophen 325 MG tablet Commonly known as: TYLENOL Take 650 mg by mouth every 6 (six) hours as needed for mild pain or fever.   amLODipine 5 MG tablet Commonly known as: NORVASC Take 1 tablet (5 mg total) by mouth daily. What changed:   medication strength  how much to take   atorvastatin 20 MG tablet Commonly known as: LIPITOR Take 20 mg by mouth at bedtime.   carvedilol 6.25 MG tablet Commonly known as: COREG Take  1 tablet (6.25 mg total) by mouth 2 (two) times daily with Maxwell Miller meal.   cholecalciferol 25 MCG (1000 UT) tablet Commonly known as: VITAMIN D3 Take 1,000 Units by mouth daily.   cloNIDine 0.1 MG tablet Commonly known as: CATAPRES Take 0.1 mg by mouth every 8 (eight) hours as needed. SBP above 160 or DBP above 100   docusate sodium 100 MG capsule Commonly known as: COLACE Take 100 mg by mouth daily.   insulin aspart 100 UNIT/ML injection Commonly known as: novoLOG Inject 0-15 Units into the skin 3 (three) times daily with meals. CBG 70 - 120: 0 units CBG 121 - 150: 2 units CBG 151 - 200: 3 units CBG 201 - 250: 5 units CBG 251 - 300: 8 units CBG 301 - 350: 11 units CBG 351 - 400: 15 units   insulin glargine 100 UNIT/ML injection Commonly known as: LANTUS Inject 0.1 mLs (10 Units total) into the skin daily.   metoprolol tartrate 25 MG tablet Commonly known as: LOPRESSOR Take 1 tablet (25 mg total) by mouth 2 (two) times daily.   multivitamin with minerals Tabs tablet Take 1 tablet by mouth daily.   tamsulosin 0.4 MG Caps capsule Commonly known as: FLOMAX Take 1 capsule (0.4 mg total) by mouth daily. What changed: when to take this   venlafaxine XR 150 MG 24 hr capsule Commonly known as: EFFEXOR-XR Take 150 mg by mouth  daily with breakfast.   Xarelto 20 MG Tabs tablet Generic drug: rivaroxaban Take 20 mg by mouth daily with supper.      No Known Allergies  Contact information for follow-up providers    Mable Paris, PA-C. Schedule an appointment as soon as possible for Jalexia Lalli visit in 1 week(s).   Specialty: Family Medicine Contact information: [address] [place] [address] [phone]            Contact information for after-discharge care    Destination    [facility] .   Service: Skilled Nursing Contact information: [address] [place] [address] [phone]                    The results of significant diagnostics from this hospitalization (including imaging, microbiology, ancillary and laboratory) are listed below for reference.    Significant Diagnostic Studies: No results found.  Microbiology: Recent Results (from the past 240 hour(s))  SARS CORONAVIRUS 2 (TAT 6-24 HRS) Nasopharyngeal Nasopharyngeal Swab     Status: None   Collection Time: 10/13/19  9:29 AM   Specimen: Nasopharyngeal Swab  Result Value Ref Range Status   SARS Coronavirus 2 NEGATIVE NEGATIVE Final    Comment: (NOTE) SARS-CoV-2 target nucleic acids are NOT DETECTED. The SARS-CoV-2 RNA is generally detectable in upper and lower respiratory specimens during the acute phase of infection. Negative results do not preclude SARS-CoV-2 infection, do not rule out co-infections with other pathogens, and should not be used as the sole basis for treatment or other patient management decisions. Negative results must be combined with clinical observations, patient history, and epidemiological information. The expected result is Negative. Fact Sheet for Patients: HairSlick.no Fact Sheet for Healthcare Providers: quierodirigir.com This test is not yet approved or cleared by the [place] FDA and  has been authorized for detection and/or diagnosis of SARS-CoV-2 by FDA under an Emergency Use Authorization (EUA). This EUA will remain  in effect (meaning this test can be used) for the duration of the COVID-19 declaration under Section 56 4(b)(1) of the Act, 21 U.S.C. section 360bbb-3(b)(1), unless the authorization is terminated or revoked sooner. Performed at [facility], [address], [place], [place] [address]   SARS CORONAVIRUS 2 (TAT 6-24 HRS) Nasopharyngeal Nasopharyngeal Swab     Status: None   Collection Time: 10/14/19  3:40 PM   Specimen: Nasopharyngeal Swab  Result Value Ref Range Status   SARS Coronavirus 2  NEGATIVE NEGATIVE Final    Comment: (NOTE) SARS-CoV-2 target nucleic acids are NOT DETECTED. The SARS-CoV-2 RNA is generally detectable in upper and lower respiratory specimens during the acute phase of infection. Negative results do not preclude SARS-CoV-2 infection, do not rule out co-infections with other pathogens, and should not be used as the sole basis for treatment or other patient management decisions. Negative results must be combined with clinical observations, patient history, and epidemiological information. The expected result is Negative. Fact Sheet for Patients: [employer] Fact Sheet for Healthcare Providers: quierodirigir.com This test is not yet approved or cleared by the [place] FDA and  has been authorized for detection and/or diagnosis of SARS-CoV-2 by FDA under an Emergency Use Authorization (EUA). This EUA will remain  in effect (meaning this test can be used) for the duration of the COVID-19 declaration under Section 56 4(b)(1) of the Act, 21 U.S.C. section 360bbb-3(b)(1), unless the authorization is terminated or revoked sooner. Performed at [facility]  [facility], [address] [place], [place] [address]      Labs: Basic Metabolic Panel: Recent Labs  Lab 10/11/19 1650 10/15/19 0248 10/17/19 0455  NA  --  137 139  K  --  4.0 3.7  CL  --  103 102  CO2  --  25 27  GLUCOSE  --  99 111*  BUN  --  12 12  CREATININE  --  1.16 0.98  CALCIUM  --  8.9 8.9  MG 2.1 1.7  --    Liver Function Tests: Recent Labs  Lab 10/15/19 0248 10/17/19 0455  AST 22 24  ALT 32 28  ALKPHOS 79 71  BILITOT 0.6 0.6  PROT 7.3 6.8  ALBUMIN 3.5 3.2*   No results for input(s): LIPASE, AMYLASE in the last 168 hours. No results for input(s): AMMONIA in the last 168 hours. CBC: Recent Labs  Lab 10/15/19 0248 10/17/19 0455  WBC 5.2 5.6  NEUTROABS 1.7 2.0  HGB 16.2 15.3  HCT 49.0 45.5  MCV 86.6 86.3  PLT  129* 124*   Cardiac Enzymes: No results for input(s): CKTOTAL, CKMB, CKMBINDEX, TROPONINI in the last 168 hours. BNP: BNP (last 3 results) No results for input(s): BNP in the last 8760 hours.  ProBNP (last 3 results) No results for input(s): PROBNP in the last 8760 hours.  CBG: Recent Labs  Lab 10/17/19 1208 10/17/19 1647 10/17/19 2021 10/18/19 0733 10/18/19 1150  GLUCAP 223* 98 192* 100* 180*       Signed:  Lacretia Nicksaldwell Powell MD.  [facility] 10/18/2019, 1:26 PM

## 2019-10-18 NOTE — Progress Notes (Signed)
Discharge to [facility] via [place]. Phone report given to [place] at the receiving facility. 5pm meds given, PIV removed. Will be full code during transport.

## 2019-10-18 NOTE — TOC Progression Note (Signed)
Transition of Care [facility]) - Progression Note    Patient Details  Name: Maxwell Miller MRN: [id number] Date of Birth: January 24, 1961  Transition of Care [facility]) CM/SW [place], [place] Phone Number: 10/18/2019, 11:05 AM  Clinical Narrative:   CSW spoke with patient's niece, Myrna, to update her on barriers to finding a new long term placement. Myrna understanding of difficulties and said that she knows he can't stay in the hospital, and she wouldn't want him to still be in the hospital for Christmas. Patient to discharge back to [facility] today.    Expected Discharge Plan: Skilled Nursing Facility Barriers to Discharge: Insurance Authorization  Expected Discharge Plan and Services Expected Discharge Plan: [place]         Expected Discharge Date: 09/28/19                                     Social Determinants of Health (SDOH) Interventions    Readmission Risk Interventions No flowsheet data found.

## 2019-10-18 NOTE — Plan of Care (Signed)
See progress note for discharge.

## 2019-10-18 NOTE — TOC Transition Note (Addendum)
Transition of Care [facility]) - CM/SW Discharge Note   Patient Details  Name: Maxwell Miller MRN: [id number] Date of Birth: 12-Dec-1960  Transition of Care [facility]) CM/SW Contact:  Geralynn Ochs, LCSW Phone Number: 10/18/2019, 1:36 PM   Clinical Narrative:   Nurse to call report to [phone], Room 121.  RN to set up transport, per RN request.    Final next level of care: Skilled Nursing Facility Barriers to Discharge: Barriers Resolved   Patient Goals and CMS Choice   CMS Medicare.gov Compare Post Acute Care list provided to:: Patient Represenative (must comment) Choice offered to / list presented to : [place] / [place]  Discharge Placement              Patient chooses bed at: [facility]) Patient to be transferred to facility by: [place] Name of family member notified: Myrna Patient and family notified of of transfer: 10/18/19  Discharge Plan and Services                                     Social Determinants of Health (SDOH) Interventions     Readmission Risk Interventions No flowsheet data found.

## 2019-10-19 LAB — GLUCOSE, CAPILLARY: Glucose-Capillary: 158 mg/dL — ABNORMAL HIGH (ref 70–99)

## 2021-03-08 IMAGING — MR MRI HEAD WITHOUT CONTRAST
12 series · 43 of 48 positions shown · non-contrast
Comparison: CT head 01/20/2019.

CLINICAL DATA: History of BILATERAL thalamic hemorrhages.
Encephalopathy, weakness, confusion, and fever. Sepsis.

EXAM:
MRI HEAD WITHOUT CONTRAST
TECHNIQUE: Multiplanar, multiecho pulse sequences of the brain and surrounding
structures were obtained without intravenous contrast.

[Series 5: ax dwi_tracew · axial · 3.0mm · 0.60mm/px · z∈[-86,+73]mm · 4 of 55 slices shown]
[im 1/55]
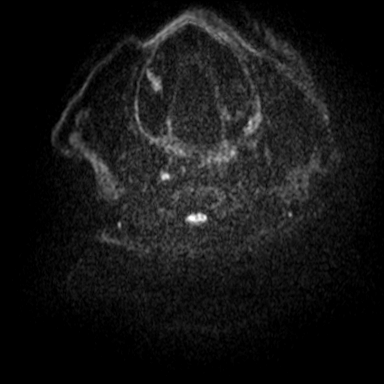
[im 19/55]
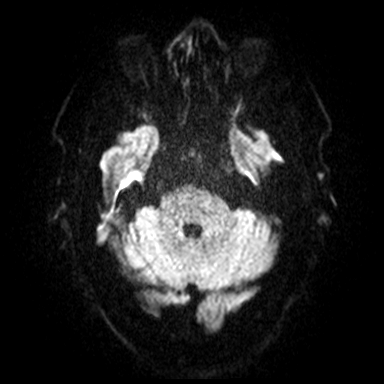
[im 37/55]
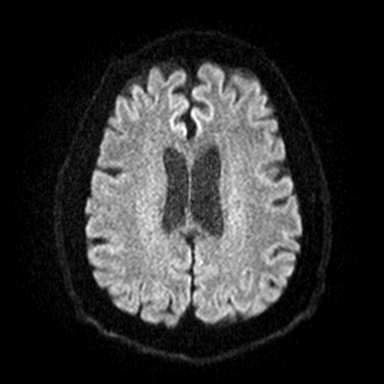
[im 55/55]
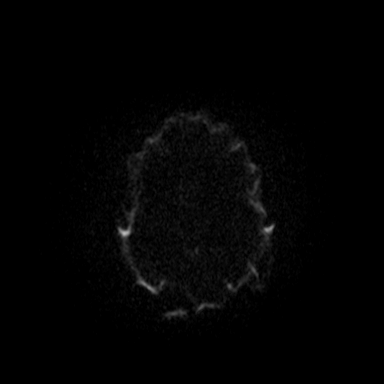

[Series 6: ax dwi_adc · axial · 3.0mm · 0.60mm/px · z∈[-86,+73]mm · 4 of 55 slices shown]
[im 1/55]
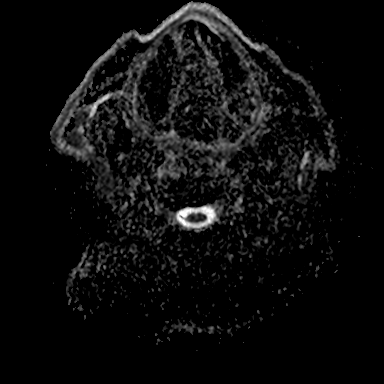
[im 19/55]
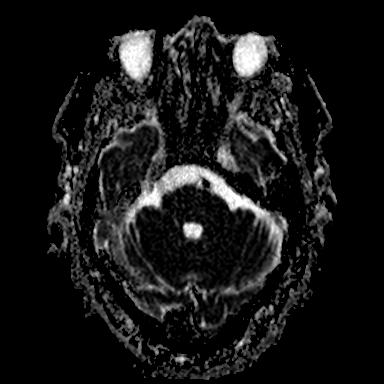
[im 37/55]
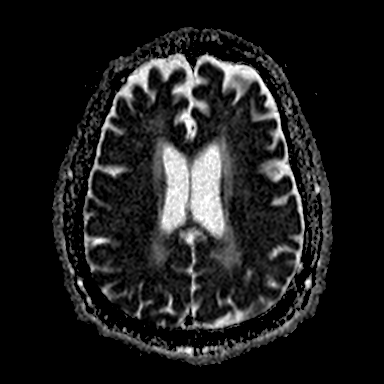
[im 55/55]
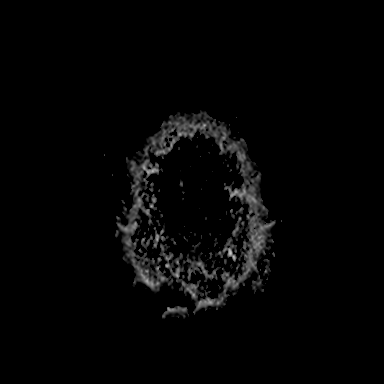

[Series 7: cor dwi_tracew · coronal · 5.0mm · 0.60mm/px · 3 of 45 slices shown]
[im 1/45]
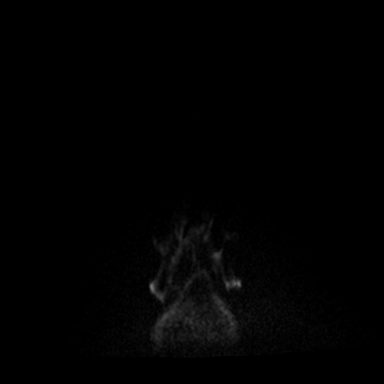
[im 23/45]
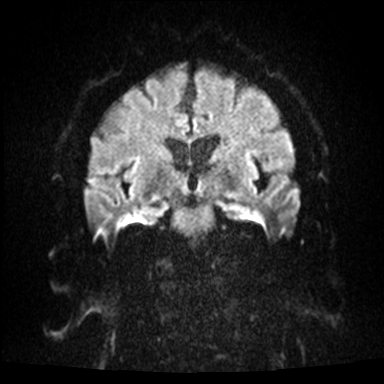
[im 45/45]
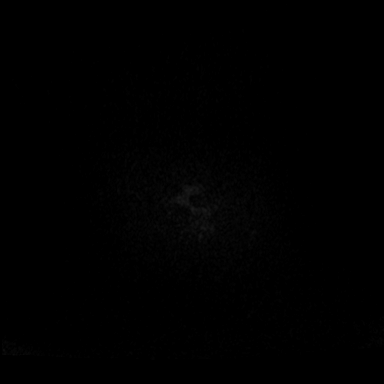

[Series 8: cor dwi_adc · coronal · 5.0mm · 0.60mm/px · 3 of 45 slices shown]
[im 1/45]
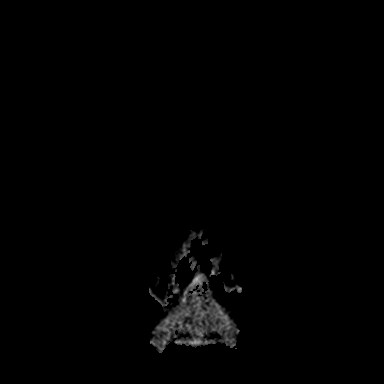
[im 23/45]
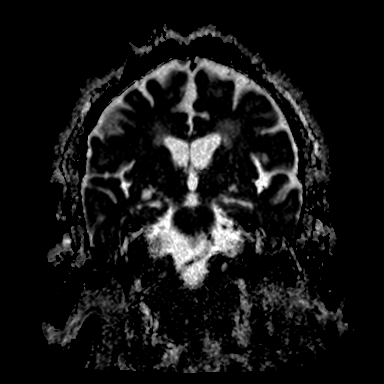
[im 45/45]
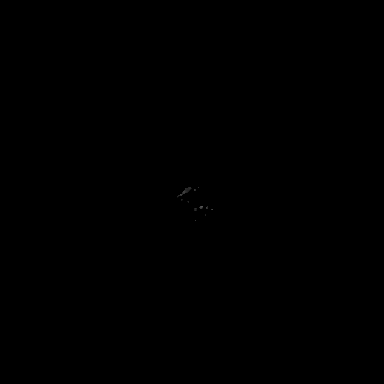

[Series 9: T1 · sagittal · 5.0mm · 0.62mm/px · 2 of 24 slices shown (1 of 2)]
[im 1/24]
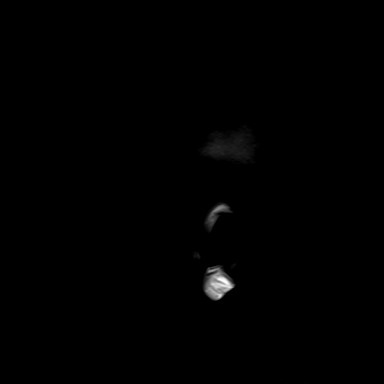
[im 24/24]
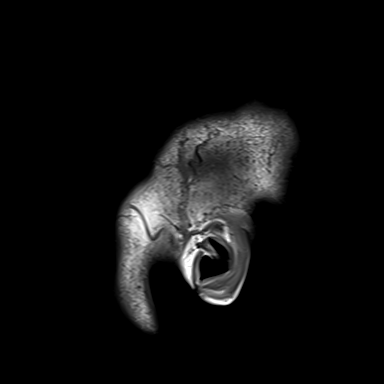

[Series 10: T2 · axial · 5.0mm · 0.53mm/px · z∈[-81,+72]mm · 2 of 27 slices shown (1 of 2)]
[im 1/27]
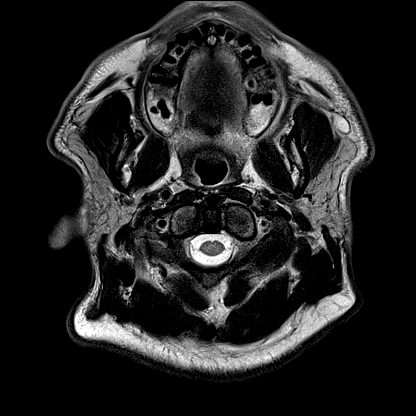
[im 27/27]
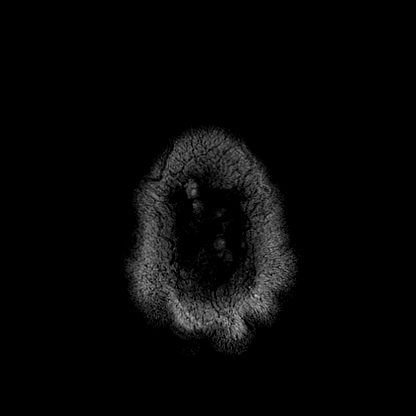

[Series 11: mag_images · axial · 3.0mm · 0.90mm/px · z∈[-92,+82]mm · 4 of 60 slices shown]
[im 1/60]
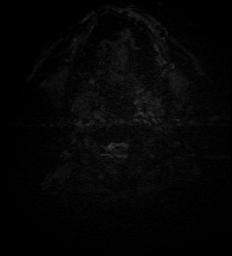
[im 20/60]
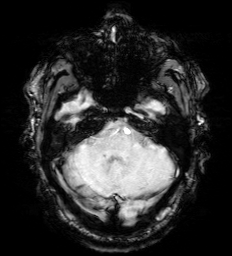
[im 40/60]
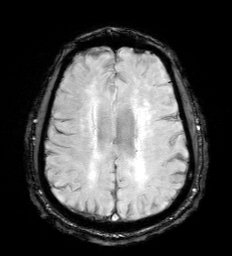
[im 60/60]
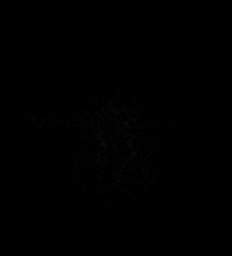

[Series 12: pha_images · axial · 3.0mm · 0.90mm/px · z∈[-92,+79]mm · 4 of 59 slices shown]
[im 1/59]
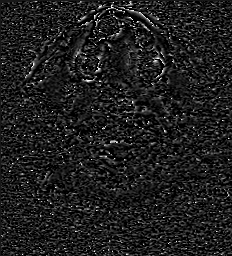
[im 20/59]
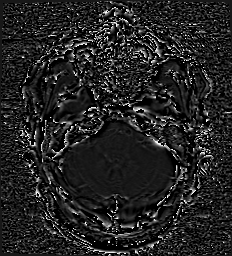
[im 39/59]
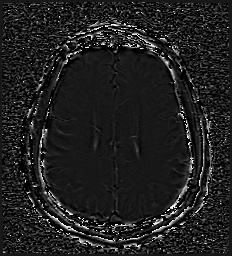
[im 59/59]
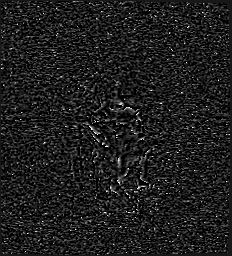

[Series 13: swi_images · axial · 3.0mm · 0.90mm/px · z∈[-92,+23]mm · 3 of 60 slices shown]
[im 1/60]
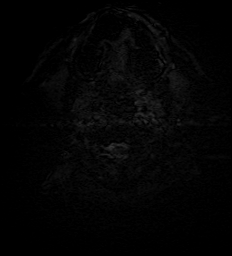
[im 20/60]
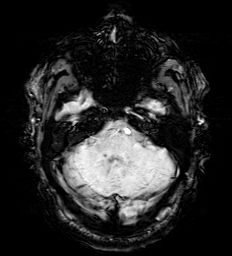
[im 40/60]
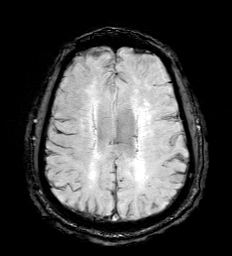

[Series 15: FLAIR · axial · 3.0mm · 0.53mm/px · z∈[-84,+75]mm · 4 of 55 slices shown]
[im 1/55]
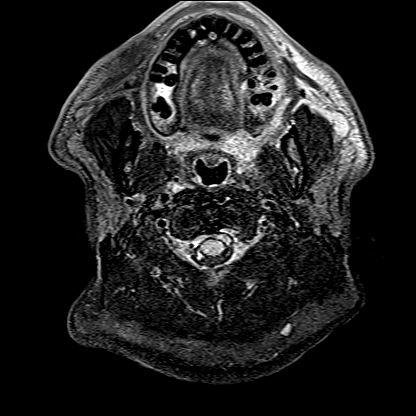
[im 19/55]
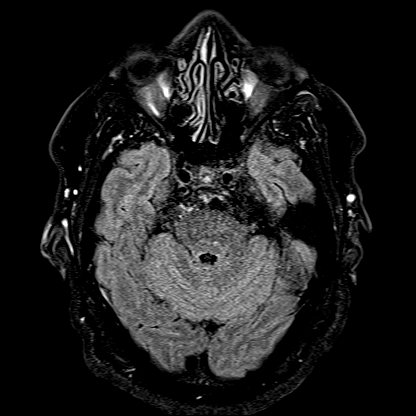
[im 37/55]
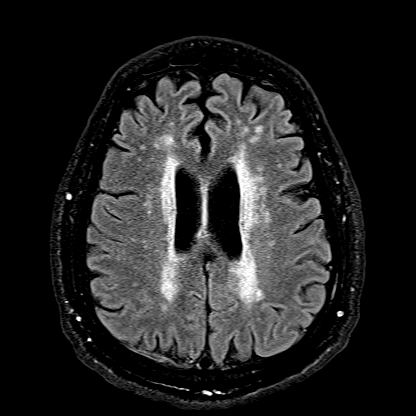
[im 55/55]
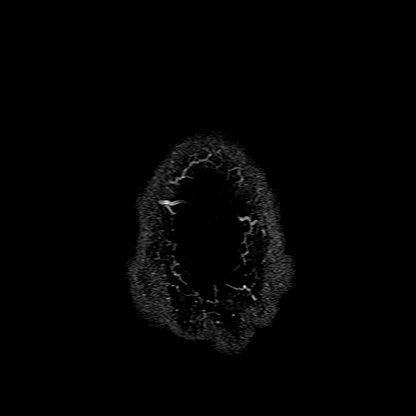

[Series 16: T1 · axial · 1.0mm · 0.98mm/px · z∈[-89,+83]mm · 8 of 176 slices shown (2 of 2)]
[im 1/176]
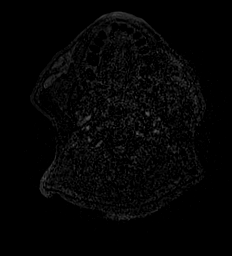
[im 32/176]
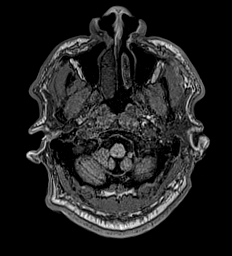
[im 48/176]
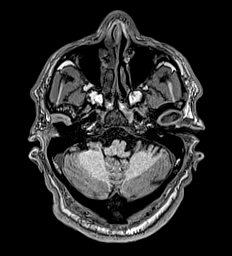
[im 80/176]
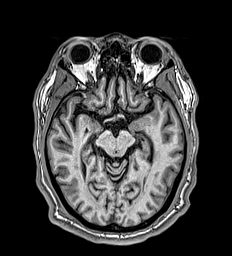
[im 96/176]
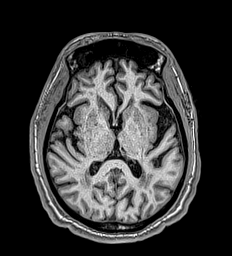
[im 128/176]
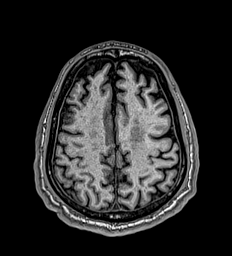
[im 144/176]
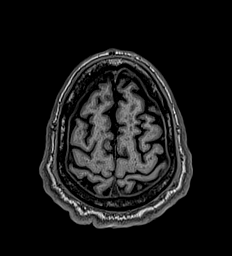
[im 176/176]
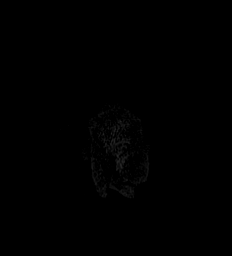

[Series 17: T2 · coronal · 5.0mm · 0.57mm/px · 2 of 29 slices shown (2 of 2)]
[im 1/29]
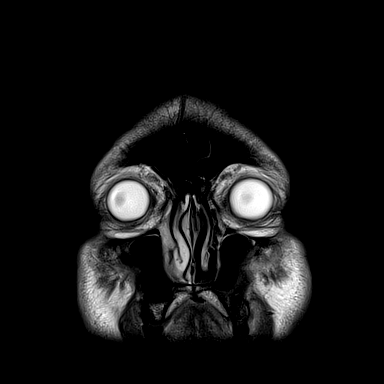
[im 29/29]
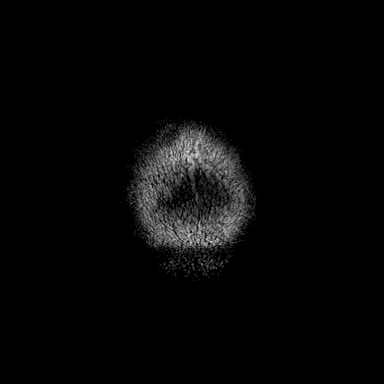

[43 of 48 positions shown; findings below may reference images not displayed]

FINDINGS: Brain: Susceptibility imaging demonstrates chronic BILATERAL
thalamic hemorrhages, with subependymal extension, and regional
encephalomalacia. This is greater on the LEFT. No features of
communicating hydrocephalus. Mild atrophy with chronic microvascular
ischemic change, premature for age.

No acute stroke, acute hemorrhage, or extra-axial fluid collections.
No features concerning for meningitis, within limits for assessment
on this noncontrast exam.

Prominent perivascular spaces simulate lacunes, and likely represent
additional sequelae of hypertension.

Vascular: Flow voids are maintained.  Dolichoectasia.

Skull and upper cervical spine: Unremarkable.

Sinuses/Orbits: No sinus disease.  Negative orbits.

Other: None.
IMPRESSION: Chronic BILATERAL thalamic hemorrhages are most consistent with
prior hypertensive insults. No evidence of communicating
hydrocephalus.

No evidence of acute bleed, acute infarction, or features suggestive
of meningitis.
